# Patient Record
Sex: Female | Born: 1965 | Race: White | Hispanic: No | Marital: Married | Smoking: Former smoker
Health system: Southern US, Community
[De-identification: ages and names within clinical notes are randomized; demographics above are authoritative.]

## PROBLEM LIST (undated history)

## (undated) DIAGNOSIS — N950 Postmenopausal bleeding: Secondary | ICD-10-CM

## (undated) DIAGNOSIS — F411 Generalized anxiety disorder: Secondary | ICD-10-CM

## (undated) DIAGNOSIS — I471 Supraventricular tachycardia, unspecified: Secondary | ICD-10-CM

## (undated) DIAGNOSIS — R0609 Other forms of dyspnea: Secondary | ICD-10-CM

## (undated) DIAGNOSIS — Z9289 Personal history of other medical treatment: Secondary | ICD-10-CM

## (undated) DIAGNOSIS — Z7901 Long term (current) use of anticoagulants: Secondary | ICD-10-CM

## (undated) DIAGNOSIS — I10 Essential (primary) hypertension: Secondary | ICD-10-CM

## (undated) DIAGNOSIS — K760 Fatty (change of) liver, not elsewhere classified: Secondary | ICD-10-CM

## (undated) DIAGNOSIS — R7303 Prediabetes: Secondary | ICD-10-CM

## (undated) DIAGNOSIS — R112 Nausea with vomiting, unspecified: Secondary | ICD-10-CM

## (undated) DIAGNOSIS — M545 Low back pain, unspecified: Secondary | ICD-10-CM

## (undated) DIAGNOSIS — Z85828 Personal history of other malignant neoplasm of skin: Secondary | ICD-10-CM

## (undated) DIAGNOSIS — Z860101 Personal history of adenomatous and serrated colon polyps: Secondary | ICD-10-CM

## (undated) DIAGNOSIS — Z86010 Personal history of colonic polyps: Secondary | ICD-10-CM

## (undated) DIAGNOSIS — I480 Paroxysmal atrial fibrillation: Secondary | ICD-10-CM

## (undated) DIAGNOSIS — F329 Major depressive disorder, single episode, unspecified: Secondary | ICD-10-CM

## (undated) DIAGNOSIS — J439 Emphysema, unspecified: Secondary | ICD-10-CM

## (undated) DIAGNOSIS — R079 Chest pain, unspecified: Secondary | ICD-10-CM

## (undated) DIAGNOSIS — R600 Localized edema: Secondary | ICD-10-CM

## (undated) DIAGNOSIS — E041 Nontoxic single thyroid nodule: Secondary | ICD-10-CM

## (undated) DIAGNOSIS — K635 Polyp of colon: Secondary | ICD-10-CM

## (undated) DIAGNOSIS — F419 Anxiety disorder, unspecified: Secondary | ICD-10-CM

## (undated) DIAGNOSIS — Z9889 Other specified postprocedural states: Secondary | ICD-10-CM

## (undated) DIAGNOSIS — G8929 Other chronic pain: Secondary | ICD-10-CM

## (undated) DIAGNOSIS — R911 Solitary pulmonary nodule: Secondary | ICD-10-CM

## (undated) DIAGNOSIS — M1990 Unspecified osteoarthritis, unspecified site: Secondary | ICD-10-CM

## (undated) DIAGNOSIS — I5032 Chronic diastolic (congestive) heart failure: Secondary | ICD-10-CM

## (undated) DIAGNOSIS — F32A Depression, unspecified: Secondary | ICD-10-CM

## (undated) DIAGNOSIS — M4316 Spondylolisthesis, lumbar region: Secondary | ICD-10-CM

## (undated) DIAGNOSIS — I519 Heart disease, unspecified: Secondary | ICD-10-CM

## (undated) DIAGNOSIS — M5136 Other intervertebral disc degeneration, lumbar region: Secondary | ICD-10-CM

## (undated) DIAGNOSIS — G901 Familial dysautonomia [Riley-Day]: Secondary | ICD-10-CM

## (undated) DIAGNOSIS — M51369 Other intervertebral disc degeneration, lumbar region without mention of lumbar back pain or lower extremity pain: Secondary | ICD-10-CM

## (undated) DIAGNOSIS — C4491 Basal cell carcinoma of skin, unspecified: Secondary | ICD-10-CM

## (undated) HISTORY — PX: ENDOMETRIAL ABLATION: SHX621

## (undated) HISTORY — DX: Depression, unspecified: F32.A

## (undated) HISTORY — DX: Prediabetes: R73.03

## (undated) HISTORY — DX: Unspecified osteoarthritis, unspecified site: M19.90

## (undated) HISTORY — PX: TUBAL LIGATION: SHX77

## (undated) HISTORY — DX: Nontoxic single thyroid nodule: E04.1

## (undated) HISTORY — DX: Anxiety disorder, unspecified: F41.9

## (undated) HISTORY — DX: Chest pain, unspecified: R07.9

## (undated) HISTORY — DX: Heart disease, unspecified: I51.9

## (undated) HISTORY — DX: Personal history of other medical treatment: Z92.89

## (undated) HISTORY — DX: Polyp of colon: K63.5

## (undated) HISTORY — DX: Familial dysautonomia (riley-day): G90.1

## (undated) HISTORY — PX: OTHER SURGICAL HISTORY: SHX169

---

## 1998-08-06 HISTORY — PX: CARPAL TUNNEL RELEASE: SHX101

## 2000-12-17 ENCOUNTER — Emergency Department (HOSPITAL_COMMUNITY): Admission: EM | Admit: 2000-12-17 | Discharge: 2000-12-17 | Payer: Self-pay | Admitting: *Deleted

## 2000-12-17 ENCOUNTER — Encounter (INDEPENDENT_AMBULATORY_CARE_PROVIDER_SITE_OTHER): Payer: Self-pay | Admitting: *Deleted

## 2000-12-17 HISTORY — PX: REVISION AMPUTATION OF FINGER: SHX2346

## 2004-08-06 HISTORY — PX: ENDOMETRIAL ABLATION: SHX621

## 2005-02-23 ENCOUNTER — Ambulatory Visit (HOSPITAL_COMMUNITY): Admission: RE | Admit: 2005-02-23 | Discharge: 2005-02-23 | Payer: Self-pay | Admitting: Gynecology

## 2005-02-23 HISTORY — PX: HYSTEROSCOPY W/ ENDOMETRIAL ABLATION: SUR665

## 2005-05-24 ENCOUNTER — Ambulatory Visit: Payer: Self-pay | Admitting: Internal Medicine

## 2006-06-18 ENCOUNTER — Ambulatory Visit: Payer: Self-pay | Admitting: Internal Medicine

## 2007-06-24 ENCOUNTER — Ambulatory Visit: Payer: Self-pay | Admitting: Internal Medicine

## 2007-07-07 ENCOUNTER — Ambulatory Visit: Payer: Self-pay | Admitting: Gynecology

## 2007-07-07 ENCOUNTER — Encounter: Payer: Self-pay | Admitting: Obstetrics & Gynecology

## 2008-06-24 ENCOUNTER — Ambulatory Visit: Payer: Self-pay | Admitting: Internal Medicine

## 2008-08-04 ENCOUNTER — Encounter: Payer: Self-pay | Admitting: Family Medicine

## 2008-08-04 ENCOUNTER — Ambulatory Visit: Payer: Self-pay | Admitting: Family Medicine

## 2009-06-27 ENCOUNTER — Ambulatory Visit: Payer: Self-pay | Admitting: Internal Medicine

## 2010-05-16 ENCOUNTER — Ambulatory Visit: Payer: Self-pay | Admitting: Internal Medicine

## 2010-12-19 NOTE — Assessment & Plan Note (Signed)
NAME:  Victoria Williamson, Victoria Williamson              ACCOUNT NO.:  [id number]   MEDICAL RECORD NO.:  [id number]          PATIENT TYPE:  POB   LOCATION:  [facility] at [facility]         FACILITY:  [facility]   PHYSICIAN:  Tinnie Gens, MD        DATE OF BIRTH:  07/02/1966   DATE OF SERVICE:  08/04/2008                                  CLINIC NOTE   CHIEF COMPLAINT:  Physical exam.   HISTORY OF PRESENT ILLNESS:  The patient is a 45 year old gravida 3,  para 2-0-1-2, who comes in today for a physical exam.  Her last Pap  smear was in December 2008 and was normal.  The patient is status post  endometrial ablation 3 years ago.  She has not had a cycle since that  time.  The patient is also status post BTL.   PAST MEDICAL HISTORY:  Significant for hypertension, depression, and  questionably bipolar disease.   PAST SURGICAL HISTORY:  Endometrial ablation and BTL.   MEDICATIONS:  1. Abilify 1 p.o. daily.  2. Benicar 1 p.o. daily.  3. Mirtazapine 30 mg 1 p.o. nightly to help with sleep.   ALLERGIES:  None known.   SOCIAL HISTORY:  The patient works as Designer, industrial/product.  She smokes half a pack  per day.  Social alcohol use.   FAMILY HISTORY:  Breast cancer; paternal aunt, maternal aunt, and  maternal grandmother.  Brother has diverticulitis.  Father had coronary  artery disease and is deceased.  Mother has type 2 diabetes and  hypertension.   Preventive measures, the patient gets mammograms through her work, last  one being in November 2009 was normal.  The patient got cholesterol  checked several months ago with her primary care physician.   REVIEW OF SYSTEMS:  The patient reported dizzy spells along with crying  spells and currently being followed by her primary care Norbert Malkin.  She  denies chest pain, shortness of breath, headache, vision changes,  nausea, vomiting, diarrhea, constipation, blood in her stools, blood in  her urine, dysuria, swelling in her feet or ankles.  She does self-  breast checks and these  have been normal.   PHYSICAL EXAMINATION:  VITAL SIGNS:  Today, blood pressure is 125/82,  weight 168, and pulse 78.  GENERAL:  She is a well-developed, well-nourished female in no acute  distress.  HEENT:  Normocephalic and atraumatic.  Sclerae anicteric.  NECK:  Supple.  No lymphadenopathy.  LUNGS:  Clear bilaterally.  CVA:  Regular rate and rhythm.  No rubs, gallops, or murmurs.  ABDOMEN:  Soft, nontender, and nondistended.  EXTREMITIES:  No cyanosis, clubbing, or edema.  BREASTS:  Symmetric with everted nipples.  No masses.  No  supraclavicular or axillary adenopathy.  GU:  Normal external female genitalia.  BUS is normal.  Vagina is pink  and rugated.  Cervix is parous without lesion.  Uterus is small,  anteverted.  No adnexal mass or tenderness.   IMPRESSION:  Gynecologic exam with Pap smear.   PLAN:  1. Pap smear today.  2. Continue yearly mammograms.  3. Followup labs with PCP.  Follow up in 1 year for another Pap smear.  ______________________________  Tinnie Gens, MD     TP/MEDQ  D:  08/04/2008  T:  08/04/2008  Job:  [id number]

## 2010-12-19 NOTE — Assessment & Plan Note (Signed)
NAME:  Victoria Williamson, GRUNDEN              ACCOUNT NO.:  [id number]   MEDICAL RECORD NO.:  [id number]          PATIENT TYPE:  POB   LOCATION:  [facility]         FACILITY:  [facility]   PHYSICIAN:  Allie Bossier, MD        DATE OF BIRTH:  13-Nov-1965   DATE OF SERVICE:                                  CLINIC NOTE   [name] is a 45 year old married white gravida 3, para 2, abortus 1  with an LMP of July 2006 when she had an endometrial ablation.  She is  here for her physical exam.  She has no complaints.   PAST MEDICAL HISTORY:  Depression and she is a smoker with a pack-year  history.   PAST SURGICAL HISTORY:  Endometrial ablation July 2006 and tubal  ligation.   FAMILY HISTORY:  Positive breast cancer in paternal aunt and maternal  aunt, and maternal grandmother.   No known drug allergies.  No latex allergies.   SOCIAL HISTORY:  She smokes half pack a day for the last 24 years and  she denies drug use.  She rarely drinks alcohol.   PHYSICAL EXAM:  Weight 160 pounds, blood pressure 133/75, pulse 76.  HEENT:  Normal.  HEART:  Regular rate rhythm.  LUNGS:  Clear sensation bilaterally.  ABDOMEN:  Benign.  EXTERNAL GENITALIA:  Normal.  Cervix slightly stenotic.  Uterus is  normal size and shape, retroverted.  Minimal mobility.  Nodes nontender.  No adnexal masses.  Nontender.   ASSESSMENT/PLAN:  Annual exam.  She had a mammogram last month.  She is  getting a Pap smear today with cervical cultures.  Recommended self ball  breast exam and self vulvar exams monthly.  I have also recommended  smoking cessation.      Allie Bossier, MD     MCD/MEDQ  D:  07/07/2007  T:  07/07/2007  Job:  [id number]

## 2010-12-22 NOTE — Op Note (Signed)
[place]  Patient:    JOYCELYNN, FRITSCHE                  MRN: [id number] Proc. Date: 12/17/00 Adm. 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  OPERATION PERFORMED:  Revision amputation of left small finger through proximal interphalangeal joint with resection of traumatic neuromas and electrocautery of radial and ulnar proper digital arteries.  SURGEON:  Katy Fitch. Sypher, Montez Hageman., M.D.  ASSISTANT:  None.  ANESTHESIA:  0.5% Marcaine and 1% lidocaine metacarpal head level block supplemented by IV sedation.  SUPERVISING ANESTHESIOLOGIST:  Dr. Zoila Shutter.  INDICATIONS FOR PROCEDURE:  The patient is a 45 year old woman who earlier this evening was attempting to lead her horse onto a trailer.  The horse bolted and she had a lead rope wrapped around her small finger. This caused a traumatic twisting avulsion type injury to her left small finger disarticulating the PIP joint.  The only structure maintaining continuity with the finger was the flexor digitorum profundus tendon.  There was a skin bridge less than 2 mm in width.  The distal segment of the finger was ischemic with severe stretch injuries to the neurovascular bundles to the point of rupture of the neurovascular bundles.  A hand surgery consult was requested by Dr. Chase Picket, the attending emergency department physician.  Clinical examination revealed the untidy avulsion amputation at the level of the PIP joint.  We recommended proceeding to revision  amputation.  This injury did not meet any criteria for replantation.  Preoperatively, Ms. Eads was noted to be intolerant to hydrocodone and Demerol.  She was on no current medications and had no other serious medical problems.  DESCRIPTION OF PROCEDURE:  Lekha Kirra Verga was brought to the operating room and placed in supine position on the operating table.  Following light sedation with Versed and fentanyl, the left arm was prepped with Betadine soap and solution and sterilely draped.  A pneumatic tourniquet was applied to the proximal brachium.  0.5% Marcaine and 1% lidocaine were infiltrated without epinephrine at the base of the finger to obtain a digital block.  When anesthesia was satisfactory, the arm was prepped with Betadine soap and solution and sterilely draped.   The procedure was completed without a tourniquet due to the absence of bleeding due to the avulsion nature of the injury.  Skin margins were sharply debrided followed by orderly identification of the radial and ulnar proper digital nerves.  These were transected with a sharp 15 scalpel blade and allowed to retract within the fat.  The proper digital arteries were identified and electrocauterized.  Large caliber veins along the skin margin were likewise electrocauterized.  The head of the proximal phalanx was denuded of hyaline articular cartilage and rounded to a bullet shape with a rongeur. The remaining skin flaps which was long on the dorsal radial side of the finger were used to reconstruct the fingertip with a radial side based flap.  The MP  joint was then injured.  There were minor abrasions on the ring finger and index finger which required simple cleansing and dressing with Xeroflo.  A voluminous gauze hand dressing was applied with fluff gauze, Kerlix and Ace wrap.  A Coban overwrap was applied to maintain a stable dressing.  There were no apparent complications.  The patient tolerated the surgery  and anesthesia well and was transferred to the recovery room with stable vital signs.  She will be discharged to the care of her family with prescriptions for Dilaudid 2 mg 1 or 2 tablets p.o. q.4-6h. p.r.n. pain total of 30 tablets without refill, also Motrin 600 mg  1 p.o. q.6h. p.r.n. pain 20 tablets with one refill and Keflex 500 mg 1 p.o. q.8h.  x 4 days as a prophylactic antibiotic. DD:  12/17/00 TD:  12/18/00 Job: [id number]

## 2010-12-22 NOTE — H&P (Signed)
Victoria Williamson, Victoria Williamson              ACCOUNT NO.:  [id number]   MEDICAL RECORD NO.:  [id number]          PATIENT TYPE:  AMB   LOCATION:  [facility]                           FACILITY:  [facility]   PHYSICIAN:  Ginger Carne, MD  DATE OF BIRTH:  July 28, 1966   DATE OF ADMISSION:  DATE OF DISCHARGE:                                HISTORY & PHYSICAL   REASON FOR ADMISSION:  Menometrorrhagia.   IN-HOSPITAL PROCEDURES:  NovaSure endometrial ablation and hysteroscopy.   HISTORY OF PRESENT ILLNESS:  This is a 45 year old gravida 3, para 2-0-1-2,  Caucasian female admitted for the aforementioned procedure because of  menometrorrhagia.  The patient bleeds 7-10 days out a 30-day cycle with  bleeding that has restricted her ability to perform her job.  The patient  states that she feels dizzy at times in addition to having to leave work  early.  The patient takes no medications to enhance her bleeding propensity  and has no personal family history of bleeding diatheses.  She is a smoker  and age 46 which does not make her a candidate for management with oral  contraceptive agents.   A transvaginal ultrasound with SIS reveals no evidence of intrauterine  pathology.  The endometrial biopsy returned as a secretory endometrium  without evidence of hyperplasia or neoplasia.   OB/GYN HISTORY:  The patient has had two full-term vaginal deliveries and a  first trimester incomplete abortion.   PAST MEDICAL HISTORY:  The patient has a history of depression.   PAST SURGICAL HISTORY:  Negative.   ALLERGIES:  HYDROCODONE.   CURRENT MEDICATIONS:  Symbyax 6/25 at night.   FAMILY HISTORY:  Mother has type 2 diabetes and hypertension, father has had  a myocardial infarction and is deceased from coronary artery heart disease,  and her brother has diverticulitis.   SOCIAL HISTORY:  She smokes about half a pack of cigarettes daily.  Denies  illicit drug or alcohol abuse.   REVIEW OF SYSTEMS:  Negative.   PHYSICAL EXAMINATION:  VITAL SIGNS:  Blood pressure 110/80, height 5 feet 5  inches, weight 158 pounds.  HEENT:  Grossly normal.  BREASTS:  Without masses, discharge, thickenings or tenderness.  CHEST:  Clear to percussion and auscultation.  CARDIOVASCULAR:  Without murmurs or enlargement.  Regular rate and rhythm.  EXTREMITIES:  Within normal limits.  LYMPHATICS:  Within normal limits.  SKIN:  Within normal limits.  NEUROLOGIC:  Within normal limits.  MUSCULOSKELETAL:  Within normal limits.  ABDOMEN:  Soft, without gross hepatosplenomegaly.  PELVIC:  Normal Pap smear.  External genitalia, vulva and vagina normal.  Cervix smooth without erosions or lesions.  Both adnexa palpable and found  to be normal.  Uterus normal in size.  RECTAL:  Hemoccult negative without masses.   IMPRESSION:  Menometrorrhagia.   PLAN:  The patient declined use of an intrauterine device or the use of  Norlutate with her menses.  She is not a candidate for oral contraceptives  due to being a smoker and over the age of 71.  For this reason, the patient  opted and agreed to a  NovaSure endometrial ablation with a hysteroscopy in  addition.  She has had a tubal ligation in the past and understands that the  nature of this procedure carries a 50% amenorrhea rate.  All questions  answered to the satisfaction of said patient and all instructions  understood.  The risks and benefits discussed in detail with the patient.       SHB/MEDQ  D:  02/22/2005  T:  02/22/2005  Job:  [id number]

## 2010-12-22 NOTE — Consult Note (Signed)
Victoria Williamson. [facility]  Patient:    Victoria Williamson, Victoria Williamson                  MRN: [id number] Proc. Date: 12/17/00 Adm. 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  She was leading a horse onto a trailer earlier this evening at which time the horse bolted and the lead rope she was handling the horse with became entangled around her small finger causing avulsion injury to the small finger. She sustained an avulsion of her left small finger through the PIP joint with the only remaining structure, the flexor digitorum profundus tendon and a 2 mm skin bridge.  The neurovascular bundles were ruptured and the distal segment was ischemic.  She was brought by EMS to the emergency department where she was evaluated by Dr. Chase Picket, attending emergency room physician.  He called for hand surgery consult.  [name] past medical history is reviewed in detail.  She had intolerance to hydrocodone and Demerol.  She was on no current medications.  She had no serious illnesses such as diabetes, thyroid disease or hypertension.  There was no history of coronary artery disease.  PHYSICAL EXAMINATION:  VITAL SIGNS:  Temperature 98.7, blood pressure 119/87, pulse 78 regular, respiratory rate 12.  Her oxygen saturation was 98% on room air.  Physical examination revealed an acutely traumatized 45 year old woman.  HEENT:  Clear.  CHEST:  Clear to auscultation.  HEART:  S1, S2, no murmur, gallops appreciated.  ABDOMEN:  Grossly normal.  ORTHOPEDIC EXAM:  Gait and stance were not tested.  Her upper extremities were unremarkable except for the left hand which had avulsion amputation of the left small finger through the PIP joint.   She had an abrasion of the dorsal radialis index middle phalangeal segment.  She also had numerous abrasions to her ring finger.  Her ring finger was markedly swollen with a wide wedding band in position at the base of the proximal phalanx.  Her legs were uninjured.  The distal segment of the small finger was ischemic.  The remainder of her orthopedic exam was unremarkable.  NEUROLOGIC:  She was awake and oriented x 3.  There was no sign of head trauma.  She was PERRLA.  She had normal motor and sensory function of all four extremities except for her injured small finger.  LABORATORY DATA:  CBC, white count 9600,  hemoglobin 13.1gm%, platelet count 280,000.  Differential was unremarkable.  There were no old medical  records.  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  PLAN: Our intention was to transfer her immediately to the operating room for revision amputation of the left small finger through the PIP joint.  She did not meet replantation criteria with an avulsion type injury.  We discussed the issues of [place] which would occur with a stiff small finger.  In addition, issues such as cold intolerance were discussed.  With respect with her wedding band, we recommended a trial of a compression wrapping of the finger followed  by removal with lubricant.  If this is unsuccessful, we anticipate removal of the ring with a ring cutter.  Once we entered the operating suite and a digital block was placed, an attempted removal of her ring was unsuccessful.  I did use a ring cutter to remove it and this was placed in a plastic bag and transferred to the custody of her husband.  Questions regarding the anticipated surgery were invited and answered. DD:  12/17/00 TD:  12/18/00 Job: [id number]

## 2010-12-22 NOTE — Op Note (Signed)
NAMEEVALEEN, SANT              ACCOUNT NO.:  [id number]   MEDICAL RECORD NO.:  [id number]          PATIENT TYPE:  AMB   LOCATION:  [facility]                           FACILITY:  [facility]   PHYSICIAN:  Ginger Carne, MD  DATE OF BIRTH:  [birth year]   DATE OF PROCEDURE:  02/23/2005  DATE OF DISCHARGE:                                 OPERATIVE REPORT   PREOPERATIVE DIAGNOSIS:  Menorrhagia.   POSTOPERATIVE DIAGNOSIS:  Menorrhagia.   PROCEDURE:  Hysteroscopy with NovaSure endometrial ablation.   SURGEON:  Dr. Mia Creek.   ASSISTANT:  None.   COMPLICATIONS:  None immediate.   ESTIMATED BLOOD LOSS:  Minimal.   ANESTHESIA:  General.   SPECIMEN:  None.   OPERATIVE FINDINGS:  External genitalia, vulva, vagina normal. Cervix smooth  without erosions or lesions. Uterus sounded to 9 cm with a 3 cm endocervical  canal. Hysteroscopic evaluation revealed no evidence of intracavitary  polyps, fibroids or other abnormalities. The patient has had a previous  endometrial biopsy with an SIS excluding any neoplasia or hyperplasia.   OPERATIVE PROCEDURE:  The patient was prepped and draped in the usual  fashion and placed in the lithotomy position. Betadine solution was used for  antiseptic and the patient was catheterized prior to the procedure. After  adequate general anesthesia, the tenaculum was placed on the anterior lip of  the cervix sounding followed by dilatation to accommodate a hysteroscope was  then followed by inspection of the intrauterine cavity and endocervical  canal lactated Ringer's utilized. Afterwards, the hysteroscope was removed  and the NovaSure endometrial ablator was utilized. All measurements recorded  and in the chart. At the end of the procedure no active bleeding noted. The  patient tolerated the procedure well and went to the post anesthesia  recovery room in excellent condition.      SHB/MEDQ  D:  02/23/2005  T:  02/23/2005  Job:  [id number]

## 2011-05-17 ENCOUNTER — Ambulatory Visit: Payer: Self-pay | Admitting: Internal Medicine

## 2012-05-21 ENCOUNTER — Ambulatory Visit: Payer: Self-pay | Admitting: Internal Medicine

## 2013-06-03 ENCOUNTER — Ambulatory Visit: Payer: Self-pay | Admitting: Cardiology

## 2013-06-03 HISTORY — PX: CARDIAC CATHETERIZATION: SHX172

## 2013-12-14 ENCOUNTER — Encounter: Payer: Self-pay | Admitting: *Deleted

## 2013-12-18 ENCOUNTER — Ambulatory Visit (INDEPENDENT_AMBULATORY_CARE_PROVIDER_SITE_OTHER): Payer: BC Managed Care – PPO | Admitting: Internal Medicine

## 2013-12-18 ENCOUNTER — Encounter: Payer: Self-pay | Admitting: Internal Medicine

## 2013-12-18 VITALS — BP 158/94 | HR 76 | Ht 65.0 in | Wt 160.0 lb

## 2013-12-18 DIAGNOSIS — G909 Disorder of the autonomic nervous system, unspecified: Secondary | ICD-10-CM

## 2013-12-18 DIAGNOSIS — R42 Dizziness and giddiness: Secondary | ICD-10-CM

## 2013-12-18 DIAGNOSIS — G901 Familial dysautonomia [Riley-Day]: Secondary | ICD-10-CM | POA: Insufficient documentation

## 2013-12-18 HISTORY — DX: Familial dysautonomia (riley-day): G90.1

## 2013-12-18 NOTE — Patient Instructions (Signed)
[employer].es  Increase SALT intake  Increase FlUID intake.

## 2013-12-18 NOTE — Progress Notes (Signed)
ELECTROPHYSIOLOGY CONSULT NOTE  Patient ID: Victoria Williamson, MRN: [id number], DOB/AGE: Feb 27, 1966 48 y.o. Admit date: (Not on file) Date of Consult: 12/18/2013  Primary Physician: No primary provider on file. Primary Cardiologist: noe  Chief Complaint: syncope   HPI Victoria Williamson is a 48 y.o. female  Referred for consideration of tilt tablel testing. She is a 3-to four-year history of spells there become increasingly frequent. She has been treated for hypertension for the same period of time and after these spells began to worsen, the antihypertensives were discontinued. She had one episode of syncope but many episodes of presyncope.  There are stereotypical. There is characterized by a tinnitus followed by nausea lightheadedness and associated with recovery fatigue. She is described as pale. On one occasion, recorder her blood pressure just prior to her loss of consciousness and it was 65/35.  She is salt replete as well as fluid deplete. Her urine color is quite yellow. She denies heat intolerance or shower intolerance.  Dr. Sabra Heck has made a presumptive diagnosis of vasovagal syncope. He suggested the possible use of an SSRI.  Cardiac evaluation has included a catheterization because of atypical chest pain. This was normal. October 2014.      Past Medical History  Diagnosis Date  . Anxiety   . Chest pain     normal coronary arteries by cardiac cath oct 29,2014  . Dysautonomia 12/18/2013      Surgical History:  Past Surgical History  Procedure Laterality Date  . Right carpal tunel    . Btl    . Endometrial ablation       Home Meds: Prior to Admission medications   Medication Sig Start Date End Date Taking? Authorizing Provider  ARIPiprazole (ABILIFY) 10 MG tablet Take 5 mg by mouth daily. 1/2 tab daily   Yes Historical Provider, MD  mirtazapine (REMERON) 15 MG tablet Take 15 mg by mouth at bedtime.   Yes Historical Provider, MD     Allergies:    Allergies  Allergen Reactions  . Codeine Sulfate   . Paxil [Paroxetine Hcl]     History   Social History  . Marital Status: Married    Spouse Name: N/A    Number of Children: N/A  . Years of Education: N/A   Occupational History  . Not on file.   Social History Main Topics  . Smoking status: Current Every Day Smoker  . Smokeless tobacco: Not on file  . Alcohol Use: Not on file  . Drug Use: Not on file  . Sexual Activity: Not on file   Other Topics Concern  . Not on file   Social History Narrative  . No narrative on file     Family History  Problem Relation Age of Onset  . Heart attack Father   . Diabetes Other     family HX, unspecified  . Heart disease Mother   . Diabetes Mother   . COPD Mother   . Hypertension Mother      ROS:  Please see the history of present illness.     All other systems reviewed and negative.    Physical Exam   Blood pressure 158/94, pulse 76, height 5\' 5"  (1.651 m), weight 160 lb (72.576 kg). General: Well developed, well nourished female in no acute distress. Head: Normocephalic, atraumatic, sclera non-icteric, no xanthomas, nares are without discharge. EENT: normal Lymph Nodes:  none Back: without scoliosis/kyphosis, no CVA tendersness Neck: Negative for carotid bruits. JVD not elevated. Lungs:  Clear bilaterally to auscultation without wheezes, rales, or rhonchi. Breathing is unlabored. Heart: RRR with S1 S2. 2/6 systoliv murmur ,+S4   Abdomen: Soft, non-tender, non-distended with normoactive bowel sounds. No hepatomegaly. No rebound/guarding. No obvious abdominal masses. Msk:  Strength and tone appear normal for age. Extremities: No clubbing or cyanosis. No*  edema.  Distal pedal pulses are 2+ and equal bilaterally. Skin: Warm and Dry Neuro: Alert and oriented X 3. CN III-XII intact Grossly normal sensory and motor function . Psych:  Responds to questions appropriately with a normal affect.      Labs:  Radiology/Studies:   No results found.  EKG: *NSR 73 15/07/38   Assessment and Plan:  Neurally mediated hypotension  Hypertension  Alcohol/marijuana use  The patient has recurrent episodes of stereotypical lightheadedness accompanied by epiphenomena consistent with a neurally mediated syndrome.  That she was able to record her blood pressure during one of these episodes of 65/35 I think obviously need for tilt table testing. I don't know that would add diagnostic certainty.  Present a long time however reviewing the physiology of neurally mediated syndromes. We discussed particularly the importance of fluid repletion, the potential triggering effects of marijuana and alcohol, and given her hypertension the need for her to decrease her sodium intake.  Dr. Sabra Heck has already gone over with her extensively intervening behaviors to try to interrupt spells.  The initially in treating data with the use of SSRIs have not been confirmed with further studies. I suggested that she avoid the SSRI  I've also given her the information for the NDRF.org website    Deboraha Sprang

## 2013-12-20 DIAGNOSIS — F32A Depression, unspecified: Secondary | ICD-10-CM | POA: Insufficient documentation

## 2013-12-20 DIAGNOSIS — F419 Anxiety disorder, unspecified: Secondary | ICD-10-CM | POA: Insufficient documentation

## 2013-12-25 DIAGNOSIS — R55 Syncope and collapse: Secondary | ICD-10-CM | POA: Insufficient documentation

## 2014-05-19 DIAGNOSIS — I10 Essential (primary) hypertension: Secondary | ICD-10-CM | POA: Insufficient documentation

## 2014-05-19 DIAGNOSIS — K219 Gastro-esophageal reflux disease without esophagitis: Secondary | ICD-10-CM | POA: Insufficient documentation

## 2014-05-25 ENCOUNTER — Ambulatory Visit: Payer: Self-pay | Admitting: Internal Medicine

## 2015-06-02 ENCOUNTER — Other Ambulatory Visit: Payer: Self-pay | Admitting: Internal Medicine

## 2015-06-02 DIAGNOSIS — Z1231 Encounter for screening mammogram for malignant neoplasm of breast: Secondary | ICD-10-CM

## 2015-06-07 ENCOUNTER — Ambulatory Visit
Admission: RE | Admit: 2015-06-07 | Discharge: 2015-06-07 | Disposition: A | Discharge status: Home / Self Care | Payer: BLUE CROSS/BLUE SHIELD | Source: Ambulatory Visit | Attending: Internal Medicine | Admitting: Internal Medicine

## 2015-06-07 DIAGNOSIS — Z1231 Encounter for screening mammogram for malignant neoplasm of breast: Secondary | ICD-10-CM | POA: Diagnosis not present

## 2016-05-29 ENCOUNTER — Other Ambulatory Visit: Payer: Self-pay | Admitting: Internal Medicine

## 2016-05-29 DIAGNOSIS — Z1231 Encounter for screening mammogram for malignant neoplasm of breast: Secondary | ICD-10-CM

## 2016-06-13 ENCOUNTER — Ambulatory Visit
Admission: RE | Admit: 2016-06-13 | Discharge: 2016-06-13 | Disposition: A | Discharge status: Home / Self Care | Payer: BLUE CROSS/BLUE SHIELD | Source: Ambulatory Visit | Attending: Internal Medicine | Admitting: Internal Medicine

## 2016-06-13 DIAGNOSIS — Z1231 Encounter for screening mammogram for malignant neoplasm of breast: Secondary | ICD-10-CM | POA: Diagnosis not present

## 2018-01-14 DIAGNOSIS — E538 Deficiency of other specified B group vitamins: Secondary | ICD-10-CM | POA: Insufficient documentation

## 2018-06-17 ENCOUNTER — Other Ambulatory Visit: Payer: Self-pay | Admitting: Internal Medicine

## 2018-06-17 ENCOUNTER — Other Ambulatory Visit (HOSPITAL_COMMUNITY): Payer: Self-pay | Admitting: Internal Medicine

## 2018-06-17 DIAGNOSIS — Z1231 Encounter for screening mammogram for malignant neoplasm of breast: Secondary | ICD-10-CM

## 2018-06-25 ENCOUNTER — Ambulatory Visit
Admission: RE | Admit: 2018-06-25 | Discharge: 2018-06-25 | Disposition: A | Discharge status: Home / Self Care | Payer: BLUE CROSS/BLUE SHIELD | Source: Ambulatory Visit | Attending: Internal Medicine | Admitting: Internal Medicine

## 2018-06-25 DIAGNOSIS — Z1231 Encounter for screening mammogram for malignant neoplasm of breast: Secondary | ICD-10-CM | POA: Insufficient documentation

## 2018-07-28 ENCOUNTER — Other Ambulatory Visit
Admission: RE | Admit: 2018-07-28 | Discharge: 2018-07-28 | Disposition: A | Discharge status: Home / Self Care | Payer: BLUE CROSS/BLUE SHIELD | Source: Ambulatory Visit | Attending: Student | Admitting: Student

## 2018-07-28 DIAGNOSIS — K529 Noninfective gastroenteritis and colitis, unspecified: Secondary | ICD-10-CM | POA: Insufficient documentation

## 2018-07-28 LAB — GASTROINTESTINAL PANEL BY PCR, STOOL (REPLACES STOOL CULTURE)

## 2018-07-28 LAB — C DIFFICILE QUICK SCREEN W PCR REFLEX
C DIFFICILE (CDIFF) TOXIN: NEGATIVE
C DIFFICLE (CDIFF) ANTIGEN: NEGATIVE
C Diff interpretation: NOT DETECTED

## 2018-07-29 LAB — CALPROTECTIN, FECAL: Calprotectin, Fecal: <16 ug/g (ref 0–120)

## 2018-08-04 LAB — PANCREATIC ELASTASE, FECAL: Pancreatic Elastase-1, Stool: >500 ug Elast./g (ref >200)

## 2018-08-06 HISTORY — PX: COLONOSCOPY: SHX174

## 2018-08-22 DIAGNOSIS — D369 Benign neoplasm, unspecified site: Secondary | ICD-10-CM | POA: Insufficient documentation

## 2019-01-16 DIAGNOSIS — M5416 Radiculopathy, lumbar region: Secondary | ICD-10-CM | POA: Insufficient documentation

## 2019-09-07 ENCOUNTER — Other Ambulatory Visit: Payer: Self-pay | Admitting: Internal Medicine

## 2019-09-07 DIAGNOSIS — Z1231 Encounter for screening mammogram for malignant neoplasm of breast: Secondary | ICD-10-CM

## 2019-09-07 DIAGNOSIS — I480 Paroxysmal atrial fibrillation: Secondary | ICD-10-CM

## 2019-09-07 HISTORY — DX: Paroxysmal atrial fibrillation: I48.0

## 2019-09-24 ENCOUNTER — Other Ambulatory Visit: Payer: Self-pay

## 2019-09-24 ENCOUNTER — Emergency Department (HOSPITAL_COMMUNITY): Payer: BC Managed Care – PPO

## 2019-09-24 ENCOUNTER — Encounter (HOSPITAL_COMMUNITY): Payer: Self-pay | Admitting: *Deleted

## 2019-09-24 ENCOUNTER — Emergency Department (HOSPITAL_COMMUNITY)
Admission: EM | Admit: 2019-09-24 | Discharge: 2019-09-24 | Disposition: A | Discharge status: Home / Self Care | Payer: BC Managed Care – PPO | Attending: Emergency Medicine | Admitting: Emergency Medicine

## 2019-09-24 DIAGNOSIS — R0602 Shortness of breath: Secondary | ICD-10-CM | POA: Insufficient documentation

## 2019-09-24 DIAGNOSIS — R7989 Other specified abnormal findings of blood chemistry: Secondary | ICD-10-CM | POA: Insufficient documentation

## 2019-09-24 DIAGNOSIS — F17210 Nicotine dependence, cigarettes, uncomplicated: Secondary | ICD-10-CM | POA: Diagnosis not present

## 2019-09-24 DIAGNOSIS — R000 Tachycardia, unspecified: Secondary | ICD-10-CM | POA: Diagnosis present

## 2019-09-24 DIAGNOSIS — I10 Essential (primary) hypertension: Secondary | ICD-10-CM | POA: Diagnosis not present

## 2019-09-24 DIAGNOSIS — R0789 Other chest pain: Secondary | ICD-10-CM | POA: Diagnosis not present

## 2019-09-24 DIAGNOSIS — E876 Hypokalemia: Secondary | ICD-10-CM | POA: Diagnosis not present

## 2019-09-24 DIAGNOSIS — I4891 Unspecified atrial fibrillation: Secondary | ICD-10-CM | POA: Insufficient documentation

## 2019-09-24 HISTORY — DX: Essential (primary) hypertension: I10

## 2019-09-24 LAB — CBC
HCT: 41.1 % (ref 36.0–46.0)
Hemoglobin: 13.3 g/dL (ref 12.0–15.0)
MCH: 32.1 pg (ref 26.0–34.0)
MCHC: 32.4 g/dL (ref 30.0–36.0)
MCV: 99.3 fL (ref 80.0–100.0)
Platelets: 296 10*3/uL (ref 150–400)
RBC: 4.14 MIL/uL (ref 3.87–5.11)
RDW: 12.8 % (ref 11.5–15.5)
WBC: 16.3 10*3/uL — ABNORMAL HIGH (ref 4.0–10.5)
nRBC: 0 % (ref 0.0–0.2)

## 2019-09-24 LAB — BASIC METABOLIC PANEL
Anion gap: 11 (ref 5–15)
BUN: 10 mg/dL (ref 6–20)
CO2: 24 mmol/L (ref 22–32)
Calcium: 8.8 mg/dL — ABNORMAL LOW (ref 8.9–10.3)
Chloride: 106 mmol/L (ref 98–111)
Creatinine, Ser: 0.91 mg/dL (ref 0.44–1.00)
GFR calc Af Amer: >60 mL/min (ref >60)
GFR calc non Af Amer: >60 mL/min (ref >60)
Glucose, Bld: 114 mg/dL — ABNORMAL HIGH (ref 70–99)
Potassium: 3.2 mmol/L — ABNORMAL LOW (ref 3.5–5.1)
Sodium: 141 mmol/L (ref 135–145)

## 2019-09-24 LAB — TSH: TSH: 7.855 u[IU]/mL — ABNORMAL HIGH (ref 0.350–4.500)

## 2019-09-24 LAB — I-STAT BETA HCG BLOOD, ED (MC, WL, AP ONLY): I-stat hCG, quantitative: <5 m[IU]/mL (ref <5)

## 2019-09-24 LAB — BRAIN NATRIURETIC PEPTIDE: B Natriuretic Peptide: 81.2 pg/mL (ref 0.0–100.0)

## 2019-09-24 LAB — T4, FREE: Free T4: 0.86 ng/dL (ref 0.61–1.12)

## 2019-09-24 MED ORDER — SODIUM CHLORIDE 0.9% FLUSH
3.0000 mL | Freq: Once | INTRAVENOUS | Status: AC
  Administered 2019-09-24: 3 mL via INTRAVENOUS

## 2019-09-24 MED ORDER — POTASSIUM CHLORIDE CRYS ER 20 MEQ PO TBCR
40.0000 meq | EXTENDED_RELEASE_TABLET | Freq: Once | ORAL | Status: AC
  Administered 2019-09-24: 40 meq via ORAL
  Filled 2019-09-24: qty 2

## 2019-09-24 MED ORDER — POTASSIUM CHLORIDE 10 MEQ/100ML IV SOLN
10.0000 meq | Freq: Once | INTRAVENOUS | Status: AC
  Administered 2019-09-24: 03:00:00 10 meq via INTRAVENOUS
  Filled 2019-09-24: qty 100

## 2019-09-24 MED ORDER — SODIUM CHLORIDE 0.9 % IV BOLUS
1000.0000 mL | Freq: Once | INTRAVENOUS | Status: AC
  Administered 2019-09-24: 02:00:00 1000 mL via INTRAVENOUS

## 2019-09-24 NOTE — ED Provider Notes (Signed)
[facility] EMERGENCY DEPARTMENT Provider Note  CSN: [id number] Arrival date & time: 09/24/19 [id number]  Chief Complaint(s) Atrial Fibrillation  HPI Victoria Williamson is a 54 y.o. female with a past medical history listed below who presents to the emergency department with sudden onset tachycardia with palpitations.  Patient reports that symptoms began just after 10 PM.  She had associated chest burning mild shortness of breath.  Denies any recent fevers or infections.  No alleviating or aggravating factors.  Initially has some nausea but that has resolved.  No emesis.  No abdominal pain.  No diarrhea.  No urinary symptoms.  Denies history of atrial fibrillation or cardiac issues.  Called EMS and found to be in A. fib RVR with rates in the 180s.  Patient placed on diltiazem but taken off due to soft blood pressures.  She was given 500 cc of IV fluids.  HPI  Just after 10p Past Medical History Past Medical History:  Diagnosis Date  . Anxiety   . Chest pain    normal coronary arteries by cardiac cath oct 29,2014  . Dysautonomia ([place]) 12/18/2013  . Hypertension    Patient Active Problem List   Diagnosis Date Noted  . Dysautonomia ([place]) 12/18/2013   Home Medication(s) Prior to Admission medications   Medication Sig Start Date End Date Taking? Authorizing Provider  ARIPiprazole (ABILIFY) 10 MG tablet Take 5 mg by mouth daily.    Yes [provider]  escitalopram (LEXAPRO) 10 MG tablet Take 10 mg by mouth daily. 07/24/19  Yes [provider]  losartan (COZAAR) 100 MG tablet Take 100 mg by mouth at bedtime.  06/22/19  Yes [provider]  mirtazapine (REMERON) 15 MG tablet Take 15 mg by mouth at bedtime.   Yes [provider]                                                                                                                                    Past Surgical History Past Surgical History:  Procedure Laterality Date  . btl     . ENDOMETRIAL ABLATION    . right carpal tunel     Family History Family History  Problem Relation Age of Onset  . Heart attack Father   . Heart disease Mother   . Diabetes Mother   . COPD Mother   . Hypertension Mother   . Diabetes Other        family HX, unspecified  . Breast cancer Maternal Aunt 64  . Breast cancer Maternal Grandmother 92    Social History Social History   Tobacco Use  . Smoking status: Current Every Day Smoker  Substance Use Topics  . Alcohol use: Not on file  . Drug use: Not on file   Allergies Codeine, Codeine sulfate, and Paroxetine hcl  Review of Systems Review of Systems All other systems are reviewed and are negative for acute change except  as noted in the HPI  Physical Exam Vital Signs  I have reviewed the triage vital signs BP (!) 138/107 (BP Location: Left Arm)   Pulse (!) 177   Temp 98.8 F (37.1 C)   Resp 20   SpO2 96%   Physical Exam Vitals reviewed.  Constitutional:      General: She is not in acute distress.    Appearance: She is well-developed. She is not diaphoretic.  HENT:     Head: Normocephalic and atraumatic.     Nose: Nose normal.  Eyes:     General: No scleral icterus.       Right eye: No discharge.        Left eye: No discharge.     Conjunctiva/sclera: Conjunctivae normal.     Pupils: Pupils are equal, round, and reactive to light.  Cardiovascular:     Rate and Rhythm: Tachycardia present. Rhythm irregularly irregular.     Heart sounds: No murmur. No friction rub. No gallop.   Pulmonary:     Effort: Pulmonary effort is normal. No respiratory distress.     Breath sounds: Normal breath sounds. No stridor. No rales.  Abdominal:     General: There is no distension.     Palpations: Abdomen is soft.     Tenderness: There is no abdominal tenderness.  Musculoskeletal:        General: No tenderness.     Cervical back: Normal range of motion and neck supple.  Skin:    General: Skin is warm and dry.      Findings: No erythema or rash.  Neurological:     Mental Status: She is alert and oriented to person, place, and time.     ED Results and Treatments Labs (all labs ordered are listed, but only abnormal results are displayed) Labs Reviewed  BASIC METABOLIC PANEL - Abnormal; Notable for the following components:      Result Value   Potassium 3.2 (*)    Glucose, Bld 114 (*)    Calcium 8.8 (*)    All other components within normal limits  CBC - Abnormal; Notable for the following components:   WBC 16.3 (*)    All other components within normal limits  TSH - Abnormal; Notable for the following components:   TSH 7.855 (*)    All other components within normal limits  BRAIN NATRIURETIC PEPTIDE  T4, FREE  I-STAT BETA HCG BLOOD, ED ([facility], [facility], [facility] ONLY)                                                                                                                         EKG  EKG Interpretation  Date/Time:  Thursday September 24 2019 01:55:09 EST Ventricular Rate:  163 PR Interval:    QRS Duration: 90 QT Interval:  280 QTC Calculation: 462 R Axis:   83 Text Interpretation: Atrial fibrillation with rapid V-rate ST depression, probably rate related Confirmed by Addison Lank [phone]) on 09/24/2019 2:18:51 AM  EKG Interpretation  Date/Time:  Thursday September 24 2019 03:20:12 EST Ventricular Rate:  82 PR Interval:    QRS Duration: 85 QT Interval:  355 QTC Calculation: 415 R Axis:   77 Text Interpretation: Sinus rhythm Baseline wander in lead(s) V3 V4 Atrial fibrillation RESOLVED SINCE PREVIOUS Confirmed by Addison Lank [phone]) on 09/24/2019 4:01:29 AM       Radiology DG Chest Port 1 View  Result Date: 09/24/2019 CLINICAL DATA:  Shortness of breath. EXAM: PORTABLE CHEST 1 VIEW COMPARISON:  None. FINDINGS: There are few scattered airspace opacities at the lung bases favored to represent atelectasis. There is no pneumothorax. No large pleural effusion. Heart size is normal.  There is no acute osseous abnormality. IMPRESSION: No active disease. Electronically Signed   By: Constance Holster M.D.   On: 09/24/2019 02:17    Pertinent labs & imaging results that were available during my care of the patient were reviewed by me and considered in my medical decision making (see chart for details).  Medications Ordered in ED Medications  sodium chloride flush (NS) 0.9 % injection 3 mL (3 mLs Intravenous Given 09/24/19 0157)  sodium chloride 0.9 % bolus 1,000 mL (0 mLs Intravenous Stopped 09/24/19 0356)  potassium chloride 10 mEq in 100 mL IVPB (0 mEq Intravenous Stopped 09/24/19 0356)  potassium chloride SA (KLOR-CON) CR tablet 40 mEq (40 mEq Oral Given 09/24/19 0243)                                                                                                                                    Procedures .Critical Care Performed by: Fatima Blank, MD Authorized by: Fatima Blank, MD    CRITICAL CARE Performed by: Grayce Sessions Lenford Beddow Total critical care time: 50 minutes Critical care time was exclusive of separately billable procedures and treating other patients. Critical care was necessary to treat or prevent imminent or life-threatening deterioration. Critical care was time spent personally by me on the following activities: development of treatment plan with patient and/or surrogate as well as nursing, discussions with consultants, evaluation of patient's response to treatment, examination of patient, obtaining history from patient or surrogate, ordering and performing treatments and interventions, ordering and review of laboratory studies, ordering and review of radiographic studies, pulse oximetry and re-evaluation of patient's condition.    (including critical care time)  Medical Decision Making / ED Course I have reviewed the nursing notes for this encounter and the patient's prior records (if available in EHR or on provided  paperwork).   Keslee Yaslyn Mahrt was evaluated in Emergency Department on 09/24/2019 for the symptoms described in the history of present illness. She was evaluated in the context of the global COVID-19 pandemic, which necessitated consideration that the patient might be at risk for infection with the SARS-CoV-2 virus that causes COVID-19. Institutional protocols and algorithms that pertain to the evaluation of patients at risk for COVID-19 are in a  state of rapid change based on information released by regulatory bodies including the CDC and federal and state organizations. These policies and algorithms were followed during the patient's care in the ED.  Patient presents with new onset A. fib RVR. No recent infectious symptoms. Work-up notable for mild hypokalemia which was repleted by IV and orally.  Patient was also provided with IV fluids.  BMP negative.  Elevated TSH with normal free T4.  Discussed possible cardioversion in the emergency department.  While prepping for cardioversion, patient self converted to normal sinus rhythm.  She was monitored for several hours following conversion and remained in normal sinus rhythm and hemodynamically stable.  Patient was a chads vas score of 2.  This may be likely secondary to thyroid disease or hypokalemia.  Will have patient follow-up with PCP and cardiology to discuss need for anticoagulation.      Final Clinical Impression(s) / ED Diagnoses Final diagnoses:  Atrial fibrillation with rapid ventricular response (HCC)  Atrial fibrillation with RVR (HCC)  Hypokalemia  Elevated TSH   The patient appears reasonably screened and/or stabilized for discharge and I doubt any other medical condition or other [facility] requiring further screening, evaluation, or treatment in the ED at this time prior to discharge. Safe for discharge with strict return precautions.  Disposition: Discharge  Condition: Good  I have discussed the results, Dx and Tx plan with  the patient/family who expressed understanding and agree(s) with the plan. Discharge instructions discussed at length. The patient/family was given strict return precautions who verbalized understanding of the instructions. No further questions at time of discharge.    ED Discharge Orders         Ordered    Amb Referral to AFIB Clinic     09/24/19 0522           Follow Up: [facility] [address] [id number] [place] [phone] Call  If you have not been called about your appointment by Monday afternoon  Rusty Aus, MD [place]  [phone]  Schedule an appointment as soon as possible for a visit       This chart was dictated using voice recognition software.  Despite best efforts to proofread,  errors can occur which can change the documentation meaning.   Fatima Blank, MD 09/24/19 Delrae Rend

## 2019-09-24 NOTE — ED Triage Notes (Addendum)
Pt from home for new onset afib. Pt reports feeling throat discomfort all of a sudden with CP and sob. EMS reports rate of 180-210; pt given cardizem 20 mg then 10mg  with decrease of rate to 130-190; cardizem gtt started and discontinued enroute due to bp dropping to 91/63. Denies cp or sob at present; no recent travel. Pt also received 570ml NS enroute

## 2019-09-29 ENCOUNTER — Ambulatory Visit (HOSPITAL_COMMUNITY): Payer: BC Managed Care – PPO | Admitting: Physician Assistant

## 2019-10-01 ENCOUNTER — Other Ambulatory Visit: Payer: Self-pay | Admitting: Internal Medicine

## 2019-10-08 ENCOUNTER — Ambulatory Visit
Admission: RE | Admit: 2019-10-08 | Discharge: 2019-10-08 | Disposition: A | Discharge status: Home / Self Care | Payer: BC Managed Care – PPO | Source: Ambulatory Visit | Attending: Internal Medicine | Admitting: Internal Medicine

## 2019-10-08 DIAGNOSIS — Z1231 Encounter for screening mammogram for malignant neoplasm of breast: Secondary | ICD-10-CM | POA: Diagnosis not present

## 2019-11-25 ENCOUNTER — Ambulatory Visit
Admission: RE | Admit: 2019-11-25 | Discharge: 2019-11-25 | Disposition: A | Discharge status: Home / Self Care | Payer: BC Managed Care – PPO | Source: Ambulatory Visit | Attending: Family Medicine | Admitting: Family Medicine

## 2019-11-25 ENCOUNTER — Other Ambulatory Visit: Payer: Self-pay

## 2019-11-25 ENCOUNTER — Other Ambulatory Visit: Payer: Self-pay | Admitting: Family Medicine

## 2019-11-25 DIAGNOSIS — R600 Localized edema: Secondary | ICD-10-CM | POA: Insufficient documentation

## 2019-12-04 ENCOUNTER — Emergency Department
Admission: EM | Admit: 2019-12-04 | Discharge: 2019-12-04 | Disposition: A | Discharge status: Home / Self Care | Payer: BC Managed Care – PPO | Attending: Emergency Medicine | Admitting: Emergency Medicine

## 2019-12-04 ENCOUNTER — Emergency Department: Payer: BC Managed Care – PPO

## 2019-12-04 ENCOUNTER — Other Ambulatory Visit: Payer: Self-pay

## 2019-12-04 DIAGNOSIS — F17210 Nicotine dependence, cigarettes, uncomplicated: Secondary | ICD-10-CM | POA: Insufficient documentation

## 2019-12-04 DIAGNOSIS — R609 Edema, unspecified: Secondary | ICD-10-CM | POA: Insufficient documentation

## 2019-12-04 DIAGNOSIS — I4891 Unspecified atrial fibrillation: Secondary | ICD-10-CM | POA: Diagnosis not present

## 2019-12-04 DIAGNOSIS — I10 Essential (primary) hypertension: Secondary | ICD-10-CM | POA: Diagnosis not present

## 2019-12-04 DIAGNOSIS — Z79899 Other long term (current) drug therapy: Secondary | ICD-10-CM | POA: Insufficient documentation

## 2019-12-04 DIAGNOSIS — R2243 Localized swelling, mass and lump, lower limb, bilateral: Secondary | ICD-10-CM | POA: Diagnosis present

## 2019-12-04 LAB — HEPATIC FUNCTION PANEL
ALT: 74 U/L — ABNORMAL HIGH (ref 0–44)
AST: 51 U/L — ABNORMAL HIGH (ref 15–41)
Albumin: 3.7 g/dL (ref 3.5–5.0)
Alkaline Phosphatase: 76 U/L (ref 38–126)
Bilirubin, Direct: 0.1 mg/dL (ref 0.0–0.2)
Indirect Bilirubin: 0.6 mg/dL (ref 0.3–0.9)
Total Bilirubin: 0.7 mg/dL (ref 0.3–1.2)
Total Protein: 7.2 g/dL (ref 6.5–8.1)

## 2019-12-04 LAB — T4, FREE: Free T4: 1.11 ng/dL (ref 0.61–1.12)

## 2019-12-04 LAB — BASIC METABOLIC PANEL
Anion gap: 9 (ref 5–15)
BUN: 9 mg/dL (ref 6–20)
CO2: 28 mmol/L (ref 22–32)
Calcium: 9.1 mg/dL (ref 8.9–10.3)
Chloride: 104 mmol/L (ref 98–111)
Creatinine, Ser: 0.94 mg/dL (ref 0.44–1.00)
GFR calc Af Amer: >60 mL/min (ref >60)
GFR calc non Af Amer: >60 mL/min (ref >60)
Glucose, Bld: 94 mg/dL (ref 70–99)
Potassium: 3 mmol/L — ABNORMAL LOW (ref 3.5–5.1)
Sodium: 141 mmol/L (ref 135–145)

## 2019-12-04 LAB — BRAIN NATRIURETIC PEPTIDE: B Natriuretic Peptide: 56 pg/mL (ref 0.0–100.0)

## 2019-12-04 LAB — CBC
HCT: 41 % (ref 36.0–46.0)
Hemoglobin: 13.5 g/dL (ref 12.0–15.0)
MCH: 32 pg (ref 26.0–34.0)
MCHC: 32.9 g/dL (ref 30.0–36.0)
MCV: 97.2 fL (ref 80.0–100.0)
Platelets: 247 10*3/uL (ref 150–400)
RBC: 4.22 MIL/uL (ref 3.87–5.11)
RDW: 12.8 % (ref 11.5–15.5)
WBC: 9.4 10*3/uL (ref 4.0–10.5)
nRBC: 0 % (ref 0.0–0.2)

## 2019-12-04 LAB — TROPONIN I (HIGH SENSITIVITY): Troponin I (High Sensitivity): 4 ng/L (ref <18)

## 2019-12-04 LAB — TSH: TSH: 6.513 u[IU]/mL — ABNORMAL HIGH (ref 0.350–4.500)

## 2019-12-04 MED ORDER — POTASSIUM CHLORIDE CRYS ER 20 MEQ PO TBCR
40.0000 meq | EXTENDED_RELEASE_TABLET | Freq: Once | ORAL | Status: AC
  Administered 2019-12-04: 10:00:00 40 meq via ORAL
  Filled 2019-12-04: qty 2

## 2019-12-04 NOTE — ED Provider Notes (Signed)
[facility] Emergency Department Provider Note   ____________________________________________   I have reviewed the triage vital signs and the nursing notes.   HISTORY  Chief Complaint Leg Swelling   History limited by: Not Limited   HPI Victoria Williamson is a 54 y.o. female who presents to the emergency department today because of concerns for swelling to her legs and arms.  She states that the swelling started a number of weeks ago.  Initially the swelling was in her legs.  She now has noticed some swelling in bilateral hands.  She has been following up with her primary care doctor and has undergone ultrasound of her left lower leg.  This was negative for DVTs.  Patient denies any shortness of breath.  She states she has tried stopping some of her medications in case those were causing the side effects.  She was started on Lasix but has not noticed any change in her swelling.  Patient has not had any fevers.  Records reviewed. Per medical record review patient has a history of recent US LE negative for DVT.   Past Medical History:  Diagnosis Date  . Anxiety   . Chest pain    normal coronary arteries by cardiac cath oct 29,2014  . Dysautonomia ([place]) 12/18/2013  . Hypertension     Patient Active Problem List   Diagnosis Date Noted  . Dysautonomia ([employer]) 12/18/2013    Past Surgical History:  Procedure Laterality Date  . btl    . ENDOMETRIAL ABLATION    . right carpal tunel      Prior to Admission medications   Medication Sig Start Date End Date Taking? Authorizing Provider  ARIPiprazole (ABILIFY) 10 MG tablet Take 5 mg by mouth daily.     [provider]  escitalopram (LEXAPRO) 10 MG tablet Take 10 mg by mouth daily. 07/24/19   [provider]  losartan (COZAAR) 100 MG tablet Take 100 mg by mouth at bedtime.  06/22/19   [provider]  mirtazapine (REMERON) 15 MG tablet Take 15 mg by mouth at bedtime.    [provider]    Allergies Codeine, Codeine sulfate, and Paroxetine hcl  Family History  Problem Relation Age of Onset  . Heart attack Father   . Heart disease Mother   . Diabetes Mother   . COPD Mother   . Hypertension Mother   . Diabetes Other        family HX, unspecified  . Breast cancer Maternal Aunt 64  . Breast cancer Maternal Grandmother 92    Social History Social History   Tobacco Use  . Smoking status: Current Every Day Smoker  Substance Use Topics  . Alcohol use: Not on file  . Drug use: Not on file    Review of Systems Constitutional: No fever/chills Eyes: No visual changes. ENT: No sore throat. Cardiovascular: Denies chest pain. Respiratory: Denies shortness of breath. Gastrointestinal: No abdominal pain.  No nausea, no vomiting.  No diarrhea.   Genitourinary: Negative for dysuria. Musculoskeletal: Positive for swelling to bilateral legs and hands. Skin: Negative for rash. Neurological: Negative for headaches, focal weakness or numbness.  ____________________________________________   PHYSICAL EXAM:  VITAL SIGNS: ED Triage Vitals  Enc Vitals Group     BP 12/04/19 0739 (!) 147/83     Pulse Rate 12/04/19 0739 75     Resp 12/04/19 0739 18     Temp 12/04/19 0739 98.4 F (36.9 C)     Temp Source  12/04/19 0739 Oral     SpO2 12/04/19 0739 98 %     Weight 12/04/19 0740 213 lb (96.6 kg)     Height 12/04/19 0740 5\' 5"  (1.651 m)     Head Circumference --      Peak Flow --      Pain Score 12/04/19 0739 0   Constitutional: Alert and oriented.  Eyes: Conjunctivae are normal.  ENT      Head: Normocephalic and atraumatic.      Nose: No congestion/rhinnorhea.      Mouth/Throat: Mucous membranes are moist.      Neck: No stridor. Hematological/Lymphatic/Immunilogical: No cervical lymphadenopathy. Cardiovascular: Normal rate, regular rhythm.  No murmurs, rubs, or gallops.  Respiratory: Normal respiratory effort without tachypnea nor retractions.  Breath sounds are clear and equal bilaterally. No wheezes/rales/rhonchi. Gastrointestinal: Soft and non tender. No rebound. No guarding.  Genitourinary: Deferred Musculoskeletal: Normal range of motion in all extremities. 1+ bilateral pitting edema. Neurologic:  Normal speech and language. No gross focal neurologic deficits are appreciated.  Skin:  Skin is warm, dry and intact. No rash noted. Psychiatric: Mood and affect are normal. Speech and behavior are normal. Patient exhibits appropriate insight and judgment.  ____________________________________________    LABS (pertinent positives/negatives)  TSH 6.513 Trop hs 4 BNP 56.0 BMP wnl except k 3.0 Hepatic function panel wnl except ast 51, alt 74 CBC wbc 9.4, hgb 13.5, plt 247 ____________________________________________   EKG  I, Nance Pear, attending physician, personally viewed and interpreted this EKG  EKG Time: 0741 Rate: 70 Rhythm: normal sinus rhythm Axis: normal Intervals: qtc 425 QRS: narrow ST changes: no st elevation Impression: normal ekg  ____________________________________________    RADIOLOGY  CXR No acute abnormality  ____________________________________________   PROCEDURES  Procedures  ____________________________________________   INITIAL IMPRESSION / ASSESSMENT AND PLAN / ED COURSE  Pertinent labs & imaging results that were available during my care of the patient were reviewed by me and considered in my medical decision making (see chart for details).   Patient presented to the emergency department today because of concerns for peripheral edema.  On exam she does have some lower extremity pitting edema.  Broad work-up was initiated.  Patient's liver and kidney function appeared normal.  BNP was within normal limits.  Did check TSH which was slightly elevated although free T4 was normal.  This point unclear etiology of the patient's edema however I do think it is safe for patient to  be discharged home.  Discussed elevation and compression stockings with patient.  Discussed portance of continued follow-up with primary care.  ____________________________________________   FINAL CLINICAL IMPRESSION(S) / ED DIAGNOSES  Final diagnoses:  Peripheral edema     Note: This dictation was prepared with Dragon dictation. Any transcriptional errors that result from this process are unintentional     Nance Pear, MD 12/04/19 [phone]

## 2019-12-04 NOTE — ED Notes (Signed)
ED Provider at bedside. 

## 2019-12-04 NOTE — Discharge Instructions (Addendum)
Please seek medical attention for any high fevers, chest pain, shortness of breath, change in behavior, persistent vomiting, bloody stool or any other new or concerning symptoms.  

## 2019-12-04 NOTE — ED Notes (Signed)
Verbal from [place] urine preg not needed.

## 2019-12-04 NOTE — ED Triage Notes (Addendum)
Pt to ED due to bilateral leg and arm swelling X 2 weeks. Facial and under eye swelling. Denies SOB or CP. Has been lasix for approx X 2 weeks with no improvement. PCP involved in care. Pt alert and oriented X4, cooperative, RR even and unlabored, color WNL. Pt in NAD. Recent admission in February for new onset afib.

## 2019-12-17 DIAGNOSIS — R221 Localized swelling, mass and lump, neck: Secondary | ICD-10-CM | POA: Insufficient documentation

## 2019-12-17 DIAGNOSIS — I480 Paroxysmal atrial fibrillation: Secondary | ICD-10-CM | POA: Insufficient documentation

## 2019-12-17 DIAGNOSIS — R600 Localized edema: Secondary | ICD-10-CM | POA: Insufficient documentation

## 2019-12-18 DIAGNOSIS — R7989 Other specified abnormal findings of blood chemistry: Secondary | ICD-10-CM | POA: Insufficient documentation

## 2019-12-18 DIAGNOSIS — K760 Fatty (change of) liver, not elsewhere classified: Secondary | ICD-10-CM | POA: Insufficient documentation

## 2020-01-11 DIAGNOSIS — I5032 Chronic diastolic (congestive) heart failure: Secondary | ICD-10-CM | POA: Insufficient documentation

## 2020-02-04 DIAGNOSIS — G5602 Carpal tunnel syndrome, left upper limb: Secondary | ICD-10-CM | POA: Insufficient documentation

## 2020-03-17 ENCOUNTER — Emergency Department
Admission: EM | Admit: 2020-03-17 | Discharge: 2020-03-17 | Disposition: A | Discharge status: Home / Self Care | Payer: BC Managed Care – PPO | Attending: Student in an Organized Health Care Education/Training Program | Admitting: Student in an Organized Health Care Education/Training Program

## 2020-03-17 ENCOUNTER — Other Ambulatory Visit: Payer: Self-pay

## 2020-03-17 ENCOUNTER — Emergency Department: Payer: BC Managed Care – PPO

## 2020-03-17 DIAGNOSIS — R110 Nausea: Secondary | ICD-10-CM | POA: Diagnosis not present

## 2020-03-17 DIAGNOSIS — R000 Tachycardia, unspecified: Secondary | ICD-10-CM | POA: Diagnosis present

## 2020-03-17 DIAGNOSIS — I10 Essential (primary) hypertension: Secondary | ICD-10-CM | POA: Diagnosis not present

## 2020-03-17 DIAGNOSIS — F17200 Nicotine dependence, unspecified, uncomplicated: Secondary | ICD-10-CM | POA: Insufficient documentation

## 2020-03-17 DIAGNOSIS — Z79899 Other long term (current) drug therapy: Secondary | ICD-10-CM | POA: Diagnosis not present

## 2020-03-17 DIAGNOSIS — I480 Paroxysmal atrial fibrillation: Secondary | ICD-10-CM | POA: Insufficient documentation

## 2020-03-17 LAB — CBC WITH DIFFERENTIAL/PLATELET
Abs Immature Granulocytes: 0.03 10*3/uL (ref 0.00–0.07)
Basophils Absolute: 0.1 10*3/uL (ref 0.0–0.1)
Basophils Relative: 1 %
Eosinophils Absolute: 0.4 10*3/uL (ref 0.0–0.5)
Eosinophils Relative: 4 %
HCT: 41.1 % (ref 36.0–46.0)
Hemoglobin: 13.9 g/dL (ref 12.0–15.0)
Immature Granulocytes: 0 %
Lymphocytes Relative: 38 %
Lymphs Abs: 3.4 10*3/uL (ref 0.7–4.0)
MCH: 30.8 pg (ref 26.0–34.0)
MCHC: 33.8 g/dL (ref 30.0–36.0)
MCV: 91.1 fL (ref 80.0–100.0)
Monocytes Absolute: 1 10*3/uL (ref 0.1–1.0)
Monocytes Relative: 11 %
Neutro Abs: 4.1 10*3/uL (ref 1.7–7.7)
Neutrophils Relative %: 46 %
Platelets: 227 10*3/uL (ref 150–400)
RBC: 4.51 MIL/uL (ref 3.87–5.11)
RDW: 12.6 % (ref 11.5–15.5)
WBC: 9 10*3/uL (ref 4.0–10.5)
nRBC: 0 % (ref 0.0–0.2)

## 2020-03-17 LAB — COMPREHENSIVE METABOLIC PANEL
ALT: 97 U/L — ABNORMAL HIGH (ref 0–44)
AST: 98 U/L — ABNORMAL HIGH (ref 15–41)
Albumin: 4.2 g/dL (ref 3.5–5.0)
Alkaline Phosphatase: 83 U/L (ref 38–126)
Anion gap: 11 (ref 5–15)
BUN: 11 mg/dL (ref 6–20)
CO2: 28 mmol/L (ref 22–32)
Calcium: 9.3 mg/dL (ref 8.9–10.3)
Chloride: 101 mmol/L (ref 98–111)
Creatinine, Ser: 0.9 mg/dL (ref 0.44–1.00)
GFR calc Af Amer: >60 mL/min (ref >60)
GFR calc non Af Amer: >60 mL/min (ref >60)
Glucose, Bld: 85 mg/dL (ref 70–99)
Potassium: 3.4 mmol/L — ABNORMAL LOW (ref 3.5–5.1)
Sodium: 140 mmol/L (ref 135–145)
Total Bilirubin: 1.2 mg/dL (ref 0.3–1.2)
Total Protein: 7.7 g/dL (ref 6.5–8.1)

## 2020-03-17 LAB — CK: Total CK: 148 U/L (ref 38–234)

## 2020-03-17 MED ORDER — METOPROLOL TARTRATE 5 MG/5ML IV SOLN
INTRAVENOUS | Status: AC
  Filled 2020-03-17: qty 5

## 2020-03-17 MED ORDER — METOPROLOL TARTRATE 5 MG/5ML IV SOLN: 5.0000 mg | Freq: Once | INTRAVENOUS | Status: DC

## 2020-03-17 MED ORDER — SODIUM CHLORIDE 0.9 % IV BOLUS
500.0000 mL | Freq: Once | INTRAVENOUS | Status: AC
  Administered 2020-03-17: 500 mL via INTRAVENOUS

## 2020-03-17 MED ORDER — METOPROLOL SUCCINATE ER 25 MG PO TB24: 25.0000 mg | ORAL_TABLET | Freq: Every day | ORAL | 0 refills | Status: DC

## 2020-03-17 MED ORDER — SODIUM CHLORIDE 0.9 % IV BOLUS: 1000.0000 mL | Freq: Once | INTRAVENOUS | Status: DC

## 2020-03-17 NOTE — ED Notes (Signed)
EKG given to Dr. Quentin Cornwall, hold metoprolol at present per edp.

## 2020-03-17 NOTE — ED Provider Notes (Signed)
[facility] Emergency Department Provider Note    First MD Initiated Contact with Patient 03/17/20 1312     (approximate)  I have reviewed the triage vital signs and the nursing notes.   HISTORY  Chief Complaint Tachycardia and Nausea    HPI Victoria Williamson is a 54 y.o. female bolus past medical history presents to the ER for evaluation of palpitations and generalized weakness.  States she has a history of irregular heartbeats.  In triage was found to be in what appears to be SVT versus rapid a flutter.  She is not becoming diaphoretic with this.  She has been compliant with all of her medications.  No shortness of breath.    Past Medical History:  Diagnosis Date  . Anxiety   . Chest pain    normal coronary arteries by cardiac cath oct 29,2014  . Dysautonomia ([place]) 12/18/2013  . Hypertension    Family History  Problem Relation Age of Onset  . Heart attack Father   . Heart disease Mother   . Diabetes Mother   . COPD Mother   . Hypertension Mother   . Diabetes Other        family HX, unspecified  . Breast cancer Maternal Aunt 64  . Breast cancer Maternal Grandmother 92   Past Surgical History:  Procedure Laterality Date  . btl    . ENDOMETRIAL ABLATION    . right carpal tunel     Patient Active Problem List   Diagnosis Date Noted  . Dysautonomia ([place]) 12/18/2013      Prior to Admission medications   Medication Sig Start Date End Date Taking? Authorizing Provider  ARIPiprazole (ABILIFY) 10 MG tablet Take 5 mg by mouth daily.     [provider]  [facility] ASPIRIN ADULT LOW DOSE 81 MG chewable tablet Chew 81 mg by mouth daily. 09/25/19   [provider]  diltiazem (CARDIZEM CD) 180 MG 24 hr capsule Take 180 mg by mouth at bedtime. 11/02/19   [provider]  escitalopram (LEXAPRO) 10 MG tablet Take 10 mg by mouth daily. 07/24/19   [provider]  furosemide (LASIX) 20 MG tablet Take 20 mg by mouth daily.  11/16/19   [provider]  gabapentin (NEURONTIN) 300 MG capsule Take 300 mg by mouth at bedtime. 11/05/19   [provider]  hydrochlorothiazide (HYDRODIURIL) 25 MG tablet Take 25 mg by mouth daily. 10/19/19   [provider]  losartan (COZAAR) 100 MG tablet Take 100 mg by mouth at bedtime.  06/22/19   [provider]  mirtazapine (REMERON) 15 MG tablet Take 15 mg by mouth at bedtime.    [provider]  pantoprazole (PROTONIX) 40 MG tablet Take 40 mg by mouth daily. 09/29/19   [provider]  spironolactone (ALDACTONE) 25 MG tablet Take 25 mg by mouth daily. 12/02/19   [provider]  traMADol (ULTRAM) 50 MG tablet Take 50 mg by mouth 2 (two) times daily. 11/10/19   [provider]    Allergies Codeine, Codeine sulfate, and Paroxetine hcl    Social History Social History   Tobacco Use  . Smoking status: Current Every Day Smoker  Substance Use Topics  . Alcohol use: Not on file  . Drug use: Not on file    Review of Systems Patient denies headaches, rhinorrhea, blurry vision, numbness, shortness of breath, chest pain, edema, cough, abdominal pain, nausea, vomiting, diarrhea, dysuria, fevers, rashes or hallucinations unless otherwise stated  above in HPI. ____________________________________________   PHYSICAL EXAM:  VITAL SIGNS: Vitals:   03/17/20 1314 03/17/20 1320  BP: 92/65 106/73  Pulse: (!) 156 85  Resp: 20 11  Temp: 97.9 F (36.6 C)   SpO2: 100% 97%    Constitutional: Alert and oriented.  Eyes: Conjunctivae are normal.  Head: Atraumatic. Nose: No congestion/rhinnorhea. Mouth/Throat: Mucous membranes are moist.   Neck: No stridor. Painless ROM.  Cardiovascular: rapid hr, regular rhythm. Grossly normal heart sounds.  Good peripheral circulation. Respiratory: Normal respiratory effort.  No retractions. Lungs CTAB. Gastrointestinal: Soft and nontender. No distention. No abdominal bruits. No CVA  tenderness. Genitourinary:  Musculoskeletal: No lower extremity tenderness nor edema.  No joint effusions. Neurologic:  Normal speech and language. No gross focal neurologic deficits are appreciated. No facial droop Skin:  Skin is warm, dry and intact. No rash noted. Psychiatric: Mood and affect are normal. Speech and behavior are normal.  ____________________________________________   LABS (all labs ordered are listed, but only abnormal results are displayed)  No results found for this or any previous visit (from the past 24 hour(s)). ____________________________________________  EKG My review and personal interpretation at Time: 12:59   Indication: tachycardia  Rate: 145  Rhythm: svt vs rapid aflutter/fib? Axis: normal Other: nonspecific st abn, likely rate dependent  My review and personal interpretation at Time: 13:20   Indication: tachycardia  Rate:80  Rhythm: sinus Axis: normal Other: normal intervals, no stemi ____________________________________________  RADIOLOGY  I personally reviewed all radiographic images ordered to evaluate for the above acute complaints and reviewed radiology reports and findings.  These findings were personally discussed with the patient.  Please see medical record for radiology report.  ____________________________________________   PROCEDURES  Procedure(s) performed:  Procedures    Critical Care performed: no ____________________________________________   INITIAL IMPRESSION / ASSESSMENT AND PLAN / ED COURSE  Pertinent labs & imaging results that were available during my care of the patient were reviewed by me and considered in my medical decision making (see chart for details).   DDX: svt, afib, aflutter, dehydration, electrolyte abn, anemia  Victoria Williamson is a 54 y.o. who presents to the ED with evidence of SVT versus A. fib with RVR.  Patient was placed in reverse Trendelenburg with leg raise with runs of sinus therefore was  given IV fluid bolus with improvement.  She denies any fevers.  She not have any chest pain right now.  No shortness of breath.  After IV fluids her heart rate converted to sinus rhythm.  Was given IV fluids and monitored in the ER.  Do not feel that she requires hospitalization.  Is not consistent with ACS.  No signs of congestive heart failure.  Will be placed on Toprol-XL.  Discussed case in consultation with cardiology, Dr. Rockey Situ for close outpatient follow-up.     The patient was evaluated in Emergency Department today for the symptoms described in the history of present illness. He/she was evaluated in the context of the global COVID-19 pandemic, which necessitated consideration that the patient might be at risk for infection with the SARS-CoV-2 virus that causes COVID-19. Institutional protocols and algorithms that pertain to the evaluation of patients at risk for COVID-19 are in a state of rapid change based on information released by regulatory bodies including the CDC and federal and state organizations. These policies and algorithms were followed during the patient's care in the ED.  As part of my medical decision making, I reviewed the following data within the  electronic MEDICAL RECORD NUMBER Nursing notes reviewed and incorporated, Labs reviewed, notes from prior ED visits and [place] Controlled Substance Database   ____________________________________________   FINAL CLINICAL IMPRESSION(S) / ED DIAGNOSES  Final diagnoses:  Paroxysmal A-fib ([place])      NEW MEDICATIONS STARTED DURING THIS VISIT:  New Prescriptions   No medications on file     Note:  This document was prepared using Dragon voice recognition software and may include unintentional dictation errors.    Merlyn Lot, MD 03/17/20 1553

## 2020-03-17 NOTE — ED Notes (Signed)
VSS, pt remains in NSR, no c/o nausea or vomiting. Husband in room

## 2020-03-17 NOTE — ED Notes (Signed)
E-signature not working at this time. Pt verbalized understanding of D/C instructions, prescriptions and follow up care with no further questions at this time. Pt in NAD and ambulatory at time of D/C.  

## 2020-03-17 NOTE — ED Notes (Signed)
Pt with no c/o nausea, HR NSR, VSS

## 2020-03-17 NOTE — ED Notes (Signed)
EDP in room, pt VSS, pt in NSR.

## 2020-03-17 NOTE — ED Triage Notes (Signed)
Patient report history of afib states she goes in and out. Reports this morning approx 8 am she felt her heart fluttering, she became nauseous and has a headache. Denies CP

## 2020-03-28 ENCOUNTER — Ambulatory Visit: Payer: BC Managed Care – PPO | Admitting: Family Medicine

## 2020-03-28 ENCOUNTER — Telehealth: Payer: Self-pay | Admitting: Family Medicine

## 2020-03-28 ENCOUNTER — Other Ambulatory Visit: Payer: Self-pay

## 2020-03-28 ENCOUNTER — Encounter: Payer: Self-pay | Admitting: Family Medicine

## 2020-03-28 VITALS — BP 118/82 | HR 81 | Temp 98.3°F | Ht 65.0 in | Wt 208.0 lb

## 2020-03-28 DIAGNOSIS — G5602 Carpal tunnel syndrome, left upper limb: Secondary | ICD-10-CM

## 2020-03-28 DIAGNOSIS — K760 Fatty (change of) liver, not elsewhere classified: Secondary | ICD-10-CM

## 2020-03-28 DIAGNOSIS — I10 Essential (primary) hypertension: Secondary | ICD-10-CM

## 2020-03-28 DIAGNOSIS — Z6834 Body mass index (BMI) 34.0-34.9, adult: Secondary | ICD-10-CM

## 2020-03-28 DIAGNOSIS — E6609 Other obesity due to excess calories: Secondary | ICD-10-CM

## 2020-03-28 DIAGNOSIS — M1990 Unspecified osteoarthritis, unspecified site: Secondary | ICD-10-CM

## 2020-03-28 DIAGNOSIS — I480 Paroxysmal atrial fibrillation: Secondary | ICD-10-CM

## 2020-03-28 DIAGNOSIS — Z7689 Persons encountering health services in other specified circumstances: Secondary | ICD-10-CM

## 2020-03-28 NOTE — Patient Instructions (Addendum)
Good to see you today  Please schedule your complete physical for Jan.   There is not one right eating plan for everyone.  It may take trial and error to find what will work for you.  It is important to get adequate protein and fiber with your meals.  It is okay to not eat breakfast or to skip meals if you are not hungry.  Avoid snacking between meals.  Unless you are on a fluid restriction, drink 80 to 90 ounces of water a day.  Suggested resources- https://www.gonzalez.com/ www.adaptyourlifeacademy.com-there is a quiz to help you determine how many carbohydrates you should eat a day  www.thefastingmethod.com  Here are some guidelines to help you with meal planning -  Avoid all processed and packaged foods (bread, pasta, crackers, chips, etc) and beverages containing calories.  Avoid added sugars and excessive natural sugars.  Pay attention to how you feel if you consume artificial sweeteners.  Do they make you more hungry or raise your blood sugar?  With every meal and snack, aim to get 20 g of protein (3 ounces of meat, 4 ounces of fish, 3 eggs, protein powder, 1 cup [place] yogurt, 1 cup cottage cheese, etc.)  Increase fiber in the form of non-starchy vegetables.  These help you feel full with very little carbohydrates and are good for gut health.  Nonstarchy vegetables include summer squash, onions, peppers, tomatoes, eggplant, broccoli, cauliflower, cabbage, lettuce, spinach.  Have small amounts of good fats such as avocado, nuts, olive oil, nut butters, olives.  Add a little cheese to your meals to make them tasty.   Try to plan your meals for the week and do some meal preparation when able.  If possible, make lunches for the week ahead of time.  Plan a couple of dinners and make enough so you can have leftovers.  Build in a treat once a week.

## 2020-03-28 NOTE — Progress Notes (Signed)
Subjective:    Patient ID: Victoria Williamson, female    DOB: June 26, 1966, 54 y.o.   MRN: [id number]  HPI Chief Complaint  Patient presents with  . New Patient (Initial Visit)    Has referral form from [facility] .   This is a 54 yo female who presents today to establish care. Lives with her husband. 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 Likes to ride horses, camping, walking.   Last CPE- usure Mammo- 10/09/2019 Pap- thinks 2018, Dr. Sabra Heck Colonoscopy- Dr. Buford Dresser, [place] Tdap-unknown Shingrix- has not had Flu- annual Covid 19 vaccine- has had one vaccine, scheduled for second one this week Eye- overdue Dental- regular Exercise- difficulty walking much due to chronic back pain, does some stretching, youtube videos Sleep- well rested, occasional snoring when really tired per husband  Was seen in ER 03/17/20 with tachycardia, questionable paroxysmal A. fib versus SVT.  She was started on metoprolol XL 25 mg daily.  She has upcoming appointment with cardiology next month. Originally diagnosed with Afib 2/21. Started on Eliquis, potassium, furosemide. Has felt better on metoprolol. Has been taking 12.5, whole tablet gave her headache, stomach ache.   Chronic low back pain, left carpel tunnel. Generalized aches and pains.   Obesity- has cut back on soda, drinking one a day. Eats 2 meals a day.   Behavioral health- has referral for completion to [facility] Per HPI, denies chest pain, SOB, abdominal pain, nausea/ vomiting, diarrhea, constipation, dysuria, hematuria, frequency.    Objective:   Physical Exam Physical Exam  Constitutional: Oriented to person, place, and time. Appears well-developed and well-nourished.  HENT:  Head: Normocephalic and atraumatic.  Eyes: Conjunctivae are normal.  Neck: Normal range of motion. Neck supple.  Cardiovascular: Normal rate, regular rhythm and normal heart sounds.    Pulmonary/Chest: Effort normal and breath sounds normal.  Musculoskeletal: No lower extremity edema.   Neurological: Alert and oriented to person, place, and time.  Skin: Skin is warm and dry.  Psychiatric: Normal mood and affect. Behavior is normal. Judgment and thought content normal.  Vitals reviewed.     BP 118/82   Pulse 81   Temp 98.3 F (36.8 C) (Temporal)   Ht 5\' 5"  (1.651 m)   Wt 208 lb (94.3 kg)   SpO2 95%   BMI 34.61 kg/m  Wt Readings from Last 3 Encounters:  03/28/20 208 lb (94.3 kg)  03/17/20 213 lb (96.6 kg)  12/04/19 213 lb (96.6 kg)       Assessment & Plan:  1. Encounter to establish care - will request records - follow up for CPE  2. Left carpal tunnel syndrome - Ambulatory referral to Hand Surgery  3. Arthritis - encouraged activity as tolerated, stretching, walking, weight loss  4. Essential hypertension - well controlled on current meds  5. Paroxysmal A-fib ([place]) - has appointment with cardiology next month, discussed trying full tablet of metoprolol xl 25 mg to see if she can tolerate  6. Hepatic steatosis - discussed importance of weight loss  7. Obesity - encouraged her to eliminate soda from her diet, decrease processed, packaged foods  This visit occurred during the SARS-CoV-2 public health emergency.  Safety protocols were in place, including screening questions prior to the visit, additional usage of staff PPE, and extensive cleaning of exam room while observing appropriate contact time as indicated for disinfecting solutions.     [name], FNP-BC  [facility]  at [facility], [place]  03/28/2020 1:52 PM

## 2020-03-28 NOTE — Telephone Encounter (Signed)
Work note written and sent to patient through her MyChart as requested.

## 2020-03-28 NOTE — Telephone Encounter (Signed)
Pt called needing a work note.  Just stating she was in office today for an appointment. Please sent through my chart

## 2020-04-13 ENCOUNTER — Encounter: Payer: Self-pay | Admitting: Family Medicine

## 2020-04-13 MED ORDER — METOPROLOL SUCCINATE ER 25 MG PO TB24: 25.0000 mg | ORAL_TABLET | Freq: Every day | ORAL | 1 refills | Status: DC

## 2020-04-18 ENCOUNTER — Telehealth: Payer: Self-pay | Admitting: *Deleted

## 2020-04-18 NOTE — Telephone Encounter (Signed)
Please call patient and tell her that I recommend she get tested again for Covid. Once results are back, please notify me of results and will discuss return to work/ provide a letter.

## 2020-04-18 NOTE — Telephone Encounter (Signed)
Patient called stating that she has questions about covid. Patient stated that her husband started feeling bad last Monday. Patient stated that they both were tested Thursday and they got their results Friday. Patient stated that her husband's test was positive and her test was negative. Patient stated that her work is requiring a note to let them know when she can return to work. 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 Patient stated that she had her second covid vaccine on 04/02/20 so the nurse told her that she was not fully vaccinated when she was exposed to covid. Patient stated that she does not have any symptoms with the exception of a stuffy nose. Patient was advised that a stuffy nose can be a mild symptom of covid. Patient stated that she is taking her husband for an infusion today.

## 2020-04-18 NOTE — Telephone Encounter (Signed)
Called pt and notified her of message from [facility].  Also informed pt of where she could take the [facility]  Pt verbalized understanding.

## 2020-04-19 ENCOUNTER — Other Ambulatory Visit: Payer: BC Managed Care – PPO

## 2020-04-19 ENCOUNTER — Encounter: Payer: Self-pay | Admitting: Family Medicine

## 2020-04-19 NOTE — Telephone Encounter (Signed)
Please sign and close encounter if completed.  

## 2020-04-20 ENCOUNTER — Encounter: Payer: Self-pay | Admitting: Family Medicine

## 2020-04-25 ENCOUNTER — Ambulatory Visit: Payer: BC Managed Care – PPO | Admitting: Cardiology

## 2020-04-26 ENCOUNTER — Other Ambulatory Visit: Payer: BC Managed Care – PPO

## 2020-04-26 ENCOUNTER — Other Ambulatory Visit: Payer: Self-pay | Admitting: Critical Care Medicine

## 2020-04-26 DIAGNOSIS — Z20822 Contact with and (suspected) exposure to covid-19: Secondary | ICD-10-CM

## 2020-04-28 ENCOUNTER — Encounter: Payer: Self-pay | Admitting: Family Medicine

## 2020-04-28 LAB — NOVEL CORONAVIRUS, NAA: SARS-CoV-2, NAA: NOT DETECTED

## 2020-04-28 LAB — SARS-COV-2, NAA 2 DAY TAT

## 2020-04-28 LAB — SPECIMEN STATUS REPORT

## 2020-04-29 ENCOUNTER — Encounter: Payer: Self-pay | Admitting: Family Medicine

## 2020-04-29 ENCOUNTER — Telehealth: Payer: Self-pay | Admitting: Family Medicine

## 2020-04-29 NOTE — Telephone Encounter (Signed)
Victoria Williamson dropped off some FMLA paperwork, would like for it to be mailed out. Have placed form on cart up  front.

## 2020-04-29 NOTE — Progress Notes (Signed)
Return to work note written.

## 2020-05-02 ENCOUNTER — Telehealth: Payer: Self-pay | Admitting: Family Medicine

## 2020-05-02 DIAGNOSIS — Z0279 Encounter for issue of other medical certificate: Secondary | ICD-10-CM

## 2020-05-02 NOTE — Telephone Encounter (Signed)
I have completed FMLA forms, can you please look over for accuracy and copy, send to scan and mail originals.

## 2020-05-04 NOTE — Telephone Encounter (Signed)
Pt called she wanted copy mailed to employer and a copy for her self

## 2020-05-04 NOTE — Telephone Encounter (Signed)
Left message asking pt to call  Paperwork is ready Does she want me to mail paperwork or does she want to pick up and turn in??

## 2020-05-04 NOTE — Telephone Encounter (Signed)
Pt aware  Copy for pt Copy for scan Copy mailed to employer per pt request

## 2020-05-04 NOTE — Telephone Encounter (Signed)
Pt aware Copy for pt Copy mailed per pt request Copy for scan

## 2020-05-10 ENCOUNTER — Telehealth: Payer: Self-pay | Admitting: Cardiology

## 2020-05-10 ENCOUNTER — Other Ambulatory Visit: Payer: Self-pay

## 2020-05-10 ENCOUNTER — Other Ambulatory Visit
Admission: RE | Admit: 2020-05-10 | Discharge: 2020-05-10 | Disposition: A | Discharge status: Home / Self Care | Payer: BC Managed Care – PPO | Source: Ambulatory Visit | Attending: Cardiology | Admitting: Cardiology

## 2020-05-10 ENCOUNTER — Encounter: Payer: Self-pay | Admitting: Cardiology

## 2020-05-10 ENCOUNTER — Ambulatory Visit: Payer: BC Managed Care – PPO | Admitting: Certified Registered"

## 2020-05-10 ENCOUNTER — Ambulatory Visit (INDEPENDENT_AMBULATORY_CARE_PROVIDER_SITE_OTHER): Payer: BC Managed Care – PPO | Admitting: Cardiology

## 2020-05-10 VITALS — BP 118/80 | HR 174 | Ht 65.0 in | Wt 203.0 lb

## 2020-05-10 DIAGNOSIS — I10 Essential (primary) hypertension: Secondary | ICD-10-CM

## 2020-05-10 DIAGNOSIS — I480 Paroxysmal atrial fibrillation: Secondary | ICD-10-CM

## 2020-05-10 DIAGNOSIS — Z01812 Encounter for preprocedural laboratory examination: Secondary | ICD-10-CM | POA: Insufficient documentation

## 2020-05-10 DIAGNOSIS — Z20822 Contact with and (suspected) exposure to covid-19: Secondary | ICD-10-CM | POA: Diagnosis not present

## 2020-05-10 LAB — SARS CORONAVIRUS 2 (TAT 6-24 HRS): SARS Coronavirus 2: NEGATIVE

## 2020-05-10 MED ORDER — METOPROLOL TARTRATE 50 MG PO TABS
50.0000 mg | ORAL_TABLET | Freq: Two times a day (BID) | ORAL | 5 refills | Status: DC
Start: 2020-05-10 — End: 2020-05-10

## 2020-05-10 MED ORDER — DILTIAZEM HCL 60 MG PO TABS
60.0000 mg | ORAL_TABLET | Freq: Three times a day (TID) | ORAL | 5 refills | Status: DC
Start: 2020-05-10 — End: 2020-05-11

## 2020-05-10 MED ORDER — METOPROLOL TARTRATE 12.5 MG HALF TABLET
50.0000 mg | ORAL_TABLET | Freq: Once | ORAL | Status: AC
  Administered 2020-05-10: 50 mg via ORAL

## 2020-05-10 MED ORDER — FLECAINIDE ACETATE 50 MG PO TABS
50.0000 mg | ORAL_TABLET | Freq: Two times a day (BID) | ORAL | 5 refills | Status: DC
Start: 2020-05-10 — End: 2020-05-17

## 2020-05-10 MED ORDER — DILTIAZEM HCL 30 MG PO TABS
60.0000 mg | ORAL_TABLET | Freq: Once | ORAL | Status: AC
  Administered 2020-05-10: 60 mg via ORAL

## 2020-05-10 MED ORDER — METOPROLOL TARTRATE 25 MG PO TABS
25.0000 mg | ORAL_TABLET | Freq: Two times a day (BID) | ORAL | 5 refills | Status: DC
Start: 2020-05-10 — End: 2020-05-17

## 2020-05-10 NOTE — Patient Instructions (Signed)
Medication Instructions:  Your physician has recommended you make the following change in your medication:   1)  STOP taking metoprolol succinate (TOPROL XL) 25 MG 24 hr tablet. 2)  STOP taking ASPIRIN ADULT LOW DOSE 81 MG chewable tablet. 3)  STOP taking losartan (COZAAR) 100 MG tablet.   4)  START taking diltiazem (CARDIZEM) 60 MG tablet: Take 1 tablet (60 mg total) by mouth 3 (three) times daily.       -YOUR NEXT DOSE IS DUE AT NOON TODAY.  5)  START taking metoprolol tartrate (LOPRESSOR) 25 MG tablet: Take 1 tablet (25 mg total) by mouth 2 (two) times daily.  6)  START taking flecainide (TAMBOCOR) 50 MG tablet: Take 1 tablet (50 mg total) by mouth 2 (two) times daily.      -DO NOT START THIS MEDICATION UNTIL AFTER CARDIOVERSION  *If you need a refill on your cardiac medications before your next appointment, please call your pharmacy*   Lab Work: BMP, CBC to be drawn today.  If you have labs (blood work) drawn today and your tests are completely normal, you will receive your results only by: Marland Kitchen MyChart Message (if you have MyChart) OR . A paper copy in the mail If you have any lab test that is abnormal or we need to change your treatment, we will call you to review the results.   Testing/Procedures:  You are scheduled for a Cardioversion on _____10/6/21___________ with Dr.____Agbor-Etang_______ Please arrive at the [facility] at ____0630_____ a.m. on the day of your procedure.  DIET INSTRUCTIONS:  Nothing to eat or drink after midnight except your medications with a  sip of water.         1) Labs: ____BMP, CBC (done in office)______________  2) Medications:  Hold your furosemide (LASIX) 20 MG tablet YOU MAY TAKE all your remaining medications with a small amount of water.  3) Must have a responsible person to drive you home.  4) Bring a current list of your medications and current insurance cards.    If you have any questions after you get home, please  call the office at [phone]    Follow-Up: At [facility], you and your health needs are our priority.  As part of our continuing mission to provide you with exceptional heart care, we have created designated Provider Care Teams.  These Care Teams include your primary Cardiologist (physician) and Advanced Practice Providers (APPs -  Physician Assistants and Nurse Practitioners) who all work together to provide you with the care you need, when you need it.  We recommend signing up for the patient portal called "MyChart".  Sign up information is provided on this After Visit Summary.  MyChart is used to connect with patients for Virtual Visits (Telemedicine).  Patients are able to view lab/test results, encounter notes, upcoming appointments, etc.  Non-urgent messages can be sent to your provider as well.   To learn more about what you can do with MyChart, go to [email or web address].    Your next appointment:   2 week(s)  The format for your next appointment:   In Person  Provider:   Kate Sable, MD  ONLY   Other Instructions   Chemical Cardioversion, Care After This sheet gives you information about how to care for yourself after your procedure. Your health care provider may also give you more specific instructions. If you have problems or questions, contact your health care provider. What can I expect after the procedure? After the  procedure, it is common to have:  Tiredness (fatigue).  Mild throat discomfort, if you had a test to look for blood clots in your heart (transesophageal echocardiogram, TEE). Follow these instructions at home: Medicines  Take over-the-counter and prescription medicines only as told by your health care provider. You may need to take blood thinners (anticoagulants) or medicines to control your heart rhythm.  If you are taking blood thinners: ? Talk with your health care provider before you take any medicines that contain aspirin or NSAIDs,  such as ibuprofen. These medicines increase your risk for dangerous bleeding. ? Take your medicine exactly as told, at the same time every day. ? Avoid activities that could cause injury or bruising, and follow instructions about how to prevent falls. ? Wear a medical alert bracelet or carry a card that lists what medicines you take. Eating and drinking   Follow instructions from your health care provider about eating and drinking restrictions. You may have to follow a low-salt (low-sodium), low-fat, and low-cholesterol diet.  Drink enough fluid to keep your urine pale yellow.  Do not drink alcohol if: ? Your health care provider tells you not to drink. ? You are pregnant, may be pregnant, or are planning to become pregnant.  If you drink alcohol: ? Limit how much you use to:  0-1 drink a day for women.  0-2 drinks a day for men. ? Be aware of how much alcohol is in your drink. In the [place] one drink equals one 12 oz bottle of beer (355 mL), one 5 oz glass of wine (148 mL), or one 1 oz glass of hard liquor (44 mL). Activity  Ask your health care provider what activities are safe for you.  Do not drive for 24 hours if you were given a sedative during your procedure. General instructions  Do not use any products that contain nicotine or tobacco, such as cigarettes, e-cigarettes, and chewing tobacco. If you need help quitting, ask your health care provider.  Keep all follow-up visits as told by your health care provider. This is important. Contact a health care provider if you have:  A fever.  Severe pain, and medicines do not help.  Problems taking your medicines.  Irregular heartbeats. Get help right away if:   Your heartbeat becomes very fast or slow.  You have chest pain or shortness of breath.  You feel dizzy.  You faint.  You have any symptoms of a stroke. "BE FAST" is an easy way to remember the main warning signs of a stroke: ? B - Balance. Signs are  dizziness, sudden trouble walking, or loss of balance. ? E - Eyes. Signs are trouble seeing or a sudden change in vision. ? F - Face. Signs are sudden weakness or numbness of the face, or the face or eyelid drooping on one side. ? A - Arms. Signs are weakness or numbness in an arm. This happens suddenly and usually on one side of the body. ? S - Speech. Signs are sudden trouble speaking, slurred speech, or trouble understanding what people say. ? T - Time. Time to call emergency services. Write down what time symptoms started.  You have other signs of a stroke, such as: ? A sudden, severe headache with no known cause. ? Nausea or vomiting. ? Seizure. These symptoms may represent a serious problem that is an emergency. Do not wait to see if the symptoms will go away. Get medical help right away. Call your local emergency services (  911 in the [place] Do not drive yourself to the hospital. Summary  Some fatigue is common after this procedure.  You may have to take blood thinners (anticoagulants) or medicines to control your heart rhythm.  If you have symptoms of a stroke, get help right away. "BE FAST" is an easy way to remember the main warning signs of a stroke. This information is not intended to replace advice given to you by your health care provider. Make sure you discuss any questions you have with your health care provider. Document Revised: 02/13/2019 Document Reviewed: 02/13/2019 Elsevier Patient Education  [place].

## 2020-05-10 NOTE — Telephone Encounter (Signed)
Pt c/o medication issue:  1. Name of Medication: metoprolol   2. How are you currently taking this medication (dosage and times per day)? Please confirm dose   3. Are you having a reaction (difficulty breathing--STAT)?  No   4. What is your medication issue?  Patient is not sure what new dose is

## 2020-05-10 NOTE — Addendum Note (Signed)
Addended by: Kate Sable on: 05/10/2020 03:47 PM   Modules accepted: Orders, SmartSet

## 2020-05-10 NOTE — H&P (View-Only) (Signed)
Cardiology Office Note:    Date:  05/10/2020   ID:  Victoria Williamson, DOB June 02, 1966, MRN [id number]  PCP:  Elby Beck, [facility] Cardiologist:  Kate Sable, MD  [place] Electrophysiologist:  None   Referring MD: Merlyn Lot, MD   Chief Complaint  Patient presents with  . OTHER    F/u [facility] ED afib c/o sob and throat discomfort w/irregular heart beat. Meds reviewed verbally with pt.    Victoria Williamson is a 54 y.o. female who is being seen today for the evaluation of irregular heartbeats, at the request of Merlyn Lot, MD.   History of Present Illness:    Victoria Williamson is a 54 y.o. female with a hx of hypertension, paroxysmal atrial fibrillation, SVT who presents due to A. fib.  Patient was seen in the emergency room 2 months ago due to palpitations and weakness.  EKG on 03/17/2020 while in the ED showed SVT, heart rate 147.  Blood pressures were low with systolic in the 42A.  Patient given IV fluids with spontaneous conversion of rhythm to sinus rhythm.  She was then discharged home with recommendations for outpatient monitoring.  Review of EMR showed patient had a PET myocardial perfusion stress test 01/2020 with no evidence of ischemia.  Echocardiogram on 12/2019 showed normal systolic function, normal wall thickness, EF 76%, grade 2 diastolic dysfunction.  Had a left heart cath in 2014 and was told she had normal coronary arteries.  Patient states having symptoms of weakness/fatigue, shortness of breath whenever she goes into atrial fibrillation.  Initially diagnosed with atrial fibrillation in February 2021.  She states having the symptoms including shortness of breath and weakness, last evening.  In the office today, patient noted to be in A. fib RVR.  She was given Metroprolol tartrate 50 mg x 1 without much improvement in heart rates.  She was then given Cardizem 60 mg with improvement in heart rates to 80s.  Past Medical  History:  Diagnosis Date  . Anxiety   . Arthritis   . Chest pain    normal coronary arteries by cardiac cath oct 29,2014  . Colon polyp   . Depression   . Dysautonomia ([place]) 12/18/2013  . Heart disease   . History of blood transfusion   . Hypertension   . Thyroid nodule     Past Surgical History:  Procedure Laterality Date  . btl    . ENDOMETRIAL ABLATION    . right carpal tunel      Current Medications: Current Meds  Medication Sig  . ALPRAZolam (XANAX) 0.25 MG tablet Take 0.25 mg by mouth daily as needed.  . furosemide (LASIX) 20 MG tablet Take 20 mg by mouth daily.  . sertraline (ZOLOFT) 50 MG tablet Take 50 mg by mouth daily.  . traMADol (ULTRAM) 50 MG tablet Take 50 mg by mouth 2 (two) times daily.  . [DISCONTINUED] [employer] ASPIRIN ADULT LOW DOSE 81 MG chewable tablet Chew 81 mg by mouth daily.  . [DISCONTINUED] losartan (COZAAR) 100 MG tablet Take 100 mg by mouth at bedtime.   . [DISCONTINUED] metoprolol succinate (TOPROL XL) 25 MG 24 hr tablet Take 1 tablet (25 mg total) by mouth at bedtime.     Allergies:   Codeine, Codeine sulfate, and Paroxetine hcl   Social History   Socioeconomic History  . Marital status: Married    Spouse name: Not on file  . Number of children: Not on file  . Years  of education: Not on file  . Highest education level: Not on file  Occupational History  . Not on file  Tobacco Use  . Smoking status: Former Smoker    Types: Cigarettes    Quit date: 09/29/2019    Years since quitting: 0.6  . Smokeless tobacco: Never Used  Substance and Sexual Activity  . Alcohol use: Never  . Drug use: Never  . Sexual activity: Yes    Partners: Male  Other Topics Concern  . Not on file  Social History Narrative  . Not on file   Social Determinants of Health   Financial Resource Strain:   . Difficulty of Paying Living Expenses: Not on file  Food Insecurity:   . Worried About Charity fundraiser in the Last Year: Not on file  . Ran Out of Food in  the Last Year: Not on file  Transportation Needs:   . Lack of Transportation (Medical): Not on file  . Lack of Transportation (Non-Medical): Not on file  Physical Activity:   . Days of Exercise per Week: Not on file  . Minutes of Exercise per Session: Not on file  Stress:   . Feeling of Stress : Not on file  Social Connections:   . Frequency of Communication with Friends and Family: Not on file  . Frequency of Social Gatherings with Friends and Family: Not on file  . Attends Religious Services: Not on file  . Active Member of Clubs or Organizations: Not on file  . Attends Archivist Meetings: Not on file  . Marital Status: Not on file     Family History: The patient's family history includes Alcohol abuse in her father; Arthritis in her mother; Breast cancer (age of onset: 40) in her maternal aunt; Breast cancer (age of onset: 74) in her maternal grandmother; COPD in her mother; Depression in her paternal grandmother; Diabetes in her mother and another family member; Hearing loss in her father; Heart attack in her brother, father, and paternal grandfather; Heart disease in her father, mother, and paternal grandfather; High Cholesterol in her father and mother; Hypertension in her father and mother; Kidney disease in her mother.  ROS:   Please see the history of present illness.     All other systems reviewed and are negative.  EKGs/Labs/Other Studies Reviewed:    The following studies were reviewed today: Outside echo 12/2019 Summary   1. The left ventricle is normal in size with normal wall thickness.   2. The left ventricular systolic function is normal, LVEF is visually  estimated at > 55%.   3. There is grade II diastolic dysfunction (elevated filling pressure).   4. There is mild aortic regurgitation.   5. The right ventricle is normal in size, with normal systolic function.   6. The aorta at the ascending aorta is mildly dilated.   PET MPI  01/2020 Impressions:  - Normal myocardial perfusion study  - No evidence for significant ischemia or scar is noted.  - During stress: Global systolic function is normal. The ejection fraction was greater than 65%.  - No significant coronary calcifications were noted on the attenuation CT.  - Incidentally noted on the attenuation CT scan is a dilated ascending aorta (4.3 cm in diameter) onnoncontrasted CT.     EKG:  EKG is  ordered today.  The ekg ordered today demonstrates atrial fibrillation with rapid ventricular response  Recent Labs: 12/04/2019: B Natriuretic Peptide 56.0; TSH 6.513 03/17/2020: ALT 97; BUN 11;  Creatinine, Ser 0.90; Hemoglobin 13.9; Platelets 227; Potassium 3.4; Sodium 140  Recent Lipid Panel No results found for: CHOL, TRIG, HDL, CHOLHDL, VLDL, LDLCALC, LDLDIRECT  Physical Exam:    VS:  BP 118/80 (BP Location: Right Arm, Patient Position: Sitting, Cuff Size: Normal)   Pulse (!) 174   Ht 5\' 5"  (1.651 m)   Wt 203 lb (92.1 kg)   SpO2 99%   BMI 33.78 kg/m     Wt Readings from Last 3 Encounters:  05/10/20 203 lb (92.1 kg)  03/28/20 208 lb (94.3 kg)  03/17/20 213 lb (96.6 kg)     GEN:  Well nourished, well developed in no acute distress HEENT: Normal NECK: No JVD; No carotid bruits LYMPHATICS: No lymphadenopathy CARDIAC: Irregular irregular, tachycardic. RESPIRATORY:  Clear to auscultation without rales, wheezing or rhonchi  ABDOMEN: Soft, non-tender, non-distended MUSCULOSKELETAL:  No edema; No deformity  SKIN: Warm and dry NEUROLOGIC:  Alert and oriented x 3 PSYCHIATRIC:  Normal affect   ASSESSMENT:    1. Paroxysmal A-fib ([place])   2. Primary hypertension    PLAN:    In order of problems listed above:  1. A. fib with RVR noted on ECG today.  CHA2DS2-VASc score 2 (htn, gender).  Patient given 50 mg Metroprolol tartrate without improvement in heart rate.  Cardizem 60 mg x 1 given with heart rate improvement to 60s.  Start Cardizem 60 mg 3 times daily,  start Lopressor 25 mg twice daily.  Patient has been on Eliquis uninterrupted over the past 6 months.  We will plan for cardioversion in the hospital in the a.m.  Previous stress test and left heart cath with no evidence of CAD.  May consider flecainide after cardioversion if successful.  Will refer to electrophysiology for antiarrhythmic versus ablation consideration due to symptomatic atrial fibrillation.  Plan for repeat echocardiogram. 2. History of hypertension, stpp losartan.  Start Lopressor and Cardizem as above.  Follow-up in 2 weeks.  Total encounter time more than 80 minutes  Greater than 50% was spent in counseling and coordination of care with the patient    Medication Adjustments/Labs and Tests Ordered: Current medicines are reviewed at length with the patient today.  Concerns regarding medicines are outlined above.  Orders Placed This Encounter  Procedures  . CBC  . Basic metabolic panel  . Ambulatory referral to Cardiac Electrophysiology  . EKG 12-Lead  . ECHOCARDIOGRAM COMPLETE   Meds ordered this encounter  Medications  . metoprolol tartrate (LOPRESSOR) tablet 50 mg  . diltiazem (CARDIZEM) tablet 60 mg  . DISCONTD: metoprolol tartrate (LOPRESSOR) 50 MG tablet    Sig: Take 1 tablet (50 mg total) by mouth 2 (two) times daily.    Dispense:  60 tablet    Refill:  5  . diltiazem (CARDIZEM) 60 MG tablet    Sig: Take 1 tablet (60 mg total) by mouth 3 (three) times daily.    Dispense:  90 tablet    Refill:  5  . flecainide (TAMBOCOR) 50 MG tablet    Sig: Take 1 tablet (50 mg total) by mouth 2 (two) times daily.    Dispense:  60 tablet    Refill:  5  . metoprolol tartrate (LOPRESSOR) 25 MG tablet    Sig: Take 1 tablet (25 mg total) by mouth 2 (two) times daily.    Dispense:  60 tablet    Refill:  5    Patient Instructions  Medication Instructions:  Your physician has recommended you make the following  change in your medication:   1)  STOP taking metoprolol  succinate (TOPROL XL) 25 MG 24 hr tablet. 2)  STOP taking ASPIRIN ADULT LOW DOSE 81 MG chewable tablet. 3)  STOP taking losartan (COZAAR) 100 MG tablet.   4)  START taking diltiazem (CARDIZEM) 60 MG tablet: Take 1 tablet (60 mg total) by mouth 3 (three) times daily.       -YOUR NEXT DOSE IS DUE AT NOON TODAY.  5)  START taking metoprolol tartrate (LOPRESSOR) 25 MG tablet: Take 1 tablet (25 mg total) by mouth 2 (two) times daily.  6)  START taking flecainide (TAMBOCOR) 50 MG tablet: Take 1 tablet (50 mg total) by mouth 2 (two) times daily.      -DO NOT START THIS MEDICATION UNTIL AFTER CARDIOVERSION  *If you need a refill on your cardiac medications before your next appointment, please call your pharmacy*   Lab Work: BMP, CBC to be drawn today.  If you have labs (blood work) drawn today and your tests are completely normal, you will receive your results only by: Marland Kitchen MyChart Message (if you have MyChart) OR . A paper copy in the mail If you have any lab test that is abnormal or we need to change your treatment, we will call you to review the results.   Testing/Procedures:  You are scheduled for a Cardioversion on _____10/6/21___________ with Dr.____Agbor-Etang_______ Please arrive at the [facility] at ____0630_____ a.m. on the day of your procedure.  DIET INSTRUCTIONS:  Nothing to eat or drink after midnight except your medications with a  sip of water.         1) Labs: ____BMP, CBC (done in office)______________  2) Medications:  Hold your furosemide (LASIX) 20 MG tablet YOU MAY TAKE all your remaining medications with a small amount of water.  3) Must have a responsible person to drive you home.  4) Bring a current list of your medications and current insurance cards.    If you have any questions after you get home, please call the office at [phone]    Follow-Up: At [facility], you and your health needs are our priority.  As part of our continuing  mission to provide you with exceptional heart care, we have created designated Provider Care Teams.  These Care Teams include your primary Cardiologist (physician) and Advanced Practice Providers (APPs -  Physician Assistants and Nurse Practitioners) who all work together to provide you with the care you need, when you need it.  We recommend signing up for the patient portal called "MyChart".  Sign up information is provided on this After Visit Summary.  MyChart is used to connect with patients for Virtual Visits (Telemedicine).  Patients are able to view lab/test results, encounter notes, upcoming appointments, etc.  Non-urgent messages can be sent to your provider as well.   To learn more about what you can do with MyChart, go to [email or web address].    Your next appointment:   2 week(s)  The format for your next appointment:   In Person  Provider:   Kate Sable, MD  ONLY   Other Instructions   Chemical Cardioversion, Care After This sheet gives you information about how to care for yourself after your procedure. Your health care provider may also give you more specific instructions. If you have problems or questions, contact your health care provider. What can I expect after the procedure? After the procedure, it is common to have:  Tiredness (fatigue).  Mild throat discomfort, if you had a test to look for blood clots in your heart (transesophageal echocardiogram, TEE). Follow these instructions at home: Medicines  Take over-the-counter and prescription medicines only as told by your health care provider. You may need to take blood thinners (anticoagulants) or medicines to control your heart rhythm.  If you are taking blood thinners: ? Talk with your health care provider before you take any medicines that contain aspirin or NSAIDs, such as ibuprofen. These medicines increase your risk for dangerous bleeding. ? Take your medicine exactly as told, at the same time every  day. ? Avoid activities that could cause injury or bruising, and follow instructions about how to prevent falls. ? Wear a medical alert bracelet or carry a card that lists what medicines you take. Eating and drinking   Follow instructions from your health care provider about eating and drinking restrictions. You may have to follow a low-salt (low-sodium), low-fat, and low-cholesterol diet.  Drink enough fluid to keep your urine pale yellow.  Do not drink alcohol if: ? Your health care provider tells you not to drink. ? You are pregnant, may be pregnant, or are planning to become pregnant.  If you drink alcohol: ? Limit how much you use to:  0-1 drink a day for women.  0-2 drinks a day for men. ? Be aware of how much alcohol is in your drink. In the [place] one drink equals one 12 oz bottle of beer (355 mL), one 5 oz glass of wine (148 mL), or one 1 oz glass of hard liquor (44 mL). Activity  Ask your health care provider what activities are safe for you.  Do not drive for 24 hours if you were given a sedative during your procedure. General instructions  Do not use any products that contain nicotine or tobacco, such as cigarettes, e-cigarettes, and chewing tobacco. If you need help quitting, ask your health care provider.  Keep all follow-up visits as told by your health care provider. This is important. Contact a health care provider if you have:  A fever.  Severe pain, and medicines do not help.  Problems taking your medicines.  Irregular heartbeats. Get help right away if:   Your heartbeat becomes very fast or slow.  You have chest pain or shortness of breath.  You feel dizzy.  You faint.  You have any symptoms of a stroke. "BE FAST" is an easy way to remember the main warning signs of a stroke: ? B - Balance. Signs are dizziness, sudden trouble walking, or loss of balance. ? E - Eyes. Signs are trouble seeing or a sudden change in vision. ? F - Face. Signs are  sudden weakness or numbness of the face, or the face or eyelid drooping on one side. ? A - Arms. Signs are weakness or numbness in an arm. This happens suddenly and usually on one side of the body. ? S - Speech. Signs are sudden trouble speaking, slurred speech, or trouble understanding what people say. ? T - Time. Time to call emergency services. Write down what time symptoms started.  You have other signs of a stroke, such as: ? A sudden, severe headache with no known cause. ? Nausea or vomiting. ? Seizure. These symptoms may represent a serious problem that is an emergency. Do not wait to see if the symptoms will go away. Get medical help right away. Call your local emergency services (911 in the [place] Do not drive yourself to the  hospital. Summary  Some fatigue is common after this procedure.  You may have to take blood thinners (anticoagulants) or medicines to control your heart rhythm.  If you have symptoms of a stroke, get help right away. "BE FAST" is an easy way to remember the main warning signs of a stroke. This information is not intended to replace advice given to you by your health care provider. Make sure you discuss any questions you have with your health care provider. Document Revised: 02/13/2019 Document Reviewed: 02/13/2019 Elsevier Patient Education  2020 [employer], Kate Sable, MD  05/10/2020 10:05 AM    [place]

## 2020-05-10 NOTE — Progress Notes (Signed)
Cardiology Office Note:    Date:  05/10/2020   ID:  Victoria Williamson, DOB 12-04-1965, MRN [id number]  PCP:  Elby Beck, [facility] Cardiologist:  Kate Sable, MD  [place] Electrophysiologist:  None   Referring MD: Merlyn Lot, MD   Chief Complaint  Patient presents with  . OTHER    F/u [facility] ED afib c/o sob and throat discomfort w/irregular heart beat. Meds reviewed verbally with pt.    Victoria Williamson is a 54 y.o. female who is being seen today for the evaluation of irregular heartbeats, at the request of Merlyn Lot, MD.   History of Present Illness:    Victoria Williamson is a 54 y.o. female with a hx of hypertension, paroxysmal atrial fibrillation, SVT who presents due to A. fib.  Patient was seen in the emergency room 2 months ago due to palpitations and weakness.  EKG on 03/17/2020 while in the ED showed SVT, heart rate 147.  Blood pressures were low with systolic in the 78L.  Patient given IV fluids with spontaneous conversion of rhythm to sinus rhythm.  She was then discharged home with recommendations for outpatient monitoring.  Review of EMR showed patient had a PET myocardial perfusion stress test 01/2020 with no evidence of ischemia.  Echocardiogram on 12/2019 showed normal systolic function, normal wall thickness, EF 38%, grade 2 diastolic dysfunction.  Had a left heart cath in 2014 and was told she had normal coronary arteries.  Patient states having symptoms of weakness/fatigue, shortness of breath whenever she goes into atrial fibrillation.  Initially diagnosed with atrial fibrillation in February 2021.  She states having the symptoms including shortness of breath and weakness, last evening.  In the office today, patient noted to be in A. fib RVR.  She was given Metroprolol tartrate 50 mg x 1 without much improvement in heart rates.  She was then given Cardizem 60 mg with improvement in heart rates to 80s.  Past Medical  History:  Diagnosis Date  . Anxiety   . Arthritis   . Chest pain    normal coronary arteries by cardiac cath oct 29,2014  . Colon polyp   . Depression   . Dysautonomia ([place]) 12/18/2013  . Heart disease   . History of blood transfusion   . Hypertension   . Thyroid nodule     Past Surgical History:  Procedure Laterality Date  . btl    . ENDOMETRIAL ABLATION    . right carpal tunel      Current Medications: Current Meds  Medication Sig  . ALPRAZolam (XANAX) 0.25 MG tablet Take 0.25 mg by mouth daily as needed.  . furosemide (LASIX) 20 MG tablet Take 20 mg by mouth daily.  . sertraline (ZOLOFT) 50 MG tablet Take 50 mg by mouth daily.  . traMADol (ULTRAM) 50 MG tablet Take 50 mg by mouth 2 (two) times daily.  . [DISCONTINUED] [employer] ASPIRIN ADULT LOW DOSE 81 MG chewable tablet Chew 81 mg by mouth daily.  . [DISCONTINUED] losartan (COZAAR) 100 MG tablet Take 100 mg by mouth at bedtime.   . [DISCONTINUED] metoprolol succinate (TOPROL XL) 25 MG 24 hr tablet Take 1 tablet (25 mg total) by mouth at bedtime.     Allergies:   Codeine, Codeine sulfate, and Paroxetine hcl   Social History   Socioeconomic History  . Marital status: Married    Spouse name: Not on file  . Number of children: Not on file  . Years  of education: Not on file  . Highest education level: Not on file  Occupational History  . Not on file  Tobacco Use  . Smoking status: Former Smoker    Types: Cigarettes    Quit date: 09/29/2019    Years since quitting: 0.6  . Smokeless tobacco: Never Used  Substance and Sexual Activity  . Alcohol use: Never  . Drug use: Never  . Sexual activity: Yes    Partners: Male  Other Topics Concern  . Not on file  Social History Narrative  . Not on file   Social Determinants of Health   Financial Resource Strain:   . Difficulty of Paying Living Expenses: Not on file  Food Insecurity:   . Worried About Charity fundraiser in the Last Year: Not on file  . Ran Out of Food in  the Last Year: Not on file  Transportation Needs:   . Lack of Transportation (Medical): Not on file  . Lack of Transportation (Non-Medical): Not on file  Physical Activity:   . Days of Exercise per Week: Not on file  . Minutes of Exercise per Session: Not on file  Stress:   . Feeling of Stress : Not on file  Social Connections:   . Frequency of Communication with Friends and Family: Not on file  . Frequency of Social Gatherings with Friends and Family: Not on file  . Attends Religious Services: Not on file  . Active Member of Clubs or Organizations: Not on file  . Attends Archivist Meetings: Not on file  . Marital Status: Not on file     Family History: The patient's family history includes Alcohol abuse in her father; Arthritis in her mother; Breast cancer (age of onset: 63) in her maternal aunt; Breast cancer (age of onset: 20) in her maternal grandmother; COPD in her mother; Depression in her paternal grandmother; Diabetes in her mother and another family member; Hearing loss in her father; Heart attack in her brother, father, and paternal grandfather; Heart disease in her father, mother, and paternal grandfather; High Cholesterol in her father and mother; Hypertension in her father and mother; Kidney disease in her mother.  ROS:   Please see the history of present illness.     All other systems reviewed and are negative.  EKGs/Labs/Other Studies Reviewed:    The following studies were reviewed today: Outside echo 12/2019 Summary   1. The left ventricle is normal in size with normal wall thickness.   2. The left ventricular systolic function is normal, LVEF is visually  estimated at > 55%.   3. There is grade II diastolic dysfunction (elevated filling pressure).   4. There is mild aortic regurgitation.   5. The right ventricle is normal in size, with normal systolic function.   6. The aorta at the ascending aorta is mildly dilated.   PET MPI  01/2020 Impressions:  - Normal myocardial perfusion study  - No evidence for significant ischemia or scar is noted.  - During stress: Global systolic function is normal. The ejection fraction was greater than 65%.  - No significant coronary calcifications were noted on the attenuation CT.  - Incidentally noted on the attenuation CT scan is a dilated ascending aorta (4.3 cm in diameter) onnoncontrasted CT.     EKG:  EKG is  ordered today.  The ekg ordered today demonstrates atrial fibrillation with rapid ventricular response  Recent Labs: 12/04/2019: B Natriuretic Peptide 56.0; TSH 6.513 03/17/2020: ALT 97; BUN 11;  Creatinine, Ser 0.90; Hemoglobin 13.9; Platelets 227; Potassium 3.4; Sodium 140  Recent Lipid Panel No results found for: CHOL, TRIG, HDL, CHOLHDL, VLDL, LDLCALC, LDLDIRECT  Physical Exam:    VS:  BP 118/80 (BP Location: Right Arm, Patient Position: Sitting, Cuff Size: Normal)   Pulse (!) 174   Ht 5\' 5"  (1.651 m)   Wt 203 lb (92.1 kg)   SpO2 99%   BMI 33.78 kg/m     Wt Readings from Last 3 Encounters:  05/10/20 203 lb (92.1 kg)  03/28/20 208 lb (94.3 kg)  03/17/20 213 lb (96.6 kg)     GEN:  Well nourished, well developed in no acute distress HEENT: Normal NECK: No JVD; No carotid bruits LYMPHATICS: No lymphadenopathy CARDIAC: Irregular irregular, tachycardic. RESPIRATORY:  Clear to auscultation without rales, wheezing or rhonchi  ABDOMEN: Soft, non-tender, non-distended MUSCULOSKELETAL:  No edema; No deformity  SKIN: Warm and dry NEUROLOGIC:  Alert and oriented x 3 PSYCHIATRIC:  Normal affect   ASSESSMENT:    1. Paroxysmal A-fib ([place])   2. Primary hypertension    PLAN:    In order of problems listed above:  1. A. fib with RVR noted on ECG today.  CHA2DS2-VASc score 2 (htn, gender).  Patient given 50 mg Metroprolol tartrate without improvement in heart rate.  Cardizem 60 mg x 1 given with heart rate improvement to 60s.  Start Cardizem 60 mg 3 times daily,  start Lopressor 25 mg twice daily.  Patient has been on Eliquis uninterrupted over the past 6 months.  We will plan for cardioversion in the hospital in the a.m.  Previous stress test and left heart cath with no evidence of CAD.  May consider flecainide after cardioversion if successful.  Will refer to electrophysiology for antiarrhythmic versus ablation consideration due to symptomatic atrial fibrillation.  Plan for repeat echocardiogram. 2. History of hypertension, stpp losartan.  Start Lopressor and Cardizem as above.  Follow-up in 2 weeks.  Total encounter time more than 80 minutes  Greater than 50% was spent in counseling and coordination of care with the patient    Medication Adjustments/Labs and Tests Ordered: Current medicines are reviewed at length with the patient today.  Concerns regarding medicines are outlined above.  Orders Placed This Encounter  Procedures  . CBC  . Basic metabolic panel  . Ambulatory referral to Cardiac Electrophysiology  . EKG 12-Lead  . ECHOCARDIOGRAM COMPLETE   Meds ordered this encounter  Medications  . metoprolol tartrate (LOPRESSOR) tablet 50 mg  . diltiazem (CARDIZEM) tablet 60 mg  . DISCONTD: metoprolol tartrate (LOPRESSOR) 50 MG tablet    Sig: Take 1 tablet (50 mg total) by mouth 2 (two) times daily.    Dispense:  60 tablet    Refill:  5  . diltiazem (CARDIZEM) 60 MG tablet    Sig: Take 1 tablet (60 mg total) by mouth 3 (three) times daily.    Dispense:  90 tablet    Refill:  5  . flecainide (TAMBOCOR) 50 MG tablet    Sig: Take 1 tablet (50 mg total) by mouth 2 (two) times daily.    Dispense:  60 tablet    Refill:  5  . metoprolol tartrate (LOPRESSOR) 25 MG tablet    Sig: Take 1 tablet (25 mg total) by mouth 2 (two) times daily.    Dispense:  60 tablet    Refill:  5    Patient Instructions  Medication Instructions:  Your physician has recommended you make the following  change in your medication:   1)  STOP taking metoprolol  succinate (TOPROL XL) 25 MG 24 hr tablet. 2)  STOP taking ASPIRIN ADULT LOW DOSE 81 MG chewable tablet. 3)  STOP taking losartan (COZAAR) 100 MG tablet.   4)  START taking diltiazem (CARDIZEM) 60 MG tablet: Take 1 tablet (60 mg total) by mouth 3 (three) times daily.       -YOUR NEXT DOSE IS DUE AT NOON TODAY.  5)  START taking metoprolol tartrate (LOPRESSOR) 25 MG tablet: Take 1 tablet (25 mg total) by mouth 2 (two) times daily.  6)  START taking flecainide (TAMBOCOR) 50 MG tablet: Take 1 tablet (50 mg total) by mouth 2 (two) times daily.      -DO NOT START THIS MEDICATION UNTIL AFTER CARDIOVERSION  *If you need a refill on your cardiac medications before your next appointment, please call your pharmacy*   Lab Work: BMP, CBC to be drawn today.  If you have labs (blood work) drawn today and your tests are completely normal, you will receive your results only by: Marland Kitchen MyChart Message (if you have MyChart) OR . A paper copy in the mail If you have any lab test that is abnormal or we need to change your treatment, we will call you to review the results.   Testing/Procedures:  You are scheduled for a Cardioversion on _____10/6/21___________ with Dr.____Agbor-Etang_______ Please arrive at the [place] [facility] at ____0630_____ a.m. on the day of your procedure.  DIET INSTRUCTIONS:  Nothing to eat or drink after midnight except your medications with a  sip of water.         1) Labs: ____BMP, CBC (done in office)______________  2) Medications:  Hold your furosemide (LASIX) 20 MG tablet YOU MAY TAKE all your remaining medications with a small amount of water.  3) Must have a responsible person to drive you home.  4) Bring a current list of your medications and current insurance cards.    If you have any questions after you get home, please call the office at [phone]    Follow-Up: At [facility], you and your health needs are our priority.  As part of our continuing  mission to provide you with exceptional heart care, we have created designated Provider Care Teams.  These Care Teams include your primary Cardiologist (physician) and Advanced Practice Providers (APPs -  Physician Assistants and Nurse Practitioners) who all work together to provide you with the care you need, when you need it.  We recommend signing up for the patient portal called "MyChart".  Sign up information is provided on this After Visit Summary.  MyChart is used to connect with patients for Virtual Visits (Telemedicine).  Patients are able to view lab/test results, encounter notes, upcoming appointments, etc.  Non-urgent messages can be sent to your provider as well.   To learn more about what you can do with MyChart, go to [email or web address].    Your next appointment:   2 week(s)  The format for your next appointment:   In Person  Provider:   Kate Sable, MD  ONLY   Other Instructions   Chemical Cardioversion, Care After This sheet gives you information about how to care for yourself after your procedure. Your health care provider may also give you more specific instructions. If you have problems or questions, contact your health care provider. What can I expect after the procedure? After the procedure, it is common to have:  Tiredness (fatigue).  Mild throat discomfort, if you had a test to look for blood clots in your heart (transesophageal echocardiogram, TEE). Follow these instructions at home: Medicines  Take over-the-counter and prescription medicines only as told by your health care provider. You may need to take blood thinners (anticoagulants) or medicines to control your heart rhythm.  If you are taking blood thinners: ? Talk with your health care provider before you take any medicines that contain aspirin or NSAIDs, such as ibuprofen. These medicines increase your risk for dangerous bleeding. ? Take your medicine exactly as told, at the same time every  day. ? Avoid activities that could cause injury or bruising, and follow instructions about how to prevent falls. ? Wear a medical alert bracelet or carry a card that lists what medicines you take. Eating and drinking   Follow instructions from your health care provider about eating and drinking restrictions. You may have to follow a low-salt (low-sodium), low-fat, and low-cholesterol diet.  Drink enough fluid to keep your urine pale yellow.  Do not drink alcohol if: ? Your health care provider tells you not to drink. ? You are pregnant, may be pregnant, or are planning to become pregnant.  If you drink alcohol: ? Limit how much you use to:  0-1 drink a day for women.  0-2 drinks a day for men. ? Be aware of how much alcohol is in your drink. In the [place] one drink equals one 12 oz bottle of beer (355 mL), one 5 oz glass of wine (148 mL), or one 1 oz glass of hard liquor (44 mL). Activity  Ask your health care provider what activities are safe for you.  Do not drive for 24 hours if you were given a sedative during your procedure. General instructions  Do not use any products that contain nicotine or tobacco, such as cigarettes, e-cigarettes, and chewing tobacco. If you need help quitting, ask your health care provider.  Keep all follow-up visits as told by your health care provider. This is important. Contact a health care provider if you have:  A fever.  Severe pain, and medicines do not help.  Problems taking your medicines.  Irregular heartbeats. Get help right away if:   Your heartbeat becomes very fast or slow.  You have chest pain or shortness of breath.  You feel dizzy.  You faint.  You have any symptoms of a stroke. "BE FAST" is an easy way to remember the main warning signs of a stroke: ? B - Balance. Signs are dizziness, sudden trouble walking, or loss of balance. ? E - Eyes. Signs are trouble seeing or a sudden change in vision. ? F - Face. Signs are  sudden weakness or numbness of the face, or the face or eyelid drooping on one side. ? A - Arms. Signs are weakness or numbness in an arm. This happens suddenly and usually on one side of the body. ? S - Speech. Signs are sudden trouble speaking, slurred speech, or trouble understanding what people say. ? T - Time. Time to call emergency services. Write down what time symptoms started.  You have other signs of a stroke, such as: ? A sudden, severe headache with no known cause. ? Nausea or vomiting. ? Seizure. These symptoms may represent a serious problem that is an emergency. Do not wait to see if the symptoms will go away. Get medical help right away. Call your local emergency services (911 in the [place] Do not drive yourself to the  hospital. Summary  Some fatigue is common after this procedure.  You may have to take blood thinners (anticoagulants) or medicines to control your heart rhythm.  If you have symptoms of a stroke, get help right away. "BE FAST" is an easy way to remember the main warning signs of a stroke. This information is not intended to replace advice given to you by your health care provider. Make sure you discuss any questions you have with your health care provider. Document Revised: 02/13/2019 Document Reviewed: 02/13/2019 Elsevier Patient Education  2020 [employer], Kate Sable, MD  05/10/2020 10:05 AM    [place]

## 2020-05-10 NOTE — Telephone Encounter (Signed)
Spoke with patient and clarified that the Lopressor dose is 25mg  BID. Patient stated that the pharmacy does not have the cardizem available and they ordered it for her to pick up tomorrow. She stated that she just took her BP and it was 88/57, Pulse was 68. Spoke to [employer] PA and he recommended that the patient hold the cardizem dose, and to recheck her BP before her Lopressor dose this evening, if it is under 656 systolic, then hold the Lopressor. Patient is being cardioverted in the AM with Dr. Garen Lah.  Will forward to him for review

## 2020-05-11 ENCOUNTER — Other Ambulatory Visit: Payer: Self-pay

## 2020-05-11 ENCOUNTER — Ambulatory Visit
Admission: RE | Admit: 2020-05-11 | Discharge: 2020-05-11 | Disposition: A | Discharge status: Home / Self Care | Payer: BC Managed Care – PPO | Attending: Cardiology | Admitting: Cardiology

## 2020-05-11 ENCOUNTER — Encounter
Admission: RE | Disposition: A | Discharge status: Home / Self Care | Payer: Self-pay | Source: Home / Self Care | Attending: Cardiology

## 2020-05-11 ENCOUNTER — Encounter: Payer: Self-pay | Admitting: Cardiology

## 2020-05-11 DIAGNOSIS — F419 Anxiety disorder, unspecified: Secondary | ICD-10-CM | POA: Insufficient documentation

## 2020-05-11 DIAGNOSIS — Z87891 Personal history of nicotine dependence: Secondary | ICD-10-CM | POA: Diagnosis not present

## 2020-05-11 DIAGNOSIS — Z79899 Other long term (current) drug therapy: Secondary | ICD-10-CM | POA: Diagnosis not present

## 2020-05-11 DIAGNOSIS — Z885 Allergy status to narcotic agent status: Secondary | ICD-10-CM | POA: Diagnosis not present

## 2020-05-11 DIAGNOSIS — Z888 Allergy status to other drugs, medicaments and biological substances status: Secondary | ICD-10-CM | POA: Diagnosis not present

## 2020-05-11 DIAGNOSIS — I480 Paroxysmal atrial fibrillation: Secondary | ICD-10-CM | POA: Diagnosis present

## 2020-05-11 DIAGNOSIS — Z538 Procedure and treatment not carried out for other reasons: Secondary | ICD-10-CM | POA: Insufficient documentation

## 2020-05-11 DIAGNOSIS — M1990 Unspecified osteoarthritis, unspecified site: Secondary | ICD-10-CM | POA: Insufficient documentation

## 2020-05-11 DIAGNOSIS — Z86010 Personal history of colonic polyps: Secondary | ICD-10-CM | POA: Diagnosis not present

## 2020-05-11 DIAGNOSIS — G901 Familial dysautonomia [Riley-Day]: Secondary | ICD-10-CM | POA: Diagnosis not present

## 2020-05-11 DIAGNOSIS — I10 Essential (primary) hypertension: Secondary | ICD-10-CM | POA: Insufficient documentation

## 2020-05-11 HISTORY — PX: CARDIOVERSION: SHX1299

## 2020-05-11 LAB — CBC
Hematocrit: 41.2 % (ref 34.0–46.6)
Hemoglobin: 13.8 g/dL (ref 11.1–15.9)
MCH: 30.8 pg (ref 26.6–33.0)
MCHC: 33.5 g/dL (ref 31.5–35.7)
MCV: 92 fL (ref 79–97)
Platelets: 217 10*3/uL (ref 150–450)
RBC: 4.48 x10E6/uL (ref 3.77–5.28)
RDW: 12.7 % (ref 11.7–15.4)
WBC: 10.3 10*3/uL (ref 3.4–10.8)

## 2020-05-11 LAB — BASIC METABOLIC PANEL
BUN/Creatinine Ratio: 10 (ref 9–23)
BUN: 7 mg/dL (ref 6–24)
CO2: 22 mmol/L (ref 20–29)
Calcium: 9.6 mg/dL (ref 8.7–10.2)
Chloride: 105 mmol/L (ref 96–106)
Creatinine, Ser: 0.71 mg/dL (ref 0.57–1.00)
GFR calc Af Amer: 112 mL/min/{1.73_m2} (ref >59)
GFR calc non Af Amer: 98 mL/min/{1.73_m2} (ref >59)
Glucose: 85 mg/dL (ref 65–99)
Potassium: 4 mmol/L (ref 3.5–5.2)
Sodium: 140 mmol/L (ref 134–144)

## 2020-05-11 SURGERY — CARDIOVERSION
Anesthesia: General

## 2020-05-11 NOTE — Anesthesia Preprocedure Evaluation (Deleted)
Anesthesia Evaluation  Patient identified by MRN, date of birth, ID band Patient awake    Reviewed: Allergy & Precautions, H&P , NPO status , Patient's Chart, lab work & pertinent test results  History of Anesthesia Complications Negative for: history of anesthetic complications  Airway Mallampati: III  TM Distance: <3 FB Neck ROM: limited    Dental  (+) Chipped   Pulmonary neg pulmonary ROS, neg shortness of breath, former smoker,    Pulmonary exam normal        Cardiovascular Exercise Tolerance: Good hypertension, (-) angina(-) Past MI and (-) DOE Normal cardiovascular exam     Neuro/Psych PSYCHIATRIC DISORDERS  Neuromuscular disease negative psych ROS   GI/Hepatic Neg liver ROS, GERD  Medicated and Controlled,  Endo/Other  negative endocrine ROS  Renal/GU negative Renal ROS  negative genitourinary   Musculoskeletal  (+) Arthritis ,   Abdominal   Peds  Hematology negative hematology ROS (+)   Anesthesia Other Findings Past Medical History: No date: Anxiety No date: Arthritis No date: Chest pain     Comment:  normal coronary arteries by cardiac cath oct 29,2014 No date: Colon polyp No date: Depression 12/18/2013: Dysautonomia ([place]) No date: Heart disease No date: History of blood transfusion No date: Hypertension No date: Thyroid nodule  Past Surgical History: No date: btl No date: ENDOMETRIAL ABLATION No date: right carpal tunel  BMI    Body Mass Index: 33.78 kg/m      Reproductive/Obstetrics negative OB ROS                             Anesthesia Physical Anesthesia Plan  ASA: III  Anesthesia Plan: General   Post-op Pain Management:    Induction: Intravenous  PONV Risk Score and Plan: Propofol infusion and TIVA  Airway Management Planned: Natural Airway and Nasal Cannula  Additional Equipment:   Intra-op Plan:   Post-operative Plan:   Informed Consent: I  have reviewed the patients History and Physical, chart, labs and discussed the procedure including the risks, benefits and alternatives for the proposed anesthesia with the patient or authorized representative who has indicated his/her understanding and acceptance.     Dental Advisory Given  Plan Discussed with: Anesthesiologist, CRNA and Surgeon  Anesthesia Plan Comments: (Patient consented for risks of anesthesia including but not limited to:  - adverse reactions to medications - risk of intubation if required - damage to eyes, teeth, lips or other oral mucosa - nerve damage due to positioning  - sore throat or hoarseness - Damage to heart, brain, nerves, lungs, other parts of body or loss of life  Patient voiced understanding.)        Anesthesia Quick Evaluation

## 2020-05-11 NOTE — Progress Notes (Signed)
Patient was in normal sinus rhythm and Dr. Garen Lah came by to confirm. Procedure was cancelled and patient received new medication administration orders from Dr. Garen Lah. Patient has return visit to cardiologist scheduled. Patient stated understanding.

## 2020-05-11 NOTE — Progress Notes (Signed)
Patient presents today for DC cardioversion due to history of symptomatic paroxysmal atrial fibrillation.  EKG/telemetry reviewed by myself showing sinus rhythm, heart rate 62.  Procedure was therefore not performed.  Instructions were given for patient to start flecainide 50 mg twice daily as previously mentioned in the office.  Refer to office notes for details.  Follow-up with myself in the office in about 2 weeks.  We will schedule appointment for patient to see electrophysiology as previously mentioned for additional input and ablation consideration.  Signed, Kate Sable, M.D. 05/11/20 [place], [place]

## 2020-05-11 NOTE — Interval H&P Note (Signed)
History and Physical Interval Note:  05/11/2020 12:30 PM  Patient presents today for DC cardioversion due to history of symptomatic paroxysmal atrial fibrillation.   EKG/telemetry reviewed by myself showing sinus rhythm, heart rate 62.   Procedure was therefore not performed.  Instructions were given for patient to start flecainide 50 mg twice daily as previously mentioned in the office.  Refer to office notes for details.   Follow-up with myself in the office in about 2 weeks.   We will schedule appointment for patient to see electrophysiology as previously mentioned for additional input and ablation consideration.   Signed, Kate Sable, M.D. 05/11/20 [place], [place]

## 2020-05-12 ENCOUNTER — Encounter: Payer: Self-pay | Admitting: Cardiology

## 2020-05-13 ENCOUNTER — Telehealth: Payer: Self-pay | Admitting: Cardiology

## 2020-05-13 DIAGNOSIS — Z0279 Encounter for issue of other medical certificate: Secondary | ICD-10-CM

## 2020-05-13 NOTE — Telephone Encounter (Signed)
Forms from ___ received on 05/13/20. Completed patient authorizations attached. Placed form in nurse box for completion.  TB 05/13/2020

## 2020-05-17 ENCOUNTER — Telehealth: Payer: Self-pay | Admitting: Cardiology

## 2020-05-17 MED ORDER — METOPROLOL TARTRATE 25 MG PO TABS: 12.5000 mg | ORAL_TABLET | Freq: Two times a day (BID) | ORAL | 5 refills | Status: DC

## 2020-05-17 MED ORDER — FLECAINIDE ACETATE 50 MG PO TABS
50.0000 mg | ORAL_TABLET | Freq: Every day | ORAL | 5 refills | Status: DC
Start: 2020-05-17 — End: 2020-05-20

## 2020-05-17 NOTE — Addendum Note (Signed)
Addended by: Kavin Leech on: 05/17/2020 11:58 AM   Modules accepted: Orders

## 2020-05-17 NOTE — Telephone Encounter (Signed)
Pt c/o medication issue:  1. Name of Medication: flecainide and metoprolol   2. How are you currently taking this medication (dosage and times per day)? 20 mg po BID and 25 mg po BID  3. Are you having a reaction (difficulty breathing--STAT)? Weakness feels so exhausted spot on the back of head interim pain does not feel well  Unable to work today  4. What is your medication issue?   Patient is not sure if these new meds are causing issue  Vitals this morning   118/62  BP 66 HR

## 2020-05-17 NOTE — Telephone Encounter (Signed)
Called patient and relayed Dr. Thereasa Solo recommendation as follows: Decrease flecainide to 50 mg 1 tablet daily. Decrease Lopressor to 12.5 mg twice daily. Monitor symptoms closely. If symptoms persist, will plan to stop flecainide completely. Thank you    Patient verbalized understanding and agreed with plan. Updated medications accordingly.

## 2020-05-20 ENCOUNTER — Telehealth: Payer: Self-pay | Admitting: Cardiology

## 2020-05-20 MED ORDER — FLECAINIDE ACETATE 50 MG PO TABS
50.0000 mg | ORAL_TABLET | Freq: Two times a day (BID) | ORAL | 5 refills | Status: DC
Start: 2020-05-20 — End: 2020-08-12

## 2020-05-20 NOTE — Telephone Encounter (Signed)
Pt c/o of Chest Pain: STAT if CP now or developed within 24 hours  1. Are you having CP right now? Yes tightness   2. Are you experiencing any other symptoms (ex. SOB, nausea, vomiting, sweating)? SOB tired   3. How long have you been experiencing CP? This morning about 7   4. Is your CP continuous or coming and going? Comes and goes sob with exertion and squeezing starts   5. Have you taken Nitroglycerin? No but took morning meds 1/2 metoprolol eliquis and flecainide   BP last night 147/108 HR 135  BP lthis morning 126/81 HR 71    ?

## 2020-05-20 NOTE — Telephone Encounter (Signed)
Discussed with Dr. Garen Lah who recommends patient stop metoprolol and change flecainide to 50 mg two times a day in effort to keep her in sinus rhythm. Patient to call back if new or worsening symptoms.  Called patient she verbalized understanding of the recommendations. She has decided to leave work and go home to rest today. She will let us know if she has any new or worsening symptoms and she is aware she can call the on-call provider as well if needed. Med list updated.

## 2020-05-20 NOTE — Telephone Encounter (Signed)
Spoke to patient. Last night, she feels she went back into A.Fib for a little while.  BP was 147/108, HR 135-140 on BP Cuff. Symptoms were chest squeeze, jaw pain and throat burning (which she has had in the past with AFib.)  This morning, BP 126/81, HR 71. Symptoms are feels weak, shortness of breath with exertion, and heart squeezing discomfort when up and moving around the house. When moving around it occurs every 5 minutes for 1 minute at a time. When at rest, she has no symptoms other than tired. She does not feel she is in Afib this morning.  She is taking her medications as prescribed. No provider openings today. Dr. Garen Lah has opening on Monday at 1:20 pm. Advised patient I would discuss with Dr. Garen Lah and call her back with further recommendations.

## 2020-05-21 ENCOUNTER — Telehealth: Payer: Self-pay | Admitting: Medical

## 2020-05-21 NOTE — Telephone Encounter (Signed)
   Patient called the after hours line to report feeling poorly. She has had issues with with paroxysmal atrial fibrillation recently. Went for DCCV 05/11/20, however was in a normal heart rhythm so procedure cancelled. She called the office yesterday to report recurrent Afib with associated chest pain and DOE. She was recommended to stop her metoprolol and increase flecainide to 50mg  BID. She called again today and continues to feel poorly. Hrs have ranged from 120s-140s. SBP has maintained >110s today. Favor adding metoprolol tartrate 12.5mg  BID back to her regimen to try and improve her HR, though ultimately restoring sinus rhythm will probably improve her symptoms most of all. She is scheduled to see Dr. Garen Lah on 05/23/20 and Dr. Quentin Ore (EP) on 05/25/20. We discussed a low threshold to present to the ED should symptoms worsen or HR is sustained >140. She was in agreement with the plan and appreciative of the call.   Abigail Butts, PA-C 05/21/20; 3:54 PM

## 2020-05-23 ENCOUNTER — Encounter: Payer: Self-pay | Admitting: Cardiology

## 2020-05-23 ENCOUNTER — Other Ambulatory Visit: Payer: Self-pay

## 2020-05-23 ENCOUNTER — Ambulatory Visit (INDEPENDENT_AMBULATORY_CARE_PROVIDER_SITE_OTHER): Payer: BC Managed Care – PPO | Admitting: Cardiology

## 2020-05-23 VITALS — BP 110/76 | HR 71 | Ht 65.0 in | Wt 200.0 lb

## 2020-05-23 DIAGNOSIS — I10 Essential (primary) hypertension: Secondary | ICD-10-CM

## 2020-05-23 DIAGNOSIS — I480 Paroxysmal atrial fibrillation: Secondary | ICD-10-CM | POA: Diagnosis not present

## 2020-05-23 NOTE — Patient Instructions (Signed)
Medication Instructions:  Your physician recommends that you continue on your current medications as directed. Please refer to the Current Medication list given to you today.  *If you need a refill on your cardiac medications before your next appointment, please call your pharmacy*  Follow-Up: At [facility], you and your health needs are our priority.  As part of our continuing mission to provide you with exceptional heart care, we have created designated Provider Care Teams.  These Care Teams include your primary Cardiologist (physician) and Advanced Practice Providers (APPs -  Physician Assistants and Nurse Practitioners) who all work together to provide you with the care you need, when you need it.  We recommend signing up for the patient portal called "MyChart".  Sign up information is provided on this After Visit Summary.  MyChart is used to connect with patients for Virtual Visits (Telemedicine).  Patients are able to view lab/test results, encounter notes, upcoming appointments, etc.  Non-urgent messages can be sent to your provider as well.   To learn more about what you can do with MyChart, go to [email or web address].    Your next appointment:   1 month(s)  The format for your next appointment:   In Person  Provider:   You may see Kate Sable, MD or one of the following Advanced Practice Providers on your designated Care Team:    Murray Hodgkins, NP  [name], PA-C  Marrianne Mood, PA-C  Cadence Lilly, [place]

## 2020-05-23 NOTE — Progress Notes (Signed)
Cardiology Office Note:    Date:  05/23/2020   ID:  Victoria Williamson, DOB 07/13/1966, MRN [id number]  PCP:  Elby Beck, FNP  [facility] Cardiologist:  Kate Sable, MD  [place] Electrophysiologist:  None   Referring MD: Elby Beck, FNP   Chief Complaint  Patient presents with  . Follow-up    did not have DCCV d/t converting back to sinus  Pt states last thursday nght and saturday afternoon, pt was in AFIB.  Pt states she felt fine yesterday, feeling tired today. Pt has questions about side effects of Flecainide. Pt states she is scheduled for Nerve Conduction procedure on left hand on 12/1, wants to know if this may cause AFIB.     History of Present Illness:    Victoria Williamson is a 54 y.o. female with a hx of hypertension, paroxysmal atrial fibrillation, SVT who presents for follow-up.  Patient being seen due to paroxysmal atrial fibrillation.  She was scheduled for cardioversion on 05/11/2020 but was noted to be in sinus rhythm.  DC cardioversion was as such not performed. She was started on flecainide 50 twice daily and Lopressor.  States feeling weak, short of breath after starting medications.  Lopressor was reduced to 12.5 mg twice daily.  Flecainide initially decreased to 50 mg daily but patient had symptoms consistent with recurrence of A. fib.  Flecainide was increased to 50 mg twice daily.  She states feeling much better, currently denies palpitations.  EKG today shows sinus rhythm  Prior notes  Patient was seen in the emergency room 2 months ago due to palpitations and weakness.  EKG on 03/17/2020 while in the ED showed SVT, heart rate 147.  Blood pressures were low with systolic in the 81W.  Patient given IV fluids with spontaneous conversion of rhythm to sinus rhythm.  She was then discharged home with recommendations for outpatient monitoring.  Review of EMR showed patient had a PET myocardial perfusion stress test 01/2020 with no  evidence of ischemia.  Echocardiogram on 12/2019 showed normal systolic function, normal wall thickness, EF 29%, grade 2 diastolic dysfunction.  Had a left heart cath in 2014 and was told she had normal coronary arteries.  Patient states having symptoms of weakness/fatigue, shortness of breath whenever she goes into atrial fibrillation.  Initially diagnosed with atrial fibrillation in February 2021.  She states having the symptoms including shortness of breath and weakness, last evening.   Past Medical History:  Diagnosis Date  . Anxiety   . Arthritis   . Chest pain    normal coronary arteries by cardiac cath oct 29,2014  . Colon polyp   . Depression   . Dysautonomia ([place]) 12/18/2013  . Heart disease   . History of blood transfusion   . Hypertension   . Thyroid nodule     Past Surgical History:  Procedure Laterality Date  . btl    . CARDIOVERSION N/A 05/11/2020   Procedure: CARDIOVERSION;  Surgeon: Kate Sable, MD;  Location: [facility] ORS;  Service: Cardiovascular;  Laterality: N/A;  . ENDOMETRIAL ABLATION    . right carpal tunel      Current Medications: Current Meds  Medication Sig  . ALPRAZolam (XANAX) 0.25 MG tablet Take 0.25 mg by mouth daily as needed for anxiety or sleep.   Marland Kitchen ELIQUIS 5 MG TABS tablet Take 5 mg by mouth 2 (two) times daily.  . flecainide (TAMBOCOR) 50 MG tablet Take 1 tablet (50 mg total) by mouth 2 (two)  times daily.  . furosemide (LASIX) 20 MG tablet Take 20 mg by mouth daily.  . metoprolol tartrate (LOPRESSOR) 25 MG tablet Take 12.5 mg by mouth 2 (two) times daily.  . mirtazapine (REMERON) 7.5 MG tablet Take 7.5 mg by mouth at bedtime. Taking 1/2 tab daily  . sertraline (ZOLOFT) 50 MG tablet Take 50 mg by mouth daily.  . traMADol (ULTRAM) 50 MG tablet Take 50 mg by mouth daily as needed for moderate pain (Back pain).      Allergies:   Codeine and Paroxetine hcl   Social History   Socioeconomic History  . Marital status: Married    Spouse name:  Not on file  . Number of children: Not on file  . Years of education: Not on file  . Highest education level: Not on file  Occupational History  . Not on file  Tobacco Use  . Smoking status: Former Smoker    Types: Cigarettes    Quit date: 09/29/2019    Years since quitting: 0.6  . Smokeless tobacco: Never Used  Vaping Use  . Vaping Use: Never used  Substance and Sexual Activity  . Alcohol use: Never  . Drug use: Never  . Sexual activity: Yes    Partners: Male  Other Topics Concern  . Not on file  Social History Narrative   Lives at home with husband   Social Determinants of Health   Financial Resource Strain:   . Difficulty of Paying Living Expenses: Not on file  Food Insecurity:   . Worried About Charity fundraiser in the Last Year: Not on file  . Ran Out of Food in the Last Year: Not on file  Transportation Needs:   . Lack of Transportation (Medical): Not on file  . Lack of Transportation (Non-Medical): Not on file  Physical Activity:   . Days of Exercise per Week: Not on file  . Minutes of Exercise per Session: Not on file  Stress:   . Feeling of Stress : Not on file  Social Connections:   . Frequency of Communication with Friends and Family: Not on file  . Frequency of Social Gatherings with Friends and Family: Not on file  . Attends Religious Services: Not on file  . Active Member of Clubs or Organizations: Not on file  . Attends Archivist Meetings: Not on file  . Marital Status: Not on file     Family History: The patient's family history includes Alcohol abuse in her father; Arthritis in her mother; Breast cancer (age of onset: 72) in her maternal aunt; Breast cancer (age of onset: 33) in her maternal grandmother; COPD in her mother; Depression in her paternal grandmother; Diabetes in her mother and another family member; Hearing loss in her father; Heart attack in her brother, father, and paternal grandfather; Heart disease in her father, mother,  and paternal grandfather; High Cholesterol in her father and mother; Hypertension in her father and mother; Kidney disease in her mother.  ROS:   Please see the history of present illness.     All other systems reviewed and are negative.  EKGs/Labs/Other Studies Reviewed:    The following studies were reviewed today: Outside echo 12/2019 Summary   1. The left ventricle is normal in size with normal wall thickness.   2. The left ventricular systolic function is normal, LVEF is visually  estimated at > 55%.   3. There is grade II diastolic dysfunction (elevated filling pressure).   4. There is  mild aortic regurgitation.   5. The right ventricle is normal in size, with normal systolic function.   6. The aorta at the ascending aorta is mildly dilated.   PET MPI 01/2020 Impressions:  - Normal myocardial perfusion study  - No evidence for significant ischemia or scar is noted.  - During stress: Global systolic function is normal. The ejection fraction was greater than 65%.  - No significant coronary calcifications were noted on the attenuation CT.  - Incidentally noted on the attenuation CT scan is a dilated ascending aorta (4.3 cm in diameter) onnoncontrasted CT.     EKG:  EKG is  ordered today.  The ekg ordered today demonstrates normal sinus rhythm, normal ECG.  Recent Labs: 12/04/2019: B Natriuretic Peptide 56.0; TSH 6.513 03/17/2020: ALT 97 05/10/2020: BUN 7; Creatinine, Ser 0.71; Hemoglobin 13.8; Platelets 217; Potassium 4.0; Sodium 140  Recent Lipid Panel No results found for: CHOL, TRIG, HDL, CHOLHDL, VLDL, LDLCALC, LDLDIRECT  Physical Exam:    VS:  BP 110/76   Pulse 71   Ht 5\' 5"  (1.651 m)   Wt 200 lb (90.7 kg)   BMI 33.28 kg/m     Wt Readings from Last 3 Encounters:  05/23/20 200 lb (90.7 kg)  05/11/20 203 lb (92.1 kg)  05/10/20 203 lb (92.1 kg)     GEN:  Well nourished, well developed in no acute distress HEENT: Normal NECK: No JVD; No carotid  bruits LYMPHATICS: No lymphadenopathy CARDIAC: Regular rate and rhythm, no murmurs RESPIRATORY:  Clear to auscultation without rales, wheezing or rhonchi  ABDOMEN: Soft, non-tender, non-distended MUSCULOSKELETAL:  No edema; No deformity  SKIN: Warm and dry NEUROLOGIC:  Alert and oriented x 3 PSYCHIATRIC:  Normal affect   ASSESSMENT:    1. Paroxysmal A-fib ([place])   2. Primary hypertension    PLAN:    In order of problems listed above:  1. Patient with history of paroxysmal atrial fibrillation, currently in sinus rhythm.  CHA2DS2-VASc 02 continue Lopressor 12.5 mg twice daily, flecainide 50 mg twice daily.  Keep appointment with electrophysiology in 2 days for additional input.  Get echocardiogram. 2. History of hypertension, BP controlled.  Continue Lopressor, as above.  Follow-up in 4 weeks.  Total encounter time more than 35 minutes  Greater than 50% was spent in counseling and coordination of care with the patient    Medication Adjustments/Labs and Tests Ordered: Current medicines are reviewed at length with the patient today.  Concerns regarding medicines are outlined above.  Orders Placed This Encounter  Procedures  . EKG 12-Lead   No orders of the defined types were placed in this encounter.   Patient Instructions  Medication Instructions:  Your physician recommends that you continue on your current medications as directed. Please refer to the Current Medication list given to you today.  *If you need a refill on your cardiac medications before your next appointment, please call your pharmacy*  Follow-Up: At [facility], you and your health needs are our priority.  As part of our continuing mission to provide you with exceptional heart care, we have created designated Provider Care Teams.  These Care Teams include your primary Cardiologist (physician) and Advanced Practice Providers (APPs -  Physician Assistants and Nurse Practitioners) who all work together to  provide you with the care you need, when you need it.  We recommend signing up for the patient portal called "MyChart".  Sign up information is provided on this After Visit Summary.  MyChart is used to  connect with patients for Virtual Visits (Telemedicine).  Patients are able to view lab/test results, encounter notes, upcoming appointments, etc.  Non-urgent messages can be sent to your provider as well.   To learn more about what you can do with MyChart, go to [email or web address].    Your next appointment:   1 month(s)  The format for your next appointment:   In Person  Provider:   You may see Kate Sable, MD or one of the following Advanced Practice Providers on your designated Care Team:    Murray Hodgkins, NP  [name], PA-C  Marrianne Mood, PA-C  Cadence [place], [place]      Signed, Kate Sable, MD  05/23/2020 5:32 PM    [place]

## 2020-05-24 ENCOUNTER — Ambulatory Visit (INDEPENDENT_AMBULATORY_CARE_PROVIDER_SITE_OTHER): Payer: BC Managed Care – PPO

## 2020-05-24 ENCOUNTER — Encounter: Payer: Self-pay | Admitting: *Deleted

## 2020-05-24 DIAGNOSIS — I480 Paroxysmal atrial fibrillation: Secondary | ICD-10-CM

## 2020-05-25 ENCOUNTER — Other Ambulatory Visit: Payer: Self-pay

## 2020-05-25 ENCOUNTER — Encounter: Payer: Self-pay | Admitting: Cardiology

## 2020-05-25 ENCOUNTER — Ambulatory Visit (INDEPENDENT_AMBULATORY_CARE_PROVIDER_SITE_OTHER): Payer: BC Managed Care – PPO | Admitting: Cardiology

## 2020-05-25 VITALS — BP 100/60 | HR 62 | Ht 65.0 in | Wt 200.0 lb

## 2020-05-25 DIAGNOSIS — I480 Paroxysmal atrial fibrillation: Secondary | ICD-10-CM

## 2020-05-25 DIAGNOSIS — I10 Essential (primary) hypertension: Secondary | ICD-10-CM

## 2020-05-25 LAB — ECHOCARDIOGRAM COMPLETE
Area-P 1/2: 3.39 cm2
S' Lateral: 2.6 cm

## 2020-05-25 NOTE — Patient Instructions (Addendum)
Medication Instructions:  Your physician recommends that you continue on your current medications as directed. Please refer to the Current Medication list given to you today. *If you need a refill on your cardiac medications before your next appointment, please call your pharmacy*  Lab Work: None ordered. If you have labs (blood work) drawn today and your tests are completely normal, you will receive your results only by: Marland Kitchen MyChart Message (if you have MyChart) OR . A paper copy in the mail If you have any lab test that is abnormal or we need to change your treatment, we will call you to review the results.  Testing/Procedures: None ordered.  Follow-Up: At [facility], you and your health needs are our priority.  As part of our continuing mission to provide you with exceptional heart care, we have created designated Provider Care Teams.  These Care Teams include your primary Cardiologist (physician) and Advanced Practice Providers (APPs -  Physician Assistants and Nurse Practitioners) who all work together to provide you with the care you need, when you need it.  Your next appointment:    SEE INSTRUCTION LETTER    Cardiac Ablation Cardiac ablation is a procedure to disable (ablate) a small amount of heart tissue in very specific places. The heart has many electrical connections. Sometimes these connections are abnormal and can cause the heart to beat very fast or irregularly. Ablating some of the problem areas can improve the heart rhythm or return it to normal. Ablation may be done for people who:  Have Wolff-Parkinson-White syndrome.  Have fast heart rhythms (tachycardia).  Have taken medicines for an abnormal heart rhythm (arrhythmia) that were not effective or caused side effects.  Have a high-risk heartbeat that may be life-threatening. During the procedure, a small incision is made in the neck or the groin, and a long, thin, flexible tube (catheter) is inserted into the  incision and moved to the heart. Small devices (electrodes) on the tip of the catheter will send out electrical currents. A type of X-ray (fluoroscopy) will be used to help guide the catheter and to provide images of the heart. Tell a health care provider about:  Any allergies you have.  All medicines you are taking, including vitamins, herbs, eye drops, creams, and over-the-counter medicines.  Any problems you or family members have had with anesthetic medicines.  Any blood disorders you have.  Any surgeries you have had.  Any medical conditions you have, such as kidney failure.  Whether you are pregnant or may be pregnant. What are the risks? Generally, this is a safe procedure. However, problems may occur, including:  Infection.  Bruising and bleeding at the catheter insertion site.  Bleeding into the chest, especially into the sac that surrounds the heart. This is a serious complication.  Stroke or blood clots.  Damage to other structures or organs.  Allergic reaction to medicines or dyes.  Need for a permanent pacemaker if the normal electrical system is damaged. A pacemaker is a small computer that sends electrical signals to the heart and helps your heart beat normally.  The procedure not being fully effective. This may not be recognized until months later. Repeat ablation procedures are sometimes required. What happens before the procedure?  Follow instructions from your health care provider about eating or drinking restrictions.  Ask your health care provider about: ? Changing or stopping your regular medicines. This is especially important if you are taking diabetes medicines or blood thinners. ? Taking medicines such as aspirin  and ibuprofen. These medicines can thin your blood. Do not take these medicines before your procedure if your health care provider instructs you not to.  Plan to have someone take you home from the hospital or clinic.  If you will be  going home right after the procedure, plan to have someone with you for 24 hours. What happens during the procedure?  To lower your risk of infection: ? Your health care team will wash or sanitize their hands. ? Your skin will be washed with soap. ? Hair may be removed from the incision area.  An IV tube will be inserted into one of your veins.  You will be given a medicine to help you relax (sedative).  The skin on your neck or groin will be numbed.  An incision will be made in your neck or your groin.  A needle will be inserted through the incision and into a large vein in your neck or groin.  A catheter will be inserted into the needle and moved to your heart.  Dye may be injected through the catheter to help your surgeon see the area of the heart that needs treatment.  Electrical currents will be sent from the catheter to ablate heart tissue in desired areas. There are three types of energy that may be used to ablate heart tissue: ? Heat (radiofrequency energy). ? Laser energy. ? Extreme cold (cryoablation).  When the necessary tissue has been ablated, the catheter will be removed.  Pressure will be held on the catheter insertion area to prevent excessive bleeding.  A bandage (dressing) will be placed over the catheter insertion area. The procedure may vary among health care providers and hospitals. What happens after the procedure?  Your blood pressure, heart rate, breathing rate, and blood oxygen level will be monitored until the medicines you were given have worn off.  Your catheter insertion area will be monitored for bleeding. You will need to lie still for a few hours to ensure that you do not bleed from the catheter insertion area.  Do not drive for 24 hours or as long as directed by your health care provider. Summary  Cardiac ablation is a procedure to disable (ablate) a small amount of heart tissue in very specific places. Ablating some of the problem areas can  improve the heart rhythm or return it to normal.  During the procedure, electrical currents will be sent from the catheter to ablate heart tissue in desired areas. This information is not intended to replace advice given to you by your health care provider. Make sure you discuss any questions you have with your health care provider. Document Revised: 01/13/2018 Document Reviewed: 06/11/2016 Elsevier Patient Education  [place].

## 2020-05-25 NOTE — Progress Notes (Signed)
Electrophysiology Office Note:    Date:  05/25/2020   ID:  Victoria Williamson, DOB 02-12-1966, MRN [id number]  PCP:  Elby Beck, FNP  [facility] Cardiologist:  Kate Sable, MD  [facility] Electrophysiologist:  None   Referring MD: Kate Sable, MD   Chief Complaint: Paroxysmal atrial fibrillation  History of Present Illness:    Victoria Williamson is a 54 y.o. female who presents for an evaluation of symptomatic paroxysmal atrial fibrillation at the request of Dr. Garen Lah. Their medical history includes hypertension, depression.  Despite being on flecainide and metoprolol, she continues to have intermittent episodes of atrial fibrillation.  She is tolerating her Eliquis without any bleeding issues.  When she is in atrial fibrillation she feels extremely fatigued.  No syncope or presyncope.  She is interested in and avoiding long-term therapy for flecainide.  Past Medical History:  Diagnosis Date  . Anxiety   . Arthritis   . Chest pain    normal coronary arteries by cardiac cath oct 29,2014  . Colon polyp   . Depression   . Dysautonomia ([place]) 12/18/2013  . Heart disease   . History of blood transfusion   . Hypertension   . Thyroid nodule     Past Surgical History:  Procedure Laterality Date  . btl    . CARDIOVERSION N/A 05/11/2020   Procedure: CARDIOVERSION;  Surgeon: Kate Sable, MD;  Location: [facility] ORS;  Service: Cardiovascular;  Laterality: N/A;  . ENDOMETRIAL ABLATION    . right carpal tunel      Current Medications: Current Meds  Medication Sig  . ALPRAZolam (XANAX) 0.25 MG tablet Take 0.25 mg by mouth daily as needed for anxiety or sleep.   Marland Kitchen ELIQUIS 5 MG TABS tablet Take 5 mg by mouth 2 (two) times daily.  . flecainide (TAMBOCOR) 50 MG tablet Take 1 tablet (50 mg total) by mouth 2 (two) times daily.  . furosemide (LASIX) 20 MG tablet Take 20 mg by mouth daily.  . metoprolol tartrate (LOPRESSOR) 25 MG tablet Take 12.5 mg by  mouth 2 (two) times daily.  . mirtazapine (REMERON) 7.5 MG tablet Take 7.5 mg by mouth at bedtime. Taking 1/2 tab daily  . sertraline (ZOLOFT) 50 MG tablet Take 50 mg by mouth daily.  . traMADol (ULTRAM) 50 MG tablet Take 50 mg by mouth daily as needed for moderate pain (Back pain).      Allergies:   Codeine and Paroxetine hcl   Social History   Socioeconomic History  . Marital status: Married    Spouse name: Not on file  . Number of children: Not on file  . Years of education: Not on file  . Highest education level: Not on file  Occupational History  . Not on file  Tobacco Use  . Smoking status: Former Smoker    Types: Cigarettes    Quit date: 09/29/2019    Years since quitting: 0.6  . Smokeless tobacco: Never Used  Vaping Use  . Vaping Use: Never used  Substance and Sexual Activity  . Alcohol use: Never  . Drug use: Never  . Sexual activity: Yes    Partners: Male  Other Topics Concern  . Not on file  Social History Narrative   Lives at home with husband   Social Determinants of Health   Financial Resource Strain:   . Difficulty of Paying Living Expenses: Not on file  Food Insecurity:   . Worried About Charity fundraiser in the Last Year:  Not on file  . Ran Out of Food in the Last Year: Not on file  Transportation Needs:   . Lack of Transportation (Medical): Not on file  . Lack of Transportation (Non-Medical): Not on file  Physical Activity:   . Days of Exercise per Week: Not on file  . Minutes of Exercise per Session: Not on file  Stress:   . Feeling of Stress : Not on file  Social Connections:   . Frequency of Communication with Friends and Family: Not on file  . Frequency of Social Gatherings with Friends and Family: Not on file  . Attends Religious Services: Not on file  . Active Member of Clubs or Organizations: Not on file  . Attends Archivist Meetings: Not on file  . Marital Status: Not on file     Family History: The patient's family  history includes Alcohol abuse in her father; Arthritis in her mother; Breast cancer (age of onset: 71) in her maternal aunt; Breast cancer (age of onset: 58) in her maternal grandmother; COPD in her mother; Depression in her paternal grandmother; Diabetes in her mother and another family member; Hearing loss in her father; Heart attack in her brother, father, and paternal grandfather; Heart disease in her father, mother, and paternal grandfather; High Cholesterol in her father and mother; Hypertension in her father and mother; Kidney disease in her mother.  ROS:   Please see the history of present illness.    All other systems reviewed and are negative.  EKGs/Labs/Other Studies Reviewed:    The following studies were reviewed today: Prior records, echocardiogram  EKG:  The ekg ordered today demonstrates normal rhythm, QTc 450, QRS duration 92  March 17, 2020 EKG demonstrates typical atrial flutter with ventricular rate of 147 bpm  May 10, 2020 EKG demonstrates atrial fibrillation with rapid ventricular sponsor 174 bpm  May 24, 2020 echocardiogram personally reviewed Normal left ventricular function, 65% Right ventricular function normal No significant valvular abnormalities  Recent Labs: 12/04/2019: B Natriuretic Peptide 56.0; TSH 6.513 03/17/2020: ALT 97 05/10/2020: BUN 7; Creatinine, Ser 0.71; Hemoglobin 13.8; Platelets 217; Potassium 4.0; Sodium 140  Recent Lipid Panel No results found for: CHOL, TRIG, HDL, CHOLHDL, VLDL, LDLCALC, LDLDIRECT  Physical Exam:    VS:  BP 100/60   Pulse 62   Ht 5\' 5"  (1.651 m)   Wt 200 lb (90.7 kg)   SpO2 98%   BMI 33.28 kg/m     Wt Readings from Last 3 Encounters:  05/25/20 200 lb (90.7 kg)  05/23/20 200 lb (90.7 kg)  05/11/20 203 lb (92.1 kg)     GEN:  Well nourished, well developed in no acute distress HEENT: Normal NECK: No JVD; No carotid bruits LYMPHATICS: No lymphadenopathy CARDIAC: RRR, no murmurs, rubs,  gallops RESPIRATORY:  Clear to auscultation without rales, wheezing or rhonchi  ABDOMEN: Soft, non-tender, non-distended MUSCULOSKELETAL:  No edema; No deformity  SKIN: Warm and dry NEUROLOGIC:  Alert and oriented x 3 PSYCHIATRIC:  Normal affect   ASSESSMENT:    1. Paroxysmal A-fib (HCC)    PLAN:    In order of problems listed above:  1. Symptomatic paroxysmal atrial fibrillation and flutter Patient continues to have symptomatic paroxysms of atrial fibrillation and flutter despite treatment with flecainide and metoprolol.  She is tolerating Eliquis well.  I discussed the options including antiarrhythmic therapy versus ablation and she is interested in ablation management.  We will plan for a PVI plus typical flutter ablation.  Risk, benefits,  and alternatives to EP study and radiofrequency ablation for afib and flutter were also discussed in detail today. These risks include but are not limited to stroke, bleeding, vascular damage, tamponade, perforation, damage to the esophagus, lungs, and other structures, pulmonary vein stenosis, worsening renal function, and death. The patient understands these risk and wishes to proceed.  We will therefore proceed with catheter ablation at the next available time.  Carto, ICE, anesthesia are requested for the procedure.  Will also obtain CT PV protocol prior to the procedure to exclude LAA thrombus and further evaluate atrial anatomy.  2.  Hypertension Controlled.  Continue metoprolol     Medication Adjustments/Labs and Tests Ordered: Current medicines are reviewed at length with the patient today.  Concerns regarding medicines are outlined above.  Orders Placed This Encounter  Procedures  . EKG 12-Lead   No orders of the defined types were placed in this encounter.    Signed, Lars Mage, MD, [facility]  05/25/2020 12:06 PM    Electrophysiology [facility]   Anticoagulation instructions: Pt to hold Eliquis for 1  dose(s) prior to procedure and we will plan to resume the day of the procedure unless otherwise instructed after surgery.  Medication instructions morning of: The patient should hold ALL medications the morning of the procedure   Discharge: Our plan will be to discharge the patient same day after a period of observation

## 2020-05-26 ENCOUNTER — Other Ambulatory Visit: Payer: BC Managed Care – PPO

## 2020-05-31 NOTE — Progress Notes (Signed)
Spoke with patient and reviewed pharmacy recommendations to check with her primary care provider and also her psychiatrist. She verbalized understanding with no further questions at this time.

## 2020-06-07 ENCOUNTER — Telehealth: Payer: Self-pay | Admitting: Cardiology

## 2020-06-07 NOTE — Telephone Encounter (Signed)
Patient calling to check status of forms .  Please call.

## 2020-06-07 NOTE — Telephone Encounter (Signed)
Spoke with patient and informed her I sent her inquiry to Dr. Garen Lah and would follow up with her when I heard back from him.

## 2020-06-07 NOTE — Telephone Encounter (Signed)
Victoria Williamson calling in to ask if Dr.Agbor would think it is plausible for Victoria Williamson to have a nerve conduction in her hand (anytime in November)  before her cardiac ablation on 12/10 as well as continue the scheduled ablation  Please advise

## 2020-06-07 NOTE — Telephone Encounter (Signed)
Spoke with patient and notified her that we are just waiting on a physician signature for the completed paperwork. I am off tomorrow, [name] will try and get one from Dr. Garen Lah if he comes into the office and call the patient to pick up. Patient is aware if we cant get signature tomorrow that we will have it for her by Thursday morning when I am in clinic with Dr. Garen Lah. She understands that we will call her as soon as we have it.  Patient was grateful for the follow-up and agreed with plan.

## 2020-06-08 ENCOUNTER — Telehealth: Payer: Self-pay | Admitting: *Deleted

## 2020-06-08 NOTE — Telephone Encounter (Signed)
Left message for Pt.  Pt is wanting to have carpal tunnel surgery.  Advised per Dr. Quentin Ore- she cannot miss any doses of her Eliquis for 3 weeks prior to the ablation and at least 4 weeks post ablation.  Advised to call back if she had any further questions.

## 2020-06-08 NOTE — Telephone Encounter (Signed)
° °  [facility] Pre-operative Risk Assessment    HEARTCARE STAFF: - Please ensure there is not already an duplicate clearance open for this procedure. - Under Visit Info/Reason for Call, type in Other and utilize the format Clearance MM/DD/YY or Clearance TBD. Do not use dashes or single digits. - If request is for dental extraction, please clarify the # of teeth to be extracted.  Request for surgical clearance:  1. What type of surgery is being performed? LEFT CARPAL TUNNEL RELEASE    2. When is this surgery scheduled? 07/26/20   3. What type of clearance is required (medical clearance vs. Pharmacy clearance to hold med vs. Both)? BOTH  4. Are there any medications that need to be held prior to surgery and how long? ELIQUIS   5. Practice name and name of physician performing surgery? [facility]; DR. [place]   6. What is the office phone number? [phone]   7.   What is the office fax number? [place].   Anesthesia type (None, local, MAC, general) ? IV REGIONAL FOREARM BLOCK   Victoria Williamson 06/08/2020, 4:37 PM  _________________________________________________________________   (provider comments below)

## 2020-06-09 ENCOUNTER — Telehealth: Payer: Self-pay | Admitting: Cardiology

## 2020-06-09 ENCOUNTER — Other Ambulatory Visit: Payer: Self-pay | Admitting: Orthopedic Surgery

## 2020-06-09 NOTE — Telephone Encounter (Signed)
Called patient and informed her that the paperwork has been completed and signed and is being mailed to her employer today.  Patient was grateful for the follow up.

## 2020-06-09 NOTE — Telephone Encounter (Signed)
Patient is following up on her clearance for her hand surgery

## 2020-06-09 NOTE — Telephone Encounter (Signed)
Clinical pharmacist to review Eliquis 

## 2020-06-09 NOTE — Telephone Encounter (Signed)
Patient with diagnosis of afib on Eliquis for anticoagulation.    Procedure: left carpal tunnel release Date of procedure: initial request states 12/21, this is now scheduled for 11/18  CHA2DS2-VASc Score = 3  This indicates a 3.2% annual risk of stroke. The patient's score is based upon: CHF History: 1 HTN History: 1 Diabetes History: 0 Stroke History: 0 Vascular Disease History: 0 Age Score: 0 Gender Score: 1   Pt underwent DCCV on 10/6 and is scheduled for ablation on 12/10.   CrCl >183mL/min Platelet count 227K  Per office protocol, patient can hold Eliquis for 1 day prior to procedure. She should ideally resume Eliquis 11/18 in the PM after procedure due to ablation scheduled 3 weeks later.

## 2020-06-09 NOTE — Telephone Encounter (Signed)
Dr. Quentin Ore to review. Currently carpal tunnel surgery is scheduled for 11/18 and afib ablation is scheduled for 12/10, there is exactly 3 weeks in between the 2 procedures, therefore patient will have to resume Eliquis as late as the morning after the carpal tunnel procedure to allow 3 weeks of anticoagulation prior to ablation. I want to make sure this is ok from EP perspective. I think the carpal tunnel procedure was originally scheduled in December, but moved to 11/18th because patient will need uninterrupted anticoagulation for 4 weeks after the surgery.   Otherwise, patient is doing without without chest pain or shortness of breath. With recent reassuring myoview and echo, plan to clear the patient if EP is ok with the timing of surgery.

## 2020-06-09 NOTE — Telephone Encounter (Signed)
PA from [place] calling in to check with changing her depression medication from zoloft to trintellix OR viivryd   Please advise when able

## 2020-06-10 NOTE — Telephone Encounter (Signed)
Not sure about what is the actual question.   No significant drug interaction noted.  Less documented QTc prolongation with Trintellix or Viibryd when compared with Zoloft.  Let mw know if you need any additional information.

## 2020-06-10 NOTE — Telephone Encounter (Signed)
Spoke with Donald Siva and gave her the information provided by pharmacy.  She was grateful for the follow up.

## 2020-06-10 NOTE — Telephone Encounter (Signed)
Called and LMOM for [place], [place].

## 2020-06-10 NOTE — Telephone Encounter (Signed)
Per Dr. Quentin Ore--  Patient will require 3 weeks of anticoagulation prior to the ablation procedure. She will require uninterrupted anticoagulation for at least 6-8 weeks following the ablation.   Will need the hand surgeon to comment on ability to restart anticoagulation day of surgery or if they should reschedule the operation.      Sending to preop pool

## 2020-06-13 NOTE — Telephone Encounter (Signed)
   Primary Cardiologist: Kate Sable, MD  Chart reviewed as part of pre-operative protocol coverage. Given past medical history and time since last visit, based on ACC/AHA guidelines, Victoria Williamson would be at acceptable risk for the planned procedure without further cardiovascular testing.   Per pharmacy's recommendation, patient can hold eliquis 1 day prior to her upcoming surgery. Per Dr. Quentin Ore, patient would need to restart eliquis the evening of 06/23/20 in order to prevent delay of her upcoming ablation.   I will route this recommendation to the requesting party via Epic fax function and remove from pre-op pool.  Please call with questions.  Callback: Please confirm with surgeons office this will be possible. If not, carpal tunnel surgery may need to be delayed.  Abigail Butts, PA-C 06/13/2020, 9:19 AM

## 2020-06-13 NOTE — Telephone Encounter (Signed)
Leave message on voicemail at Dr Fredna Dow office advising that pt can hold Eliquis 1 day prior to procedure.

## 2020-06-15 NOTE — Telephone Encounter (Signed)
Patient calling to check on form and possible need for correction.  Page 2 and question # 9.  Patient believes this is supposed to state 1-2 times per week for up to 72 hrsper episode.  Please call .

## 2020-06-16 ENCOUNTER — Encounter (HOSPITAL_BASED_OUTPATIENT_CLINIC_OR_DEPARTMENT_OTHER): Payer: Self-pay | Admitting: Orthopedic Surgery

## 2020-06-16 ENCOUNTER — Other Ambulatory Visit: Payer: Self-pay

## 2020-06-16 NOTE — Telephone Encounter (Signed)
Patient returning call from nurse.    She is aware to check detailed vm frim nurse and call back with any questions or concerns .

## 2020-06-17 ENCOUNTER — Telehealth: Payer: Self-pay | Admitting: *Deleted

## 2020-06-17 ENCOUNTER — Telehealth: Payer: Self-pay | Admitting: Cardiology

## 2020-06-17 DIAGNOSIS — Z0279 Encounter for issue of other medical certificate: Secondary | ICD-10-CM

## 2020-06-17 NOTE — Telephone Encounter (Signed)
Per chart, she has pre procedure labs scheduled through cardiology and has been sent a mychart message from them.

## 2020-06-17 NOTE — Telephone Encounter (Signed)
Patient dropped off FMLA forms to be completed and faxed  Received $29 in cash payment and placed in nurse box

## 2020-06-17 NOTE — Telephone Encounter (Signed)
Patient called stating that she has a lab appointment scheduled at [facility] lab Monday 06/20/20 and wants to know if Tor Netters NP ordered the lab work? Advised patient that I do not see where our office has ordered lab work and if [name] did it would be scheduled at our office. Patient stated that she has a hand procedure coming up and is wondering if the hand surgeon order it. Advised patient that she should reach out to the hand surgeon or [facility] lab to fine out who ordered it. Patient appreciated the information.

## 2020-06-20 ENCOUNTER — Other Ambulatory Visit (HOSPITAL_COMMUNITY)
Admission: RE | Admit: 2020-06-20 | Discharge: 2020-06-20 | Disposition: A | Discharge status: Home / Self Care | Payer: BC Managed Care – PPO | Source: Ambulatory Visit | Attending: Orthopedic Surgery | Admitting: Orthopedic Surgery

## 2020-06-20 ENCOUNTER — Encounter (HOSPITAL_BASED_OUTPATIENT_CLINIC_OR_DEPARTMENT_OTHER)
Admission: RE | Admit: 2020-06-20 | Discharge: 2020-06-20 | Disposition: A | Discharge status: Home / Self Care | Payer: BC Managed Care – PPO | Source: Ambulatory Visit | Attending: Orthopedic Surgery | Admitting: Orthopedic Surgery

## 2020-06-20 ENCOUNTER — Encounter: Payer: Self-pay | Admitting: Cardiology

## 2020-06-20 ENCOUNTER — Ambulatory Visit (INDEPENDENT_AMBULATORY_CARE_PROVIDER_SITE_OTHER): Payer: BC Managed Care – PPO | Admitting: Cardiology

## 2020-06-20 ENCOUNTER — Other Ambulatory Visit: Payer: Self-pay

## 2020-06-20 VITALS — BP 134/70 | HR 65 | Ht 65.0 in | Wt 199.1 lb

## 2020-06-20 DIAGNOSIS — G5602 Carpal tunnel syndrome, left upper limb: Secondary | ICD-10-CM | POA: Diagnosis present

## 2020-06-20 DIAGNOSIS — Z888 Allergy status to other drugs, medicaments and biological substances status: Secondary | ICD-10-CM | POA: Diagnosis not present

## 2020-06-20 DIAGNOSIS — Z20822 Contact with and (suspected) exposure to covid-19: Secondary | ICD-10-CM | POA: Insufficient documentation

## 2020-06-20 DIAGNOSIS — Z885 Allergy status to narcotic agent status: Secondary | ICD-10-CM | POA: Diagnosis not present

## 2020-06-20 DIAGNOSIS — I480 Paroxysmal atrial fibrillation: Secondary | ICD-10-CM | POA: Diagnosis not present

## 2020-06-20 DIAGNOSIS — Z01812 Encounter for preprocedural laboratory examination: Secondary | ICD-10-CM | POA: Insufficient documentation

## 2020-06-20 DIAGNOSIS — I10 Essential (primary) hypertension: Secondary | ICD-10-CM

## 2020-06-20 DIAGNOSIS — Z87891 Personal history of nicotine dependence: Secondary | ICD-10-CM | POA: Diagnosis not present

## 2020-06-20 LAB — BASIC METABOLIC PANEL
Anion gap: 8 (ref 5–15)
BUN: 10 mg/dL (ref 6–20)
CO2: 26 mmol/L (ref 22–32)
Calcium: 9.1 mg/dL (ref 8.9–10.3)
Chloride: 104 mmol/L (ref 98–111)
Creatinine, Ser: 0.77 mg/dL (ref 0.44–1.00)
GFR, Estimated: >60 mL/min (ref >60)
Glucose, Bld: 82 mg/dL (ref 70–99)
Potassium: 4.4 mmol/L (ref 3.5–5.1)
Sodium: 138 mmol/L (ref 135–145)

## 2020-06-20 LAB — SARS CORONAVIRUS 2 (TAT 6-24 HRS): SARS Coronavirus 2: NEGATIVE

## 2020-06-20 MED ORDER — FUROSEMIDE 20 MG PO TABS
20.0000 mg | ORAL_TABLET | Freq: Every day | ORAL | Status: DC | PRN
Start: 2020-06-20 — End: 2021-07-18

## 2020-06-20 NOTE — Telephone Encounter (Signed)
Payment has been posted and receipt mailed to patient- TB

## 2020-06-20 NOTE — Patient Instructions (Addendum)
Medication Instructions:   Your physician has recommended you make the following change in your medication:   Change the way you take your Lasix to ONLY AS NEEDED.   *If you need a refill on your cardiac medications before your next appointment, please call your pharmacy*   Lab Work: None Ordered If you have labs (blood work) drawn today and your tests are completely normal, you will receive your results only by: Marland Kitchen MyChart Message (if you have MyChart) OR . A paper copy in the mail If you have any lab test that is abnormal or we need to change your treatment, we will call you to review the results.   Testing/Procedures: None Ordered   Follow-Up: At [facility], you and your health needs are our priority.  As part of our continuing mission to provide you with exceptional heart care, we have created designated Provider Care Teams.  These Care Teams include your primary Cardiologist (physician) and Advanced Practice Providers (APPs -  Physician Assistants and Nurse Practitioners) who all work together to provide you with the care you need, when you need it.  We recommend signing up for the patient portal called "MyChart".  Sign up information is provided on this After Visit Summary.  MyChart is used to connect with patients for Virtual Visits (Telemedicine).  Patients are able to view lab/test results, encounter notes, upcoming appointments, etc.  Non-urgent messages can be sent to your provider as well.   To learn more about what you can do with MyChart, go to [email or web address].    Your next appointment:   3 month(s)  The format for your next appointment:   In Person  Provider:   Kate Sable, MD   Other Instructions

## 2020-06-20 NOTE — Progress Notes (Signed)

## 2020-06-20 NOTE — Progress Notes (Signed)
Cardiology Office Note:    Date:  06/20/2020   ID:  Victoria Williamson, DOB 06/13/1966, MRN [id number]  PCP:  Elby Beck, FNP  [facility] Cardiologist:  Kate Sable, MD  [facility] Electrophysiologist:  None   Referring MD: Elby Beck, FNP   Chief Complaint  Patient presents with  . OTHER    Afib no complaints today. Meds reviewed verbally with pt.     History of Present Illness:    Victoria Williamson is a 54 y.o. female with a hx of hypertension, paroxysmal atrial fibrillation, SVT who presents for follow-up.  Recently seen for symptomatic paroxysmal atrial fibrillation.  She has symptoms of shortness of breath, weakness whenever she goes into atrial fibrillation.  Was referred to electrophysiology, seen by EP, PVI/ablation is being planned.  Tolerating current doses of flecainide 50 mg twice daily and Lopressor 12.5 mg twice daily.  Feels okay, has rare episodes of palpitations.   Prior notes  Patient was seen in the emergency room 2 months ago due to palpitations and weakness.  EKG on 03/17/2020 while in the ED showed SVT, heart rate 147.  Blood pressures were low with systolic in the 59R.  Patient given IV fluids with spontaneous conversion of rhythm to sinus rhythm.  She was then discharged home with recommendations for outpatient monitoring.  Review of EMR showed patient had a PET myocardial perfusion stress test 01/2020 with no evidence of ischemia.  Echocardiogram on 12/2019 showed normal systolic function, normal wall thickness, EF 41%, grade 2 diastolic dysfunction.  Had a left heart cath in 2014 and was told she had normal coronary arteries.  Patient states having symptoms of weakness/fatigue, shortness of breath whenever she goes into atrial fibrillation.  Initially diagnosed with atrial fibrillation in February 2021.  She states having the symptoms including shortness of breath and weakness, last evening.   Past Medical History:  Diagnosis  Date  . Anxiety   . Arthritis   . Chest pain    normal coronary arteries by cardiac cath oct 29,2014  . Colon polyp   . Depression   . Dysautonomia ([place]) 12/18/2013  . Fatty liver   . Heart disease   . History of blood transfusion   . Hypertension   . PAF (paroxysmal atrial fibrillation) ([place])   . Thyroid nodule     Past Surgical History:  Procedure Laterality Date  . btl    . CARDIOVERSION N/A 05/11/2020   Procedure: CARDIOVERSION;  Surgeon: Kate Sable, MD;  Location: [facility] ORS;  Service: Cardiovascular;  Laterality: N/A;  . ENDOMETRIAL ABLATION    . right carpal tunel      Current Medications: Current Meds  Medication Sig  . ALPRAZolam (XANAX) 0.25 MG tablet Take 0.25 mg by mouth daily as needed for anxiety or sleep.   Marland Kitchen ELIQUIS 5 MG TABS tablet Take 5 mg by mouth 2 (two) times daily.  . flecainide (TAMBOCOR) 50 MG tablet Take 1 tablet (50 mg total) by mouth 2 (two) times daily.  . furosemide (LASIX) 20 MG tablet Take 1 tablet (20 mg total) by mouth daily as needed.  . metoprolol tartrate (LOPRESSOR) 25 MG tablet Take 12.5 mg by mouth 2 (two) times daily.  Marland Kitchen vortioxetine HBr (TRINTELLIX) 10 MG TABS tablet Take 10 mg by mouth daily.  . [DISCONTINUED] furosemide (LASIX) 20 MG tablet Take 20 mg by mouth daily.     Allergies:   Codeine and Paroxetine hcl   Social History   Socioeconomic  History  . Marital status: Married    Spouse name: Not on file  . Number of children: Not on file  . Years of education: Not on file  . Highest education level: Not on file  Occupational History  . Not on file  Tobacco Use  . Smoking status: Former Smoker    Types: Cigarettes    Quit date: 12/29/2019    Years since quitting: 0.4  . Smokeless tobacco: Never Used  Vaping Use  . Vaping Use: Every day  Substance and Sexual Activity  . Alcohol use: Not Currently  . Drug use: Never  . Sexual activity: Yes    Partners: Male  Other Topics Concern  . Not on file  Social History  Narrative   Lives at home with husband   Social Determinants of Health   Financial Resource Strain:   . Difficulty of Paying Living Expenses: Not on file  Food Insecurity:   . Worried About Charity fundraiser in the Last Year: Not on file  . Ran Out of Food in the Last Year: Not on file  Transportation Needs:   . Lack of Transportation (Medical): Not on file  . Lack of Transportation (Non-Medical): Not on file  Physical Activity:   . Days of Exercise per Week: Not on file  . Minutes of Exercise per Session: Not on file  Stress:   . Feeling of Stress : Not on file  Social Connections:   . Frequency of Communication with Friends and Family: Not on file  . Frequency of Social Gatherings with Friends and Family: Not on file  . Attends Religious Services: Not on file  . Active Member of Clubs or Organizations: Not on file  . Attends Archivist Meetings: Not on file  . Marital Status: Not on file     Family History: The patient's family history includes Alcohol abuse in her father; Arthritis in her mother; Breast cancer (age of onset: 37) in her maternal aunt; Breast cancer (age of onset: 15) in her maternal grandmother; COPD in her mother; Depression in her paternal grandmother; Diabetes in her mother and another family member; Hearing loss in her father; Heart attack in her brother, father, and paternal grandfather; Heart disease in her father, mother, and paternal grandfather; High Cholesterol in her father and mother; Hypertension in her father and mother; Kidney disease in her mother.  ROS:   Please see the history of present illness.     All other systems reviewed and are negative.  EKGs/Labs/Other Studies Reviewed:    The following studies were reviewed today: Outside echo 12/2019 Summary   1. The left ventricle is normal in size with normal wall thickness.   2. The left ventricular systolic function is normal, LVEF is visually  estimated at > 55%.   3. There  is grade II diastolic dysfunction (elevated filling pressure).   4. There is mild aortic regurgitation.   5. The right ventricle is normal in size, with normal systolic function.   6. The aorta at the ascending aorta is mildly dilated.   PET MPI 01/2020 Impressions:  - Normal myocardial perfusion study  - No evidence for significant ischemia or scar is noted.  - During stress: Global systolic function is normal. The ejection fraction was greater than 65%.  - No significant coronary calcifications were noted on the attenuation CT.  - Incidentally noted on the attenuation CT scan is a dilated ascending aorta (4.3 cm in diameter) onnoncontrasted CT.  EKG:  EKG is  ordered today.  The ekg ordered today demonstrates normal sinus rhythm, normal ECG.  Recent Labs: 12/04/2019: B Natriuretic Peptide 56.0; TSH 6.513 03/17/2020: ALT 97 05/10/2020: Hemoglobin 13.8; Platelets 217 06/20/2020: BUN 10; Creatinine, Ser 0.77; Potassium 4.4; Sodium 138  Recent Lipid Panel No results found for: CHOL, TRIG, HDL, CHOLHDL, VLDL, LDLCALC, LDLDIRECT  Physical Exam:    VS:  BP 134/70 (BP Location: Left Arm, Patient Position: Sitting, Cuff Size: Normal)   Pulse 65   Ht 5\' 5"  (1.651 m)   Wt 199 lb 2 oz (90.3 kg)   SpO2 98%   BMI 33.14 kg/m     Wt Readings from Last 3 Encounters:  06/20/20 199 lb 2 oz (90.3 kg)  05/25/20 200 lb (90.7 kg)  05/23/20 200 lb (90.7 kg)     GEN:  Well nourished, well developed in no acute distress HEENT: Normal NECK: No JVD; No carotid bruits LYMPHATICS: No lymphadenopathy CARDIAC: Regular rate and rhythm, no murmurs RESPIRATORY:  Clear to auscultation without rales, wheezing or rhonchi  ABDOMEN: Soft, non-tender, non-distended MUSCULOSKELETAL:  No edema; No deformity  SKIN: Warm and dry NEUROLOGIC:  Alert and oriented x 3 PSYCHIATRIC:  Normal affect   ASSESSMENT:    1. Paroxysmal A-fib ([place])   2. Essential hypertension    PLAN:    In order of problems  listed above:  1. Patient with history of paroxysmal atrial fibrillation, currently in sinus rhythm.  CHA2DS2-VASc of 2. continue Lopressor 12.5 mg twice daily, flecainide 50 mg twice daily.  Appreciate input from EP.  Ablation as per electrophysiology. 2. History of hypertension, BP controlled.  Continue Lopressor, as above.  Follow-up in 3 months.  Total encounter time more than 35 minutes  Greater than 50% was spent in counseling and coordination of care with the patient    Medication Adjustments/Labs and Tests Ordered: Current medicines are reviewed at length with the patient today.  Concerns regarding medicines are outlined above.  Orders Placed This Encounter  Procedures  . EKG 12-Lead   Meds ordered this encounter  Medications  . furosemide (LASIX) 20 MG tablet    Sig: Take 1 tablet (20 mg total) by mouth daily as needed.    Dispense:  30 tablet    Patient Instructions  Medication Instructions:   Your physician has recommended you make the following change in your medication:   Change the way you take your Lasix to ONLY AS NEEDED.   *If you need a refill on your cardiac medications before your next appointment, please call your pharmacy*   Lab Work: None Ordered If you have labs (blood work) drawn today and your tests are completely normal, you will receive your results only by: Marland Kitchen MyChart Message (if you have MyChart) OR . A paper copy in the mail If you have any lab test that is abnormal or we need to change your treatment, we will call you to review the results.   Testing/Procedures: None Ordered   Follow-Up: At [facility], you and your health needs are our priority.  As part of our continuing mission to provide you with exceptional heart care, we have created designated Provider Care Teams.  These Care Teams include your primary Cardiologist (physician) and Advanced Practice Providers (APPs -  Physician Assistants and Nurse Practitioners) who all work  together to provide you with the care you need, when you need it.  We recommend signing up for the patient portal called "MyChart".  Sign up  information is provided on this After Visit Summary.  MyChart is used to connect with patients for Virtual Visits (Telemedicine).  Patients are able to view lab/test results, encounter notes, upcoming appointments, etc.  Non-urgent messages can be sent to your provider as well.   To learn more about what you can do with MyChart, go to [email or web address].    Your next appointment:   3 month(s)  The format for your next appointment:   In Person  Provider:   Kate Sable, MD   Other Instructions      Signed, Kate Sable, MD  06/20/2020 5:13 PM    [place]

## 2020-06-22 NOTE — Telephone Encounter (Signed)
Form was completed and patient picked up a 2 weeks ago.

## 2020-06-22 NOTE — Telephone Encounter (Signed)
FMLA paperwork completed.  Copy made and given to MR to scan in.  Mailed FMLA as requested to [employer] employer.

## 2020-06-22 NOTE — Telephone Encounter (Signed)
Dr. Quentin Ore completed Victoria Williamson paperwork was handed to The patient.

## 2020-06-23 ENCOUNTER — Encounter (HOSPITAL_BASED_OUTPATIENT_CLINIC_OR_DEPARTMENT_OTHER)
Admission: RE | Disposition: A | Discharge status: Home / Self Care | Payer: Self-pay | Source: Home / Self Care | Attending: Orthopedic Surgery

## 2020-06-23 ENCOUNTER — Encounter (HOSPITAL_BASED_OUTPATIENT_CLINIC_OR_DEPARTMENT_OTHER): Payer: Self-pay | Admitting: Orthopedic Surgery

## 2020-06-23 ENCOUNTER — Other Ambulatory Visit: Payer: Self-pay

## 2020-06-23 ENCOUNTER — Ambulatory Visit (HOSPITAL_BASED_OUTPATIENT_CLINIC_OR_DEPARTMENT_OTHER): Payer: BC Managed Care – PPO | Admitting: Anesthesiology

## 2020-06-23 ENCOUNTER — Ambulatory Visit (HOSPITAL_BASED_OUTPATIENT_CLINIC_OR_DEPARTMENT_OTHER)
Admission: RE | Admit: 2020-06-23 | Discharge: 2020-06-23 | Disposition: A | Discharge status: Home / Self Care | Payer: BC Managed Care – PPO | Attending: Orthopedic Surgery | Admitting: Orthopedic Surgery

## 2020-06-23 DIAGNOSIS — Z888 Allergy status to other drugs, medicaments and biological substances status: Secondary | ICD-10-CM | POA: Insufficient documentation

## 2020-06-23 DIAGNOSIS — G5602 Carpal tunnel syndrome, left upper limb: Secondary | ICD-10-CM | POA: Diagnosis not present

## 2020-06-23 DIAGNOSIS — Z885 Allergy status to narcotic agent status: Secondary | ICD-10-CM | POA: Insufficient documentation

## 2020-06-23 DIAGNOSIS — Z87891 Personal history of nicotine dependence: Secondary | ICD-10-CM | POA: Insufficient documentation

## 2020-06-23 DIAGNOSIS — Z20822 Contact with and (suspected) exposure to covid-19: Secondary | ICD-10-CM | POA: Insufficient documentation

## 2020-06-23 HISTORY — DX: Fatty (change of) liver, not elsewhere classified: K76.0

## 2020-06-23 HISTORY — DX: Paroxysmal atrial fibrillation: I48.0

## 2020-06-23 HISTORY — PX: CARPAL TUNNEL RELEASE: SHX101

## 2020-06-23 SURGERY — CARPAL TUNNEL RELEASE
Anesthesia: Monitor Anesthesia Care | Site: Hand | Laterality: Left

## 2020-06-23 MED ORDER — ONDANSETRON HCL 4 MG/2ML IJ SOLN: 4.0000 mg | Freq: Four times a day (QID) | INTRAMUSCULAR | Status: DC | PRN

## 2020-06-23 MED ORDER — CEFAZOLIN SODIUM-DEXTROSE 2-4 GM/100ML-% IV SOLN: 2.0000 g | INTRAVENOUS | Status: DC

## 2020-06-23 MED ORDER — MIDAZOLAM HCL 2 MG/2ML IJ SOLN
INTRAMUSCULAR | Status: AC
  Filled 2020-06-23: qty 2

## 2020-06-23 MED ORDER — LACTATED RINGERS IV SOLN: INTRAVENOUS | Status: DC

## 2020-06-23 MED ORDER — FENTANYL CITRATE (PF) 100 MCG/2ML IJ SOLN: 25.0000 ug | INTRAMUSCULAR | Status: DC | PRN

## 2020-06-23 MED ORDER — FENTANYL CITRATE (PF) 100 MCG/2ML IJ SOLN
INTRAMUSCULAR | Status: DC | PRN
  Administered 2020-06-23: 50 ug via INTRAVENOUS

## 2020-06-23 MED ORDER — OXYCODONE HCL 5 MG/5ML PO SOLN: 5.0000 mg | Freq: Once | ORAL | Status: DC | PRN

## 2020-06-23 MED ORDER — FENTANYL CITRATE (PF) 100 MCG/2ML IJ SOLN
INTRAMUSCULAR | Status: AC
  Filled 2020-06-23: qty 2

## 2020-06-23 MED ORDER — ONDANSETRON HCL 4 MG/2ML IJ SOLN
INTRAMUSCULAR | Status: AC
  Filled 2020-06-23: qty 2

## 2020-06-23 MED ORDER — ONDANSETRON HCL 4 MG/2ML IJ SOLN
INTRAMUSCULAR | Status: DC | PRN
  Administered 2020-06-23: 4 mg via INTRAVENOUS

## 2020-06-23 MED ORDER — PROPOFOL 500 MG/50ML IV EMUL
INTRAVENOUS | Status: DC | PRN
  Administered 2020-06-23: 75 ug/kg/min via INTRAVENOUS

## 2020-06-23 MED ORDER — TRAMADOL HCL 50 MG PO TABS
50.0000 mg | ORAL_TABLET | Freq: Four times a day (QID) | ORAL | 0 refills | Status: DC | PRN
Start: 2020-06-23 — End: 2020-08-12

## 2020-06-23 MED ORDER — CEFAZOLIN SODIUM-DEXTROSE 2-4 GM/100ML-% IV SOLN
INTRAVENOUS | Status: AC
  Filled 2020-06-23: qty 100

## 2020-06-23 MED ORDER — LIDOCAINE HCL (PF) 0.5 % IJ SOLN
INTRAMUSCULAR | Status: DC | PRN
  Administered 2020-06-23: 35 mL via INTRAVENOUS

## 2020-06-23 MED ORDER — BUPIVACAINE HCL (PF) 0.25 % IJ SOLN
INTRAMUSCULAR | Status: DC | PRN
  Administered 2020-06-23: 10 mL

## 2020-06-23 MED ORDER — 0.9 % SODIUM CHLORIDE (POUR BTL) OPTIME
TOPICAL | Status: DC | PRN
  Administered 2020-06-23: 1000 mL

## 2020-06-23 MED ORDER — OXYCODONE HCL 5 MG PO TABS: 5.0000 mg | ORAL_TABLET | Freq: Once | ORAL | Status: DC | PRN

## 2020-06-23 MED ORDER — MIDAZOLAM HCL 5 MG/5ML IJ SOLN
INTRAMUSCULAR | Status: DC | PRN
  Administered 2020-06-23: 2 mg via INTRAVENOUS

## 2020-06-23 SURGICAL SUPPLY — 35 items
APL PRP STRL LF DISP 70% ISPRP (MISCELLANEOUS) ×1
BLADE SURG 15 STRL LF DISP TIS (BLADE) ×1 IMPLANT
BLADE SURG 15 STRL SS (BLADE) ×2
BNDG CMPR 9X4 STRL LF SNTH (GAUZE/BANDAGES/DRESSINGS)
BNDG COHESIVE 3X5 TAN STRL LF (GAUZE/BANDAGES/DRESSINGS) ×2 IMPLANT
BNDG ESMARK 4X9 LF (GAUZE/BANDAGES/DRESSINGS) IMPLANT
BNDG GAUZE ELAST 4 BULKY (GAUZE/BANDAGES/DRESSINGS) ×2 IMPLANT
CHLORAPREP W/TINT 26 (MISCELLANEOUS) ×2 IMPLANT
CORD BIPOLAR FORCEPS 12FT (ELECTRODE) ×2 IMPLANT
COVER BACK TABLE 60X90IN (DRAPES) ×2 IMPLANT
COVER MAYO STAND STRL (DRAPES) ×2 IMPLANT
COVER WAND RF STERILE (DRAPES) IMPLANT
CUFF TOURN SGL QUICK 18X4 (TOURNIQUET CUFF) ×2 IMPLANT
DRAPE EXTREMITY T 121X128X90 (DISPOSABLE) ×2 IMPLANT
DRAPE SURG 17X23 STRL (DRAPES) ×2 IMPLANT
DRSG PAD ABDOMINAL 8X10 ST (GAUZE/BANDAGES/DRESSINGS) ×2 IMPLANT
GAUZE SPONGE 4X4 12PLY STRL (GAUZE/BANDAGES/DRESSINGS) ×2 IMPLANT
GAUZE XEROFORM 1X8 LF (GAUZE/BANDAGES/DRESSINGS) ×2 IMPLANT
GLOVE BIOGEL PI IND STRL 8.5 (GLOVE) ×1 IMPLANT
GLOVE BIOGEL PI INDICATOR 8.5 (GLOVE) ×1
GLOVE SURG ORTHO 8.0 STRL STRW (GLOVE) ×2 IMPLANT
GOWN STRL REUS W/ TWL LRG LVL3 (GOWN DISPOSABLE) ×1 IMPLANT
GOWN STRL REUS W/TWL LRG LVL3 (GOWN DISPOSABLE) ×2
GOWN STRL REUS W/TWL XL LVL3 (GOWN DISPOSABLE) ×2 IMPLANT
NDL PRECISIONGLIDE 27X1.5 (NEEDLE) IMPLANT
NEEDLE PRECISIONGLIDE 27X1.5 (NEEDLE) IMPLANT
NS IRRIG 1000ML POUR BTL (IV SOLUTION) ×2 IMPLANT
PACK BASIN DAY SURGERY FS (CUSTOM PROCEDURE TRAY) ×2 IMPLANT
STOCKINETTE 4X48 STRL (DRAPES) ×2 IMPLANT
SUT ETHILON 4 0 PS 2 18 (SUTURE) ×2 IMPLANT
SUT VICRYL 4-0 PS2 18IN ABS (SUTURE) IMPLANT
SYR BULB EAR ULCER 3OZ GRN STR (SYRINGE) ×2 IMPLANT
SYR CONTROL 10ML LL (SYRINGE) IMPLANT
TOWEL GREEN STERILE FF (TOWEL DISPOSABLE) ×2 IMPLANT
UNDERPAD 30X36 HEAVY ABSORB (UNDERPADS AND DIAPERS) ×2 IMPLANT

## 2020-06-23 NOTE — Transfer of Care (Signed)
Immediate Anesthesia Transfer of Care Note  Patient: Victoria Williamson  Procedure(s) Performed: CARPAL TUNNEL RELEASE (Left Hand)  Patient Location: PACU  Anesthesia Type:MAC and Bier block  Level of Consciousness: awake, alert  and oriented  Airway & Oxygen Therapy: Patient Spontanous Breathing and Patient connected to face mask oxygen  Post-op Assessment: Report given to RN and Post -op Vital signs reviewed and stable  Post vital signs: Reviewed and stable  Last Vitals:  Vitals Value Taken Time  BP    Temp    Pulse 57 06/23/20 1011  Resp 12 06/23/20 1011  SpO2 100 % 06/23/20 1011  Vitals shown include unvalidated device data.  Last Pain:  Vitals:   06/23/20 0802  TempSrc: Oral  PainSc: 0-No pain         Complications: No complications documented.

## 2020-06-23 NOTE — Anesthesia Preprocedure Evaluation (Signed)
Anesthesia Evaluation  Patient identified by MRN, date of birth, ID band Patient awake    Reviewed: Allergy & Precautions, H&P , NPO status , Patient's Chart, lab work & pertinent test results  Airway Mallampati: II   Neck ROM: full    Dental   Pulmonary former smoker,    breath sounds clear to auscultation       Cardiovascular hypertension,  Rhythm:regular Rate:Normal     Neuro/Psych PSYCHIATRIC DISORDERS Anxiety Depression  Neuromuscular disease    GI/Hepatic GERD  ,  Endo/Other    Renal/GU      Musculoskeletal  (+) Arthritis ,   Abdominal   Peds  Hematology   Anesthesia Other Findings   Reproductive/Obstetrics                             Anesthesia Physical Anesthesia Plan  ASA: II  Anesthesia Plan: Bier Block and MAC and Bier Block-LIDOCAINE ONLY   Post-op Pain Management:    Induction: Intravenous  PONV Risk Score and Plan: 2 and Propofol infusion, Ondansetron, Midazolam and Treatment may vary due to age or medical condition  Airway Management Planned: Simple Face Mask  Additional Equipment:   Intra-op Plan:   Post-operative Plan:   Informed Consent: I have reviewed the patients History and Physical, chart, labs and discussed the procedure including the risks, benefits and alternatives for the proposed anesthesia with the patient or authorized representative who has indicated his/her understanding and acceptance.       Plan Discussed with: CRNA, Anesthesiologist and Surgeon  Anesthesia Plan Comments:         Anesthesia Quick Evaluation

## 2020-06-23 NOTE — Anesthesia Postprocedure Evaluation (Signed)
Anesthesia Post Note  Patient: Victoria Williamson  Procedure(s) Performed: CARPAL TUNNEL RELEASE (Left Hand)     Patient location during evaluation: PACU Anesthesia Type: MAC and Bier Block Level of consciousness: awake and alert Pain management: pain level controlled Vital Signs Assessment: post-procedure vital signs reviewed and stable Respiratory status: spontaneous breathing, nonlabored ventilation, respiratory function stable and patient connected to nasal cannula oxygen Cardiovascular status: stable and blood pressure returned to baseline Postop Assessment: no apparent nausea or vomiting Anesthetic complications: no   No complications documented.  Last Vitals:  Vitals:   06/23/20 1030 06/23/20 1041  BP: 116/74 135/86  Pulse: (!) 51 64  Resp: (!) 9 12  Temp:  36.4 C  SpO2: 100% 99%    Last Pain:  Vitals:   06/23/20 1041  TempSrc:   PainSc: 0-No pain                 Alyla Pietila S

## 2020-06-23 NOTE — H&P (Signed)
Victoria Williamson is an 54 y.o. female.   Chief Complaint: numbness eft handHPI: Victoria Williamson is a 54 year old right-hand-dominant female referred by Dr. Arelia Longest for consultation regarding numbness and tingling in her left hand. She is not been seen in years she has undergone a carpal tunnel release in her right in the past. She did not get the left side done at that time. She states is been going on since 1994. Her carpal tunnel release on the right side was approximately 2000. 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 She is awake and 7 out of 7 nights. She complains of relatively constant numbness and tingling thumb through middle finger. She states reading and driving seem to increase this for her. She is not able to take nonsteroidal anti-inflammatories has used occasional tramadol. She has no history of diabetes questionable history of thyroid problems that she has a history of arthritis no history of gout. Family history is positive for diabetes and arthritis negative for the remainder. She was  referred for nerve conductions with Dr. Tamsen Roers. These have been done revealing carpal tunnel syndrome on her left side with a motor delay of 6.9 sensory delay of 6.1.    Past Medical History:  Diagnosis Date  . Anxiety   . Arthritis   . Chest pain    normal coronary arteries by cardiac cath oct 29,2014  . Colon polyp   . Depression   . Dysautonomia ([place]) 12/18/2013  . Fatty liver   . Heart disease   . History of blood transfusion   . Hypertension   . PAF (paroxysmal atrial fibrillation) ([place])   . Thyroid nodule     Past Surgical History:  Procedure Laterality Date  . btl    . CARDIOVERSION N/A 05/11/2020   Procedure: CARDIOVERSION;  Surgeon: Kate Sable, MD;  Location: [facility] ORS;  Service: Cardiovascular;  Laterality: N/A;  . ENDOMETRIAL ABLATION    . right carpal tunel      Family History  Problem Relation Age of  Onset  . Heart attack Father   . Alcohol abuse Father   . Hearing loss Father   . Heart disease Father   . High Cholesterol Father   . Hypertension Father   . Heart disease Mother   . Diabetes Mother   . COPD Mother   . Hypertension Mother   . Arthritis Mother   . High Cholesterol Mother   . Kidney disease Mother   . Diabetes Other        family HX, unspecified  . Breast cancer Maternal Aunt 64  . Breast cancer Maternal Grandmother 76  . Depression Paternal Grandmother   . Heart attack Paternal Grandfather   . Heart disease Paternal Grandfather   . Heart attack Brother    Social History:  reports that she quit smoking about 5 months ago. Her smoking use included cigarettes. She has never used smokeless tobacco. She reports previous alcohol use. She reports that she does not use drugs.  Allergies:  Allergies  Allergen Reactions  . Codeine Nausea And Vomiting    Confusion and could not stay awake  . Paroxetine Hcl Other (See Comments)    Sleep all the time    No medications prior to admission.    No results found for this or any previous visit (from the past 48 hour(s)).  No results found.   Pertinent items are noted in HPI.  Height 5\' 5"  (4.039 m), weight 90.7 kg.  General appearance: alert, cooperative and appears stated age Head: Normocephalic, without obvious abnormality Neck: no JVD Resp: clear to auscultation bilaterally Cardio: regular rate and rhythm, S1, S2 normal, no murmur, click, rub or gallop GI: soft, non-tender; bowel sounds normal; no masses,  no organomegaly Extremities: numbness left hand Pulses: 2+ and symmetric Skin: Skin color, texture, turgor normal. No rashes or lesions Neurologic: Grossly normal Incision/Wound: na  Assessment/Plan Diagnosis carpal tunnel syndrome left hand  Plan: Have discussed her nerve conductions with her. She would like to proceed. She is aware that there is no guarantee to the surgery possibility of infection  recurrence injury to arteries nerves tendons complete relief symptoms dystrophy. She is scheduled for left carpal tunnel release in outpatient under regional anesthesia.   [name] 06/23/2020, 5:58 AM

## 2020-06-23 NOTE — Op Note (Signed)
NAME: Victoria Williamson RECORD NO: [id number] DATE OF BIRTH: May 27, 1966 FACILITY: [facility] LOCATION: [facility] PHYSICIAN: Wynonia Sours, MD   OPERATIVE REPORT   DATE OF PROCEDURE: 06/23/20    PREOPERATIVE DIAGNOSIS:   Carpal tunnel syndrome left hand   POSTOPERATIVE DIAGNOSIS:   Same   PROCEDURE:   Decompression median nerve left hand   SURGEON: [name], M.D.   ASSISTANT: none   ANESTHESIA:  Bier block with sedation and Local   INTRAVENOUS FLUIDS:  Per anesthesia flow sheet.   ESTIMATED BLOOD LOSS:  Minimal.   COMPLICATIONS:  None.   SPECIMENS:  none   TOURNIQUET TIME:    Total Tourniquet Time Documented: Upper Arm (Left) - 29 minutes Total: Upper Arm (Left) - 29 minutes    DISPOSITION:  Stable to PACU.   INDICATIONS: Patient is a 54 year old female with a history of numbness and tingling left hand.  Nerve conductions are positive.  She has elected undergo surgical decompression to the median nerve left side.  Preperi-and postoperative course been discussed along with risks and complications.  She is aware that there is no guarantee to the surgery the possibility of infection recurrence injury to arteries nerves tendons incomplete relief of symptoms and dystrophy.  In the preoperative area the patient is seen the extremity marked by both patient and surgeon antibiotic given  OPERATIVE COURSE: Patient is brought to the operating room where she was placed in a supine position with the left arm free.  A forearm IV regional anesthetic was carried out without difficulty under the direction of the anesthesia department.  She was prepped using ChloraPrep.  3-minute dry time was allowed timeout taken to confirm patient procedure.  A longitudinal incision was made left palm carried down through subcutaneous tissue.  Bleeders were electrocauterized with bipolar.  The palmar fascia was split.  The superficial palmar arch was identified along with flexor to  the ring little finger.  Right angle suitable retractors were placed retracting the median nerve flexor tendons radially and the ulnar nerve ulnarly.  The flexor retinaculum was then released on its ulnar border.  The subcutaneous tissue and skin was then dissected free from the distal forearm fascia proximal aspect of the flexor retinaculum a sewell and sectseeretractors were then placed allowing visualization of the proximal aspect of the flexor retinaculum distal forearm fascia was then released after dissecting the deep structures free with blunt dissection.  The release was done with blunt scissors.  The canal was explored no further lesions were identified or compression of the nerve was identified.  The motor branch entered in the muscle distally.  The wound was copious irrigated with saline.  Skin was closed erupted 4-0 nylon sutures.  Local infiltration quarter percent bupivacaine without epinephrine was given 10 cc was used.  Sterile compressive dressing with the fingers free was applied.  Deflation of the tourniquet all fingers immediately pink.  She was taken to the recovery room for observation in satisfactory condition.  She will be discharged home to return to the hand center [facility] in 1 week on Tylenol for pain with Ultram for breakthrough.  [name], MD Electronically signed, 06/23/20

## 2020-06-23 NOTE — Discharge Instructions (Addendum)

## 2020-06-23 NOTE — Anesthesia Procedure Notes (Signed)
Anesthesia Regional Block: Bier block (IV Regional)   Pre-Anesthetic Checklist: ,, timeout performed, Correct Patient, Correct Site, Correct Laterality, Correct Procedure, Correct Position, site marked, Risks and benefits discussed,  Surgical consent,  Pre-op evaluation,  At surgeon's request and post-op pain management  Laterality: Left  Prep: alcohol swabs        Procedures:,,,,,,,, #20gu IV placed  Narrative:  CRNA: Verita Lamb, CRNA

## 2020-06-23 NOTE — Brief Op Note (Signed)
06/23/2020  10:08 AM  PATIENT:  Victoria Williamson  54 y.o. female  PRE-OPERATIVE DIAGNOSIS:  LEFT CARPAL TUNNEL SYNDROME  POST-OPERATIVE DIAGNOSIS:  LEFT CARPAL TUNNEL SYNDROME  PROCEDURE:  Procedure(s) with comments: CARPAL TUNNEL RELEASE (Left) - IV REGIONAL FOREARM BLOCK  SURGEON:  Surgeon(s) and Role:    * [name], MD - Primary  PHYSICIAN ASSISTANT:   ASSISTANTS: none   ANESTHESIA:   local, regional and IV sedation  EBL: 26ml  BLOOD ADMINISTERED:none  DRAINS: none   LOCAL MEDICATIONS USED:  BUPIVICAINE   SPECIMEN:  No Specimen  DISPOSITION OF SPECIMEN:  N/A  COUNTS:  YES  TOURNIQUET:   Total Tourniquet Time Documented: Upper Arm (Left) - 29 minutes Total: Upper Arm (Left) - 29 minutes   DICTATION: .Viviann Spare Dictation  PLAN OF CARE: Discharge to home after PACU  PATIENT DISPOSITION:  PACU - hemodynamically stable.

## 2020-06-24 ENCOUNTER — Encounter (HOSPITAL_BASED_OUTPATIENT_CLINIC_OR_DEPARTMENT_OTHER): Payer: Self-pay | Admitting: Orthopedic Surgery

## 2020-06-26 ENCOUNTER — Encounter: Payer: Self-pay | Admitting: Family Medicine

## 2020-06-27 ENCOUNTER — Other Ambulatory Visit
Admission: RE | Admit: 2020-06-27 | Discharge: 2020-06-27 | Disposition: A | Discharge status: Home / Self Care | Payer: BC Managed Care – PPO | Attending: Cardiology | Admitting: Cardiology

## 2020-06-27 ENCOUNTER — Other Ambulatory Visit: Payer: Self-pay

## 2020-06-27 DIAGNOSIS — I480 Paroxysmal atrial fibrillation: Secondary | ICD-10-CM | POA: Diagnosis not present

## 2020-06-27 LAB — CBC WITH DIFFERENTIAL/PLATELET
Abs Immature Granulocytes: 0.02 10*3/uL (ref 0.00–0.07)
Basophils Absolute: 0.1 10*3/uL (ref 0.0–0.1)
Basophils Relative: 1 %
Eosinophils Absolute: 0.2 10*3/uL (ref 0.0–0.5)
Eosinophils Relative: 3 %
HCT: 40.6 % (ref 36.0–46.0)
Hemoglobin: 13.3 g/dL (ref 12.0–15.0)
Immature Granulocytes: 0 %
Lymphocytes Relative: 35 %
Lymphs Abs: 2.6 10*3/uL (ref 0.7–4.0)
MCH: 30.5 pg (ref 26.0–34.0)
MCHC: 32.8 g/dL (ref 30.0–36.0)
MCV: 93.1 fL (ref 80.0–100.0)
Monocytes Absolute: 0.7 10*3/uL (ref 0.1–1.0)
Monocytes Relative: 9 %
Neutro Abs: 3.9 10*3/uL (ref 1.7–7.7)
Neutrophils Relative %: 52 %
Platelets: 232 10*3/uL (ref 150–400)
RBC: 4.36 MIL/uL (ref 3.87–5.11)
RDW: 12.7 % (ref 11.5–15.5)
WBC: 7.5 10*3/uL (ref 4.0–10.5)
nRBC: 0 % (ref 0.0–0.2)

## 2020-06-27 LAB — BASIC METABOLIC PANEL
Anion gap: 9 (ref 5–15)
BUN: 8 mg/dL (ref 6–20)
CO2: 26 mmol/L (ref 22–32)
Calcium: 9.2 mg/dL (ref 8.9–10.3)
Chloride: 103 mmol/L (ref 98–111)
Creatinine, Ser: 0.73 mg/dL (ref 0.44–1.00)
GFR, Estimated: >60 mL/min (ref >60)
Glucose, Bld: 112 mg/dL — ABNORMAL HIGH (ref 70–99)
Potassium: 3.5 mmol/L (ref 3.5–5.1)
Sodium: 138 mmol/L (ref 135–145)

## 2020-06-29 NOTE — Telephone Encounter (Signed)
Received email from billing  Deposited $29 cash payment - mailed receipt to patient

## 2020-07-05 ENCOUNTER — Telehealth (HOSPITAL_COMMUNITY): Payer: Self-pay | Admitting: *Deleted

## 2020-07-05 NOTE — Telephone Encounter (Signed)
Reaching out to patient to offer assistance regarding upcoming cardiac imaging study; pt verbalizes understanding of appt date/time, parking situation and where to check in, pre-test NPO status and medications ordered, and verified current allergies; name and call back number provided for further questions should they arise ° °Makynli Stills Tai RN Navigator Cardiac Imaging °[facility] °[phone] office °[phone] cell ° °

## 2020-07-07 ENCOUNTER — Other Ambulatory Visit: Payer: Self-pay

## 2020-07-07 ENCOUNTER — Ambulatory Visit
Admission: RE | Admit: 2020-07-07 | Discharge: 2020-07-07 | Disposition: A | Discharge status: Home / Self Care | Payer: BC Managed Care – PPO | Source: Ambulatory Visit | Attending: Cardiology | Admitting: Cardiology

## 2020-07-07 DIAGNOSIS — I480 Paroxysmal atrial fibrillation: Secondary | ICD-10-CM

## 2020-07-07 HISTORY — DX: Basal cell carcinoma of skin, unspecified: C44.91

## 2020-07-07 MED ORDER — IOHEXOL 350 MG/ML SOLN
85.0000 mL | Freq: Once | INTRAVENOUS | Status: AC | PRN
  Administered 2020-07-07: 85 mL via INTRAVENOUS

## 2020-07-08 ENCOUNTER — Ambulatory Visit (HOSPITAL_COMMUNITY): Payer: BC Managed Care – PPO

## 2020-07-13 ENCOUNTER — Other Ambulatory Visit
Admission: RE | Admit: 2020-07-13 | Discharge: 2020-07-13 | Disposition: A | Discharge status: Home / Self Care | Payer: BC Managed Care – PPO | Source: Ambulatory Visit | Attending: Cardiology | Admitting: Cardiology

## 2020-07-13 ENCOUNTER — Other Ambulatory Visit: Payer: Self-pay

## 2020-07-13 DIAGNOSIS — Z20822 Contact with and (suspected) exposure to covid-19: Secondary | ICD-10-CM | POA: Insufficient documentation

## 2020-07-13 DIAGNOSIS — Z01812 Encounter for preprocedural laboratory examination: Secondary | ICD-10-CM | POA: Insufficient documentation

## 2020-07-14 LAB — SARS CORONAVIRUS 2 (TAT 6-24 HRS): SARS Coronavirus 2: NEGATIVE

## 2020-07-14 NOTE — Progress Notes (Signed)
Instructed patient on the following items: Arrival time 1030 Nothing to eat or drink after midnight No meds AM of procedure Responsible person to drive you home and stay with you for 24 hrs  Have you missed any doses of anti-coagulant Eliquis- hasn't missed any doses    

## 2020-07-15 ENCOUNTER — Other Ambulatory Visit: Payer: Self-pay

## 2020-07-15 ENCOUNTER — Ambulatory Visit (HOSPITAL_COMMUNITY)
Admission: RE | Admit: 2020-07-15 | Discharge: 2020-07-15 | Disposition: A | Discharge status: Home / Self Care | Payer: BC Managed Care – PPO | Attending: Cardiology | Admitting: Cardiology

## 2020-07-15 ENCOUNTER — Ambulatory Visit (HOSPITAL_COMMUNITY): Payer: BC Managed Care – PPO | Admitting: Anesthesiology

## 2020-07-15 ENCOUNTER — Encounter (HOSPITAL_COMMUNITY)
Admission: RE | Disposition: A | Discharge status: Home / Self Care | Payer: BC Managed Care – PPO | Source: Home / Self Care | Attending: Cardiology

## 2020-07-15 DIAGNOSIS — Z885 Allergy status to narcotic agent status: Secondary | ICD-10-CM | POA: Diagnosis not present

## 2020-07-15 DIAGNOSIS — Z7901 Long term (current) use of anticoagulants: Secondary | ICD-10-CM | POA: Insufficient documentation

## 2020-07-15 DIAGNOSIS — I480 Paroxysmal atrial fibrillation: Secondary | ICD-10-CM | POA: Insufficient documentation

## 2020-07-15 DIAGNOSIS — I4892 Unspecified atrial flutter: Secondary | ICD-10-CM | POA: Insufficient documentation

## 2020-07-15 DIAGNOSIS — I483 Typical atrial flutter: Secondary | ICD-10-CM

## 2020-07-15 DIAGNOSIS — Z8249 Family history of ischemic heart disease and other diseases of the circulatory system: Secondary | ICD-10-CM | POA: Diagnosis not present

## 2020-07-15 DIAGNOSIS — Z79899 Other long term (current) drug therapy: Secondary | ICD-10-CM | POA: Diagnosis not present

## 2020-07-15 DIAGNOSIS — Z87891 Personal history of nicotine dependence: Secondary | ICD-10-CM | POA: Insufficient documentation

## 2020-07-15 HISTORY — PX: ATRIAL FIBRILLATION ABLATION: EP1191

## 2020-07-15 SURGERY — ATRIAL FIBRILLATION ABLATION
Anesthesia: General

## 2020-07-15 MED ORDER — APIXABAN 5 MG PO TABS
5.0000 mg | ORAL_TABLET | Freq: Two times a day (BID) | ORAL | Status: DC
  Administered 2020-07-15: 5 mg via ORAL
  Filled 2020-07-15: qty 1

## 2020-07-15 MED ORDER — ROCURONIUM BROMIDE 10 MG/ML (PF) SYRINGE
PREFILLED_SYRINGE | INTRAVENOUS | Status: DC | PRN
  Administered 2020-07-15: 60 mg via INTRAVENOUS
  Administered 2020-07-15: 30 mg via INTRAVENOUS

## 2020-07-15 MED ORDER — SODIUM CHLORIDE 0.9 % IV SOLN: 250.0000 mL | INTRAVENOUS | Status: DC | PRN

## 2020-07-15 MED ORDER — SUGAMMADEX SODIUM 200 MG/2ML IV SOLN
INTRAVENOUS | Status: DC | PRN
  Administered 2020-07-15: 200 mg via INTRAVENOUS

## 2020-07-15 MED ORDER — HEPARIN SODIUM (PORCINE) 1000 UNIT/ML IJ SOLN
INTRAMUSCULAR | Status: AC
  Filled 2020-07-15: qty 1

## 2020-07-15 MED ORDER — HEPARIN SODIUM (PORCINE) 1000 UNIT/ML IJ SOLN
INTRAMUSCULAR | Status: DC | PRN
  Administered 2020-07-15: 3000 [IU] via INTRAVENOUS
  Administered 2020-07-15: 14000 [IU] via INTRAVENOUS
  Administered 2020-07-15: 4000 [IU] via INTRAVENOUS

## 2020-07-15 MED ORDER — MIDAZOLAM HCL 2 MG/2ML IJ SOLN
INTRAMUSCULAR | Status: DC | PRN
  Administered 2020-07-15: 2 mg via INTRAVENOUS

## 2020-07-15 MED ORDER — PANTOPRAZOLE SODIUM 40 MG PO TBEC: 40.0000 mg | DELAYED_RELEASE_TABLET | Freq: Every day | ORAL | 0 refills | Status: DC

## 2020-07-15 MED ORDER — ONDANSETRON HCL 4 MG/2ML IJ SOLN
4.0000 mg | Freq: Once | INTRAMUSCULAR | Status: AC
  Administered 2020-07-15: 4 mg via INTRAVENOUS

## 2020-07-15 MED ORDER — HEPARIN (PORCINE) IN NACL 1000-0.9 UT/500ML-% IV SOLN
INTRAVENOUS | Status: AC
  Filled 2020-07-15: qty 2000

## 2020-07-15 MED ORDER — LIDOCAINE 2% (20 MG/ML) 5 ML SYRINGE
INTRAMUSCULAR | Status: DC | PRN
  Administered 2020-07-15: 60 mg via INTRAVENOUS

## 2020-07-15 MED ORDER — SODIUM CHLORIDE 0.9 % IV SOLN: INTRAVENOUS | Status: DC

## 2020-07-15 MED ORDER — PROPOFOL 10 MG/ML IV BOLUS
INTRAVENOUS | Status: DC | PRN
  Administered 2020-07-15: 200 mg via INTRAVENOUS

## 2020-07-15 MED ORDER — FLECAINIDE ACETATE 50 MG PO TABS
50.0000 mg | ORAL_TABLET | Freq: Two times a day (BID) | ORAL | Status: DC
  Filled 2020-07-15: qty 1

## 2020-07-15 MED ORDER — PROTAMINE SULFATE 10 MG/ML IV SOLN
INTRAVENOUS | Status: DC | PRN
  Administered 2020-07-15: 35 mg via INTRAVENOUS

## 2020-07-15 MED ORDER — PANTOPRAZOLE SODIUM 40 MG PO TBEC
40.0000 mg | DELAYED_RELEASE_TABLET | Freq: Every day | ORAL | Status: DC
  Administered 2020-07-15: 40 mg via ORAL
  Filled 2020-07-15: qty 1

## 2020-07-15 MED ORDER — DEXAMETHASONE SODIUM PHOSPHATE 10 MG/ML IJ SOLN
INTRAMUSCULAR | Status: DC | PRN
  Administered 2020-07-15: 5 mg via INTRAVENOUS

## 2020-07-15 MED ORDER — ACETAMINOPHEN 325 MG PO TABS
650.0000 mg | ORAL_TABLET | ORAL | Status: DC | PRN
  Filled 2020-07-15: qty 2

## 2020-07-15 MED ORDER — ELIQUIS 5 MG PO TABS: 5.0000 mg | ORAL_TABLET | Freq: Two times a day (BID) | ORAL | Status: DC

## 2020-07-15 MED ORDER — SODIUM CHLORIDE 0.9% FLUSH: 3.0000 mL | INTRAVENOUS | Status: DC | PRN

## 2020-07-15 MED ORDER — PHENYLEPHRINE 40 MCG/ML (10ML) SYRINGE FOR IV PUSH (FOR BLOOD PRESSURE SUPPORT)
PREFILLED_SYRINGE | INTRAVENOUS | Status: DC | PRN
  Administered 2020-07-15: 120 ug via INTRAVENOUS

## 2020-07-15 MED ORDER — PHENYLEPHRINE HCL-NACL 10-0.9 MG/250ML-% IV SOLN
INTRAVENOUS | Status: DC | PRN
  Administered 2020-07-15: 35 ug/min via INTRAVENOUS

## 2020-07-15 MED ORDER — ONDANSETRON HCL 4 MG/2ML IJ SOLN
INTRAMUSCULAR | Status: DC | PRN
  Administered 2020-07-15: 4 mg via INTRAVENOUS

## 2020-07-15 MED ORDER — HEPARIN (PORCINE) IN NACL 2-0.9 UNITS/ML
INTRAMUSCULAR | Status: AC | PRN
  Administered 2020-07-15 (×4): 500 mL

## 2020-07-15 MED ORDER — METOPROLOL TARTRATE 12.5 MG HALF TABLET
12.5000 mg | ORAL_TABLET | Freq: Two times a day (BID) | ORAL | Status: DC
  Filled 2020-07-15: qty 1

## 2020-07-15 MED ORDER — ONDANSETRON HCL 4 MG/2ML IJ SOLN
4.0000 mg | Freq: Four times a day (QID) | INTRAMUSCULAR | Status: DC | PRN
  Filled 2020-07-15: qty 2

## 2020-07-15 MED ORDER — HEPARIN SODIUM (PORCINE) 1000 UNIT/ML IJ SOLN
INTRAMUSCULAR | Status: DC | PRN
  Administered 2020-07-15: 1000 [IU] via INTRAVENOUS

## 2020-07-15 MED ORDER — FENTANYL CITRATE (PF) 100 MCG/2ML IJ SOLN
INTRAMUSCULAR | Status: DC | PRN
  Administered 2020-07-15: 100 ug via INTRAVENOUS

## 2020-07-15 MED ORDER — SODIUM CHLORIDE 0.9% FLUSH: 3.0000 mL | Freq: Two times a day (BID) | INTRAVENOUS | Status: DC

## 2020-07-15 SURGICAL SUPPLY — 21 items
BLANKET WARM UNDERBOD FULL ACC (MISCELLANEOUS) ×2 IMPLANT
CATH 8FR REPROCESSED SOUNDSTAR (CATHETERS) ×2 IMPLANT
CATH 8FR SOUNDSTAR REPROCESSED (CATHETERS) IMPLANT
CATH MAPPNG PENTARAY F 2-6-2MM (CATHETERS) IMPLANT
CATH S CIRCA THERM PROBE 10F (CATHETERS) ×1 IMPLANT
CATH SMTCH THERMOCOOL SF DF (CATHETERS) ×1 IMPLANT
CATH WEB BI DIR CSDF CRV REPRO (CATHETERS) ×1 IMPLANT
CLOSURE PERCLOSE PROSTYLE (VASCULAR PRODUCTS) ×3 IMPLANT
COVER SWIFTLINK CONNECTOR (BAG) ×2 IMPLANT
MAT PREVALON FULL STRYKER (MISCELLANEOUS) ×1 IMPLANT
PACK EP LATEX FREE (CUSTOM PROCEDURE TRAY) ×2
PACK EP LF (CUSTOM PROCEDURE TRAY) ×1 IMPLANT
PAD PRO RADIOLUCENT 2001M-C (PAD) ×2 IMPLANT
PATCH CARTO3 (PAD) ×1 IMPLANT
PENTARAY F 2-6-2MM (CATHETERS) ×2
SHEATH BAYLIS TRANSSEPTAL 98CM (NEEDLE) ×1 IMPLANT
SHEATH CARTO VIZIGO SM CVD (SHEATH) ×1 IMPLANT
SHEATH PINNACLE 8F 10CM (SHEATH) ×2 IMPLANT
SHEATH PINNACLE 9F 10CM (SHEATH) ×1 IMPLANT
SHEATH PROBE COVER 6X72 (BAG) ×1 IMPLANT
TUBING SMART ABLATE COOLFLOW (TUBING) ×1 IMPLANT

## 2020-07-15 NOTE — Progress Notes (Signed)
Up and walked and tolerated well; bilat groins stable, no bleeding or hematoma 

## 2020-07-15 NOTE — Anesthesia Procedure Notes (Signed)
Procedure Name: Intubation Date/Time: 07/15/2020 12:40 PM Performed by: Rande Brunt, CRNA Pre-anesthesia Checklist: Patient identified, Emergency Drugs available, Suction available and Patient being monitored Patient Re-evaluated:Patient Re-evaluated prior to induction Oxygen Delivery Method: Circle System Utilized Preoxygenation: Pre-oxygenation with 100% oxygen Induction Type: IV induction Ventilation: Mask ventilation without difficulty Laryngoscope Size: Mac and 3 Grade View: Grade II Tube type: Oral Tube size: 7.0 mm Number of attempts: 1 Airway Equipment and Method: Stylet Placement Confirmation: ETT inserted through vocal cords under direct vision,  positive ETCO2 and breath sounds checked- equal and bilateral Secured at: 22 cm Tube secured with: Tape Dental Injury: Teeth and Oropharynx as per pre-operative assessment

## 2020-07-15 NOTE — Anesthesia Postprocedure Evaluation (Signed)
Anesthesia Post Note  Patient: Victoria Williamson  Procedure(s) Performed: ATRIAL FIBRILLATION ABLATION (N/A )     Patient location during evaluation: PACU Anesthesia Type: General Level of consciousness: awake and alert Pain management: pain level controlled Vital Signs Assessment: post-procedure vital signs reviewed and stable Respiratory status: spontaneous breathing, nonlabored ventilation, respiratory function stable and patient connected to nasal cannula oxygen Cardiovascular status: blood pressure returned to baseline and stable Postop Assessment: no apparent nausea or vomiting Anesthetic complications: no   No complications documented.  Last Vitals:  Vitals:   07/15/20 1757 07/15/20 1827  BP: (!) 148/85 (!) 154/82  Pulse: 90 88  Resp: 13 14  Temp:    SpO2: 99% 100%    Last Pain:  Vitals:   07/15/20 1510  TempSrc:   PainSc: 0-No pain                 Effie Berkshire

## 2020-07-15 NOTE — Progress Notes (Signed)
Dr Quentin Ore in and notified of client with cont nausea and order noted

## 2020-07-15 NOTE — Transfer of Care (Signed)
Immediate Anesthesia Transfer of Care Note  Patient: Victoria Williamson  Procedure(s) Performed: ATRIAL FIBRILLATION ABLATION (N/A )  Patient Location: Cath Lab PACU  Anesthesia Type:General  Level of Consciousness: awake and alert   Airway & Oxygen Therapy: Patient Spontanous Breathing  Post-op Assessment: Report given to RN and Post -op Vital signs reviewed and stable  Post vital signs: Reviewed and stable  Last Vitals:  Vitals Value Taken Time  BP 122/73 07/15/20 1509  Temp    Pulse 86 07/15/20 1510  Resp 13 07/15/20 1510  SpO2 94 % 07/15/20 1510  Vitals shown include unvalidated device data.  Last Pain:  Vitals:   07/15/20 1510  TempSrc:   PainSc: 0-No pain         Complications: No complications documented.

## 2020-07-15 NOTE — H&P (Signed)
Electrophysiology Office Note:    Date:  05/25/2020   ID:  Victoria Williamson, DOB 13-Nov-1965, MRN [id number]  PCP:  Elby Beck, FNP   [facility] Cardiologist:  Kate Sable, MD  [facility] Electrophysiologist:  None   Referring MD: Kate Sable, MD   Chief Complaint: Paroxysmal atrial fibrillation  History of Present Illness:    Victoria Williamson is a 54 y.o. female who presents for an evaluation of symptomatic paroxysmal atrial fibrillation at the request of Dr. Garen Lah. Their medical history includes hypertension, depression.  Despite being on flecainide and metoprolol, she continues to have intermittent episodes of atrial fibrillation.  She is tolerating her Eliquis without any bleeding issues.  When she is in atrial fibrillation she feels extremely fatigued.  No syncope or presyncope.  She is interested in and avoiding long-term therapy for flecainide.      Past Medical History:  Diagnosis Date  . Anxiety   . Arthritis   . Chest pain    normal coronary arteries by cardiac cath oct 29,2014  . Colon polyp   . Depression   . Dysautonomia ([place]) 12/18/2013  . Heart disease   . History of blood transfusion   . Hypertension   . Thyroid nodule          Past Surgical History:  Procedure Laterality Date  . btl    . CARDIOVERSION N/A 05/11/2020   Procedure: CARDIOVERSION;  Surgeon: Kate Sable, MD;  Location: [facility] ORS;  Service: Cardiovascular;  Laterality: N/A;  . ENDOMETRIAL ABLATION    . right carpal tunel      Current Medications: Active Medications      Current Meds  Medication Sig  . ALPRAZolam (XANAX) 0.25 MG tablet Take 0.25 mg by mouth daily as needed for anxiety or sleep.   Marland Kitchen ELIQUIS 5 MG TABS tablet Take 5 mg by mouth 2 (two) times daily.  . flecainide (TAMBOCOR) 50 MG tablet Take 1 tablet (50 mg total) by mouth 2 (two) times daily.  . furosemide (LASIX) 20 MG tablet Take 20 mg by mouth daily.   . metoprolol tartrate (LOPRESSOR) 25 MG tablet Take 12.5 mg by mouth 2 (two) times daily.  . mirtazapine (REMERON) 7.5 MG tablet Take 7.5 mg by mouth at bedtime. Taking 1/2 tab daily  . sertraline (ZOLOFT) 50 MG tablet Take 50 mg by mouth daily.  . traMADol (ULTRAM) 50 MG tablet Take 50 mg by mouth daily as needed for moderate pain (Back pain).        Allergies:   Codeine and Paroxetine hcl   Social History        Socioeconomic History  . Marital status: Married    Spouse name: Not on file  . Number of children: Not on file  . Years of education: Not on file  . Highest education level: Not on file  Occupational History  . Not on file  Tobacco Use  . Smoking status: Former Smoker    Types: Cigarettes    Quit date: 09/29/2019    Years since quitting: 0.6  . Smokeless tobacco: Never Used  Vaping Use  . Vaping Use: Never used  Substance and Sexual Activity  . Alcohol use: Never  . Drug use: Never  . Sexual activity: Yes    Partners: Male  Other Topics Concern  . Not on file  Social History Narrative   Lives at home with husband   Social Determinants of Health      Financial Resource Strain:   .  Difficulty of Paying Living Expenses: Not on file  Food Insecurity:   . Worried About Charity fundraiser in the Last Year: Not on file  . Ran Out of Food in the Last Year: Not on file  Transportation Needs:   . Lack of Transportation (Medical): Not on file  . Lack of Transportation (Non-Medical): Not on file  Physical Activity:   . Days of Exercise per Week: Not on file  . Minutes of Exercise per Session: Not on file  Stress:   . Feeling of Stress : Not on file  Social Connections:   . Frequency of Communication with Friends and Family: Not on file  . Frequency of Social Gatherings with Friends and Family: Not on file  . Attends Religious Services: Not on file  . Active Member of Clubs or Organizations: Not on file  . Attends Archivist  Meetings: Not on file  . Marital Status: Not on file     Family History: The patient's family history includes Alcohol abuse in her father; Arthritis in her mother; Breast cancer (age of onset: 70) in her maternal aunt; Breast cancer (age of onset: 10) in her maternal grandmother; COPD in her mother; Depression in her paternal grandmother; Diabetes in her mother and another family member; Hearing loss in her father; Heart attack in her brother, father, and paternal grandfather; Heart disease in her father, mother, and paternal grandfather; High Cholesterol in her father and mother; Hypertension in her father and mother; Kidney disease in her mother.  ROS:   Please see the history of present illness.    All other systems reviewed and are negative.  EKGs/Labs/Other Studies Reviewed:    The following studies were reviewed today: Prior records, echocardiogram  EKG:  The ekg ordered today demonstrates normal rhythm, QTc 450, QRS duration 92  March 17, 2020 EKG demonstrates typical atrial flutter with ventricular rate of 147 bpm  May 10, 2020 EKG demonstrates atrial fibrillation with rapid ventricular sponsor 174 bpm  May 24, 2020 echocardiogram personally reviewed Normal left ventricular function, 65% Right ventricular function normal No significant valvular abnormalities  Recent Labs: 12/04/2019: B Natriuretic Peptide 56.0; TSH 6.513 03/17/2020: ALT 97 05/10/2020: BUN 7; Creatinine, Ser 0.71; Hemoglobin 13.8; Platelets 217; Potassium 4.0; Sodium 140  Recent Lipid Panel Labs (Brief)  No results found for: CHOL, TRIG, HDL, CHOLHDL, VLDL, LDLCALC, LDLDIRECT    Physical Exam:    VS:  BP 100/60   Pulse 62   Ht 5\' 5"  (1.651 m)   Wt 200 lb (90.7 kg)   SpO2 98%   BMI 33.28 kg/m        Wt Readings from Last 3 Encounters:  05/25/20 200 lb (90.7 kg)  05/23/20 200 lb (90.7 kg)  05/11/20 203 lb (92.1 kg)     GEN:  Well nourished, well developed in no acute  distress HEENT: Normal NECK: No JVD; No carotid bruits LYMPHATICS: No lymphadenopathy CARDIAC: RRR, no murmurs, rubs, gallops RESPIRATORY:  Clear to auscultation without rales, wheezing or rhonchi  ABDOMEN: Soft, non-tender, non-distended MUSCULOSKELETAL:  No edema; No deformity  SKIN: Warm and dry NEUROLOGIC:  Alert and oriented x 3 PSYCHIATRIC:  Normal affect   ASSESSMENT:    1. Paroxysmal A-fib (HCC)    PLAN:    In order of problems listed above:  1. Symptomatic paroxysmal atrial fibrillation and flutter Patient continues to have symptomatic paroxysms of atrial fibrillation and flutter despite treatment with flecainide and metoprolol.  She is tolerating Eliquis  well.  I discussed the options including antiarrhythmic therapy versus ablation and she is interested in ablation management.  We will plan for a PVI plus typical flutter ablation.  Risk, benefits, and alternatives to EP study and radiofrequency ablation for afib and flutter were also discussed in detail today. These risks include but are not limited to stroke, bleeding, vascular damage, tamponade, perforation, damage to the esophagus, lungs, and other structures, pulmonary vein stenosis, worsening renal function, and death. The patient understands these risk and wishes to proceed.  We will therefore proceed with catheter ablation at the next available time.     -------------------------------------------------------------------------------- I have seen, examined the patient, and reviewed the above assessment and plan.    Plan for PVI and CTI.   Vickie Epley, MD 07/15/2020 12:10 PM

## 2020-07-15 NOTE — Discharge Instructions (Signed)
Cardiac Ablation, Care After This sheet gives you information about how to care for yourself after your procedure. Your health care provider may also give you more specific instructions. If you have problems or questions, contact your health care provider. What can I expect after the procedure? After the procedure, it is common to have:  Bruising around your puncture site.  Tenderness around your puncture site.  Skipped heartbeats.  Tiredness (fatigue). Follow these instructions at home: Puncture site care   Follow instructions from your health care provider about how to take care of your puncture site. Make sure you: ? Wash your hands with soap and water before you change your bandage (dressing). If soap and water are not available, use hand sanitizer. ? Change your dressing as told by your health care provider. ? Leave stitches (sutures), skin glue, or adhesive strips in place. These skin closures may need to stay in place for up to 2 weeks. If adhesive strip edges start to loosen and curl up, you may trim the loose edges. Do not remove adhesive strips completely unless your health care provider tells you to do that.  Check your puncture site every day for signs of infection. Check for: ? Redness, swelling, or pain. ? Fluid or blood. If your puncture site starts to bleed, lie down on your back, apply firm pressure to the area, and contact your health care provider. ? Warmth. ? Pus or a bad smell. Driving  Ask your health care provider when it is safe for you to drive again after the procedure.  Do not drive or use heavy machinery while taking prescription pain medicine.  Do not drive for 24 hours if you were given a medicine to help you relax (sedative) during your procedure. Activity  Avoid activities that take a lot of effort for at least 3 days after your procedure.  Do not lift anything that is heavier than 10 lb (4.5 kg), or the limit that you are told, until your health  care provider says that it is safe.  Return to your normal activities as told by your health care provider. Ask your health care provider what activities are safe for you. General instructions  Take over-the-counter and prescription medicines only as told by your health care provider.  Do not use any products that contain nicotine or tobacco, such as cigarettes and e-cigarettes. If you need help quitting, ask your health care provider.  Do not take baths, swim, or use a hot tub until your health care provider approves.  Do not drink alcohol for 24 hours after your procedure.  Keep all follow-up visits as told by your health care provider. This is important. Contact a health care provider if:  You have redness, mild swelling, or pain around your puncture site.  You have fluid or blood coming from your puncture site that stops after applying firm pressure to the area.  Your puncture site feels warm to the touch.  You have pus or a bad smell coming from your puncture site.  You have a fever.  You have chest pain or discomfort that spreads to your neck, jaw, or arm.  You are sweating a lot.  You feel nauseous.  You have a fast or irregular heartbeat.  You have shortness of breath.  You are dizzy or light-headed and feel the need to lie down.  You have pain or numbness in the arm or leg closest to your puncture site. Get help right away if:  Your puncture   site suddenly swells.  Your puncture site is bleeding and the bleeding does not stop after applying firm pressure to the area. These symptoms may represent a serious problem that is an emergency. Do not wait to see if the symptoms will go away. Get medical help right away. Call your local emergency services (911 in the [place] Do not drive yourself to the hospital. Summary  After the procedure, it is normal to have bruising and tenderness at the puncture site in your groin, neck, or forearm.  Check your puncture site every  day for signs of infection.  Get help right away if your puncture site is bleeding and the bleeding does not stop after applying firm pressure to the area. This is a medical emergency. This information is not intended to replace advice given to you by your health care provider. Make sure you discuss any questions you have with your health care provider. Document Revised: 07/05/2017 Document Reviewed: 11/01/2016 Elsevier Patient Education  2020 [employer]  

## 2020-07-15 NOTE — Progress Notes (Signed)
No further nausea; up to bedside commode and tolerated well; bilat groins stable, no bleeding or hematoma

## 2020-07-15 NOTE — Anesthesia Preprocedure Evaluation (Addendum)
Anesthesia Evaluation  Patient identified by MRN, date of birth, ID band Patient awake    Reviewed: Allergy & Precautions, NPO status , Patient's Chart, lab work & pertinent test results  Airway Mallampati: II  TM Distance: >3 FB Neck ROM: Full    Dental no notable dental hx.    Pulmonary former smoker,    Pulmonary exam normal breath sounds clear to auscultation       Cardiovascular hypertension, Pt. on home beta blockers + dysrhythmias Atrial Fibrillation  Rhythm:Irregular Rate:Normal     Neuro/Psych PSYCHIATRIC DISORDERS Anxiety Depression negative neurological ROS     GI/Hepatic negative GI ROS, Neg liver ROS,   Endo/Other  negative endocrine ROS  Renal/GU negative Renal ROS     Musculoskeletal negative musculoskeletal ROS (+)   Abdominal (+) + obese,   Peds  Hematology negative hematology ROS (+)   Anesthesia Other Findings atrial fibrillation  Reproductive/Obstetrics                            Anesthesia Physical Anesthesia Plan  ASA: III  Anesthesia Plan: General   Post-op Pain Management:    Induction: Intravenous  PONV Risk Score and Plan: 3 and Ondansetron, Dexamethasone, Midazolam and Treatment may vary due to age or medical condition  Airway Management Planned: Oral ETT  Additional Equipment:   Intra-op Plan:   Post-operative Plan: Extubation in OR  Informed Consent: I have reviewed the patients History and Physical, chart, labs and discussed the procedure including the risks, benefits and alternatives for the proposed anesthesia with the patient or authorized representative who has indicated his/her understanding and acceptance.     Dental advisory given  Plan Discussed with: CRNA  Anesthesia Plan Comments:        Anesthesia Quick Evaluation

## 2020-07-15 NOTE — Progress Notes (Signed)
Cardiac monitor shows sinus rhythm without ectopy

## 2020-07-18 ENCOUNTER — Telehealth: Payer: Self-pay | Admitting: Cardiology

## 2020-07-18 ENCOUNTER — Encounter (HOSPITAL_COMMUNITY): Payer: Self-pay | Admitting: Cardiology

## 2020-07-18 LAB — POCT ACTIVATED CLOTTING TIME
Activated Clotting Time: 315 seconds
Activated Clotting Time: 327 seconds

## 2020-07-18 NOTE — Telephone Encounter (Signed)
Returned call to Pt.  Advised she can remove her bandage.  Advised if she had a stitch it was removed before she was discharged home.  Next appt in January with afib clinic.  Pt indicates understanding.

## 2020-07-18 NOTE — Telephone Encounter (Signed)
Patient calling in post cath from 12/10 with a few questions. -When can patient take bandages off groin area? -is she supposed to be following up with someone on Wednesday, her husband stated something -Does patient need to have stitches removed in office or will they dissolve

## 2020-08-12 ENCOUNTER — Encounter (HOSPITAL_COMMUNITY): Payer: Self-pay | Admitting: Physician Assistant

## 2020-08-12 ENCOUNTER — Other Ambulatory Visit: Payer: Self-pay

## 2020-08-12 ENCOUNTER — Ambulatory Visit (HOSPITAL_COMMUNITY)
Admission: RE | Admit: 2020-08-12 | Discharge: 2020-08-12 | Disposition: A | Discharge status: Home / Self Care | Payer: BC Managed Care – PPO | Source: Ambulatory Visit | Attending: Physician Assistant | Admitting: Physician Assistant

## 2020-08-12 VITALS — BP 112/76 | HR 77 | Ht 65.0 in | Wt 205.6 lb

## 2020-08-12 DIAGNOSIS — Z6834 Body mass index (BMI) 34.0-34.9, adult: Secondary | ICD-10-CM | POA: Insufficient documentation

## 2020-08-12 DIAGNOSIS — Z7901 Long term (current) use of anticoagulants: Secondary | ICD-10-CM | POA: Diagnosis not present

## 2020-08-12 DIAGNOSIS — I480 Paroxysmal atrial fibrillation: Secondary | ICD-10-CM | POA: Diagnosis present

## 2020-08-12 DIAGNOSIS — E669 Obesity, unspecified: Secondary | ICD-10-CM | POA: Diagnosis not present

## 2020-08-12 DIAGNOSIS — Z79899 Other long term (current) drug therapy: Secondary | ICD-10-CM | POA: Insufficient documentation

## 2020-08-12 DIAGNOSIS — I10 Essential (primary) hypertension: Secondary | ICD-10-CM | POA: Insufficient documentation

## 2020-08-12 DIAGNOSIS — Z87891 Personal history of nicotine dependence: Secondary | ICD-10-CM | POA: Insufficient documentation

## 2020-08-12 DIAGNOSIS — I471 Supraventricular tachycardia: Secondary | ICD-10-CM | POA: Diagnosis not present

## 2020-08-12 MED ORDER — FLECAINIDE ACETATE 50 MG PO TABS: 50.0000 mg | ORAL_TABLET | Freq: Two times a day (BID) | ORAL | 3 refills | Status: DC

## 2020-08-12 NOTE — Progress Notes (Signed)
Primary Care Physician: Elby Beck, FNP (Inactive) Primary Cardiologist: Dr Garen Lah Primary Electrophysiologist: Dr Quentin Ore Referring Physician: Dr Quentin Ore   Victoria Williamson is a 55 y.o. female with a history of HTN, SVT, typical atrial flutter, and atrial fibrillation who presents for follow up in the [facility]. The patient was initially diagnosed with atrial fibrillation 09/2019 after presenting with symptoms of SOB and weakness. Patient is on Eliquis for a CHADS2VASC score of 2. She was maintained on flecainide but this failed to control her afib symptoms. She underwent afib and flutter ablation with Dr Quentin Ore on 07/15/20. She reports that she did have two brief episodes of heart racing soon after the ablation but has had none since. She denies CP, swallowing pain, or groin issues.   Today, she denies symptoms of palpitations, chest pain, shortness of breath, orthopnea, PND, lower extremity edema, dizziness, presyncope, syncope, snoring, daytime somnolence, bleeding, or neurologic sequela. The patient is tolerating medications without difficulties and is otherwise without complaint today.    Atrial Fibrillation Risk Factors:  she does not have symptoms or diagnosis of sleep apnea. she does not have a history of rheumatic fever.   she has a BMI of Body mass index is 34.21 kg/m.Marland Kitchen Filed Weights   08/12/20 1126  Weight: 93.3 kg    Family History  Problem Relation Age of Onset  . Heart attack Father   . Alcohol abuse Father   . Hearing loss Father   . Heart disease Father   . High Cholesterol Father   . Hypertension Father   . Heart disease Mother   . Diabetes Mother   . COPD Mother   . Hypertension Mother   . Arthritis Mother   . High Cholesterol Mother   . Kidney disease Mother   . Diabetes Other        family HX, unspecified  . Breast cancer Maternal Aunt 64  . Breast cancer Maternal Grandmother 54  . Depression Paternal  Grandmother   . Heart attack Paternal Grandfather   . Heart disease Paternal Grandfather   . Heart attack Brother      Atrial Fibrillation Management history:  Previous antiarrhythmic drugs: flecainide Previous cardioversions: none Previous ablations: 07/15/20 CHADS2VASC score: 2 Anticoagulation history: Eliquis   Past Medical History:  Diagnosis Date  . Anxiety   . Arthritis   . Chest pain    normal coronary arteries by cardiac cath oct 29,2014  . Colon polyp   . Depression   . Dysautonomia ([place]) 12/18/2013  . Fatty liver   . Heart disease   . History of blood transfusion   . Hypertension   . PAF (paroxysmal atrial fibrillation) ([place])   . Skin cancer, basal cell   . Thyroid nodule    Past Surgical History:  Procedure Laterality Date  . ATRIAL FIBRILLATION ABLATION N/A 07/15/2020   Procedure: ATRIAL FIBRILLATION ABLATION;  Surgeon: Vickie Epley, MD;  Location: [facility];  Service: Cardiovascular;  Laterality: N/A;  . btl    . CARDIOVERSION N/A 05/11/2020   Procedure: CARDIOVERSION;  Surgeon: Kate Sable, MD;  Location: [facility];  Service: Cardiovascular;  Laterality: N/A;  . CARPAL TUNNEL RELEASE Left 06/23/2020   Procedure: CARPAL TUNNEL RELEASE;  Surgeon: [name], MD;  Location: [facility];  Service: Orthopedics;  Laterality: Left;  IV REGIONAL FOREARM BLOCK  . ENDOMETRIAL ABLATION    . right carpal tunel      Current Outpatient Medications  Medication Sig Dispense Refill  . ELIQUIS 5 MG TABS tablet Take 1 tablet (5 mg total) by mouth 2 (two) times daily. 60 tablet   . flecainide (TAMBOCOR) 50 MG tablet Take 1 tablet (50 mg total) by mouth 2 (two) times daily. 30 tablet 5  . furosemide (LASIX) 20 MG tablet Take 1 tablet (20 mg total) by mouth daily as needed. 30 tablet   . metoprolol tartrate (LOPRESSOR) 25 MG tablet Take 12.5 mg by mouth 2 (two) times daily.    . mirtazapine (REMERON) 15 MG tablet Take 15 mg by mouth at  bedtime.    . pantoprazole (PROTONIX) 40 MG tablet Take 1 tablet (40 mg total) by mouth daily. 45 tablet 0  . sertraline (ZOLOFT) 100 MG tablet Take 50 mg by mouth daily.     No current facility-administered medications for this encounter.    Allergies  Allergen Reactions  . Codeine Nausea And Vomiting    Confusion and could not stay awake  . Paroxetine Hcl Other (See Comments)    Sleep all the time    Social History   Socioeconomic History  . Marital status: Married    Spouse name: Not on file  . Number of children: Not on file  . Years of education: Not on file  . Highest education level: Not on file  Occupational History  . Not on file  Tobacco Use  . Smoking status: Former Smoker    Types: Cigarettes    Quit date: 12/29/2019    Years since quitting: 0.6  . Smokeless tobacco: Never Used  Vaping Use  . Vaping Use: Every day  Substance and Sexual Activity  . Alcohol use: Not Currently  . Drug use: Never  . Sexual activity: Yes    Partners: Male  Other Topics Concern  . Not on file  Social History Narrative   Lives at home with husband   Social Determinants of Health   Financial Resource Strain: Not on file  Food Insecurity: Not on file  Transportation Needs: Not on file  Physical Activity: Not on file  Stress: Not on file  Social Connections: Not on file  Intimate Partner Violence: Not on file     ROS- All systems are reviewed and negative except as per the HPI above.  Physical Exam: Vitals:   08/12/20 1126  BP: 112/76  Pulse: 77  Weight: 93.3 kg  Height: 5\' 5"  (1.651 m)    GEN- The patient is well appearing obese female, alert and oriented x 3 today.   Head- normocephalic, atraumatic Eyes-  Sclera clear, conjunctiva pink Ears- hearing intact Oropharynx- clear Neck- supple  Lungs- Clear to ausculation bilaterally, normal work of breathing Heart- Regular rate and rhythm, no murmurs, rubs or gallops  GI- soft, NT, ND, + BS Extremities- no  clubbing, cyanosis, or edema MS- no significant deformity or atrophy Skin- no rash or lesion Psych- euthymic mood, full affect Neuro- strength and sensation are intact  Wt Readings from Last 3 Encounters:  08/12/20 93.3 kg  07/15/20 90.3 kg  06/23/20 90.2 kg    EKG today demonstrates  SR  Vent. rate 77 BPM PR interval 146 ms QRS duration 94 ms QT/QTc 414/468 ms  Echo 05/24/20 demonstrated  1. Left ventricular ejection fraction, by estimation, is 60 to 65%. The  left ventricle has normal function. The left ventricle has no regional  wall motion abnormalities. Left ventricular diastolic parameters are  indeterminate.  2. Right ventricular systolic function is normal. The  right ventricular  size is normal. There is normal pulmonary artery systolic pressure.  3. The mitral valve is normal in structure. Trivial mitral valve  regurgitation. No evidence of mitral stenosis.  4. The aortic valve has an indeterminant number of cusps. Aortic valve  regurgitation is not visualized. No aortic stenosis is present.  5. The inferior vena cava is normal in size with greater than 50%  respiratory variability, suggesting right atrial pressure of 3 mmHg.   Epic records are reviewed at length today  CHA2DS2-VASc Score = 2  The patient's score is based upon: CHF History: No HTN History: Yes Diabetes History: No Stroke History: No Vascular Disease History: No Age Score: 0 Gender Score: 1      ASSESSMENT AND PLAN: 1. Paroxysmal Atrial Fibrillation/typical atrial flutter The patient's CHA2DS2-VASc score is 2, indicating a 2.2% annual risk of stroke.   S/p afib and flutter ablation 07/15/20 Reassured patient that some afib is not uncommon for the first 3 months post ablation.  Continue flecainide 50 mg BID Continue Lopressor 25 mg BID Continue Eliquis 5 mg BID with no missed doses for at least 3 months post ablation.   2. Obesity Body mass index is 34.21 kg/m. Lifestyle  modification was discussed at length including regular exercise and weight reduction.  3. HTN Stable, no changes today.   Follow up with Dr Quentin Ore per recall and Dr Garen Lah as scheduled.   [facility] [address] [place], [place] [address] [phone] 08/12/2020 11:37 AM

## 2020-08-26 ENCOUNTER — Other Ambulatory Visit: Payer: BC Managed Care – PPO

## 2020-09-02 ENCOUNTER — Encounter: Payer: BC Managed Care – PPO | Admitting: Family Medicine

## 2020-09-05 ENCOUNTER — Other Ambulatory Visit: Payer: Self-pay

## 2020-09-05 ENCOUNTER — Encounter: Payer: Self-pay | Admitting: Internal Medicine

## 2020-09-05 ENCOUNTER — Telehealth (INDEPENDENT_AMBULATORY_CARE_PROVIDER_SITE_OTHER): Payer: BC Managed Care – PPO | Admitting: Internal Medicine

## 2020-09-05 VITALS — BP 140/88 | HR 130 | Temp 97.5°F | Wt 208.0 lb

## 2020-09-05 DIAGNOSIS — U071 COVID-19: Secondary | ICD-10-CM | POA: Diagnosis not present

## 2020-09-05 DIAGNOSIS — F419 Anxiety disorder, unspecified: Secondary | ICD-10-CM

## 2020-09-05 DIAGNOSIS — F319 Bipolar disorder, unspecified: Secondary | ICD-10-CM

## 2020-09-05 MED ORDER — ARIPIPRAZOLE 10 MG PO TABS: 5.0000 mg | ORAL_TABLET | Freq: Every day | ORAL | 0 refills | Status: DC

## 2020-09-05 MED ORDER — SERTRALINE HCL 100 MG PO TABS: 50.0000 mg | ORAL_TABLET | Freq: Every day | ORAL | 0 refills | Status: DC

## 2020-09-05 NOTE — Progress Notes (Signed)
History of Present Illness:  Pt presents to the clinic today requesting a return to work note. Victoria Williamson reports Victoria Williamson tested positive for Covid 10 days ago. Victoria Williamson currently has no symptoms. Victoria Williamson has had 2 Pfizer vaccines but not her booster.   Victoria Williamson would also like a refill of her Sertraline and Abilify. Victoria Williamson has been on these meds for years and reports they are working well for her. Victoria Williamson has an appt to establish care 11/03/20.   Past Medical History:  Diagnosis Date  . Anxiety   . Arthritis   . Chest pain    normal coronary arteries by cardiac cath oct 29,2014  . Colon polyp   . Depression   . Dysautonomia ([place]) 12/18/2013  . Fatty liver   . Heart disease   . History of blood transfusion   . Hypertension   . PAF (paroxysmal atrial fibrillation) ([place])   . Skin cancer, basal cell   . Thyroid nodule     Current Outpatient Medications  Medication Sig Dispense Refill  . ARIPiprazole (ABILIFY) 10 MG tablet Take 5 mg by mouth daily.    Marland Kitchen ELIQUIS 5 MG TABS tablet Take 1 tablet (5 mg total) by mouth 2 (two) times daily. 60 tablet   . flecainide (TAMBOCOR) 50 MG tablet Take 1 tablet (50 mg total) by mouth 2 (two) times daily. 60 tablet 3  . furosemide (LASIX) 20 MG tablet Take 1 tablet (20 mg total) by mouth daily as needed. 30 tablet   . metoprolol tartrate (LOPRESSOR) 25 MG tablet Take 12.5 mg by mouth 2 (two) times daily.    . mirtazapine (REMERON) 15 MG tablet Take 15 mg by mouth at bedtime.    . sertraline (ZOLOFT) 100 MG tablet Take 50 mg by mouth daily.     No current facility-administered medications for this visit.    Allergies  Allergen Reactions  . Codeine Nausea And Vomiting    Confusion and could not stay awake  . Paroxetine Hcl Other (See Comments)    Sleep all the time    Family History  Problem Relation Age of Onset  . Heart attack Father   . Alcohol abuse Father   . Hearing loss Father   . Heart disease Father   . High Cholesterol Father   . Hypertension Father   .  Heart disease Mother   . Diabetes Mother   . COPD Mother   . Hypertension Mother   . Arthritis Mother   . High Cholesterol Mother   . Kidney disease Mother   . Diabetes Other        family HX, unspecified  . Breast cancer Maternal Aunt 64  . Breast cancer Maternal Grandmother 52  . Depression Paternal Grandmother   . Heart attack Paternal Grandfather   . Heart disease Paternal Grandfather   . Heart attack Brother     Social History   Socioeconomic History  . Marital status: Married    Spouse name: Not on file  . Number of children: Not on file  . Years of education: Not on file  . Highest education level: Not on file  Occupational History  . Not on file  Tobacco Use  . Smoking status: Former Smoker    Types: Cigarettes    Quit date: 12/29/2019    Years since quitting: 0.6  . Smokeless tobacco: Never Used  Vaping Use  . Vaping Use: Every day  Substance and Sexual Activity  . Alcohol use: Not Currently  . Drug  use: Never  . Sexual activity: Yes    Partners: Male  Other Topics Concern  . Not on file  Social History Narrative   Lives at home with husband   Social Determinants of Health   Financial Resource Strain: Not on file  Food Insecurity: Not on file  Transportation Needs: Not on file  Physical Activity: Not on file  Stress: Not on file  Social Connections: Not on file  Intimate Partner Violence: Not on file     Constitutional: Denies fever, malaise, fatigue, headache or abrupt weight changes.  HEENT: Denies eye pain, eye redness, ear pain, ringing in the ears, wax buildup, runny nose, nasal congestion, bloody nose, or sore throat. Respiratory: Denies difficulty breathing, shortness of breath, cough or sputum production.   Cardiovascular: Denies chest pain, chest tightness, palpitations or swelling in the hands or feet.  Gastrointestinal: Denies abdominal pain, bloating, constipation, diarrhea or blood in the stool.  Neurological: Denies dizziness,  difficulty with memory, difficulty with speech or problems with balance and coordination.  Psych: Pt has a history of anxiety and bipolar depression. Denies SI/HI.  No other specific complaints in a complete review of systems (except as listed in HPI above).  Observations/Objective: BP 140/88   Pulse (!) 130   Temp (!) 97.5 F (36.4 C) (Temporal)   Wt 208 lb (94.3 kg)   SpO2 97%   BMI 34.61 kg/m   Wt Readings from Last 3 Encounters:  08/12/20 205 lb 9.6 oz (93.3 kg)  07/15/20 199 lb (90.3 kg)  06/23/20 198 lb 13.7 oz (90.2 kg)    General: Appears her stated age, obese in NAD.Marland Kitchen HEENT: Head: normal shape and size;  Nose: no congestion noted; Throat/Mouth: no hoarseness noted. Pulmonary/Chest: Normal effort. No respiratory distress. Heart: Tachycardic with normal rhythm. Neurological: Alert and oriented.   BMET    Component Value Date/Time   NA 138 06/27/2020 1056   NA 140 05/10/2020 0945   K 3.5 06/27/2020 1056   CL 103 06/27/2020 1056   CO2 26 06/27/2020 1056   GLUCOSE 112 (H) 06/27/2020 1056   BUN 8 06/27/2020 1056   BUN 7 05/10/2020 0945   CREATININE 0.73 06/27/2020 1056   CALCIUM 9.2 06/27/2020 1056   GFRNONAA >60 06/27/2020 1056   GFRAA 112 05/10/2020 0945    Lipid Panel  No results found for: CHOL, TRIG, HDL, CHOLHDL, VLDL, LDLCALC  CBC    Component Value Date/Time   WBC 7.5 06/27/2020 1056   RBC 4.36 06/27/2020 1056   HGB 13.3 06/27/2020 1056   HGB 13.8 05/10/2020 0945   HCT 40.6 06/27/2020 1056   HCT 41.2 05/10/2020 0945   PLT 232 06/27/2020 1056   PLT 217 05/10/2020 0945   MCV 93.1 06/27/2020 1056   MCV 92 05/10/2020 0945   MCH 30.5 06/27/2020 1056   MCHC 32.8 06/27/2020 1056   RDW 12.7 06/27/2020 1056   RDW 12.7 05/10/2020 0945   LYMPHSABS 2.6 06/27/2020 1056   MONOABS 0.7 06/27/2020 1056   EOSABS 0.2 06/27/2020 1056   BASOSABS 0.1 06/27/2020 1056    Hgb A1C No results found for: HGBA1C      Assessment and Plan:  Covid 19:  10  days, now without symptoms Return to work note provided  Anxiety and Bipolar Depression:  Sertraline and Abilify refilled Will monitor  Will see her in March for her new pt appt.  Follow Up Instructions:    I discussed the assessment and treatment plan with the patient. The  patient was provided an opportunity to ask questions and all were answered. The patient agreed with the plan and demonstrated an understanding of the instructions.   The patient was advised to call back or seek an in-person evaluation if the symptoms worsen or if the condition fails to improve as anticipated.    [name], NP

## 2020-09-05 NOTE — Patient Instructions (Signed)
COVID-19 Quarantine vs. Isolation QUARANTINE keeps someone who was in close contact with someone who has COVID-19 away from others. Quarantine if you have been in close contact with someone who has COVID-19, unless you have been fully vaccinated. If you are fully vaccinated  You do NOT need to quarantine unless they have symptoms  Get tested 3-5 days after your exposure, even if you don't have symptoms  Wear a mask indoors in public for 14 days following exposure or until your test result is negative If you are not fully vaccinated  Stay home for 14 days after your last contact with a person who has COVID-19  Watch for fever (100.4F), cough, shortness of breath, or other symptoms of COVID-19  If possible, stay away from people you live with, especially people who are at higher risk for getting very sick from COVID-19  Contact your local public health department for options in your area to possibly shorten your quarantine ISOLATION keeps someone who is sick or tested positive for COVID-19 without symptoms away from others, even in their own home. People who are in isolation should stay home and stay in a specific "sick room" or area and use a separate bathroom (if available). If you are sick and think or know you have COVID-19 Stay home until after  At least 10 days since symptoms first appeared and  At least 24 hours with no fever without the use of fever-reducing medications and  Symptoms have improved If you tested positive for COVID-19 but do not have symptoms  Stay home until after 10 days have passed since your positive viral test  If you develop symptoms after testing positive, follow the steps above for those who are sick cdc.gov/coronavirus 05/02/2020 This information is not intended to replace advice given to you by your health care provider. Make sure you discuss any questions you have with your health care provider. Document Revised: 06/06/2020 Document Reviewed:  06/06/2020 Elsevier Patient Education  2021 [employer].  

## 2020-09-06 ENCOUNTER — Telehealth: Payer: Self-pay | Admitting: Family Medicine

## 2020-09-06 NOTE — Telephone Encounter (Signed)
I called and discussed with patient. Per Danton Clap, it was processed incorrectly, so they refunded the payment. I advised patient that we will submit to insurance and send her the bill for what is owed for her visit yesterday. She verbalized understanding.

## 2020-09-06 NOTE — Telephone Encounter (Signed)
Pt called in she was seen yesterday and she paid the co-pay and she saw that it was taken from her account and then it was put back in her account.Marland Kitchen

## 2020-09-09 ENCOUNTER — Other Ambulatory Visit: Payer: Self-pay | Admitting: Internal Medicine

## 2020-09-09 DIAGNOSIS — Z1231 Encounter for screening mammogram for malignant neoplasm of breast: Secondary | ICD-10-CM

## 2020-09-13 ENCOUNTER — Encounter: Payer: Self-pay | Admitting: Internal Medicine

## 2020-09-23 ENCOUNTER — Ambulatory Visit: Payer: BC Managed Care – PPO | Admitting: Cardiology

## 2020-09-29 ENCOUNTER — Encounter: Payer: BC Managed Care – PPO | Admitting: Internal Medicine

## 2020-09-30 ENCOUNTER — Ambulatory Visit (INDEPENDENT_AMBULATORY_CARE_PROVIDER_SITE_OTHER): Payer: BC Managed Care – PPO | Admitting: Cardiology

## 2020-09-30 ENCOUNTER — Encounter: Payer: Self-pay | Admitting: Cardiology

## 2020-09-30 ENCOUNTER — Other Ambulatory Visit: Payer: Self-pay

## 2020-09-30 VITALS — BP 110/78 | HR 77 | Ht 65.0 in | Wt 208.4 lb

## 2020-09-30 DIAGNOSIS — I10 Essential (primary) hypertension: Secondary | ICD-10-CM

## 2020-09-30 DIAGNOSIS — I480 Paroxysmal atrial fibrillation: Secondary | ICD-10-CM | POA: Diagnosis not present

## 2020-09-30 NOTE — Progress Notes (Signed)
Cardiology Office Note:    Date:  09/30/2020   ID:  Victoria Williamson, DOB February 21, 1966, MRN [id number]  PCP:  Elby Beck, FNP (Inactive)  [facility] Cardiologist:  Kate Sable, MD  [place] Electrophysiologist:  None   Referring MD: Elby Beck, FNP   Chief Complaint  Patient presents with  . Other    3 month f/u c/o dizziness. Meds reviewed verbally with pt.     History of Present Illness:    Victoria Williamson is a 55 y.o. female with a hx of hypertension, paroxysmal atrial fibrillation s/p PVI 07/2020, SVT who presents for follow-up.    Patient being seen due to atrial fibrillation.  Underwent pulmonary vein isolation/ablation 07/2020.  She states feeling much better since her ablation.  Denies palpitations, states having much more energy.  She is happy with the care she obtained from our EP team and everyone involved.  Has occasional dizziness once, but otherwise doing well.   Prior notes  Patient was seen in the emergency room 2 months ago due to palpitations and weakness.  EKG on 03/17/2020 while in the ED showed SVT, heart rate 147.  Blood pressures were low with systolic in the 38G.  Patient given IV fluids with spontaneous conversion of rhythm to sinus rhythm.  She was then discharged home with recommendations for outpatient monitoring.  Review of EMR showed patient had a PET myocardial perfusion stress test 01/2020 with no evidence of ischemia.  Echocardiogram on 12/2019 showed normal systolic function, normal wall thickness, EF 66%, grade 2 diastolic dysfunction.  Had a left heart cath in 2014 and was told she had normal coronary arteries.  Patient states having symptoms of weakness/fatigue, shortness of breath whenever she goes into atrial fibrillation.  Initially diagnosed with atrial fibrillation in February 2021.  She states having the symptoms including shortness of breath and weakness, last evening.   Past Medical History:  Diagnosis  Date  . Anxiety   . Arthritis   . Chest pain    normal coronary arteries by cardiac cath oct 29,2014  . Colon polyp   . Depression   . Dysautonomia ([place]) 12/18/2013  . Fatty liver   . Heart disease   . History of blood transfusion   . Hypertension   . PAF (paroxysmal atrial fibrillation) ([place])   . Skin cancer, basal cell   . Thyroid nodule     Past Surgical History:  Procedure Laterality Date  . ATRIAL FIBRILLATION ABLATION N/A 07/15/2020   Procedure: ATRIAL FIBRILLATION ABLATION;  Surgeon: Vickie Epley, MD;  Location: [place];  Service: Cardiovascular;  Laterality: N/A;  . btl    . CARDIOVERSION N/A 05/11/2020   Procedure: CARDIOVERSION;  Surgeon: Kate Sable, MD;  Location: [facility];  Service: Cardiovascular;  Laterality: N/A;  . CARPAL TUNNEL RELEASE Left 06/23/2020   Procedure: CARPAL TUNNEL RELEASE;  Surgeon: [name], MD;  Location: [place];  Service: Orthopedics;  Laterality: Left;  IV REGIONAL FOREARM BLOCK  . ENDOMETRIAL ABLATION    . right carpal tunel      Current Medications: Current Meds  Medication Sig  . ARIPiprazole (ABILIFY) 10 MG tablet Take 0.5 tablets (5 mg total) by mouth daily.  Marland Kitchen ELIQUIS 5 MG TABS tablet Take 1 tablet (5 mg total) by mouth 2 (two) times daily.  . flecainide (TAMBOCOR) 50 MG tablet Take 1 tablet (50 mg total) by mouth 2 (two) times daily.  . furosemide (LASIX) 20 MG  tablet Take 1 tablet (20 mg total) by mouth daily as needed.  . metoprolol tartrate (LOPRESSOR) 25 MG tablet Take 12.5 mg by mouth 2 (two) times daily.  . mirtazapine (REMERON) 15 MG tablet Take 15 mg by mouth at bedtime.  . sertraline (ZOLOFT) 100 MG tablet Take 0.5 tablets (50 mg total) by mouth daily.     Allergies:   Codeine and Paroxetine hcl   Social History   Socioeconomic History  . Marital status: Married    Spouse name: Not on file  . Number of children: Not on file  . Years of education: Not on file  . Highest  education level: Not on file  Occupational History  . Not on file  Tobacco Use  . Smoking status: Former Smoker    Types: Cigarettes    Quit date: 12/29/2019    Years since quitting: 0.7  . Smokeless tobacco: Never Used  Vaping Use  . Vaping Use: Every day  Substance and Sexual Activity  . Alcohol use: Not Currently  . Drug use: Never  . Sexual activity: Yes    Partners: Male  Other Topics Concern  . Not on file  Social History Narrative   Lives at home with husband   Social Determinants of Health   Financial Resource Strain: Not on file  Food Insecurity: Not on file  Transportation Needs: Not on file  Physical Activity: Not on file  Stress: Not on file  Social Connections: Not on file     Family History: The patient's family history includes Alcohol abuse in her father; Arthritis in her mother; Breast cancer (age of onset: 26) in her maternal aunt; Breast cancer (age of onset: 43) in her maternal grandmother; COPD in her mother; Depression in her paternal grandmother; Diabetes in her mother and another family member; Hearing loss in her father; Heart attack in her brother, father, and paternal grandfather; Heart disease in her father, mother, and paternal grandfather; High Cholesterol in her father and mother; Hypertension in her father and mother; Kidney disease in her mother.  ROS:   Please see the history of present illness.     All other systems reviewed and are negative.  EKGs/Labs/Other Studies Reviewed:    The following studies were reviewed today: Outside echo 12/2019 Summary   1. The left ventricle is normal in size with normal wall thickness.   2. The left ventricular systolic function is normal, LVEF is visually  estimated at > 55%.   3. There is grade II diastolic dysfunction (elevated filling pressure).   4. There is mild aortic regurgitation.   5. The right ventricle is normal in size, with normal systolic function.   6. The aorta at the ascending  aorta is mildly dilated.   PET MPI 01/2020 Impressions:  - Normal myocardial perfusion study  - No evidence for significant ischemia or scar is noted.  - During stress: Global systolic function is normal. The ejection fraction was greater than 65%.  - No significant coronary calcifications were noted on the attenuation CT.  - Incidentally noted on the attenuation CT scan is a dilated ascending aorta (4.3 cm in diameter) onnoncontrasted CT.     EKG:  EKG is  ordered today.  The ekg ordered today demonstrates normal sinus rhythm, normal ECG.  Recent Labs: 12/04/2019: B Natriuretic Peptide 56.0; TSH 6.513 03/17/2020: ALT 97 06/27/2020: BUN 8; Creatinine, Ser 0.73; Hemoglobin 13.3; Platelets 232; Potassium 3.5; Sodium 138  Recent Lipid Panel No results found for: CHOL,  TRIG, HDL, CHOLHDL, VLDL, LDLCALC, LDLDIRECT  Physical Exam:    VS:  BP 110/78 (BP Location: Left Arm, Patient Position: Sitting, Cuff Size: Normal)   Pulse 77   Ht 5\' 5"  (1.651 m)   Wt 208 lb 6 oz (94.5 kg)   SpO2 99%   BMI 34.68 kg/m     Wt Readings from Last 3 Encounters:  09/30/20 208 lb 6 oz (94.5 kg)  09/05/20 208 lb (94.3 kg)  08/12/20 205 lb 9.6 oz (93.3 kg)     GEN:  Well nourished, well developed in no acute distress HEENT: Normal NECK: No JVD; No carotid bruits LYMPHATICS: No lymphadenopathy CARDIAC: Regular rate and rhythm, no murmurs RESPIRATORY:  Clear to auscultation without rales, wheezing or rhonchi  ABDOMEN: Soft, non-tender, non-distended MUSCULOSKELETAL:  No edema; No deformity  SKIN: Warm and dry NEUROLOGIC:  Alert and oriented x 3 PSYCHIATRIC:  Normal affect   ASSESSMENT:    1. Paroxysmal atrial fibrillation (HCC)   2. Essential hypertension    PLAN:    In order of problems listed above:  1. paroxysmal atrial fibrillation, status post PVI/lesion 07/2020 currently in sinus rhythm.  CHA2DS2-VASc of 2. continue Lopressor 12.5 mg twice daily, flecainide 50 mg twice daily.  Eliquis.   If dizziness continues, may consider holding Lopressor. 2. History of hypertension, BP controlled.  Continue Lopressor, as above.  Follow-up in 6 months.  Total encounter time more than 35 minutes  Greater than 50% was spent in counseling and coordination of care with the patient    Medication Adjustments/Labs and Tests Ordered: Current medicines are reviewed at length with the patient today.  Concerns regarding medicines are outlined above.  Orders Placed This Encounter  Procedures  . EKG 12-Lead   No orders of the defined types were placed in this encounter.   Patient Instructions  Medication Instructions:  Your physician recommends that you continue on your current medications as directed. Please refer to the Current Medication list given to you today.  *If you need a refill on your cardiac medications before your next appointment, please call your pharmacy*   Lab Work: None ordered If you have labs (blood work) drawn today and your tests are completely normal, you will receive your results only by: Marland Kitchen MyChart Message (if you have MyChart) OR . A paper copy in the mail If you have any lab test that is abnormal or we need to change your treatment, we will call you to review the results.   Testing/Procedures: None ordered   Follow-Up: At [facility], you and your health needs are our priority.  As part of our continuing mission to provide you with exceptional heart care, we have created designated Provider Care Teams.  These Care Teams include your primary Cardiologist (physician) and Advanced Practice Providers (APPs -  Physician Assistants and Nurse Practitioners) who all work together to provide you with the care you need, when you need it.  We recommend signing up for the patient portal called "MyChart".  Sign up information is provided on this After Visit Summary.  MyChart is used to connect with patients for Virtual Visits (Telemedicine).  Patients are able to view  lab/test results, encounter notes, upcoming appointments, etc.  Non-urgent messages can be sent to your provider as well.   To learn more about what you can do with MyChart, go to [email or web address].    Your next appointment:   6 month(s)  The format for your next appointment:   In Person  Provider:   Kate Sable, MD   Other Instructions      Signed, Kate Sable, MD  09/30/2020 4:22 PM    [place]

## 2020-09-30 NOTE — Patient Instructions (Signed)

## 2020-10-04 ENCOUNTER — Encounter: Payer: Self-pay | Admitting: Internal Medicine

## 2020-10-10 ENCOUNTER — Telehealth: Payer: Self-pay

## 2020-10-10 MED ORDER — METOPROLOL TARTRATE 25 MG PO TABS: 12.5000 mg | ORAL_TABLET | Freq: Two times a day (BID) | ORAL | 0 refills | Status: DC

## 2020-10-10 NOTE — Telephone Encounter (Signed)
Pharmacy requests refill on: Metoprolol Succinate ER 25 mg   LAST REFILL: Unknown  LAST OV: 09/05/2020 NEXT OV: 11/03/2020 PHARMACY: [id number] [place], [place]

## 2020-10-14 ENCOUNTER — Other Ambulatory Visit: Payer: Self-pay

## 2020-10-14 ENCOUNTER — Ambulatory Visit
Admission: RE | Admit: 2020-10-14 | Discharge: 2020-10-14 | Disposition: A | Discharge status: Home / Self Care | Payer: BC Managed Care – PPO | Source: Ambulatory Visit | Attending: Internal Medicine | Admitting: Internal Medicine

## 2020-10-14 DIAGNOSIS — Z1231 Encounter for screening mammogram for malignant neoplasm of breast: Secondary | ICD-10-CM | POA: Diagnosis present

## 2020-10-26 ENCOUNTER — Other Ambulatory Visit: Payer: Self-pay

## 2020-10-26 ENCOUNTER — Ambulatory Visit (INDEPENDENT_AMBULATORY_CARE_PROVIDER_SITE_OTHER): Payer: BC Managed Care – PPO | Admitting: Cardiology

## 2020-10-26 ENCOUNTER — Encounter: Payer: Self-pay | Admitting: Cardiology

## 2020-10-26 VITALS — BP 130/80 | HR 78 | Ht 65.0 in | Wt 206.6 lb

## 2020-10-26 DIAGNOSIS — I10 Essential (primary) hypertension: Secondary | ICD-10-CM | POA: Diagnosis not present

## 2020-10-26 DIAGNOSIS — I480 Paroxysmal atrial fibrillation: Secondary | ICD-10-CM

## 2020-10-26 MED ORDER — METOPROLOL SUCCINATE ER 25 MG PO TB24: 25.0000 mg | ORAL_TABLET | Freq: Every day | ORAL | 3 refills | Status: DC

## 2020-10-26 NOTE — Progress Notes (Signed)
Electrophysiology Office Follow up Visit Note:    Date:  10/26/2020   ID:  [place] Vonita Moss, DOB 02-27-1966, MRN [id number]  PCP:  Elby Beck, FNP (Inactive)  [facility] Cardiologist:  Kate Sable, MD  [facility] Electrophysiologist:  Vickie Epley, MD    Interval History:    Victoria Williamson is a 55 y.o. female who presents for a follow up visit after an atrial fibrillation ablation on July 15, 2020.  Since that time she has been doing very well without recurrence of her arrhythmia.  She has been taking her Eliquis uninterrupted since the ablation without bleeding issues.  She also continues to take metoprolol and flecainide.  She is very interested in stopping her anticoagulant.     Past Medical History:  Diagnosis Date  . Anxiety   . Arthritis   . Chest pain    normal coronary arteries by cardiac cath oct 29,2014  . Colon polyp   . Depression   . Dysautonomia ([place]) 12/18/2013  . Fatty liver   . Heart disease   . History of blood transfusion   . Hypertension   . PAF (paroxysmal atrial fibrillation) ([place])   . Skin cancer, basal cell   . Thyroid nodule     Past Surgical History:  Procedure Laterality Date  . ATRIAL FIBRILLATION ABLATION N/A 07/15/2020   Procedure: ATRIAL FIBRILLATION ABLATION;  Surgeon: Vickie Epley, MD;  Location: [place] [facility];  Service: Cardiovascular;  Laterality: N/A;  . btl    . CARDIOVERSION N/A 05/11/2020   Procedure: CARDIOVERSION;  Surgeon: Kate Sable, MD;  Location: [facility];  Service: Cardiovascular;  Laterality: N/A;  . CARPAL TUNNEL RELEASE Left 06/23/2020   Procedure: CARPAL TUNNEL RELEASE;  Surgeon: [name], MD;  Location: [place];  Service: Orthopedics;  Laterality: Left;  IV REGIONAL FOREARM BLOCK  . ENDOMETRIAL ABLATION    . right carpal tunel      Current Medications: Current Meds  Medication Sig  . ARIPiprazole (ABILIFY) 10 MG tablet Take 0.5 tablets (5  mg total) by mouth daily.  . furosemide (LASIX) 20 MG tablet Take 1 tablet (20 mg total) by mouth daily as needed.  . metoprolol succinate (TOPROL XL) 25 MG 24 hr tablet Take 1 tablet (25 mg total) by mouth daily.  . mirtazapine (REMERON) 15 MG tablet Take 15 mg by mouth at bedtime.  . sertraline (ZOLOFT) 100 MG tablet Take 0.5 tablets (50 mg total) by mouth daily.  . [DISCONTINUED] ELIQUIS 5 MG TABS tablet Take 1 tablet (5 mg total) by mouth 2 (two) times daily.  . [DISCONTINUED] flecainide (TAMBOCOR) 50 MG tablet Take 1 tablet (50 mg total) by mouth 2 (two) times daily.  . [DISCONTINUED] metoprolol tartrate (LOPRESSOR) 25 MG tablet Take 0.5 tablets (12.5 mg total) by mouth 2 (two) times daily.     Allergies:   Codeine and Paroxetine hcl   Social History   Socioeconomic History  . Marital status: Married    Spouse name: Not on file  . Number of children: Not on file  . Years of education: Not on file  . Highest education level: Not on file  Occupational History  . Not on file  Tobacco Use  . Smoking status: Former Smoker    Types: Cigarettes    Quit date: 12/29/2019    Years since quitting: 0.8  . Smokeless tobacco: Never Used  Vaping Use  . Vaping Use: Every day  Substance and Sexual Activity  .  Alcohol use: Not Currently  . Drug use: Never  . Sexual activity: Yes    Partners: Male  Other Topics Concern  . Not on file  Social History Narrative   Lives at home with husband   Social Determinants of Health   Financial Resource Strain: Not on file  Food Insecurity: Not on file  Transportation Needs: Not on file  Physical Activity: Not on file  Stress: Not on file  Social Connections: Not on file     Family History: The patient's family history includes Alcohol abuse in her father; Arthritis in her mother; Breast cancer (age of onset: 62) in her maternal aunt; Breast cancer (age of onset: 27) in her maternal grandmother; COPD in her mother; Depression in her paternal  grandmother; Diabetes in her mother and another family member; Hearing loss in her father; Heart attack in her brother, father, and paternal grandfather; Heart disease in her father, mother, and paternal grandfather; High Cholesterol in her father and mother; Hypertension in her father and mother; Kidney disease in her mother.  ROS:   Please see the history of present illness.    All other systems reviewed and are negative.  EKGs/Labs/Other Studies Reviewed:    The following studies were reviewed today:   EKG:  The ekg ordered today demonstrates sinus rhythm.  Recent Labs: 12/04/2019: B Natriuretic Peptide 56.0; TSH 6.513 03/17/2020: ALT 97 06/27/2020: BUN 8; Creatinine, Ser 0.73; Hemoglobin 13.3; Platelets 232; Potassium 3.5; Sodium 138  Recent Lipid Panel No results found for: CHOL, TRIG, HDL, CHOLHDL, VLDL, LDLCALC, LDLDIRECT  Physical Exam:    VS:  BP 130/80   Pulse 78   Ht 5\' 5"  (1.651 m)   Wt 206 lb 9.6 oz (93.7 kg)   SpO2 98%   BMI 34.38 kg/m     Wt Readings from Last 3 Encounters:  10/26/20 206 lb 9.6 oz (93.7 kg)  09/30/20 208 lb 6 oz (94.5 kg)  09/05/20 208 lb (94.3 kg)     GEN:  Well nourished, well developed in no acute distress HEENT: Normal NECK: No JVD; No carotid bruits LYMPHATICS: No lymphadenopathy CARDIAC: RRR, no murmurs, rubs, gallops RESPIRATORY:  Clear to auscultation without rales, wheezing or rhonchi  ABDOMEN: Soft, non-tender, non-distended MUSCULOSKELETAL:  No edema; No deformity  SKIN: Warm and dry NEUROLOGIC:  Alert and oriented x 3 PSYCHIATRIC:  Normal affect   ASSESSMENT:    1. Paroxysmal atrial fibrillation (HCC)   2. Essential hypertension    PLAN:    In order of problems listed above:  1. Paroxysmal atrial fibrillation Maintaining sinus rhythm after an ablation in December 2021.  She has been taking Eliquis for stroke prophylaxis but is interested in stopping it if possible.  We discussed the risk of stroke and after a  successful atrial fibrillation ablation.  She has a CHA2DS2-VASc of 1 for gender.  She has a diagnosis of hypertension but she is not on any antihypertensive medication and is normotensive on exam today.  I think it is reasonable to discontinue her anticoagulation at this time.  We discussed the risk of stroke associated with her diagnosis of atrial fibrillation.  I have recommended that she use some form of ongoing rhythm monitoring with the device so she was alivecor cardia mobile.  She will plan to use this type of technology to take periodic heart rhythm recordings.  She will let us know if she develops atrial fibrillation symptoms.  I will plan on seeing her back in 1 year.  In the meantime, she can stop taking her flecainide.  I will also transition her metoprolol to the long-acting formulation.  2.  Hypertension Reported diagnosis but is normotensive on exam today without an antihypertensive on board.  Follow-up 1 year.    Medication Adjustments/Labs and Tests Ordered: Current medicines are reviewed at length with the patient today.  Concerns regarding medicines are outlined above.  Orders Placed This Encounter  Procedures  . EKG 12-Lead   Meds ordered this encounter  Medications  . metoprolol succinate (TOPROL XL) 25 MG 24 hr tablet    Sig: Take 1 tablet (25 mg total) by mouth daily.    Dispense:  90 tablet    Refill:  3     Signed, Lars Mage, MD, [facility]  10/26/2020 7:57 PM    Electrophysiology [place]

## 2020-10-26 NOTE — Patient Instructions (Addendum)
Medication Instructions:  Your physician has recommended you make the following change in your medication:   1.  STOP flecainide  2.  STOP Eliquis  3.  STOP metoprolol tartrate  4.  START taking metoprolol succinate 25 mg-  Take one tablet by mouth daily  Check your heart rhythm with your home monitor 3-4 times a week  *If you need a refill on your cardiac medications before your next appointment, please call your pharmacy*  Lab Work: None ordered. If you have labs (blood work) drawn today and your tests are completely normal, you will receive your results only by: Marland Kitchen MyChart Message (if you have MyChart) OR . A paper copy in the mail If you have any lab test that is abnormal or we need to change your treatment, we will call you to review the results.  Testing/Procedures: None ordered.  Follow-Up: At [facility], you and your health needs are our priority.  As part of our continuing mission to provide you with exceptional heart care, we have created designated Provider Care Teams.  These Care Teams include your primary Cardiologist (physician) and Advanced Practice Providers (APPs -  Physician Assistants and Nurse Practitioners) who all work together to provide you with the care you need, when you need it.  Your next appointment:   Your physician wants you to follow-up in: 9 months with Dr. Quentin Ore.   You will receive a reminder letter in the mail two months in advance. If you don't receive a letter, please call our office to schedule the follow-up appointment.

## 2020-11-02 ENCOUNTER — Ambulatory Visit (INDEPENDENT_AMBULATORY_CARE_PROVIDER_SITE_OTHER): Payer: BC Managed Care – PPO

## 2020-11-02 ENCOUNTER — Telehealth: Payer: Self-pay

## 2020-11-02 DIAGNOSIS — I480 Paroxysmal atrial fibrillation: Secondary | ICD-10-CM

## 2020-11-02 MED ORDER — METOPROLOL SUCCINATE ER 50 MG PO TB24: 50.0000 mg | ORAL_TABLET | Freq: Every day | ORAL | 3 refills | Status: DC

## 2020-11-02 MED ORDER — APIXABAN 5 MG PO TABS: 5.0000 mg | ORAL_TABLET | Freq: Two times a day (BID) | ORAL | 11 refills | Status: DC

## 2020-11-02 NOTE — Telephone Encounter (Signed)
14 day monitor ordered to ship to Pt.  Order entered for Eliquis (resume) and increase Toprol XL to 50 mg daily.  Advised do NOT restart flecainide.  Will have scheduling set up 3 month f/u.  See mychart message.

## 2020-11-03 ENCOUNTER — Encounter: Payer: BC Managed Care – PPO | Admitting: Internal Medicine

## 2020-11-06 DIAGNOSIS — I480 Paroxysmal atrial fibrillation: Secondary | ICD-10-CM | POA: Diagnosis not present

## 2020-11-10 LAB — RESULTS CONSOLE HPV: CHL HPV: NEGATIVE

## 2020-11-10 LAB — BASIC METABOLIC PANEL
BUN: 11 (ref 4–21)
CO2: 25 — AB (ref 13–22)
Chloride: 105 (ref 99–108)
Creatinine: 0.9 (ref 0.5–1.1)
Glucose: 80
Potassium: 3.8 (ref 3.4–5.3)
Sodium: 143 (ref 137–147)

## 2020-11-10 LAB — LIPID PANEL
Cholesterol: 161 (ref 0–200)
HDL: 51 (ref 35–70)
LDL Cholesterol: 93
LDl/HDL Ratio: 1.8
Triglycerides: 83 (ref 40–160)

## 2020-11-10 LAB — COMPREHENSIVE METABOLIC PANEL
Albumin: 4.2 (ref 3.5–5.0)
Calcium: 9.5 (ref 8.7–10.7)
Globulin: 3.1

## 2020-11-10 LAB — CBC AND DIFFERENTIAL
HCT: 41 (ref 36–46)
Hemoglobin: 12.8 (ref 12.0–16.0)
Platelets: 200 (ref 150–399)
WBC: 6.6

## 2020-11-10 LAB — HM PAP SMEAR: HM Pap smear: NEGATIVE

## 2020-11-10 LAB — VITAMIN D 25 HYDROXY (VIT D DEFICIENCY, FRACTURES): Vit D, 25-Hydroxy: 12.8

## 2020-11-10 LAB — T3
T3 Uptake: 44.2
T3, Total: 159

## 2020-11-10 LAB — T4,TOTAL(THYROXINE)IMMUNOASSAY: T4, Total: 12.32

## 2020-11-10 LAB — TSH: TSH: 4.13 (ref 0.41–5.90)

## 2020-11-10 LAB — HEMOGLOBIN A1C: Hemoglobin A1C: 5.3

## 2020-11-10 LAB — CBC: RBC: 4.3 (ref 3.87–5.11)

## 2020-11-10 LAB — T4, FREE: Free T4: 0.71

## 2020-11-15 ENCOUNTER — Telehealth: Payer: Self-pay | Admitting: Cardiology

## 2020-11-15 NOTE — Telephone Encounter (Signed)
Mychart message sent.  Pt scheduled for loop implant on 11/23/20 with Dr. Quentin Ore.  Pt is mailing back her zio monitor tomorrow.  Sent to precert

## 2020-11-15 NOTE — Telephone Encounter (Signed)
  Patient c/o Palpitations:  High priority if patient c/o lightheadedness, shortness of breath, or chest pain  1) How long have you had palpitations/irregular HR/ Afib? Are you having the symptoms now? Not now, last night about 11p and lasted about 45 minutes   2) Are you currently experiencing lightheadedness, SOB or CP? no  3) Do you have a history of afib (atrial fibrillation) or irregular heart rhythm? yes  4) Have you checked your BP or HR? (document readings if available): 147/99 HR 137  5) Are you experiencing any other symptoms? No  Patient calling - states that ZIO heart monitor fell off, wore it for about a week.  Would like to discuss getting a monitor placed under skin that can monitor for afib.  Please call to discuss.

## 2020-11-23 ENCOUNTER — Ambulatory Visit (INDEPENDENT_AMBULATORY_CARE_PROVIDER_SITE_OTHER): Payer: BC Managed Care – PPO | Admitting: Cardiology

## 2020-11-23 ENCOUNTER — Ambulatory Visit: Payer: BC Managed Care – PPO | Admitting: Cardiology

## 2020-11-23 ENCOUNTER — Other Ambulatory Visit: Payer: Self-pay

## 2020-11-23 ENCOUNTER — Encounter: Payer: Self-pay | Admitting: Cardiology

## 2020-11-23 VITALS — BP 126/82 | HR 75 | Ht 65.0 in | Wt 206.0 lb

## 2020-11-23 DIAGNOSIS — I10 Essential (primary) hypertension: Secondary | ICD-10-CM

## 2020-11-23 DIAGNOSIS — I480 Paroxysmal atrial fibrillation: Secondary | ICD-10-CM | POA: Diagnosis not present

## 2020-11-23 DIAGNOSIS — I471 Supraventricular tachycardia: Secondary | ICD-10-CM

## 2020-11-23 MED ORDER — METOPROLOL SUCCINATE ER 100 MG PO TB24: 100.0000 mg | ORAL_TABLET | Freq: Every day | ORAL | 3 refills | Status: DC

## 2020-11-23 NOTE — Progress Notes (Signed)
Electrophysiology Office Follow up Visit Note:    Date:  11/23/2020   ID:  Victoria Williamson, DOB 12-04-1965, MRN [id number]  PCP:  Elby Beck, FNP (Inactive)  [facility] Cardiologist:  Kate Sable, MD  [facility] Electrophysiologist:  Vickie Epley, MD    Interval History:    Victoria Williamson is a 55 y.o. female who presents for a follow up visit.  She has a history of paroxysmal atrial fibrillation and flutter and underwent a successful A. fib ablation in December 2021.  She was previously on flecainide but this caused very dry mouth and trouble sleeping so was discontinued.  She continues to take Eliquis for stroke prophylaxis.  She recently developed palpitations that last for minutes to a little over an hour at a time.  There were rare and without clear triggers.  She describes them as "almost trying to be in "atrial fibrillation but never "turns into atrial fibrillation".  She is checked her heart rate during these episodes using her blood pressure cuff and it is in the 120-130 range.  Her previous episodes of atrial fibrillation had much faster heart rates.      Past Medical History:  Diagnosis Date  . Anxiety   . Arthritis   . Chest pain    normal coronary arteries by cardiac cath oct 29,2014  . Colon polyp   . Depression   . Dysautonomia ([place]) 12/18/2013  . Fatty liver   . Heart disease   . History of blood transfusion   . Hypertension   . PAF (paroxysmal atrial fibrillation) ([place])   . Skin cancer, basal cell   . Thyroid nodule     Past Surgical History:  Procedure Laterality Date  . ATRIAL FIBRILLATION ABLATION N/A 07/15/2020   Procedure: ATRIAL FIBRILLATION ABLATION;  Surgeon: Vickie Epley, MD;  Location: [place] CV [facility];  Service: Cardiovascular;  Laterality: N/A;  . btl    . CARDIOVERSION N/A 05/11/2020   Procedure: CARDIOVERSION;  Surgeon: Kate Sable, MD;  Location: [facility] ORS;  Service: Cardiovascular;  Laterality:  N/A;  . CARPAL TUNNEL RELEASE Left 06/23/2020   Procedure: CARPAL TUNNEL RELEASE;  Surgeon: [name], MD;  Location: [place];  Service: Orthopedics;  Laterality: Left;  IV REGIONAL FOREARM BLOCK  . ENDOMETRIAL ABLATION    . right carpal tunel      Current Medications: Current Meds  Medication Sig  . apixaban (ELIQUIS) 5 MG TABS tablet Take 1 tablet (5 mg total) by mouth 2 (two) times daily.  . ARIPiprazole (ABILIFY) 10 MG tablet Take 0.5 tablets (5 mg total) by mouth daily.  . Cholecalciferol (CVS VIT D 5000 HIGH-POTENCY PO) Take by mouth daily in the afternoon.  . furosemide (LASIX) 20 MG tablet Take 1 tablet (20 mg total) by mouth daily as needed.  . metoprolol succinate (TOPROL-XL) 100 MG 24 hr tablet Take 1 tablet (100 mg total) by mouth daily. Take with or immediately following a meal.  . mirtazapine (REMERON) 15 MG tablet Take 15 mg by mouth at bedtime.  . pantoprazole sodium (PROTONIX) 40 mg/20 mL PACK daily.  . [DISCONTINUED] metoprolol succinate (TOPROL-XL) 50 MG 24 hr tablet Take 1 tablet (50 mg total) by mouth daily. Take with or immediately following a meal.     Allergies:   Codeine and Paroxetine hcl   Social History   Socioeconomic History  . Marital status: Married    Spouse name: Not on file  . Number of children: Not  on file  . Years of education: Not on file  . Highest education level: Not on file  Occupational History  . Not on file  Tobacco Use  . Smoking status: Former Smoker    Types: Cigarettes    Quit date: 12/29/2019    Years since quitting: 0.9  . Smokeless tobacco: Never Used  Vaping Use  . Vaping Use: Every day  Substance and Sexual Activity  . Alcohol use: Not Currently  . Drug use: Never  . Sexual activity: Yes    Partners: Male  Other Topics Concern  . Not on file  Social History Narrative   Lives at home with husband   Social Determinants of Health   Financial Resource Strain: Not on file  Food Insecurity: Not on  file  Transportation Needs: Not on file  Physical Activity: Not on file  Stress: Not on file  Social Connections: Not on file     Family History: The patient's family history includes Alcohol abuse in her father; Arthritis in her mother; Breast cancer (age of onset: 58) in her maternal aunt; Breast cancer (age of onset: 20) in her maternal grandmother; COPD in her mother; Depression in her paternal grandmother; Diabetes in her mother and another family member; Hearing loss in her father; Heart attack in her brother, father, and paternal grandfather; Heart disease in her father, mother, and paternal grandfather; High Cholesterol in her father and mother; Hypertension in her father and mother; Kidney disease in her mother.  ROS:   Please see the history of present illness.    All other systems reviewed and are negative.  EKGs/Labs/Other Studies Reviewed:    The following studies were reviewed today:  November 21, 2020 ZIO personally reviewed HR 54-190bpm, average 79bpm. 7 episodes of SVT, longest lasting 1h3min with an average rate of 128bpm. Review of rhythm strip shows P wave preceding each QRS suggestive of AT. No evidence of atrial fibrillation.   EKG:  The ekg ordered today demonstrates sinus rhythm.    Recent Labs: 12/04/2019: B Natriuretic Peptide 56.0; TSH 6.513 03/17/2020: ALT 97 06/27/2020: BUN 8; Creatinine, Ser 0.73; Hemoglobin 13.3; Platelets 232; Potassium 3.5; Sodium 138  Recent Lipid Panel No results found for: CHOL, TRIG, HDL, CHOLHDL, VLDL, LDLCALC, LDLDIRECT  Physical Exam:    VS:  BP 126/82   Pulse 75   Ht 5\' 5"  (1.651 m)   Wt 206 lb (93.4 kg)   BMI 34.28 kg/m     Wt Readings from Last 3 Encounters:  11/23/20 206 lb (93.4 kg)  10/26/20 206 lb 9.6 oz (93.7 kg)  09/30/20 208 lb 6 oz (94.5 kg)     GEN:  Well nourished, well developed in no acute distress HEENT: Normal NECK: No JVD; No carotid bruits LYMPHATICS: No lymphadenopathy CARDIAC: RRR, no  murmurs, rubs, gallops RESPIRATORY:  Clear to auscultation without rales, wheezing or rhonchi  ABDOMEN: Soft, non-tender, non-distended MUSCULOSKELETAL:  No edema; No deformity  SKIN: Warm and dry NEUROLOGIC:  Alert and oriented x 3 PSYCHIATRIC:  Normal affect   ASSESSMENT:    1. Paroxysmal atrial fibrillation (HCC)   2. SVT (supraventricular tachycardia) ([place])   3. Essential hypertension    PLAN:    In order of problems listed above:  1. Palpitations Relatively recent complaint of palpitations with heart rates in the 120s to 130s are consistent with a diagnosis of SVT.  She is worn a heart monitor which did capture these episodes.  There is a clear P wave preceding  each QRS which likely represents an atrial tachycardia.  There is no evidence of atrial fibrillation or flutter.  We discussed management strategies for this atrial tachycardia including continuing to use her metoprolol versus initiating an antiarrhythmic drug such as propafenone (prior intolerance to flecainide) versus EP study with possible ablation of atrial tachycardia versus other SVT.  We discussed the pros and cons of each option.  For now she would like to continue with her metoprolol to see if she can get it under control with an increased dose.  We will start with metoprolol succinate 100 mg by mouth once daily for the next week.  If she continues to have breakthrough episodes she will increase it to 150 mg by mouth once daily.  We will plan to touch base in about 4 to 6 weeks to see how she is doing.  If she continues to have breakthrough episodes on the increased dose of Toprol-XL, we will plan to either initiate propafenone or take back to the lab for repeat EP study.  We discussed the possibility that if we ultimately did take back to the EP lab that we would not be able to induce the tachycardia.  2.  Paroxysmal atrial fibrillation Post ablation on July 15, 2020.  Has not had a recurrence of atrial  fibrillation. She continues to take her Eliquis with which I think is reasonable.  If we can get her SVT under control, can likely discontinue the Eliquis.  We discussed this during today's visit.  We will revisit this topic at her next appointment.  3.  Hypertension Controlled.  Medication Adjustments/Labs and Tests Ordered: Current medicines are reviewed at length with the patient today.  Concerns regarding medicines are outlined above.  Orders Placed This Encounter  Procedures  . EKG 12-Lead   Meds ordered this encounter  Medications  . metoprolol succinate (TOPROL-XL) 100 MG 24 hr tablet    Sig: Take 1 tablet (100 mg total) by mouth daily. Take with or immediately following a meal.    Dispense:  90 tablet    Refill:  3     Signed, Lars Mage, MD, [facility], [facility] 11/23/2020 11:18 AM    Electrophysiology [facility]

## 2020-11-23 NOTE — Patient Instructions (Addendum)
Medication Instructions:  Your physician has recommended you make the following change in your medication:   1.  INCREASE your metoprolol succinate-  Take 100 mg by mouth daily (you may take 2 tablets of your 50 mg if your would like)  If you are still having palpitations after 7 days we will increase your metoprolol to 150 mg by mouth daily-please shoot me a MyChart message to let me know how you are doing after 7 days on the metoprolol 100 mg.   *If you need a refill on your cardiac medications before your next appointment, please call your pharmacy*  Lab Work: None ordered. If you have labs (blood work) drawn today and your tests are completely normal, you will receive your results only by: Marland Kitchen MyChart Message (if you have MyChart) OR . A paper copy in the mail If you have any lab test that is abnormal or we need to change your treatment, we will call you to review the results.  Testing/Procedures: None ordered.  Follow-Up: At [facility], you and your health needs are our priority.  As part of our continuing mission to provide you with exceptional heart care, we have created designated Provider Care Teams.  These Care Teams include your primary Cardiologist (physician) and Advanced Practice Providers (APPs -  Physician Assistants and Nurse Practitioners) who all work together to provide you with the care you need, when you need it.  Your next appointment:   Your physician wants you to follow-up in: 4-6 weeks with Dr. Quentin Ore.

## 2020-11-29 ENCOUNTER — Ambulatory Visit: Payer: BC Managed Care – PPO | Admitting: Adult Health

## 2020-12-14 ENCOUNTER — Other Ambulatory Visit: Payer: Self-pay | Admitting: Unknown Physician Specialty

## 2020-12-14 DIAGNOSIS — E041 Nontoxic single thyroid nodule: Secondary | ICD-10-CM

## 2020-12-28 ENCOUNTER — Ambulatory Visit: Payer: BC Managed Care – PPO | Admitting: Cardiology

## 2021-01-06 ENCOUNTER — Ambulatory Visit
Admission: RE | Admit: 2021-01-06 | Discharge: 2021-01-06 | Disposition: A | Discharge status: Home / Self Care | Payer: BC Managed Care – PPO | Source: Ambulatory Visit | Attending: Unknown Physician Specialty | Admitting: Unknown Physician Specialty

## 2021-01-06 ENCOUNTER — Other Ambulatory Visit: Payer: Self-pay

## 2021-01-06 DIAGNOSIS — E041 Nontoxic single thyroid nodule: Secondary | ICD-10-CM | POA: Insufficient documentation

## 2021-01-11 ENCOUNTER — Other Ambulatory Visit: Payer: Self-pay | Admitting: Internal Medicine

## 2021-02-15 ENCOUNTER — Ambulatory Visit: Payer: BC Managed Care – PPO | Admitting: Cardiology

## 2021-03-17 ENCOUNTER — Other Ambulatory Visit: Payer: Self-pay

## 2021-03-17 ENCOUNTER — Encounter: Payer: Self-pay | Admitting: Nurse Practitioner

## 2021-03-17 ENCOUNTER — Ambulatory Visit: Payer: BC Managed Care – PPO | Admitting: Nurse Practitioner

## 2021-03-17 VITALS — BP 134/72 | HR 64 | Temp 97.8°F | Resp 18 | Ht 65.0 in | Wt 218.2 lb

## 2021-03-17 DIAGNOSIS — F319 Bipolar disorder, unspecified: Secondary | ICD-10-CM | POA: Insufficient documentation

## 2021-03-17 DIAGNOSIS — Z0000 Encounter for general adult medical examination without abnormal findings: Secondary | ICD-10-CM | POA: Insufficient documentation

## 2021-03-17 DIAGNOSIS — I10 Essential (primary) hypertension: Secondary | ICD-10-CM

## 2021-03-17 DIAGNOSIS — K219 Gastro-esophageal reflux disease without esophagitis: Secondary | ICD-10-CM

## 2021-03-17 DIAGNOSIS — I480 Paroxysmal atrial fibrillation: Secondary | ICD-10-CM

## 2021-03-17 DIAGNOSIS — Z7689 Persons encountering health services in other specified circumstances: Secondary | ICD-10-CM

## 2021-03-17 DIAGNOSIS — E559 Vitamin D deficiency, unspecified: Secondary | ICD-10-CM

## 2021-03-17 DIAGNOSIS — E669 Obesity, unspecified: Secondary | ICD-10-CM | POA: Insufficient documentation

## 2021-03-17 DIAGNOSIS — Z6835 Body mass index (BMI) 35.0-35.9, adult: Secondary | ICD-10-CM

## 2021-03-17 DIAGNOSIS — K760 Fatty (change of) liver, not elsewhere classified: Secondary | ICD-10-CM

## 2021-03-17 DIAGNOSIS — L989 Disorder of the skin and subcutaneous tissue, unspecified: Secondary | ICD-10-CM

## 2021-03-17 DIAGNOSIS — Z122 Encounter for screening for malignant neoplasm of respiratory organs: Secondary | ICD-10-CM

## 2021-03-17 DIAGNOSIS — E66812 Obesity, class 2: Secondary | ICD-10-CM

## 2021-03-17 NOTE — Assessment & Plan Note (Signed)
Reviewed available electronic medical records.  Did have release signed in office to get previous PCPs information.  Pending records

## 2021-03-17 NOTE — Assessment & Plan Note (Signed)
Did discuss diet modifications with patient including trying to eliminate excess sugar such as cakes and sodas.  Patient does have a treadmill at home states approximately 2 times a week she will walk on it for approximately 20 to 30 minutes at a time.  Encouraged gentle ramp-up target goal of 5 times a week 30 minutes each.  Check lipids and A1c in office today.  Pending lab results.

## 2021-03-17 NOTE — Patient Instructions (Signed)
Pleasure meeting you today Will be in touch with the lab results Follow up in approx 6 months, sooner If needed

## 2021-03-17 NOTE — Progress Notes (Signed)
Established Patient Office Visit  Subjective:  Patient ID: Victoria Williamson, female    DOB: [birth year]  Age: 55 y.o. MRN: [id number]  CC:  Chief Complaint  Patient presents with   Transfer of Care    Use to see Victoria Williamson and Victoria Williamson. Then went to [facility] but did not like the care she received and wanted to come back to our office.   Skin Tag    Vaginal area. X 1 year. Previous provider prescribed topical creams but they were not covered by insurance.    HPI Victoria Williamson presents for [facility]  A-Fib: On anticoagulation and underwent ablation in 07/2020 that has corrected the atrial fib. Patient states since then she has experienced SVT. She is followed by Cardiology and interventional Cardiology.  GERD: maintained on Protonix for approx 1 year. Does noticed that if she eats late at night she can experience. Tries to avoid eating at bedtime  Bipolar Depression: currently maintained on Abilify and Remeron. Was on Zoloft at one point but took herself off and feels fine per her report. Administered PHQ-9 in office.  Skin Tag: has been there for approx 1 year. States previous PCP prescribed cream that was too expensive. She states that it has grown and now bothersome. Would like referral to have it managed.   Past Medical History:  Diagnosis Date   Anxiety    Arthritis    Chest pain    normal coronary arteries by cardiac cath oct 29,2014   Colon polyp    Depression    Dysautonomia ([place]) 12/18/2013   Fatty liver    Heart disease    History of blood transfusion    Hypertension    PAF (paroxysmal atrial fibrillation) ([place])    Skin cancer, basal cell    Thyroid nodule     Past Surgical History:  Procedure Laterality Date   ATRIAL FIBRILLATION ABLATION N/A 07/15/2020   Procedure: ATRIAL FIBRILLATION ABLATION;  Surgeon: Vickie Epley, MD;  Location: [place] CV LAB;  Service: Cardiovascular;  Laterality: N/A;   btl     CARDIOVERSION N/A 05/11/2020    Procedure: CARDIOVERSION;  Surgeon: Kate Sable, MD;  Location: [place] OR[facility];  Service: Cardiovascular;  Laterality: N/A;   CARPAL TUNNEL RELEASE Left 06/23/2020   Procedure: CARPAL TUNNEL RELEASE;  Surgeon: [name], MD;  Location: [place];  Service: Orthopedics;  Laterality: Left;  IV REGIONAL FOREARM BLOCK   ENDOMETRIAL ABLATION     right carpal tunel      Family History  Problem Relation Age of Onset   Heart attack Father    Alcohol abuse Father    Hearing loss Father    Heart disease Father    High Cholesterol Father    Hypertension Father    Heart disease Mother    Diabetes Mother    COPD Mother    Hypertension Mother    Arthritis Mother    High Cholesterol Mother    Kidney disease Mother    Diabetes Other        family HX, unspecified   Breast cancer Maternal Aunt 45   Breast cancer Maternal Grandmother 92   Depression Paternal Grandmother    Heart attack Paternal Grandfather    Heart disease Paternal Grandfather    Heart attack Brother     Social History   Socioeconomic History   Marital status: Married    Spouse name: Not on file   Number of children: Not on file  Years of education: Not on file   Highest education level: Not on file  Occupational History   Not on file  Tobacco Use   Smoking status: Former    Types: Cigarettes    Quit date: 12/29/2019    Years since quitting: 1.2   Smokeless tobacco: Never  Vaping Use   Vaping Use: Every day  Substance and Sexual Activity   Alcohol use: Not Currently   Drug use: Never   Sexual activity: Yes    Partners: Male  Other Topics Concern   Not on file  Social History Narrative   Lives at home with husband   Social Determinants of Health   Financial Resource Strain: Not on file  Food Insecurity: Not on file  Transportation Needs: Not on file  Physical Activity: Not on file  Stress: Not on file  Social Connections: Not on file  Intimate Partner Violence: Not on file     Outpatient Medications Prior to Visit  Medication Sig Dispense Refill   apixaban (ELIQUIS) 5 MG TABS tablet Take 1 tablet (5 mg total) by mouth 2 (two) times daily. 60 tablet 11   ARIPiprazole (ABILIFY) 10 MG tablet Take 0.5 tablets (5 mg total) by mouth daily. 90 tablet 0   Cholecalciferol (CVS VIT D 5000 HIGH-POTENCY PO) Take by mouth daily in the afternoon.     furosemide (LASIX) 20 MG tablet Take 1 tablet (20 mg total) by mouth daily as needed. 30 tablet    metoprolol succinate (TOPROL-XL) 100 MG 24 hr tablet Take 1 tablet (100 mg total) by mouth daily. Take with or immediately following a meal. 90 tablet 3   mirtazapine (REMERON) 15 MG tablet Take 15 mg by mouth at bedtime.     pantoprazole (PROTONIX) 40 MG tablet Take 40 mg by mouth daily.     pantoprazole sodium (PROTONIX) 40 mg/20 mL PACK daily.     No facility-administered medications prior to visit.    Allergies  Allergen Reactions   Codeine Nausea And Vomiting    Confusion and could not stay awake   Meloxicam Nausea Only    And stomach cramping   Paroxetine Hcl Other (See Comments)    Sleep all the time    ROS Review of Systems  Constitutional:  Negative for chills and fever.  Respiratory:  Positive for shortness of breath. Negative for wheezing.   Cardiovascular:  Positive for leg swelling. Negative for chest pain and palpitations.  Gastrointestinal:  Negative for abdominal pain, blood in stool, constipation, diarrhea, nausea and vomiting.  Skin:  Negative for color change and pallor.  Psychiatric/Behavioral:  Negative for suicidal ideas.      Objective:    Physical Exam Vitals and nursing note reviewed.  Constitutional:      Appearance: She is obese.  Neck:     Vascular: No carotid bruit.  Cardiovascular:     Rate and Rhythm: Normal rate and regular rhythm.     Heart sounds: No murmur heard. Pulmonary:     Effort: Pulmonary effort is normal.     Breath sounds: Normal breath sounds.  Abdominal:      General: Bowel sounds are normal.  Musculoskeletal:     Right lower leg: 1+ Pitting Edema present.     Left lower leg: 1+ Pitting Edema present.  Lymphadenopathy:     Cervical: No cervical adenopathy.  Skin:    General: Skin is warm.  Neurological:     Mental Status: She is alert.  Motor: No weakness.     Gait: Gait normal.  Psychiatric:        Mood and Affect: Mood normal.        Behavior: Behavior normal.        Thought Content: Thought content normal.        Judgment: Judgment normal.    BP 134/72   Pulse 64   Temp 97.8 F (36.6 C)   Resp 18   Ht $R'5\' 5"'sA$  (1.651 m)   Wt 218 lb 4 oz (99 kg)   SpO2 94%   BMI 36.32 kg/m  Wt Readings from Last 3 Encounters:  03/17/21 218 lb 4 oz (99 kg)  11/23/20 206 lb (93.4 kg)  10/26/20 206 lb 9.6 oz (93.7 kg)     Health Maintenance Due  Topic Date Due   HIV Screening  Never done   Hepatitis C Screening  Never done   TETANUS/TDAP  Never done   PAP SMEAR-Modifier  Never done   Zoster Vaccines- Shingrix (1 of 2) Never done   COVID-19 Vaccine (3 - Booster for Pfizer series) 09/02/2020   INFLUENZA VACCINE  03/06/2021    There are no preventive care reminders to display for this patient.  Lab Results  Component Value Date   TSH 6.513 (H) 12/04/2019   Lab Results  Component Value Date   WBC 7.5 06/27/2020   HGB 13.3 06/27/2020   HCT 40.6 06/27/2020   MCV 93.1 06/27/2020   PLT 232 06/27/2020   Lab Results  Component Value Date   NA 138 06/27/2020   K 3.5 06/27/2020   CO2 26 06/27/2020   GLUCOSE 112 (H) 06/27/2020   BUN 8 06/27/2020   CREATININE 0.73 06/27/2020   BILITOT 1.2 03/17/2020   ALKPHOS 83 03/17/2020   AST 98 (H) 03/17/2020   ALT 97 (H) 03/17/2020   PROT 7.7 03/17/2020   ALBUMIN 4.2 03/17/2020   CALCIUM 9.2 06/27/2020   ANIONGAP 9 06/27/2020   No results found for: CHOL No results found for: HDL No results found for: LDLCALC No results found for: TRIG No results found for: CHOLHDL No results found  for: HGBA1C    Assessment & Plan:   Problem List Items Addressed This Visit       Cardiovascular and Mediastinum   Essential hypertension   Relevant Orders   CBC   Comprehensive metabolic panel   Paroxysmal atrial fibrillation ([place])    Managed by cardiology and interventional cardiology.  Patient anticoagulated currently.  She underwent an ablation December 2021.  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  But she did experience some SVT.  Does have a follow-up appointment within the next month with cardiology. Continue apixaban 5 mg twice daily.        Digestive   Gastroesophageal reflux disease without esophagitis    Did discuss Protonix use with patient.  States has been on it approximately 1 year.  Symptoms well controlled has had to use Tums 1 time since being on Protonix.  Did educate patient about food choices.  She does avoid eating right before bed as that exacerbates her symptoms.  Encouraged healthy lifestyle and exercise.      Relevant Medications   pantoprazole (PROTONIX) 40 MG tablet   Hepatic steatosis    History of elevated and LFTs.  With diagnostic ultrasound showing hepatic steatosis.  Will order c-Met in office.  Pending lab results. Encouraged lifestyle modifications and exercise      Relevant Orders   Comprehensive metabolic  panel     Musculoskeletal and Integument   Skin lesion    States had skin tag on labia for approximately 1 year.  Last PCP prescribed creams that were not financially viable.  Patient states he has grown over the past year and would like it evaluated will refer to GYN.      Relevant Orders   Ambulatory referral to Gynecology     Other   Class 2 severe obesity with serious comorbidity and body mass index (BMI) of 35.0 to 35.9 in adult [facility]) - Primary    Did discuss diet modifications with patient including trying to eliminate excess sugar such as cakes and sodas.  Patient does have a treadmill at home states approximately 2  times a week she will walk on it for approximately 20 to 30 minutes at a time.  Encouraged gentle ramp-up target goal of 5 times a week 30 minutes each.  Check lipids and A1c in office today.  Pending lab results.      Relevant Orders   Hemoglobin A1c   Lipid panel   Bipolar depression ([place])    Historical diagnosis.  Patient maintained on Abilify and mirtazapine. Continue Abilify and mirtazapine as prescribed.      Relevant Orders   Hemoglobin A1c   Encounter to establish care    Reviewed available electronic medical records.  Did have release signed in office to get previous PCPs information.  Pending records      Vitamin D deficiency    History of the same.  Patient states she is on vitamin D 5000 IUs daily.  We will check vitamin D level in office today.  Pending lab results.      Relevant Orders   VITAMIN D 25 Hydroxy (Vit-D Deficiency, Fractures)    No orders of the defined types were placed in this encounter.   Follow-up: Return in about 6 months (around 09/17/2021) for CPE and labs.   This visit occurred during the SARS-CoV-2 public health emergency.  Safety protocols were in place, including screening questions prior to the visit, additional usage of staff PPE, and extensive cleaning of exam room while observing appropriate contact time as indicated for disinfecting solutions.   Romilda Garret, NP

## 2021-03-17 NOTE — Assessment & Plan Note (Signed)
History of elevated and LFTs.  With diagnostic ultrasound showing hepatic steatosis.  Will order c-Met in office.  Pending lab results. Encouraged lifestyle modifications and exercise

## 2021-03-17 NOTE — Assessment & Plan Note (Signed)
Historical diagnosis.  Patient maintained on Abilify and mirtazapine. Continue Abilify and mirtazapine as prescribed.

## 2021-03-17 NOTE — Assessment & Plan Note (Signed)
History of the same.  Patient states she is on vitamin D 5000 IUs daily.  We will check vitamin D level in office today.  Pending lab results.

## 2021-03-17 NOTE — Assessment & Plan Note (Signed)
Did discuss Protonix use with patient.  States has been on it approximately 1 year.  Symptoms well controlled has had to use Tums 1 time since being on Protonix.  Did educate patient about food choices.  She does avoid eating right before bed as that exacerbates her symptoms.  Encouraged healthy lifestyle and exercise.

## 2021-03-17 NOTE — Assessment & Plan Note (Signed)
States had skin tag on labia for approximately 1 year.  Last PCP prescribed creams that were not financially viable.  Patient states he has grown over the past year and would like it evaluated will refer to GYN.

## 2021-03-17 NOTE — Assessment & Plan Note (Signed)
Managed by cardiology and interventional cardiology.  Patient anticoagulated currently.  She underwent an ablation December 2021.  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  But she did experience some SVT.  Does have a follow-up appointment within the next month with cardiology. Continue apixaban 5 mg twice daily.

## 2021-03-22 LAB — LIPID PANEL
Cholesterol: 164 mg/dL (ref <200)
HDL: 49 mg/dL — ABNORMAL LOW (ref >=50)
LDL Cholesterol (Calc): 96 mg/dL (calc)
Non-HDL Cholesterol (Calc): 115 mg/dL (calc) (ref <130)
Total CHOL/HDL Ratio: 3.3 (calc) (ref <5.0)
Triglycerides: 92 mg/dL (ref <150)

## 2021-03-22 LAB — HEMOGLOBIN A1C
Hgb A1c MFr Bld: 5.6 % of total Hgb (ref <5.7)
Mean Plasma Glucose: 114 mg/dL
eAG (mmol/L): 6.3 mmol/L

## 2021-03-22 LAB — COMPREHENSIVE METABOLIC PANEL
AG Ratio: 1.4 (calc) (ref 1.0–2.5)
ALT: 31 U/L — ABNORMAL HIGH (ref 6–29)
AST: 25 U/L (ref 10–35)
Albumin: 4.2 g/dL (ref 3.6–5.1)
Alkaline phosphatase (APISO): 107 U/L (ref 37–153)
BUN: 9 mg/dL (ref 7–25)
CO2: 29 mmol/L (ref 20–32)
Calcium: 9.3 mg/dL (ref 8.6–10.4)
Chloride: 105 mmol/L (ref 98–110)
Creat: 0.88 mg/dL (ref 0.50–1.03)
Globulin: 3 g/dL (calc) (ref 1.9–3.7)
Glucose, Bld: 76 mg/dL (ref 65–99)
Potassium: 3.9 mmol/L (ref 3.5–5.3)
Sodium: 141 mmol/L (ref 135–146)
Total Bilirubin: 0.6 mg/dL (ref 0.2–1.2)
Total Protein: 7.2 g/dL (ref 6.1–8.1)

## 2021-03-22 LAB — VITAMIN D 25 HYDROXY (VIT D DEFICIENCY, FRACTURES): Vit D, 25-Hydroxy: 24 ng/mL — ABNORMAL LOW (ref 30–100)

## 2021-03-22 LAB — CBC
HCT: 39 % (ref 35.0–45.0)
Hemoglobin: 13.3 g/dL (ref 11.7–15.5)
MCH: 30.5 pg (ref 27.0–33.0)
MCHC: 34.1 g/dL (ref 32.0–36.0)
MCV: 89.4 fL (ref 80.0–100.0)
MPV: 11.9 fL (ref 7.5–12.5)
Platelets: 246 10*3/uL (ref 140–400)
RBC: 4.36 10*6/uL (ref 3.80–5.10)
RDW: 12.4 % (ref 11.0–15.0)
WBC: 10.3 10*3/uL (ref 3.8–10.8)

## 2021-03-31 ENCOUNTER — Encounter: Payer: Self-pay | Admitting: Cardiology

## 2021-03-31 ENCOUNTER — Ambulatory Visit (INDEPENDENT_AMBULATORY_CARE_PROVIDER_SITE_OTHER): Payer: BC Managed Care – PPO | Admitting: Cardiology

## 2021-03-31 ENCOUNTER — Other Ambulatory Visit: Payer: Self-pay

## 2021-03-31 VITALS — BP 104/60 | HR 63 | Ht 65.0 in | Wt 220.0 lb

## 2021-03-31 DIAGNOSIS — I10 Essential (primary) hypertension: Secondary | ICD-10-CM

## 2021-03-31 DIAGNOSIS — I480 Paroxysmal atrial fibrillation: Secondary | ICD-10-CM

## 2021-03-31 NOTE — Patient Instructions (Signed)
Medication Instructions:  Your physician recommends that you continue on your current medications as directed. Please refer to the Current Medication list given to you today.  *If you need a refill on your cardiac medications before your next appointment, please call your pharmacy*   Lab Work: None ordered If you have labs (blood work) drawn today and your tests are completely normal, you will receive your results only by: [place] (if you have MyChart) OR A paper copy in the mail If you have any lab test that is abnormal or we need to change your treatment, we will call you to review the results.   Testing/Procedures: None ordered   Follow-Up: At [facility], you and your health needs are our priority.  As part of our continuing mission to provide you with exceptional heart care, we have created designated Provider Care Teams.  These Care Teams include your primary Cardiologist (physician) and Advanced Practice Providers (APPs -  Physician Assistants and Nurse Practitioners) who all work together to provide you with the care you need, when you need it.  We recommend signing up for the patient portal called "MyChart".  Sign up information is provided on this After Visit Summary.  MyChart is used to connect with patients for Virtual Visits (Telemedicine).  Patients are able to view lab/test results, encounter notes, upcoming appointments, etc.  Non-urgent messages can be sent to your provider as well.   To learn more about what you can do with MyChart, go to [email or web address].    Your next appointment:   1 year(s)  The format for your next appointment:   In Person  Provider:   You may see Kate Sable, MD or one of the following Advanced Practice Providers on your designated Care Team:   Murray Hodgkins, NP [name], PA-C Marrianne Mood, PA-C Cadence Kathlen Mody, [place]   Other Instructions

## 2021-03-31 NOTE — Progress Notes (Signed)
Cardiology Office Note:    Date:  03/31/2021   ID:  Victoria Williamson, DOB Dec 06, 1965, MRN [id number]  PCP:  Michela Pitcher, NP  [facility] Cardiologist:  Kate Sable, MD  [facility] Electrophysiologist:  Vickie Epley, MD   Referring MD: No ref. provider found   Chief Complaint  Patient presents with   Other    6 month follow up -- Patient c.o swelling in feet/ankles. Meds reviewed verbally with patient.      History of Present Illness:    Victoria Williamson is a 55 y.o. female with a hx of hypertension, paroxysmal atrial fibrillation s/p PVI 07/2020, SVT who presents for follow-up.   She previously had  palpitations which were fairly frequent after her ablation.  Medications were suggested by EP, started on Toprol-XL with good effect.  Flecainide caused her to have dry mouth but this was stopped.  She rarely has palpitations maybe once every 2 to 3 months, which are not lasting.  Tolerating Toprol-XL 100 mg daily, Eliquis 5 mg twice daily.  Blood pressure also controlled.  Has occasional swelling in her legs, has as needed Lasix ordered.   Prior notes  Patient was seen in the emergency room 2 months ago due to palpitations and weakness.  EKG on 03/17/2020 while in the ED showed SVT, heart rate 147.    Review of EMR showed patient had a PET myocardial perfusion stress test 01/2020 with no evidence of ischemia.   Echocardiogram on 12/2019 showed normal systolic function, normal wall thickness, EF [id number], grade 2 diastolic dysfunction.   Had a left heart cath in 2014 and was told she had normal coronary arteries.  Patient states having symptoms of weakness/fatigue, shortness of breath whenever she goes into atrial fibrillation.  Initially diagnosed with atrial fibrillation in February 2021.    Past Medical History:  Diagnosis Date   Anxiety    Arthritis    Chest pain    normal coronary arteries by cardiac cath oct 29,2014   Colon polyp    Depression     Dysautonomia ([place]) 12/18/2013   Fatty liver    Heart disease    History of blood transfusion    Hypertension    PAF (paroxysmal atrial fibrillation) ([place])    Skin cancer, basal cell    Thyroid nodule     Past Surgical History:  Procedure Laterality Date   ATRIAL FIBRILLATION ABLATION N/A 07/15/2020   Procedure: ATRIAL FIBRILLATION ABLATION;  Surgeon: Vickie Epley, MD;  Location: [place] [facility];  Service: Cardiovascular;  Laterality: N/A;   btl     CARDIOVERSION N/A 05/11/2020   Procedure: CARDIOVERSION;  Surgeon: Kate Sable, MD;  Location: [place] [facility];  Service: Cardiovascular;  Laterality: N/A;   CARPAL TUNNEL RELEASE Left 06/23/2020   Procedure: CARPAL TUNNEL RELEASE;  Surgeon: [name], MD;  Location: [place];  Service: Orthopedics;  Laterality: Left;  IV REGIONAL FOREARM BLOCK   ENDOMETRIAL ABLATION     right carpal tunel      Current Medications: Current Meds  Medication Sig   apixaban (ELIQUIS) 5 MG TABS tablet Take 1 tablet (5 mg total) by mouth 2 (two) times daily.   ARIPiprazole (ABILIFY) 10 MG tablet Take 0.5 tablets (5 mg total) by mouth daily.   Cholecalciferol ([employer] VIT D 5000 HIGH-POTENCY PO) Take by mouth daily in the afternoon.   furosemide (LASIX) 20 MG tablet Take 1 tablet (20 mg total) by mouth daily as needed.  metoprolol succinate (TOPROL-XL) 100 MG 24 hr tablet Take 1 tablet (100 mg total) by mouth daily. Take with or immediately following a meal.   mirtazapine (REMERON) 15 MG tablet Take 15 mg by mouth at bedtime.   pantoprazole (PROTONIX) 40 MG tablet Take 40 mg by mouth daily.     Allergies:   Codeine, Meloxicam, and Paroxetine hcl   Social History   Socioeconomic History   Marital status: Married    Spouse name: Not on file   Number of children: Not on file   Years of education: Not on file   Highest education level: Not on file  Occupational History   Not on file  Tobacco Use   Smoking status: Former     Packs/day: 1.00    Years: 36.00    Pack years: 36.00    Types: Cigarettes    Quit date: 12/29/2019    Years since quitting: 1.2   Smokeless tobacco: Never  Vaping Use   Vaping Use: Every day  Substance and Sexual Activity   Alcohol use: Not Currently   Drug use: Never   Sexual activity: Yes    Partners: Male  Other Topics Concern   Not on file  Social History Narrative   Lives at home with husband   Social Determinants of Health   Financial Resource Strain: Not on file  Food Insecurity: Not on file  Transportation Needs: Not on file  Physical Activity: Not on file  Stress: Not on file  Social Connections: Not on file     Family History: The patient's family history includes Alcohol abuse in her father; Arthritis in her mother; Breast cancer (age of onset: 54) in her maternal aunt; Breast cancer (age of onset: 52) in her maternal grandmother; COPD in her mother; Depression in her paternal grandmother; Diabetes in her mother and another family member; Hearing loss in her father; Heart attack in her brother, father, and paternal grandfather; Heart disease in her father, mother, and paternal grandfather; High Cholesterol in her father and mother; Hypertension in her father and mother; Kidney disease in her mother.  ROS:   Please see the history of present illness.     All other systems reviewed and are negative.  EKGs/Labs/Other Studies Reviewed:    The following studies were reviewed today:     EKG:  EKG is  ordered today.  The ekg ordered today demonstrates normal sinus rhythm, heart rate 63  Recent Labs: 03/17/2021: ALT 31; BUN 9; Creat 0.88; Hemoglobin 13.3; Platelets 246; Potassium 3.9; Sodium 141  Recent Lipid Panel    Component Value Date/Time   CHOL 164 03/17/2021 1543   TRIG 92 03/17/2021 1543   HDL 49 (L) 03/17/2021 1543   CHOLHDL 3.3 03/17/2021 1543   LDLCALC 96 03/17/2021 1543    Physical Exam:    VS:  BP 104/60 (BP Location: Left Arm, Patient  Position: Sitting, Cuff Size: Large)   Pulse 63   Ht '5\' 5"'$  (1.651 m)   Wt 220 lb (99.8 kg)   SpO2 98%   BMI 36.61 kg/m     Wt Readings from Last 3 Encounters:  03/31/21 220 lb (99.8 kg)  03/17/21 218 lb 4 oz (99 kg)  11/23/20 206 lb (93.4 kg)     GEN:  Well nourished, well developed in no acute distress HEENT: Normal NECK: No JVD; No carotid bruits LYMPHATICS: No lymphadenopathy CARDIAC: Regular rate and rhythm, no murmurs RESPIRATORY:  Clear to auscultation without rales, wheezing or rhonchi  ABDOMEN: Soft, non-tender, non-distended MUSCULOSKELETAL:  No edema; No deformity  SKIN: Warm and dry NEUROLOGIC:  Alert and oriented x 3 PSYCHIATRIC:  Normal affect   ASSESSMENT:    1. Paroxysmal atrial fibrillation (HCC)   2. Essential hypertension     PLAN:    In order of problems listed above:  paroxysmal atrial fibrillation, status post PVI 07/2020 currently in sinus rhythm.  CHA2DS2-VASc of 2. continue Toprol-XL 100 mg daily, Eliquis 5 mg twice daily.  Take Lasix 20 mg daily as needed for edema. History of hypertension, BP controlled.  Continue Toprol-XL.  Follow-up yearly  Total encounter time more than 35 minutes  Greater than 50% was spent in counseling and coordination of care with the patient    Medication Adjustments/Labs and Tests Ordered: Current medicines are reviewed at length with the patient today.  Concerns regarding medicines are outlined above.  Orders Placed This Encounter  Procedures   EKG 12-Lead    No orders of the defined types were placed in this encounter.   Patient Instructions  Medication Instructions:  Your physician recommends that you continue on your current medications as directed. Please refer to the Current Medication list given to you today.  *If you need a refill on your cardiac medications before your next appointment, please call your pharmacy*   Lab Work: None ordered If you have labs (blood work) drawn today and your  tests are completely normal, you will receive your results only by: [place] (if you have MyChart) OR A paper copy in the mail If you have any lab test that is abnormal or we need to change your treatment, we will call you to review the results.   Testing/Procedures: None ordered   Follow-Up: At [facility], you and your health needs are our priority.  As part of our continuing mission to provide you with exceptional heart care, we have created designated Provider Care Teams.  These Care Teams include your primary Cardiologist (physician) and Advanced Practice Providers (APPs -  Physician Assistants and Nurse Practitioners) who all work together to provide you with the care you need, when you need it.  We recommend signing up for the patient portal called "MyChart".  Sign up information is provided on this After Visit Summary.  MyChart is used to connect with patients for Virtual Visits (Telemedicine).  Patients are able to view lab/test results, encounter notes, upcoming appointments, etc.  Non-urgent messages can be sent to your provider as well.   To learn more about what you can do with MyChart, go to [email or web address].    Your next appointment:   1 year(s)  The format for your next appointment:   In Person  Provider:   You may see Kate Sable, MD or one of the following Advanced Practice Providers on your designated Care Team:   Murray Hodgkins, NP [name], PA-C Marrianne Mood, PA-C Cadence [place], [place]   Other Instructions    Signed, Kate Sable, MD  03/31/2021 4:59 PM    [place]

## 2021-04-11 ENCOUNTER — Encounter: Payer: Self-pay | Admitting: Nurse Practitioner

## 2021-04-12 ENCOUNTER — Other Ambulatory Visit: Payer: Self-pay | Admitting: Nurse Practitioner

## 2021-04-12 DIAGNOSIS — G8929 Other chronic pain: Secondary | ICD-10-CM

## 2021-04-12 DIAGNOSIS — M549 Dorsalgia, unspecified: Secondary | ICD-10-CM

## 2021-04-12 MED ORDER — PREDNISONE 20 MG PO TABS: 20.0000 mg | ORAL_TABLET | Freq: Every day | ORAL | 0 refills | Status: DC

## 2021-04-21 ENCOUNTER — Telehealth: Payer: BC Managed Care – PPO | Admitting: Family Medicine

## 2021-04-24 ENCOUNTER — Other Ambulatory Visit: Payer: Self-pay | Admitting: *Deleted

## 2021-04-24 DIAGNOSIS — Z87891 Personal history of nicotine dependence: Secondary | ICD-10-CM

## 2021-04-25 ENCOUNTER — Telehealth (INDEPENDENT_AMBULATORY_CARE_PROVIDER_SITE_OTHER): Payer: BC Managed Care – PPO | Admitting: Family Medicine

## 2021-04-25 ENCOUNTER — Encounter: Payer: Self-pay | Admitting: Family Medicine

## 2021-04-25 ENCOUNTER — Other Ambulatory Visit: Payer: Self-pay

## 2021-04-25 VITALS — BP 132/73 | HR 73 | Temp 99.5°F | Ht 65.0 in | Wt 218.0 lb

## 2021-04-25 DIAGNOSIS — U071 COVID-19: Secondary | ICD-10-CM

## 2021-04-25 MED ORDER — MOLNUPIRAVIR EUA 200MG CAPSULE: 4.0000 | ORAL_CAPSULE | Freq: Two times a day (BID) | ORAL | 0 refills | Status: AC

## 2021-04-25 NOTE — Patient Instructions (Signed)
COVID19  Infection < 5 days from onset of symptoms in double vaccinated obese individual with history of atrial fibrillation, HFpEF  No clear sign of bacterial infection at this time.   No SOB.  No red flags/need for ER visit or in-person exam at respiratory clinic at this time..    Pt higher risk for COVID complications given  obesity, atrial fibrillation, HFpEF . GFR  60but she canot use Paxlovid due to Eliquis interaction.  Start Molnupiravir 5 day course. Reviewed course of medication and side effect profile with patient in detail.   Symptomatic care with mucinex and cough suppressant at night. If SOB begins symptoms worsening.. have low threshold for in-person exam, if severe shortness of breath ER visit recommended.  Can monitor Oxygen saturation at home with home monitor if able to obtain.  Go to ER if O2 sat < 90% on room air.   Reviewed home care and provided information through [place].  Recommended quarantine 5 days isolation recommended. Return to work day 6 and wear mask for 4 more days to complete 10 days. Provided info about prevention of spread of COVID 19.      Person Under Monitoring Name: Victoria Williamson  Location: [place] [address]   Infection Prevention Recommendations for Individuals Confirmed to have, or Being Evaluated for, 2019 Novel Coronavirus (COVID-19) Infection Who Receive Care at Home  Individuals who are confirmed to have, or are being evaluated for, COVID-19 should follow the prevention steps below until a healthcare provider or local or state health department says they can return to normal activities.  Stay home except to get medical care You should restrict activities outside your home, except for getting medical care. Do not go to work, school, or public areas, and do not use public transportation or taxis.  Call ahead before visiting your doctor Before your medical appointment, call the healthcare  provider and tell them that you have, or are being evaluated for, COVID-19 infection. This will help the healthcare provider's office take steps to keep other people from getting infected. Ask your healthcare provider to call the local or state health department.  Monitor your symptoms Seek prompt medical attention if your illness is worsening (e.g., difficulty breathing). Before going to your medical appointment, call the healthcare provider and tell them that you have, or are being evaluated for, COVID-19 infection. Ask your healthcare provider to call the local or state health department.  Wear a facemask You should wear a facemask that covers your nose and mouth when you are in the same room with other people and when you visit a healthcare provider. People who live with or visit you should also wear a facemask while they are in the same room with you.  Separate yourself from other people in your home As much as possible, you should stay in a different room from other people in your home. Also, you should use a separate bathroom, if available.  Avoid sharing household items You should not share dishes, drinking glasses, cups, eating utensils, towels, bedding, or other items with other people in your home. After using these items, you should wash them thoroughly with soap and water.  Cover your coughs and sneezes Cover your mouth and nose with a tissue when you cough or sneeze, or you can cough or sneeze into your sleeve. Throw used tissues in a lined trash can, and immediately wash your hands with soap and water for at least 20 seconds or use an  alcohol-based hand rub.  Wash your [facility] your hands often and thoroughly with soap and water for at least 20 seconds. You can use an alcohol-based hand sanitizer if soap and water are not available and if your hands are not visibly dirty. Avoid touching your eyes, nose, and mouth with unwashed hands.   Prevention Steps for Caregivers  and Household Members of Individuals Confirmed to have, or Being Evaluated for, COVID-19 Infection Being Cared for in the Home  If you live with, or provide care at home for, a person confirmed to have, or being evaluated for, COVID-19 infection please follow these guidelines to prevent infection:  Follow healthcare provider's instructions Make sure that you understand and can help the patient follow any healthcare provider instructions for all care.  Provide for the patient's basic needs You should help the patient with basic needs in the home and provide support for getting groceries, prescriptions, and other personal needs.  Monitor the patient's symptoms If they are getting sicker, call his or her medical provider and tell them that the patient has, or is being evaluated for, COVID-19 infection. This will help the healthcare provider's office take steps to keep other people from getting infected. Ask the healthcare provider to call the local or state health department.  Limit the number of people who have contact with the patient If possible, have only one caregiver for the patient. Other household members should stay in another home or place of residence. If this is not possible, they should stay in another room, or be separated from the patient as much as possible. Use a separate bathroom, if available. Restrict visitors who do not have an essential need to be in the home.  Keep older adults, very young children, and other sick people away from the patient Keep older adults, very young children, and those who have compromised immune systems or chronic health conditions away from the patient. This includes people with chronic heart, lung, or kidney conditions, diabetes, and cancer.  Ensure good ventilation Make sure that shared spaces in the home have good air flow, such as from an air conditioner or an opened window, weather permitting.  Wash your hands often Wash your hands  often and thoroughly with soap and water for at least 20 seconds. You can use an alcohol based hand sanitizer if soap and water are not available and if your hands are not visibly dirty. Avoid touching your eyes, nose, and mouth with unwashed hands. Use disposable paper towels to dry your hands. If not available, use dedicated cloth towels and replace them when they become wet.  Wear a facemask and gloves Wear a disposable facemask at all times in the room and gloves when you touch or have contact with the patient's blood, body fluids, and/or secretions or excretions, such as sweat, saliva, sputum, nasal mucus, vomit, urine, or feces.  Ensure the mask fits over your nose and mouth tightly, and do not touch it during use. Throw out disposable facemasks and gloves after using them. Do not reuse. Wash your hands immediately after removing your facemask and gloves. If your personal clothing becomes contaminated, carefully remove clothing and launder. Wash your hands after handling contaminated clothing. Place all used disposable facemasks, gloves, and other waste in a lined container before disposing them with other household waste. Remove gloves and wash your hands immediately after handling these items.  Do not share dishes, glasses, or other household items with the patient Avoid sharing household items. You should  not share dishes, drinking glasses, cups, eating utensils, towels, bedding, or other items with a patient who is confirmed to have, or being evaluated for, COVID-19 infection. After the person uses these items, you should wash them thoroughly with soap and water.  Wash laundry thoroughly Immediately remove and wash clothes or bedding that have blood, body fluids, and/or secretions or excretions, such as sweat, saliva, sputum, nasal mucus, vomit, urine, or feces, on them. Wear gloves when handling laundry from the patient. Read and follow directions on labels of laundry or clothing items  and detergent. In general, wash and dry with the warmest temperatures recommended on the label.  Clean all areas the individual has used often Clean all touchable surfaces, such as counters, tabletops, doorknobs, bathroom fixtures, toilets, phones, keyboards, tablets, and bedside tables, every day. Also, clean any surfaces that may have blood, body fluids, and/or secretions or excretions on them. Wear gloves when cleaning surfaces the patient has come in contact with. Use a diluted bleach solution (e.g., dilute bleach with 1 part bleach and 10 parts water) or a household disinfectant with a label that says EPA-registered for coronaviruses. To make a bleach solution at home, add 1 tablespoon of bleach to 1 quart (4 cups) of water. For a larger supply, add  cup of bleach to 1 gallon (16 cups) of water. Read labels of cleaning products and follow recommendations provided on product labels. Labels contain instructions for safe and effective use of the cleaning product including precautions you should take when applying the product, such as wearing gloves or eye protection and making sure you have good ventilation during use of the product. Remove gloves and wash hands immediately after cleaning.  Monitor yourself for signs and symptoms of illness Caregivers and household members are considered close contacts, should monitor their health, and will be asked to limit movement outside of the home to the extent possible. Follow the monitoring steps for close contacts listed on the symptom monitoring form.   ? If you have additional questions, contact your local health department or call the epidemiologist on call at [phone] (available 24/7). ? This guidance is subject to change. For the most up-to-date guidance from [facility], please refer to their website: [email or web address]

## 2021-04-25 NOTE — Progress Notes (Signed)
VIRTUAL VISIT Due to national recommendations of social distancing due to [place] 19, a virtual visit is felt to be most appropriate for this patient at this time.   I connected with the patient on 04/25/21 at 11:40 AM EDT by virtual telehealth platform and verified that I am speaking with the correct person using two identifiers.   I discussed the limitations, risks, security and privacy concerns of performing an evaluation and management service by  virtual telehealth platform and the availability of in person appointments. I also discussed with the patient that there may be a patient responsible charge related to this service. The patient expressed understanding and agreed to proceed.  Patient location: Home Provider Location: [place] Participants: Eliezer Lofts and [place]   Chief Complaint  Patient presents with   Covid Positive   Cough   Nasal Congestion   Fever    History of Present Illness:  55 year old female patient of Karl Ito NP with history of obesity ( BMI 36), HFpEF, SVT, afib ( ELIQUIS) presents with day 5 of illness.  She reports new onset nasal congestion, chest congestion, sneezing, productive cough.  Fever low grade 99.5 F  No SOB, no wheeze. No ear pain,. Resolved ST, no face pain.  She is starting to feel better.  She is using  Claritin and Coricidin.  COVID 19 screen COVID testing: 9/16 PCR and home test negative, positive home test on 04/24/2021 COVID vaccine: 04/02/2020 , 02/26/2020 COVID exposure: No recent travel or known exposure to [place]  The importance of social distancing was discussed today.    Review of Systems  Constitutional:  Positive for fever and malaise/fatigue.  HENT:  Positive for congestion.   Respiratory:  Positive for cough and sputum production. Negative for shortness of breath.   Cardiovascular:  Negative for chest pain.  Skin:  Negative for rash.     Past Medical History:  Diagnosis Date   Anxiety     Arthritis    Chest pain    normal coronary arteries by cardiac cath oct 29,2014   Colon polyp    Depression    Dysautonomia ([place]) 12/18/2013   Fatty liver    Heart disease    History of blood transfusion    Hypertension    PAF (paroxysmal atrial fibrillation) (HCC)    Skin cancer, basal cell    Thyroid nodule     reports that she quit smoking about 15 months ago. Her smoking use included cigarettes. She has a 36.00 pack-year smoking history. She has never used smokeless tobacco. She reports that she does not currently use alcohol. She reports that she does not use drugs.   Current Outpatient Medications:    apixaban (ELIQUIS) 5 MG TABS tablet, Take 1 tablet (5 mg total) by mouth 2 (two) times daily., Disp: 60 tablet, Rfl: 11   ARIPiprazole (ABILIFY) 10 MG tablet, Take 0.5 tablets (5 mg total) by mouth daily., Disp: 90 tablet, Rfl: 0   Cholecalciferol ([employer] VIT D 5000 HIGH-POTENCY PO), Take by mouth daily in the afternoon., Disp: , Rfl:    furosemide (LASIX) 20 MG tablet, Take 1 tablet (20 mg total) by mouth daily as needed., Disp: 30 tablet, Rfl:    mirtazapine (REMERON) 15 MG tablet, Take 15 mg by mouth at bedtime., Disp: , Rfl:    pantoprazole (PROTONIX) 40 MG tablet, Take 40 mg by mouth daily., Disp: , Rfl:    predniSONE (DELTASONE) 20 MG tablet, Take 1 tablet (20  mg total) by mouth daily with breakfast., Disp: 5 tablet, Rfl: 0   metoprolol succinate (TOPROL-XL) 100 MG 24 hr tablet, Take 1 tablet (100 mg total) by mouth daily. Take with or immediately following a meal., Disp: 90 tablet, Rfl: 3   Observations/Objective: Blood pressure 132/73, pulse 73, temperature 99.5 F (37.5 C), temperature source Oral, height 5\' 5"  (1.651 m), weight 218 lb (98.9 kg).  Physical Exam  Physical Exam Constitutional:      General: The patient is not in acute distress. Pulmonary:     Effort: Pulmonary effort is normal. No respiratory distress.  Neurological:     Mental Status: The patient is alert  and oriented to person, place, and time.  Psychiatric:        Mood and Affect: Mood normal.        Behavior: Behavior normal.   Assessment and Plan Problem List Items Addressed This Visit     COVID-19 - Primary    COVID19  Infection < 5 days from onset of symptoms in double vaccinated obese individual with history of atrial fibrillation, HFpEF  No clear sign of bacterial infection at this time.   No SOB.  No red flags/need for ER visit or in-person exam at respiratory clinic at this time..    Pt higher risk for COVID complications given  obesity, atrial fibrillation, HFpEF . GFR  60but she canot use Paxlovid due to Eliquis interaction.  Start Molnupiravir 5 day course. Reviewed course of medication and side effect profile with patient in detail.   Symptomatic care with mucinex and cough suppressant at night. If SOB begins symptoms worsening.. have low threshold for in-person exam, if severe shortness of breath ER visit recommended.  Can monitor Oxygen saturation at home with home monitor if able to obtain.  Go to ER if O2 sat < 90% on room air.   Reviewed home care and provided information through [place].  Recommended quarantine 5 days isolation recommended. Return to work day 6 and wear mask for 4 more days to complete 10 days. Provided info about prevention of spread of COVID 19.       Relevant Medications   molnupiravir EUA (LAGEVRIO) 200 mg CAPS capsule      I discussed the assessment and treatment plan with the patient. The patient was provided an opportunity to ask questions and all were answered. The patient agreed with the plan and demonstrated an understanding of the instructions.   The patient was advised to call back or seek an in-person evaluation if the symptoms worsen or if the condition fails to improve as anticipated.     Eliezer Lofts, MD

## 2021-04-25 NOTE — Assessment & Plan Note (Signed)
COVID19  Infection < 5 days from onset of symptoms in double vaccinated obese individual with history of atrial fibrillation, HFpEF  No clear sign of bacterial infection at this time.   No SOB.  No red flags/need for ER visit or in-person exam at respiratory clinic at this time..    Pt higher risk for COVID complications given  obesity, atrial fibrillation, HFpEF . GFR  60but she canot use Paxlovid due to Eliquis interaction.  Start Molnupiravir 5 day course. Reviewed course of medication and side effect profile with patient in detail.   Symptomatic care with mucinex and cough suppressant at night. If SOB begins symptoms worsening.. have low threshold for in-person exam, if severe shortness of breath ER visit recommended.  Can monitor Oxygen saturation at home with home monitor if able to obtain.  Go to ER if O2 sat < 90% on room air.   Reviewed home care and provided information through [place].  Recommended quarantine 5 days isolation recommended. Return to work day 6 and wear mask for 4 more days to complete 10 days. Provided info about prevention of spread of COVID 19.

## 2021-04-26 ENCOUNTER — Encounter: Payer: Self-pay | Admitting: Family Medicine

## 2021-04-27 ENCOUNTER — Other Ambulatory Visit: Payer: Self-pay | Admitting: Internal Medicine

## 2021-05-04 ENCOUNTER — Other Ambulatory Visit: Payer: Self-pay

## 2021-05-04 ENCOUNTER — Ambulatory Visit (INDEPENDENT_AMBULATORY_CARE_PROVIDER_SITE_OTHER): Payer: BC Managed Care – PPO | Admitting: Obstetrics & Gynecology

## 2021-05-04 ENCOUNTER — Encounter: Payer: Self-pay | Admitting: Obstetrics & Gynecology

## 2021-05-04 ENCOUNTER — Encounter: Payer: Self-pay | Admitting: Radiology

## 2021-05-04 ENCOUNTER — Other Ambulatory Visit: Payer: Self-pay | Admitting: Obstetrics & Gynecology

## 2021-05-04 VITALS — BP 152/87 | HR 67 | Wt 218.0 lb

## 2021-05-04 DIAGNOSIS — N9089 Other specified noninflammatory disorders of vulva and perineum: Secondary | ICD-10-CM

## 2021-05-04 NOTE — Progress Notes (Signed)
GYNECOLOGY OFFICE VISIT NOTE  History:   Victoria Williamson is a 55 y.o. [id number] here today for management of left labial lesion.  Has noticed this for one year, has gotten large and get uncomfortable with intercourse.  She denies any abnormal vaginal discharge, bleeding, pelvic pain or other concerns.    Past Medical History:  Diagnosis Date   Anxiety    Arthritis    Chest pain    normal coronary arteries by cardiac cath oct 29,2014   Colon polyp    Depression    Dysautonomia ([place]) 12/18/2013   Fatty liver    Heart disease    History of blood transfusion    Hypertension    PAF (paroxysmal atrial fibrillation) ([facility])    Skin cancer, basal cell    Thyroid nodule     Past Surgical History:  Procedure Laterality Date   ATRIAL FIBRILLATION ABLATION N/A 07/15/2020   Procedure: ATRIAL FIBRILLATION ABLATION;  Surgeon: Vickie Epley, MD;  Location: [facility];  Service: Cardiovascular;  Laterality: N/A;   btl     CARDIOVERSION N/A 05/11/2020   Procedure: CARDIOVERSION;  Surgeon: Kate Sable, MD;  Location: [facility];  Service: Cardiovascular;  Laterality: N/A;   CARPAL TUNNEL RELEASE Left 06/23/2020   Procedure: CARPAL TUNNEL RELEASE;  Surgeon: [name], MD;  Location: [facility];  Service: Orthopedics;  Laterality: Left;  IV REGIONAL FOREARM BLOCK   ENDOMETRIAL ABLATION     right carpal tunel      The following portions of the patient's history were reviewed and updated as appropriate: allergies, current medications, past family history, past medical history, past social history, past surgical history and problem list.   Health Maintenance:  Normal pap and negative HRHPV on 11/10/2020.  Normal mammogram on 10/14/2020.   Review of Systems:  Pertinent items noted in HPI and remainder of comprehensive ROS otherwise negative.  Physical Exam:  BP (!) 152/87   Pulse 67   Wt 218 lb (98.9 kg)   BMI 36.28 kg/m  CONSTITUTIONAL: Well-developed,  well-nourished female in no acute distress.  HEENT:  Normocephalic, atraumatic. External right and left ear normal. No scleral icterus.  NECK: Normal range of motion, supple, no masses noted on observation SKIN: No rash noted. Not diaphoretic. No erythema. No pallor. MUSCULOSKELETAL: Normal range of motion. No edema noted. NEUROLOGIC: Alert and oriented to person, place, and time. Normal muscle tone coordination. No cranial nerve deficit noted. PSYCHIATRIC: Normal mood and affect. Normal behavior. Normal judgment and thought content. CARDIOVASCULAR: Normal heart rate noted RESPIRATORY: Effort and breath sounds normal, no problems with respiration noted ABDOMEN: No masses noted. No other overt distention noted.   PELVIC: 2 cm x 5 mm lesion with thin stalk emanating from middle part of left labium majus, similarly colored to surrounding labia, resembles skin tag.  Otherwise normal appearing external genitalia with mild atrophy; normal urethral meatus; normal appearing vaginal distal mucosa.  No abnormal discharge noted.   Performed in the presence of a chaperone  Patient desires removal of this lesion.  VULVA LESION REMOVAL NOTE The indications for procedure were reviewed.   Risks of the biopsy including pain, bleeding, infection, inadequate specimen, scarring and need for additional procedures  were discussed. The patient stated understanding and agreed to undergo procedure today. Consent was signed,  time out performed.  The patient's vulva was prepped with Betadine. 1 ml of 2% lidocaine was injected into the base of the lesion. Scissors were used to excise  the lesion; sent to pathology.  Small bleeding was noted and hemostasis was achieved using two interrupted stitches of 4-0 Vicryl.  The patient tolerated the procedure well.      Assessment and Plan:     1. Labial lesion Labial lesion removed, sent to pathology, will follow up results.  Post-procedure instructions  (pelvic rest for one week)  were given to the patient. The patient is to call with heavy bleeding, fever greater than 100.4, foul smelling vaginal discharge or other concerns.  - Surgical pathology Routine preventative health maintenance measures emphasized. Please refer to After Visit Summary for other counseling recommendations.   Return for any gynecologic concerns.    I spent 20 minutes dedicated to the care of this patient including pre-visit review of records, face to face time with the patient discussing her conditions and treatments and post visit orders.    Verita Schneiders, MD, [facility] [employer], [place]

## 2021-05-15 ENCOUNTER — Other Ambulatory Visit: Payer: Self-pay

## 2021-05-15 ENCOUNTER — Encounter: Payer: Self-pay | Admitting: Family Medicine

## 2021-05-15 ENCOUNTER — Ambulatory Visit (INDEPENDENT_AMBULATORY_CARE_PROVIDER_SITE_OTHER): Payer: BC Managed Care – PPO | Admitting: Family Medicine

## 2021-05-15 ENCOUNTER — Encounter: Payer: Self-pay | Admitting: Nurse Practitioner

## 2021-05-15 ENCOUNTER — Encounter: Payer: Self-pay | Admitting: Acute Care

## 2021-05-15 ENCOUNTER — Telehealth (INDEPENDENT_AMBULATORY_CARE_PROVIDER_SITE_OTHER): Payer: BC Managed Care – PPO | Admitting: Acute Care

## 2021-05-15 VITALS — BP 114/74 | HR 86 | Temp 97.3°F | Ht 65.0 in | Wt 219.0 lb

## 2021-05-15 DIAGNOSIS — M545 Low back pain, unspecified: Secondary | ICD-10-CM

## 2021-05-15 DIAGNOSIS — Z87891 Personal history of nicotine dependence: Secondary | ICD-10-CM | POA: Diagnosis not present

## 2021-05-15 DIAGNOSIS — G8929 Other chronic pain: Secondary | ICD-10-CM | POA: Insufficient documentation

## 2021-05-15 MED ORDER — PREDNISONE 20 MG PO TABS: 20.0000 mg | ORAL_TABLET | Freq: Every day | ORAL | 0 refills | Status: DC

## 2021-05-15 NOTE — Assessment & Plan Note (Signed)
No signs of nerve compression today and MRI 2021 with DDD but no compression. Discussed that with recurrent symptoms PT and weight loss will probably be most effective at reducing symptoms. She is not sure she can afford PT. Will also do 5 days of prednisone as this was effective at the beginning of month to reduce symptoms. Discussed risks with apixaban.

## 2021-05-15 NOTE — Patient Instructions (Signed)
Take prednisone for 5 days Work on weight loss  Core strengthening programs - exercises for this    #Referral I have placed a referral to a specialist for you. You should receive a phone call from the specialty office. Make sure your voicemail is not full and that if you are able to answer your phone to unknown or new numbers.   It may take up to 2 weeks to hear about the referral. If you do not hear anything in 2 weeks, please call our office and ask to speak with the referral coordinator.

## 2021-05-15 NOTE — Progress Notes (Signed)
Virtual Visit via Video Note  I connected with Victoria Williamson on 05/15/21 at  3:00 PM EDT by a video enabled telemedicine application and verified that I am speaking with the correct person using two identifiers.  Location: Patient: At home Provider: [place], [place], [place], [address]    I discussed the limitations of evaluation and management by telemedicine and the availability of in person appointments. The patient expressed understanding and agreed to proceed.   Shared Decision Making Visit Lung Cancer Screening Program [phone])   Eligibility: Age 55 y.o. Pack Years Smoking History Calculation 37 pack year smoking history (# packs/per year x # years smoked) Recent History of coughing up blood  no Unexplained weight loss? no ( >Than 15 pounds within the last 6 months ) Prior History Lung / other cancer no (Diagnosis within the last 5 years already requiring surveillance chest CT Scans). Smoking Status Former Smoker Former Smokers: Years since quit: 1 year  Quit Date: 12/2019  Visit Components: Discussion included one or more decision making aids. yes Discussion included risk/benefits of screening. yes Discussion included potential follow up diagnostic testing for abnormal scans. yes Discussion included meaning and risk of over diagnosis. yes Discussion included meaning and risk of False Positives. yes Discussion included meaning of total radiation exposure. yes  Counseling Included: Importance of adherence to annual lung cancer LDCT screening. yes Impact of comorbidities on ability to participate in the program. yes Ability and willingness to under diagnostic treatment. yes  Smoking Cessation Counseling: Current Smokers:  Discussed importance of smoking cessation. yes Information about tobacco cessation classes and interventions provided to patient. yes Patient provided with "ticket" for LDCT Scan. yes Symptomatic Patient. no  Counseling Diagnosis  Code: Tobacco Use Z72.0 Asymptomatic Patient yes  Counseling (Intermediate counseling: > three minutes counseling) P3790 Former Smokers:  Discussed the importance of maintaining cigarette abstinence. yes Diagnosis Code: Personal History of Nicotine Dependence. W40.973 Information about tobacco cessation classes and interventions provided to patient. Yes Patient provided with "ticket" for LDCT Scan. yes Written Order for Lung Cancer Screening with LDCT placed in Epic. Yes (CT Chest Lung Cancer Screening Low Dose W/O CM) [id number] Z12.2-Screening of respiratory organs Z87.891-Personal history of nicotine dependence  I spent 25 minutes of face to face time with Victoria Williamson discussing the risks and benefits of lung cancer screening. We viewed a power point together that explained in detail the above noted topics. We took the time to pause the power point at intervals to allow for questions to be asked and answered to ensure understanding. We discussed that she had taken the single most powerful action possible to decrease her risk of developing lung cancer when she quit smoking. I counseled her to remain smoke free, and to contact me if she ever had the desire to smoke again so that I can provide resources and tools to help support the effort to remain smoke free. We discussed the time and location of the scan, and that either  Doroteo Glassman RN or I will call with the results within  24-48 hours of receiving them. She has my card and contact information in the event she needs to speak with me, in addition to a copy of the power point we reviewed as a resource. She verbalized understanding of all of the above and had no further questions upon leaving the office.     I explained to the patient that there has been a high incidence of coronary artery disease noted  on these exams. I explained that this is a non-gated exam therefore degree or severity cannot be determined. This patient is not on statin  therapy. I have asked the patient to follow-up with their PCP regarding any incidental finding of coronary artery disease and management with diet or medication as they feel is clinically indicated. The patient verbalized understanding of the above and had no further questions.     Magdalen Spatz, NP 05/15/2021

## 2021-05-15 NOTE — Patient Instructions (Signed)
Thank you for participating in the [facility] Scre[facility]. It was our pleasure to meet you today. We will call you with the results of your scan within the next few days. Your scan will be assigned a Lung RADS category score by the physicians reading the scans.  This Lung RADS score determines follow up scanning.  See below for description of categories, and follow up screening recommendations. We will be in touch to schedule your follow up screening annually or based on recommendations of our providers. We will fax a copy of your scan results to your Primary Care Physician, or the physician who referred you to the program, to ensure they have the results. Please call the office if you have any questions or concerns regarding your scanning experience or results.  Our office number is [phone]. Please speak with Denise Phelps, RN. She is our Lung Cancer Screening RN. If she is unavailable when you call, please have the office staff send her a message. She will return your call at her earliest convenience. Remember, if your scan is normal, we will scan you annually as long as you continue to meet the criteria for the program. (Age 54-77, Current smoker or smoker who has quit within the last 15 years). If you are a smoker, remember, quitting is the single most powerful action that you can take to decrease your risk of lung cancer and other pulmonary, breathing related problems. We know quitting is hard, and we are here to help.  Please let us know if there is anything we can do to help you meet your goal of quitting. If you are a former smoker, congratulations. We are proud of you! Remain smoke free! Remember you can refer friends or family members through the number above.  We will screen them to make sure they meet criteria for the program. Thank you for helping us take better care of you by participating in Lung Screening.  Lung RADS Categories:  Lung RADS 1: no nodules  or definitely non-concerning nodules.  Recommendation is for a repeat annual scan in 12 months.  Lung RADS 2:  nodules that are non-concerning in appearance and behavior with a very low likelihood of becoming an active cancer. Recommendation is for a repeat annual scan in 12 months.  Lung RADS 3: nodules that are probably non-concerning , includes nodules with a low likelihood of becoming an active cancer.  Recommendation is for a 6-month repeat screening scan. Often noted after an upper respiratory illness. We will be in touch to make sure you have no questions, and to schedule your 6-month scan.  Lung RADS 4 A: nodules with concerning findings, recommendation is most often for a follow up scan in 3 months or additional testing based on our provider's assessment of the scan. We will be in touch to make sure you have no questions and to schedule the recommended 3 month follow up scan.  Lung RADS 4 B:  indicates findings that are concerning. We will be in touch with you to schedule additional diagnostic testing based on our provider's  assessment of the scan.   

## 2021-05-15 NOTE — Telephone Encounter (Signed)
Spoke with patient. Advised for better assessment suggest in person visit. Dr Einar Pheasant had an appointment at 4 pm today and patient is scheduled to be seen at that time.

## 2021-05-15 NOTE — Progress Notes (Signed)
Subjective:     Victoria Williamson is a 55 y.o. female presenting for Back Pain (Started 2017. Gradually getting worse. Had an MRI in March 2021. Has not seen a specialist.)     Back Pain   #Back pain - had a guided steroid injection hte past - was getting cortisone shots but skin thinned - initially started in 2017 - flare up started on 05/13/2021 after cleaning the house - will also get symptoms with picking up a box - limiting her ability to work - has to sit down, maintain butterflies  - mychart message in 04/2021 -  5 day course of prednisone - no improvement with tylenol - no issues with bleeding - on elliquis - pain in the lower back near top of waist/buttock and travels up the back - does not get any radiating symptoms down the legs - has never done physical therapy - tries to do home stretches - worse with walking on the treadmill   No loss of control of bowel or bladder  MRI 10/08/2019 - no herniated disc and degenerative changes  Review of Systems  Musculoskeletal:  Positive for back pain.    Social History   Tobacco Use  Smoking Status Former   Packs/day: 1.00   Years: 36.00   Pack years: 36.00   Types: Cigarettes   Quit date: 12/29/2019   Years since quitting: 1.3  Smokeless Tobacco Never        Objective:    BP Readings from Last 3 Encounters:  05/15/21 114/74  05/04/21 (!) 152/87  04/25/21 132/73   Wt Readings from Last 3 Encounters:  05/15/21 219 lb (99.3 kg)  05/04/21 218 lb (98.9 kg)  04/25/21 218 lb (98.9 kg)    BP 114/74 (BP Location: Left Arm, Patient Position: Sitting, Cuff Size: Large)   Pulse 86   Temp (!) 97.3 F (36.3 C)   Ht 5\' 5"  (1.651 m)   Wt 219 lb (99.3 kg)   SpO2 98%   BMI 36.44 kg/m    Physical Exam Constitutional:      General: She is not in acute distress.    Appearance: She is well-developed. She is not diaphoretic.  HENT:     Right Ear: External ear normal.     Left Ear: External ear normal.  Eyes:      Conjunctiva/sclera: Conjunctivae normal.  Cardiovascular:     Rate and Rhythm: Normal rate.  Pulmonary:     Effort: Pulmonary effort is normal.  Musculoskeletal:     Cervical back: Neck supple.     Comments: Back:  Inspection: no step offs Palpation: TTP along the lumber spine and left Paraspinous muscles ROM: flexion/extension limited 2/2 to pain, also pain with right rotation and flexion Strength: normal LE strength Straight leg raise: negative b/l  Skin:    General: Skin is warm and dry.     Capillary Refill: Capillary refill takes less than 2 seconds.  Neurological:     Mental Status: She is alert. Mental status is at baseline.     Deep Tendon Reflexes: Reflexes normal.  Psychiatric:        Mood and Affect: Mood normal.        Behavior: Behavior normal.          Assessment & Plan:   Problem List Items Addressed This Visit       Other   Chronic bilateral low back pain without sciatica - Primary    No signs of nerve compression  today and MRI 2021 with DDD but no compression. Discussed that with recurrent symptoms PT and weight loss will probably be most effective at reducing symptoms. She is not sure she can afford PT. Will also do 5 days of prednisone as this was effective at the beginning of month to reduce symptoms. Discussed risks with apixaban.       Relevant Medications   predniSONE (DELTASONE) 20 MG tablet   Other Relevant Orders   Ambulatory referral to Physical Therapy     Return if symptoms worsen or fail to improve.  Lesleigh Noe, MD  This visit occurred during the SARS-CoV-2 public health emergency.  Safety protocols were in place, including screening questions prior to the visit, additional usage of staff PPE, and extensive cleaning of exam room while observing appropriate contact time as indicated for disinfecting solutions.

## 2021-05-17 ENCOUNTER — Other Ambulatory Visit (HOSPITAL_COMMUNITY): Payer: Self-pay

## 2021-05-19 ENCOUNTER — Other Ambulatory Visit: Payer: Self-pay

## 2021-05-19 ENCOUNTER — Ambulatory Visit
Admission: RE | Admit: 2021-05-19 | Discharge: 2021-05-19 | Disposition: A | Discharge status: Home / Self Care | Payer: BC Managed Care – PPO | Source: Ambulatory Visit | Attending: Acute Care | Admitting: Acute Care

## 2021-05-19 DIAGNOSIS — Z87891 Personal history of nicotine dependence: Secondary | ICD-10-CM

## 2021-05-25 ENCOUNTER — Encounter: Payer: Self-pay | Admitting: *Deleted

## 2021-05-25 ENCOUNTER — Other Ambulatory Visit: Payer: Self-pay | Admitting: Acute Care

## 2021-05-25 DIAGNOSIS — F17210 Nicotine dependence, cigarettes, uncomplicated: Secondary | ICD-10-CM

## 2021-05-29 ENCOUNTER — Other Ambulatory Visit: Payer: Self-pay | Admitting: Nurse Practitioner

## 2021-05-29 ENCOUNTER — Encounter: Payer: Self-pay | Admitting: Nurse Practitioner

## 2021-05-30 ENCOUNTER — Encounter: Payer: Self-pay | Admitting: Nurse Practitioner

## 2021-05-30 ENCOUNTER — Other Ambulatory Visit: Payer: Self-pay | Admitting: Nurse Practitioner

## 2021-05-31 ENCOUNTER — Other Ambulatory Visit: Payer: Self-pay | Admitting: Nurse Practitioner

## 2021-05-31 DIAGNOSIS — G8929 Other chronic pain: Secondary | ICD-10-CM

## 2021-05-31 MED ORDER — ARIPIPRAZOLE 10 MG PO TABS: 5.0000 mg | ORAL_TABLET | Freq: Every day | ORAL | 1 refills | Status: DC

## 2021-05-31 MED ORDER — MIRTAZAPINE 15 MG PO TABS: 15.0000 mg | ORAL_TABLET | Freq: Every day | ORAL | 1 refills | Status: DC

## 2021-05-31 NOTE — Telephone Encounter (Signed)
Patient states that she got an appointment with [facility] But I think that is for PT. I am not sure if one of the providers is going to see her or not. I do not think it would be a bad idea to be evaluated by an ortho provider since she is having these issues

## 2021-07-12 ENCOUNTER — Telehealth: Payer: Self-pay | Admitting: Cardiology

## 2021-07-12 NOTE — Telephone Encounter (Signed)
   [facility] Pre-operative Risk Assessment    Patient Name: Victoria Williamson  DOB: [birth year] MRN: [id number]  HEARTCARE STAFF:  - IMPORTANT!!!!!! Under Visit Info/Reason for Call, type in Other and utilize the format Clearance MM/DD/YY or Clearance TBD. Do not use dashes or single digits. - Please review there is not already an duplicate clearance open for this procedure. - If request is for dental extraction, please clarify the # of teeth to be extracted. - If the patient is currently at the dentist's office, call Pre-Op Callback Staff (MA/nurse) to input urgent request.  - If the patient is not currently in the dentist office, please route to the Pre-Op pool.  Request for surgical clearance:  What type of surgery is being performed? Lumbar epidural steroid injection   When is this surgery scheduled? TBD  What type of clearance is required (medical clearance vs. Pharmacy clearance to hold med vs. Both)? both  Are there any medications that need to be held prior to surgery and how long? Eliquis discontinue 3 days prior and resume 24 hours after   Practice name and name of physician performing surgery? [facility]   What is the office phone number? [phone]   7.   What is the office fax number? [phone] or [phone]  8.   Anesthesia type (None, local, MAC, general) ? Not listed    Ace Gins 07/12/2021, 2:41 PM  _________________________________________________________________   (provider comments below)

## 2021-07-13 ENCOUNTER — Encounter: Payer: Self-pay | Admitting: Cardiology

## 2021-07-13 NOTE — Telephone Encounter (Signed)
   Patient Name: Victoria Williamson  DOB: [birth year] MRN: [id number]  Primary Cardiologist: Kate Sable, MD  Chart reviewed as part of pre-operative protocol coverage. Last OV 03/2021. Will route to pharm for input on anticoag then patient will need call.   Charlie Pitter, PA-C 07/13/2021, 12:59 PM

## 2021-07-13 NOTE — Telephone Encounter (Signed)
Patient with diagnosis of afib on Eliquis for anticoagulation.    Procedure: lumbar ESI Date of procedure: TBD  CHA2DS2-VASc Score = 2  This indicates a 2.2% annual risk of stroke. The patient's score is based upon: CHF History: 0 HTN History: 1 Diabetes History: 0 Stroke History: 0 Vascular Disease History: 0 Age Score: 0 Gender Score: 1   CrCl 60mL/min using adjusted body weight Platelet count 246K  Per office protocol, patient can hold Eliquis for 3 days prior to procedure and resume 24 hours after as requested.

## 2021-07-14 NOTE — Telephone Encounter (Signed)
   Name: Victoria Williamson  DOB: 1966/02/06  MRN: [id number]   Primary Cardiologist: Kate Sable, MD  Chart reviewed as part of pre-operative protocol coverage. Patient was contacted 07/14/2021 in reference to pre-operative risk assessment for pending surgery as outlined below.  Mao Bobbijo Holst was last seen on 03/2021 by Dr. Garen Lah, also follows with Dr. Quentin Ore. I reached out to patient for update on how she is doing. The patient affirms she has been doing well without any new cardiac symptoms. Therefore, based on ACC/AHA guidelines, the patient would be at acceptable risk for the planned procedure without further cardiovascular testing. The patient was advised that if she develops new symptoms prior to surgery to contact our office to arrange for a follow-up visit, and she verbalized understanding.  Regarding anticoagulation, Per pharmD/office protocol, patient can hold Eliquis for 3 days prior to procedure and resume 24 hours after as requested.  I will route this recommendation to the requesting party via Epic fax function and remove from pre-op pool. Please call with questions.  Charlie Pitter, PA-C 07/14/2021, 10:09 AM

## 2021-07-17 ENCOUNTER — Encounter: Payer: Self-pay | Admitting: Cardiology

## 2021-07-17 ENCOUNTER — Telehealth: Payer: Self-pay

## 2021-07-17 NOTE — Progress Notes (Signed)
Cardiology Office Note    Date:  07/18/2021   ID:  Victoria Williamson, DOB [birth year], MRN [id number]  PCP:  Michela Pitcher, NP  Cardiologist:  Kate Sable, MD  Electrophysiologist:  Vickie Epley, MD   Chief Complaint: Lower extremity swelling  History of Present Illness:   Victoria Williamson is a 55 y.o. female with history of persistent A. fib status post PVI in 07/2020, SVT, HTN, ectatic ascending aorta, COVID in 08/2020, and obesity who presents for evaluation of swelling.  Prior documentation indicates she underwent LHC in 2014 and was told she had normal coronary arteries.  She was initially diagnosed with A. fib in 09/2019 and had been maintained on Eliquis and flecainide, though the latter led to xerostomia and difficulty sleeping leading to its discontinuation.  Echo in 12/2019 demonstrated an EF of 55% with grade 2 diastolic dysfunction.  PET MPI in 01/2020 showed no evidence of ischemia.  She was subsequently evaluated by EP and underwent A. fib PVI on 07/15/2020.  With palpitations, she underwent outpatient cardiac monitoring which showed an average heart rate of 79 bpm with a range of 54 to 190 bpm, 7 episodes of SVT with the longest episode lasting 1 hour and 33 minutes with an average rate of 128 bpm and no evidence of A. fib.  Given this, she was initiated on Toprol-XL.  She was last seen in the office in 03/2021 with her weight up 14 pounds when compared to her visit in our office in 11/2020.  She reported lower extremity swelling.  She was maintaining sinus rhythm.  She was advised to take Lasix 20 mg daily as needed for edema.  She contacted our office on 07/17/2021 noting a 2-week history of increasing shortness of breath and swelling.  She had been taking Lasix 10 mg daily.  Given symptoms, appointment was scheduled for today.  She comes in today noting increased lower extremity swelling that has been largely present since September, though appears to be getting  worse over the past couple of weeks.  Historically, she does have intermittent lower extremity swelling that has typically resolved with as needed furosemide and improves after laying supine overnight.  However, dating back to September her swelling has been persistent, despite taking furosemide 20 mg daily.  She denies any chest pain, dyspnea, palpitations, dizziness, presyncope, or syncope.  No orthopnea.  She does report being on several courses of prednisone for back pain in this time span, most recently last month.  She also reports having been prescribed tizanidine and with this had an episode of hypotension/bradycardia following her first dose.  With this, there was some chest discomfort.  She has not taken any of this muscle relaxer since and has not had any further episodes of hypotension, bradycardia, or chest discomfort.  Her weight today is up 4 pounds when compared to her visit in 03/2021.  She does not add salt to food and watches her salt intake.  She drinks approximately 2 L of fluid daily.   Labs independently reviewed: 03/2021 - TC 164, TG 92, HDL 49, LDL 96, A1c 5.6, BUN 9, serum creatinine 0.88, potassium 3.9, albumin 4.2, AST normal, ALT 31, Hgb 13.3, PLT 246 11/2020 - TSH normal  Past Medical History:  Diagnosis Date   Anxiety    Arthritis    Chest pain    normal coronary arteries by cardiac cath oct 29,2014   Colon polyp    Depression    Dysautonomia ([place]) 12/18/2013  Fatty liver    Heart disease    History of blood transfusion    Hypertension    PAF (paroxysmal atrial fibrillation) ([place])    Skin cancer, basal cell    Thyroid nodule     Past Surgical History:  Procedure Laterality Date   ATRIAL FIBRILLATION ABLATION N/A 07/15/2020   Procedure: ATRIAL FIBRILLATION ABLATION;  Surgeon: Vickie Epley, MD;  Location: [facility];  Service: Cardiovascular;  Laterality: N/A;   btl     CARDIOVERSION N/A 05/11/2020   Procedure: CARDIOVERSION;  Surgeon:  Kate Sable, MD;  Location: [facility];  Service: Cardiovascular;  Laterality: N/A;   CARPAL TUNNEL RELEASE Left 06/23/2020   Procedure: CARPAL TUNNEL RELEASE;  Surgeon: [name], MD;  Location: [facility];  Service: Orthopedics;  Laterality: Left;  IV REGIONAL FOREARM BLOCK   ENDOMETRIAL ABLATION     right carpal tunel      Current Medications: Current Meds  Medication Sig   apixaban (ELIQUIS) 5 MG TABS tablet Take 1 tablet (5 mg total) by mouth 2 (two) times daily.   ARIPiprazole (ABILIFY) 10 MG tablet Take 0.5 tablets (5 mg total) by mouth daily.   Cholecalciferol ([employer] VIT D 5000 HIGH-POTENCY PO) Take by mouth daily in the afternoon.   CHOLECALCIFEROL PO Take by mouth. Takes on occassion   metoprolol succinate (TOPROL-XL) 100 MG 24 hr tablet Take 1 tablet (100 mg total) by mouth daily. Take with or immediately following a meal.   mirtazapine (REMERON) 15 MG tablet Take 1 tablet (15 mg total) by mouth at bedtime.   torsemide (DEMADEX) 20 MG tablet Take 1 tablet (20 mg total) by mouth daily.   [DISCONTINUED] furosemide (LASIX) 20 MG tablet Take 1 tablet (20 mg total) by mouth daily as needed.    Allergies:   Codeine, Meloxicam, and Paroxetine hcl   Social History   Socioeconomic History   Marital status: Married    Spouse name: Not on file   Number of children: Not on file   Years of education: Not on file   Highest education level: Not on file  Occupational History   Not on file  Tobacco Use   Smoking status: Former    Packs/day: 1.00    Years: 36.00    Pack years: 36.00    Types: Cigarettes    Quit date: 12/29/2019    Years since quitting: 1.5   Smokeless tobacco: Never  Vaping Use   Vaping Use: Every day  Substance and Sexual Activity   Alcohol use: Not Currently   Drug use: Never   Sexual activity: Yes    Partners: Male  Other Topics Concern   Not on file  Social History Narrative   Lives at home with husband   Social Determinants of  Health   Financial Resource Strain: Not on file  Food Insecurity: Not on file  Transportation Needs: Not on file  Physical Activity: Not on file  Stress: Not on file  Social Connections: Not on file     Family History:  The patient's family history includes Alcohol abuse in her father; Arthritis in her mother; Breast cancer (age of onset: 30) in her maternal aunt; Breast cancer (age of onset: 49) in her maternal grandmother; COPD in her mother; Depression in her paternal grandmother; Diabetes in her mother and another family member; Hearing loss in her father; Heart attack in her brother, father, and paternal grandfather; Heart disease in her father, mother, and paternal grandfather; High  Cholesterol in her father and mother; Hypertension in her father and mother; Kidney disease in her mother.  ROS:   Review of Systems  Constitutional:  Positive for malaise/fatigue. Negative for chills, diaphoresis, fever and weight loss.  HENT:  Negative for congestion.   Eyes:  Negative for discharge and redness.  Respiratory:  Negative for cough, sputum production, shortness of breath and wheezing.   Cardiovascular:  Positive for chest pain and leg swelling. Negative for palpitations, orthopnea, claudication and PND.       Isolated episode of chest discomfort following initial dose of tizanidine with noted hypotension at that time  Gastrointestinal:  Negative for abdominal pain, blood in stool, heartburn and melena.       Some abdominal bloating  Musculoskeletal:  Negative for falls and myalgias.  Skin:  Negative for rash.  Neurological:  Negative for dizziness, tingling, tremors, sensory change, speech change, focal weakness, loss of consciousness and weakness.  Endo/Heme/Allergies:  Does not bruise/bleed easily.  Psychiatric/Behavioral:  Negative for substance abuse. The patient is not nervous/anxious.   All other systems reviewed and are negative.   EKGs/Labs/Other Studies Reviewed:    Studies  reviewed were summarized above. The additional studies were reviewed today:  Zio patch 10/2020: HR 54-190bpm, average 79bpm. 7 episodes of SVT, longest lasting 1h76min with an average rate of 128bpm. Review of rhythm strip shows P wave preceding each QRS suggestive of AT. No evidence of atrial fibrillation. __________  2D echo 05/2020: 1. Left ventricular ejection fraction, by estimation, is 60 to 65%. The  left ventricle has normal function. The left ventricle has no regional  wall motion abnormalities. Left ventricular diastolic parameters are  indeterminate.   2. Right ventricular systolic function is normal. The right ventricular  size is normal. There is normal pulmonary artery systolic pressure.   3. The mitral valve is normal in structure. Trivial mitral valve  regurgitation. No evidence of mitral stenosis.   4. The aortic valve has an indeterminant number of cusps. Aortic valve  regurgitation is not visualized. No aortic stenosis is present.   5. The inferior vena cava is normal in size with greater than 50%  respiratory variability, suggesting right atrial pressure of 3 mmHg.    EKG:  EKG is ordered today.  The EKG ordered today demonstrates NSR, 66 bpm, no acute ST-T changes  Recent Labs: 11/10/2020: TSH 4.13 03/17/2021: ALT 31; BUN 9; Creat 0.88; Hemoglobin 13.3; Platelets 246; Potassium 3.9; Sodium 141  Recent Lipid Panel    Component Value Date/Time   CHOL 164 03/17/2021 1543   TRIG 92 03/17/2021 1543   HDL 49 (L) 03/17/2021 1543   CHOLHDL 3.3 03/17/2021 1543   LDLCALC 96 03/17/2021 1543    PHYSICAL EXAM:    VS:  BP 130/84 (BP Location: Left Arm, Patient Position: Sitting, Cuff Size: Large)   Pulse 66   Ht 5\' 5"  (1.651 m)   Wt 224 lb (101.6 kg)   SpO2 97%   BMI 37.28 kg/m   BMI: Body mass index is 37.28 kg/m.  Physical Exam Vitals reviewed.  Constitutional:      Appearance: She is well-developed.  HENT:     Head: Normocephalic and atraumatic.  Eyes:      General:        Right eye: No discharge.        Left eye: No discharge.  Neck:     Vascular: No JVD.  Cardiovascular:     Rate and Rhythm: Normal rate and regular  rhythm.     Pulses:          Posterior tibial pulses are 2+ on the right side and 2+ on the left side.     Heart sounds: Normal heart sounds, S1 normal and S2 normal. Heart sounds not distant. No midsystolic click and no opening snap. No murmur heard.   No friction rub.  Pulmonary:     Effort: Pulmonary effort is normal. No respiratory distress.     Breath sounds: Normal breath sounds. No decreased breath sounds, wheezing or rales.  Chest:     Chest wall: No tenderness.  Abdominal:     General: There is no distension.     Palpations: Abdomen is soft.     Tenderness: There is no abdominal tenderness.  Musculoskeletal:     Cervical back: Normal range of motion.     Right lower leg: Edema present.     Left lower leg: Edema present.     Comments: 1+ bilateral nonpitting edema to the midshin  Skin:    General: Skin is warm and dry.     Nails: There is no clubbing.  Neurological:     Mental Status: She is alert and oriented to person, place, and time.  Psychiatric:        Speech: Speech normal.        Behavior: Behavior normal.        Thought Content: Thought content normal.        Judgment: Judgment normal.    Wt Readings from Last 3 Encounters:  07/18/21 224 lb (101.6 kg)  05/19/21 219 lb (99.3 kg)  05/15/21 219 lb (99.3 kg)     ASSESSMENT & PLAN:   Acute on chronic HFpEF: Her weight is up 4 pounds today when compared to her visit in 03/2021 which was up 14 pounds at that time when compared to her visit in 11/2020.  Some of her lower extremity swelling may be in the context of dependent edema, though I suspect some of this fluid retention is related to multiple rounds of prednisone.  Discontinue furosemide, start torsemide 20 mg daily.  She has KCl at home and will call us to tell us what the mEq is.  Check BMP in 1  week.  Schedule echo.  Recommend leg elevation and compression stockings.  Recommend trying to minimize systemic steroid use for back pain as much as possible in an effort to minimize fluid retention.  CHF education.  PAF: Status post PVI in 07/2020.  Maintaining sinus rhythm.  She remains on Toprol-XL.  CHA2DS2-VASc at least 2 (HTN, sex category).  She remains on apixaban without symptoms concerning for bleeding.  Follow-up with EP.  Paroxysmal SVT: Quiescent.  Maintaining sinus rhythm with Toprol-XL.  Follow-up with EP.  Ectatic ascending aorta: CT imaging in 07/2020 demonstrated an ectatic aorta measuring 4 cm in diameter with recommendation for annual imaging via CTA or MRA.  Plan to schedule follow-up CTA aorta at next visit.  Optimal blood pressure control is recommended.  Escalate diuresis as outlined above.  Otherwise, she remains on Toprol-XL.  HTN: Blood pressure is mildly elevated in the office today.  Escalate diuresis as above with continuation of Toprol-XL.    Disposition: F/u with Dr. Garen Lah or an APP in 4 weeks, and EP as directed.   Medication Adjustments/Labs and Tests Ordered: Current medicines are reviewed at length with the patient today.  Concerns regarding medicines are outlined above. Medication changes, Labs and Tests ordered today are summarized  above and listed in the Patient Instructions accessible in Encounters.   Signed, [name], PA-C 07/18/2021 10:56 AM     [facility] [address] [place] [address] [place], [place] [address] [phone]

## 2021-07-18 ENCOUNTER — Other Ambulatory Visit: Payer: Self-pay

## 2021-07-18 ENCOUNTER — Encounter: Payer: Self-pay | Admitting: Physician Assistant

## 2021-07-18 ENCOUNTER — Encounter: Payer: Self-pay | Admitting: *Deleted

## 2021-07-18 ENCOUNTER — Ambulatory Visit (INDEPENDENT_AMBULATORY_CARE_PROVIDER_SITE_OTHER): Payer: BC Managed Care – PPO | Admitting: Physician Assistant

## 2021-07-18 VITALS — BP 130/84 | HR 66 | Ht 65.0 in | Wt 224.0 lb

## 2021-07-18 DIAGNOSIS — I5033 Acute on chronic diastolic (congestive) heart failure: Secondary | ICD-10-CM | POA: Diagnosis not present

## 2021-07-18 DIAGNOSIS — I10 Essential (primary) hypertension: Secondary | ICD-10-CM | POA: Diagnosis not present

## 2021-07-18 DIAGNOSIS — I77810 Thoracic aortic ectasia: Secondary | ICD-10-CM | POA: Diagnosis not present

## 2021-07-18 DIAGNOSIS — I471 Supraventricular tachycardia: Secondary | ICD-10-CM | POA: Diagnosis not present

## 2021-07-18 DIAGNOSIS — I480 Paroxysmal atrial fibrillation: Secondary | ICD-10-CM | POA: Diagnosis not present

## 2021-07-18 MED ORDER — TORSEMIDE 20 MG PO TABS: 20.0000 mg | ORAL_TABLET | Freq: Every day | ORAL | 3 refills | Status: DC

## 2021-07-18 NOTE — Patient Instructions (Addendum)
Medication Instructions:  Your physician has recommended you make the following change in your medication:   STOP Furosemide START Torsemide 20 mg once a day Call us with potassium information.  *If you need a refill on your cardiac medications before your next appointment, please call your pharmacy*   Lab Work: BMET in one week here in our office.   If you have labs (blood work) drawn today and your tests are completely normal, you will receive your results only by: [place] (if you have MyChart) OR A paper copy in the mail If you have any lab test that is abnormal or we need to change your treatment, we will call you to review the results.   Testing/Procedures: Your physician has requested that you have an echocardiogram. Patient should still follow up even if echocardiogram has not been done at that time.   Echocardiography is a painless test that uses sound waves to create images of your heart. It provides your doctor with information about the size and shape of your heart and how well your heart's chambers and valves are working. This procedure takes approximately one hour. There are no restrictions for this procedure.    Follow-Up: At [facility], you and your health needs are our priority.  As part of our continuing mission to provide you with exceptional heart care, we have created designated Provider Care Teams.  These Care Teams include your primary Cardiologist (physician) and Advanced Practice Providers (APPs -  Physician Assistants and Nurse Practitioners) who all work together to provide you with the care you need, when you need it.   Your next appointment:   1 month(s)  The format for your next appointment:   In Person  Provider:   Kate Sable, MD or [name], PA-C

## 2021-07-19 NOTE — Telephone Encounter (Signed)
Returned the call back to [facility], they have been made aware that the pt can pursue with her procedure.  Re-faxed clearance over.

## 2021-07-19 NOTE — Telephone Encounter (Signed)
[facility] requesting clearance update as patient mentioned having cardiac issues.   See 12/13 in [place] office

## 2021-07-19 NOTE — Telephone Encounter (Signed)
I spoke with [name] PA-C this AM who saw patient yesterday in clinic. He affirms that the prior clearance still stands, that she may proceed with her steroid injection as previously recommended and may hold Eliquis for 3 days prior to procedure and resume 24 hours after as requested. He is assisting in her care of heart failure but does not feel this impacts her clearance for the injection. We appreciate them checking though. Will route to callback team to call [facility] to let them know.

## 2021-07-20 ENCOUNTER — Other Ambulatory Visit: Payer: Self-pay | Admitting: *Deleted

## 2021-07-20 NOTE — Telephone Encounter (Signed)
Late Entry: 07/17/21 4:43  Spoke with patient regarding Victoria Williamson MyChart message as copied and pasted below. Scheduled patient with [name] for 07/18/21. Patient was grateful for the follow up.   Hi Dr. Garen Lah, I have been swollen for 2 weeks.  I'm taking the lasix every day. It's 10mg . I get winded easily. What should I do? I'm supposed to get a spinal injection soon but don't know if this will be a problem. If you could, please let me know what I should do.  Thank You  Victoria Williamson

## 2021-07-25 ENCOUNTER — Telehealth: Payer: Self-pay

## 2021-07-25 ENCOUNTER — Emergency Department: Payer: BC Managed Care – PPO

## 2021-07-25 ENCOUNTER — Other Ambulatory Visit: Payer: Self-pay

## 2021-07-25 ENCOUNTER — Emergency Department
Admission: EM | Admit: 2021-07-25 | Discharge: 2021-07-25 | Disposition: A | Discharge status: Home / Self Care | Payer: BC Managed Care – PPO | Attending: Emergency Medicine | Admitting: Emergency Medicine

## 2021-07-25 ENCOUNTER — Encounter: Payer: Self-pay | Admitting: Emergency Medicine

## 2021-07-25 ENCOUNTER — Other Ambulatory Visit (INDEPENDENT_AMBULATORY_CARE_PROVIDER_SITE_OTHER): Payer: BC Managed Care – PPO

## 2021-07-25 DIAGNOSIS — I110 Hypertensive heart disease with heart failure: Secondary | ICD-10-CM | POA: Insufficient documentation

## 2021-07-25 DIAGNOSIS — R0789 Other chest pain: Secondary | ICD-10-CM | POA: Insufficient documentation

## 2021-07-25 DIAGNOSIS — I10 Essential (primary) hypertension: Secondary | ICD-10-CM

## 2021-07-25 DIAGNOSIS — R079 Chest pain, unspecified: Secondary | ICD-10-CM

## 2021-07-25 DIAGNOSIS — R002 Palpitations: Secondary | ICD-10-CM | POA: Diagnosis not present

## 2021-07-25 DIAGNOSIS — E876 Hypokalemia: Secondary | ICD-10-CM | POA: Insufficient documentation

## 2021-07-25 DIAGNOSIS — I509 Heart failure, unspecified: Secondary | ICD-10-CM | POA: Insufficient documentation

## 2021-07-25 DIAGNOSIS — Z85828 Personal history of other malignant neoplasm of skin: Secondary | ICD-10-CM | POA: Diagnosis not present

## 2021-07-25 DIAGNOSIS — Z79899 Other long term (current) drug therapy: Secondary | ICD-10-CM | POA: Insufficient documentation

## 2021-07-25 DIAGNOSIS — Z7901 Long term (current) use of anticoagulants: Secondary | ICD-10-CM | POA: Diagnosis not present

## 2021-07-25 DIAGNOSIS — Z20822 Contact with and (suspected) exposure to covid-19: Secondary | ICD-10-CM | POA: Diagnosis not present

## 2021-07-25 DIAGNOSIS — Z86010 Personal history of colonic polyps: Secondary | ICD-10-CM | POA: Diagnosis not present

## 2021-07-25 DIAGNOSIS — Z87891 Personal history of nicotine dependence: Secondary | ICD-10-CM | POA: Diagnosis not present

## 2021-07-25 DIAGNOSIS — I480 Paroxysmal atrial fibrillation: Secondary | ICD-10-CM | POA: Diagnosis not present

## 2021-07-25 DIAGNOSIS — Z8616 Personal history of COVID-19: Secondary | ICD-10-CM | POA: Diagnosis not present

## 2021-07-25 LAB — CBC WITH DIFFERENTIAL/PLATELET
Abs Immature Granulocytes: 0.02 10*3/uL (ref 0.00–0.07)
Basophils Absolute: 0.1 10*3/uL (ref 0.0–0.1)
Basophils Relative: 1 %
Eosinophils Absolute: 0.3 10*3/uL (ref 0.0–0.5)
Eosinophils Relative: 3 %
HCT: 42.4 % (ref 36.0–46.0)
Hemoglobin: 14.7 g/dL (ref 12.0–15.0)
Immature Granulocytes: 0 %
Lymphocytes Relative: 39 %
Lymphs Abs: 3.7 10*3/uL (ref 0.7–4.0)
MCH: 31.1 pg (ref 26.0–34.0)
MCHC: 34.7 g/dL (ref 30.0–36.0)
MCV: 89.8 fL (ref 80.0–100.0)
Monocytes Absolute: 1 10*3/uL (ref 0.1–1.0)
Monocytes Relative: 10 %
Neutro Abs: 4.5 10*3/uL (ref 1.7–7.7)
Neutrophils Relative %: 47 %
Platelets: 263 10*3/uL (ref 150–400)
RBC: 4.72 MIL/uL (ref 3.87–5.11)
RDW: 11.9 % (ref 11.5–15.5)
WBC: 9.6 10*3/uL (ref 4.0–10.5)
nRBC: 0 % (ref 0.0–0.2)

## 2021-07-25 LAB — BASIC METABOLIC PANEL
Anion gap: 9 (ref 5–15)
BUN: 10 mg/dL (ref 6–20)
CO2: 30 mmol/L (ref 22–32)
Calcium: 8.8 mg/dL — ABNORMAL LOW (ref 8.9–10.3)
Chloride: 98 mmol/L (ref 98–111)
Creatinine, Ser: 1.03 mg/dL — ABNORMAL HIGH (ref 0.44–1.00)
GFR, Estimated: >60 mL/min (ref >60)
Glucose, Bld: 96 mg/dL (ref 70–99)
Potassium: 2.9 mmol/L — ABNORMAL LOW (ref 3.5–5.1)
Sodium: 137 mmol/L (ref 135–145)

## 2021-07-25 LAB — MAGNESIUM: Magnesium: 1.9 mg/dL (ref 1.7–2.4)

## 2021-07-25 LAB — TROPONIN I (HIGH SENSITIVITY)
Troponin I (High Sensitivity): 6 ng/L (ref <18)
Troponin I (High Sensitivity): 6 ng/L (ref <18)

## 2021-07-25 LAB — BRAIN NATRIURETIC PEPTIDE: B Natriuretic Peptide: 25.8 pg/mL (ref 0.0–100.0)

## 2021-07-25 LAB — TSH: TSH: 3.762 u[IU]/mL (ref 0.350–4.500)

## 2021-07-25 LAB — RESP PANEL BY RT-PCR (FLU A&B, COVID) ARPGX2
Influenza A by PCR: NEGATIVE
Influenza B by PCR: NEGATIVE
SARS Coronavirus 2 by RT PCR: NEGATIVE

## 2021-07-25 MED ORDER — POTASSIUM CHLORIDE CRYS ER 20 MEQ PO TBCR
40.0000 meq | EXTENDED_RELEASE_TABLET | Freq: Once | ORAL | Status: AC
  Administered 2021-07-25: 18:00:00 40 meq via ORAL
  Filled 2021-07-25: qty 2

## 2021-07-25 MED ORDER — METOPROLOL SUCCINATE ER 50 MG PO TB24
100.0000 mg | ORAL_TABLET | Freq: Every day | ORAL | Status: DC
  Administered 2021-07-25: 18:00:00 100 mg via ORAL
  Filled 2021-07-25: qty 2

## 2021-07-25 NOTE — ED Triage Notes (Signed)
Pt to ED via POV with c/o feeling tired for the last few days, she was at work and felt fatigued, she came to get some blood work, she ask them to take her VS and they found that her HR was 146. She was sent over here to be evaluated

## 2021-07-25 NOTE — ED Notes (Signed)
Just had blood drawn at [facility].

## 2021-07-25 NOTE — ED Provider Notes (Signed)
[facility] Emergency Department Provider Note  ____________________________________________   Event Date/Time   First MD Initiated Contact with Patient 07/25/21 1626     (approximate)  I have reviewed the triage vital signs and the nursing notes.   HISTORY  Chief Complaint Palpitations   HPI Victoria Williamson is a 54 y.o. female with a past medical history of A. fib on Eliquis and metoprolol s/p PVI in 2021, ectatic ascending aorta, , arthritis, anxiety, depression, dysautonomia, HTN and some diastolic dysfunction on echocardiogram performed in 2021 having recently had her Lasix switched to torsemide about a week and a half ago who presents for assessment of constellation of symptoms including exertional chest pressure, exertional shortness of breath, some nausea and palpitations.  Patient states he normally has some shortness of breath this is not any different than usual but she does not normally have chest pressure feeling her heart is racing when she is walking or doing chores around her house.  She states that the swelling in her legs not needed with unusual while she sleeps in recliner she is not able to lie flat because of low pain in her back and not specifically to the shortness of breath.  She states she is currently chest pain-free resting in the bed.  She denies any headache, earache, sore throat, fevers, lightheadedness or dizziness, abdominal pain, vomiting or diarrhea but does endorse some nausea.  Denies any urinary symptoms.  She states that she takes her metoprolol in the evening and does not take it during the day.  No other acute concerns at this time.  She is compliant with all her medications including her Eliquis.         Past Medical History:  Diagnosis Date   Anxiety    Arthritis    Chest pain    normal coronary arteries by cardiac cath oct 29,2014   Colon polyp    Depression    Dysautonomia ([place]) 12/18/2013   Fatty liver    Heart  disease    History of blood transfusion    Hypertension    PAF (paroxysmal atrial fibrillation) ([place])    Skin cancer, basal cell    Thyroid nodule     Patient Active Problem List   Diagnosis Date Noted   Chronic bilateral low back pain without sciatica 05/15/2021   COVID-19 04/25/2021   Class 2 severe obesity with serious comorbidity and body mass index (BMI) of 35.0 to 35.9 in adult [facility]) 03/17/2021   Bipolar depression ([place]) 03/17/2021   Encounter to establish care 03/17/2021   Vitamin D deficiency 03/17/2021   Skin lesion 03/17/2021   SVT (supraventricular tachycardia) ([place]) 11/23/2020   Arthritis    Carpal tunnel syndrome of left wrist 02/04/2020   Chronic heart failure with preserved ejection fraction ([place]) 01/11/2020   Hepatic steatosis 12/18/2019   High serum thyroid stimulating hormone (TSH) 12/18/2019   Lower extremity edema 12/17/2019   Neck swelling 12/17/2019   Paroxysmal atrial fibrillation ([place]) 12/17/2019   Lumbar radiculitis 01/16/2019   Tubular adenoma 08/22/2018   B12 deficiency 01/14/2018   Essential hypertension 05/19/2014   Gastroesophageal reflux disease without esophagitis 05/19/2014   Vasovagal syndrome 12/25/2013   Anxiety 12/20/2013   Dysautonomia ([place]) 12/18/2013    Past Surgical History:  Procedure Laterality Date   ATRIAL FIBRILLATION ABLATION N/A 07/15/2020   Procedure: ATRIAL FIBRILLATION ABLATION;  Surgeon: Vickie Epley, MD;  Location: [facility] [place] [facility];  Service: Cardiovascular;  Laterality: N/A;   btl  CARDIOVERSION N/A 05/11/2020   Procedure: CARDIOVERSION;  Surgeon: Kate Sable, MD;  Location: [facility] ORS;  Service: Cardiovascular;  Laterality: N/A;   CARPAL TUNNEL RELEASE Left 06/23/2020   Procedure: CARPAL TUNNEL RELEASE;  Surgeon: [name], MD;  Location: [facility];  Service: Orthopedics;  Laterality: Left;  IV REGIONAL FOREARM BLOCK   ENDOMETRIAL ABLATION     right carpal tunel      Prior to  Admission medications   Medication Sig Start Date End Date Taking? Authorizing Provider  apixaban (ELIQUIS) 5 MG TABS tablet Take 1 tablet (5 mg total) by mouth 2 (two) times daily. 11/02/20   Vickie Epley, MD  ARIPiprazole (ABILIFY) 10 MG tablet Take 0.5 tablets (5 mg total) by mouth daily. 05/31/21   Michela Pitcher, NP  Cholecalciferol ([employer] VIT D 5000 HIGH-POTENCY PO) Take by mouth daily in the afternoon.    [provider]  CHOLECALCIFEROL PO Take by mouth. Takes on [employer], Historical, MD  metoprolol succinate (TOPROL-XL) 100 MG 24 hr tablet Take 1 tablet (100 mg total) by mouth daily. Take with or immediately following a meal. 11/23/20   Vickie Epley, MD  mirtazapine (REMERON) 15 MG tablet Take 1 tablet (15 mg total) by mouth at bedtime. 05/31/21   Michela Pitcher, NP  pantoprazole (PROTONIX) 40 MG tablet Take 40 mg by mouth daily. Patient not taking: Reported on 07/18/2021 01/12/21   [provider]  potassium chloride (KLOR-CON) 10 MEQ tablet Take 1 tablet (10 mEq total) by mouth daily. 07/20/21   Dunn, Areta Haber, PA-C  torsemide (DEMADEX) 20 MG tablet Take 1 tablet (20 mg total) by mouth daily. 07/18/21 10/16/21  Rise Mu, PA-C    Allergies Codeine, Meloxicam, and Paroxetine hcl  Family History  Problem Relation Age of Onset   Heart disease Mother    Diabetes Mother    COPD Mother    Hypertension Mother    Arthritis Mother    High Cholesterol Mother    Kidney disease Mother    Heart attack Father    Alcohol abuse Father    Hearing loss Father    Heart disease Father    High Cholesterol Father    Hypertension Father    Heart attack Brother    Breast cancer Maternal Aunt 16   Breast cancer Maternal Grandmother 92   Depression Paternal Grandmother    Heart attack Paternal Grandfather    Heart disease Paternal Grandfather    Diabetes Other        family HX, unspecified    Social History Social History   Tobacco Use   Smoking  status: Former    Packs/day: 1.00    Years: 36.00    Pack years: 36.00    Types: Cigarettes    Quit date: 12/29/2019    Years since quitting: 1.5   Smokeless tobacco: Never  Vaping Use   Vaping Use: Every day  Substance Use Topics   Alcohol use: Not Currently   Drug use: Never    Review of Systems  Review of Systems  Constitutional:  Positive for malaise/fatigue. Negative for chills and fever.  HENT:  Negative for sore throat.   Eyes:  Negative for pain.  Respiratory:  Positive for shortness of breath. Negative for cough and stridor.   Cardiovascular:  Positive for chest pain and palpitations.  Gastrointestinal:  Positive for nausea. Negative for vomiting.  Genitourinary:  Negative for dysuria.  Musculoskeletal:  Negative for  myalgias.  Skin:  Negative for rash.  Neurological:  Negative for seizures, loss of consciousness and headaches.  Psychiatric/Behavioral:  Negative for suicidal ideas.   All other systems reviewed and are negative.    ____________________________________________   PHYSICAL EXAM:  VITAL SIGNS: ED Triage Vitals  Enc Vitals Group     BP 07/25/21 1609 (!) 120/92     Pulse Rate 07/25/21 1609 (!) 140     Resp 07/25/21 1609 20     Temp 07/25/21 1609 98 F (36.7 C)     Temp Source 07/25/21 1609 Oral     SpO2 07/25/21 1609 100 %     Weight 07/25/21 1610 224 lb (101.6 kg)     Height 07/25/21 1610 5\' 5"  (1.651 m)     Head Circumference --      Peak Flow --      Pain Score 07/25/21 1609 5     Pain Loc --      Pain Edu? --      Excl. in [place]? --    Vitals:   07/25/21 1816 07/25/21 1830  BP: 106/81 110/81  Pulse: (!) 137 73  Resp:  20  Temp:    SpO2:  100%   Physical Exam Vitals and nursing note reviewed.  Constitutional:      General: She is not in acute distress.    Appearance: She is well-developed.  HENT:     Head: Normocephalic and atraumatic.     Right Ear: External ear normal.     Left Ear: External ear normal.     Nose: Nose  normal.  Eyes:     Conjunctiva/sclera: Conjunctivae normal.  Cardiovascular:     Rate and Rhythm: Regular rhythm. Tachycardia present.     Heart sounds: No murmur heard. Pulmonary:     Effort: Pulmonary effort is normal. No respiratory distress.     Breath sounds: Normal breath sounds.  Abdominal:     Palpations: Abdomen is soft.     Tenderness: There is no abdominal tenderness.  Musculoskeletal:        General: No swelling.     Cervical back: Neck supple.     Right lower leg: Edema present.     Left lower leg: Edema present.  Skin:    General: Skin is warm and dry.     Capillary Refill: Capillary refill takes less than 2 seconds.  Neurological:     Mental Status: She is alert and oriented to person, place, and time.  Psychiatric:        Mood and Affect: Mood normal.     ____________________________________________   LABS (all labs ordered are listed, but only abnormal results are displayed)  Labs Reviewed  BASIC METABOLIC PANEL - Abnormal; Notable for the following components:      Result Value   Potassium 2.9 (*)    Creatinine, Ser 1.03 (*)    Calcium 8.8 (*)    All other components within normal limits  RESP PANEL BY RT-PCR (FLU A&B, COVID) ARPGX2  CBC WITH DIFFERENTIAL/PLATELET  MAGNESIUM  TSH  BRAIN NATRIURETIC PEPTIDE  TROPONIN I (HIGH SENSITIVITY)  TROPONIN I (HIGH SENSITIVITY)   ____________________________________________  EKG  ECG shows what appears to be relatively slow SVT with a ventricular rate of 141 with a narrow QRS at 92 and a QTc interval of 533.  There is some nonspecific ST changes in inferior and lateral leads without other clearance of acute ischemia or significant arrhythmia. ____________________________________________  RADIOLOGY  ED  MD interpretation: Chest X-ray has evidence of edema without any focal consolidation, effusion, edema, pneumothorax or any other clear acute thoracic process.  Official radiology report(s): DG Chest Port  1 View  Result Date: 07/25/2021 CLINICAL DATA:  Palpitations EXAM: PORTABLE CHEST 1 VIEW COMPARISON:  Chest x-ray 03/17/2020, CT chest 05/19/2021 FINDINGS: The heart and mediastinal contours are unchanged. No focal consolidation. Chronic coarsened interstitial markings with no overt pulmonary edema. No pleural effusion. No pneumothorax. No acute osseous abnormality. IMPRESSION: 1. No active disease. 2.  Emphysema (ICD10-J43.9). Electronically Signed   By: Iven Finn M.D.   On: 07/25/2021 17:11    ____________________________________________   PROCEDURES  Procedure(s) performed (including Critical Care):  .1-3 Lead EKG Interpretation Performed by: Lucrezia Starch, MD Authorized by: Lucrezia Starch, MD     Interpretation: non-specific     ECG rate assessment: tachycardic     Rhythm: SVT     Ectopy: none     Conduction: normal     ____________________________________________   INITIAL IMPRESSION / ASSESSMENT AND PLAN / ED COURSE      Patient presents with above-stated history exam for assessment of above constellation of symptoms.  On arrival patient is tachycardic and borderline tachypneic with otherwise stable vital signs on room air.  Her lungs are clear bilaterally and she has some mild edema in the lower extremities.  Primary differential considerations include ACS, arrhythmia, metabolic derangements, anemia, CHF possibly an acute viral syndrome such as COVID or influenza.  ECG shows what appears to be relatively slow SVT with prolonged QTC.  Given troponin nonelevated x2 I have a low suspicion for ACS at this time.  Chest X-ray has evidence of edema without any focal consolidation, effusion, edema, pneumothorax or any other clear acute thoracic process.  CBC shows no leukocytosis or acute anemia.  Magnesium is within normal limits.  BMP remarkable for potassium of 2.9 without any other significant electrolyte or metabolic derangements.  TSH is within normal limits.   COVID influenza PCR is negative.  BNP is double digits and overall presentation is not consistent with acute CHF exacerbation.  Patient given some potassium and her home dose of metoprolol.  While the emergency room she seemed to spontaneously convert to more normal sinus rhythm with heart rate in the 70s.  Repeat EKG remarkable for sinus rhythm with a ventricular rate of 76 and some nonspecific change in V2 without other evidence of acute ischemia or significant arrhythmia.  QTc interval is 443.Marland Kitchen  Patient states he is feeling much better.  Unclear if the low potassium was contributing to tachycardia and symptoms earlier although given otherwise reassuring exam work-up patient stating she is feeling better I think she is stable for discharge with close outpatient PCP and cardiology follow-up.  Advised her to take her potassium which is already been prescribed as directed.  Also advised to have her potassium rechecked in 2 or 3 days.  Discharged in stable condition.  Strict return precautions advised and discussed.     ____________________________________________   FINAL CLINICAL IMPRESSION(S) / ED DIAGNOSES  Final diagnoses:  Palpitations  Chest pain, unspecified type  Hypokalemia    Medications  metoprolol succinate (TOPROL-XL) 24 hr tablet 100 mg (100 mg Oral Given 07/25/21 1816)  potassium chloride SA (KLOR-CON M) CR tablet 40 mEq (40 mEq Oral Given 07/25/21 1816)     ED Discharge Orders     None        Note:  This document was prepared using Dragon voice  recognition software and may include unintentional dictation errors.    Lucrezia Starch, MD 07/25/21 [phone]

## 2021-07-25 NOTE — Telephone Encounter (Signed)
While patient was here for a lab draw she informed the [facility] that she was not feeling well, and felt a tightness in her chest when she started walking into the building today. Patients BP was 121/95, and HR was 144-147. Patient has a history of Afib but had a successful ablation a year ago. We had no OV available and I recommended that the patient go to the ED as she was having active chest pain and stated that, "She did not feel well".  Patient was agreeable and [place] escorted her by wheelchair to the ED.

## 2021-07-26 ENCOUNTER — Telehealth: Payer: Self-pay

## 2021-07-26 ENCOUNTER — Other Ambulatory Visit: Payer: Self-pay | Admitting: *Deleted

## 2021-07-26 LAB — BASIC METABOLIC PANEL
BUN/Creatinine Ratio: 8 — ABNORMAL LOW (ref 9–23)
BUN: 9 mg/dL (ref 6–24)
CO2: 27 mmol/L (ref 20–29)
Calcium: 8.5 mg/dL — ABNORMAL LOW (ref 8.7–10.2)
Chloride: 97 mmol/L (ref 96–106)
Creatinine, Ser: 1.08 mg/dL — ABNORMAL HIGH (ref 0.57–1.00)
Glucose: 97 mg/dL (ref 70–99)
Potassium: 2.9 mmol/L — ABNORMAL LOW (ref 3.5–5.2)
Sodium: 140 mmol/L (ref 134–144)
eGFR: 61 mL/min/{1.73_m2} (ref >59)

## 2021-07-26 MED ORDER — POTASSIUM CHLORIDE ER 10 MEQ PO TBCR: 10.0000 meq | EXTENDED_RELEASE_TABLET | Freq: Every day | ORAL | 3 refills | Status: DC

## 2021-07-26 NOTE — Telephone Encounter (Signed)
Patient called in requesting letter for work. She wants the letter to start from yesterday due to coming in for labs and to be out Thursday & Friday. Advised that provider is out of the office but I would send this to him for review and it may be next week before we can get that letter completed. She verbalized understanding with no further questions at this time.

## 2021-07-26 NOTE — Telephone Encounter (Signed)
Patient calling to make [place] aware of what the hospital said yesterday -- we sent her there for her HR being 146-147.   Patient states she is still not feeling well and would like to know if she can get a note for being out tomorrow and for the rest of the week.   She stated that she thinks her BP medication needs to be a little stronger.   She does have an appt tomorrow for an ECHO so she stated she can pick the letter up at that time or it can be placed on mychart.

## 2021-07-27 ENCOUNTER — Telehealth: Payer: Self-pay | Admitting: Cardiology

## 2021-07-27 ENCOUNTER — Telehealth: Payer: Self-pay | Admitting: *Deleted

## 2021-07-27 ENCOUNTER — Ambulatory Visit (INDEPENDENT_AMBULATORY_CARE_PROVIDER_SITE_OTHER): Payer: BC Managed Care – PPO

## 2021-07-27 ENCOUNTER — Encounter: Payer: Self-pay | Admitting: *Deleted

## 2021-07-27 ENCOUNTER — Other Ambulatory Visit: Payer: Self-pay

## 2021-07-27 DIAGNOSIS — I480 Paroxysmal atrial fibrillation: Secondary | ICD-10-CM

## 2021-07-27 DIAGNOSIS — I5033 Acute on chronic diastolic (congestive) heart failure: Secondary | ICD-10-CM

## 2021-07-27 DIAGNOSIS — Z0279 Encounter for issue of other medical certificate: Secondary | ICD-10-CM

## 2021-07-27 LAB — ECHOCARDIOGRAM COMPLETE
AR max vel: 2.99 cm2
AV Area VTI: 2.96 cm2
AV Area mean vel: 3.17 cm2
AV Mean grad: 3 mmHg
AV Peak grad: 6.9 mmHg
Ao pk vel: 1.31 m/s
Area-P 1/2: 3.12 cm2
Calc EF: 60 %
S' Lateral: 2.9 cm
Single Plane A2C EF: 59.7 %
Single Plane A4C EF: 59.3 %

## 2021-07-27 MED ORDER — POTASSIUM CHLORIDE ER 10 MEQ PO TBCR: 40.0000 meq | EXTENDED_RELEASE_TABLET | Freq: Every day | ORAL | 1 refills | Status: DC

## 2021-07-27 NOTE — Addendum Note (Signed)
Addended by: Valora Corporal on: 07/27/2021 08:46 AM   Modules accepted: Orders

## 2021-07-27 NOTE — Telephone Encounter (Signed)
Patient came by office  Dropped off FMLA forms to be completed  Filled out our forms and paid $29 fee cash Placed in nurse box

## 2021-07-27 NOTE — Telephone Encounter (Signed)
-----   Message from Theora Gianotti, NP sent at 07/26/2021  5:08 PM EST ----- Potassium is very low at 2.9.  Kidney function is relatively stable.  It appears that she has been taking potassium chloride 10 mEq daily.  She should take 40 mEq now and 40 mEq again in 2 hours.  I recommend increasing daily dose to 40 mEq once a day with plan for follow-up basic metabolic panel [facility].

## 2021-07-27 NOTE — Telephone Encounter (Signed)
Returned the call to the pt.  She was inquiring about her surgical clearance for an injection with [facility].  Pt was advised that we sent the clearance back to [facility] 07/19/21.  Pt was thankful for the call back.

## 2021-07-27 NOTE — Telephone Encounter (Signed)
Patient calling states still waiting for reply on clearance, please assist

## 2021-07-27 NOTE — Telephone Encounter (Signed)
Spoke with patient and reviewed results and recommendations. Instructed her to take 3 more tablets of potassium when we get off the phone and then in 2 hours to take 4 more tablets. After today advised she should take 4 tablets daily. Inquired if she would like me to send in the 20 meq pill but she declined, requesting to continue with the lower dose pill due to size. Reviewed that we need her to go over to the [facility] on Friday and check in at registration to have repeat labs done. Will send in updated prescription and advised we would call her with results once available. She verbalized understanding of our conversation, repeated instructions back, and acknowledged need for those repeat labs with no further questions at this time.

## 2021-07-27 NOTE — Telephone Encounter (Signed)
Forms received and placed on provider desk for review and completion.

## 2021-07-28 ENCOUNTER — Other Ambulatory Visit
Admission: RE | Admit: 2021-07-28 | Discharge: 2021-07-28 | Disposition: A | Discharge status: Home / Self Care | Payer: BC Managed Care – PPO | Attending: Nurse Practitioner | Admitting: Nurse Practitioner

## 2021-07-28 ENCOUNTER — Telehealth: Payer: Self-pay | Admitting: Nurse Practitioner

## 2021-07-28 DIAGNOSIS — I480 Paroxysmal atrial fibrillation: Secondary | ICD-10-CM | POA: Diagnosis present

## 2021-07-28 DIAGNOSIS — I5033 Acute on chronic diastolic (congestive) heart failure: Secondary | ICD-10-CM | POA: Diagnosis present

## 2021-07-28 LAB — BASIC METABOLIC PANEL
Anion gap: 5 (ref 5–15)
BUN: 9 mg/dL (ref 6–20)
CO2: 27 mmol/L (ref 22–32)
Calcium: 8.7 mg/dL — ABNORMAL LOW (ref 8.9–10.3)
Chloride: 102 mmol/L (ref 98–111)
Creatinine, Ser: 1.04 mg/dL — ABNORMAL HIGH (ref 0.44–1.00)
GFR, Estimated: >60 mL/min (ref >60)
Glucose, Bld: 92 mg/dL (ref 70–99)
Potassium: 3.5 mmol/L (ref 3.5–5.1)
Sodium: 134 mmol/L — ABNORMAL LOW (ref 135–145)

## 2021-07-28 NOTE — Telephone Encounter (Signed)
The patient called back to the office. I have reviewed her echo results with her. She voices understanding of results and recommendations and is agreeable.

## 2021-07-28 NOTE — Telephone Encounter (Signed)
Victoria Gianotti, NP  07/28/2021  3:48 PM EST     Normal heart squeezing function.  Pressures on the right side of the heart are moderately elevated.  The aorta is mildly dilated.  No significant valvular disease.  Cont torsemide and f/u as planned.

## 2021-07-28 NOTE — Telephone Encounter (Signed)
Attempted to call the patient. No answer- I left a message to please call back.  

## 2021-08-01 ENCOUNTER — Other Ambulatory Visit: Payer: Self-pay

## 2021-08-01 DIAGNOSIS — Z79899 Other long term (current) drug therapy: Secondary | ICD-10-CM

## 2021-08-01 DIAGNOSIS — E875 Hyperkalemia: Secondary | ICD-10-CM

## 2021-08-01 NOTE — Telephone Encounter (Signed)
Note for work has been dictated, printed, signed, and given to clinical staff.

## 2021-08-01 NOTE — Telephone Encounter (Signed)
Forms have been completed and given to clinical staff.

## 2021-08-02 NOTE — Telephone Encounter (Signed)
Called patient and informed her that her FMLA paperwork is ready for her to pick up. Place a copy at the front desk and a copy in the scan box.

## 2021-08-04 ENCOUNTER — Other Ambulatory Visit: Payer: Self-pay | Admitting: *Deleted

## 2021-08-04 ENCOUNTER — Other Ambulatory Visit
Admission: RE | Admit: 2021-08-04 | Discharge: 2021-08-04 | Disposition: A | Discharge status: Home / Self Care | Payer: BC Managed Care – PPO | Attending: Nurse Practitioner | Admitting: Nurse Practitioner

## 2021-08-04 DIAGNOSIS — E875 Hyperkalemia: Secondary | ICD-10-CM | POA: Diagnosis not present

## 2021-08-04 DIAGNOSIS — Z79899 Other long term (current) drug therapy: Secondary | ICD-10-CM | POA: Diagnosis present

## 2021-08-04 LAB — BASIC METABOLIC PANEL
Anion gap: 6 (ref 5–15)
BUN: 13 mg/dL (ref 6–20)
CO2: 26 mmol/L (ref 22–32)
Calcium: 9.1 mg/dL (ref 8.9–10.3)
Chloride: 103 mmol/L (ref 98–111)
Creatinine, Ser: 0.83 mg/dL (ref 0.44–1.00)
GFR, Estimated: >60 mL/min (ref >60)
Glucose, Bld: 101 mg/dL — ABNORMAL HIGH (ref 70–99)
Potassium: 3.9 mmol/L (ref 3.5–5.1)
Sodium: 135 mmol/L (ref 135–145)

## 2021-08-04 MED ORDER — POTASSIUM CHLORIDE ER 10 MEQ PO TBCR: 20.0000 meq | EXTENDED_RELEASE_TABLET | Freq: Every day | ORAL | 1 refills | Status: DC

## 2021-08-21 NOTE — Progress Notes (Signed)
Cardiology Office Note    Date:  08/22/2021   ID:  Shama Vonita Moss, DOB August 29, 1965, MRN [id number]  PCP:  Michela Pitcher, NP  Cardiologist:  Kate Sable, MD  Electrophysiologist:  Vickie Epley, MD   Chief Complaint: Follow-up  History of Present Illness:   Victoria Williamson is a 56 y.o. female with history of persistent A. fib status post PVI in 07/2020, SVT, HFpEF, HTN, ectatic ascending aorta, COVID in 08/2020, and obesity who presents for follow-up of HFpEF, SVT, and A. fib.   Prior documentation indicates she underwent LHC in 2014 and was told she had normal coronary arteries.  She was initially diagnosed with A. fib in 09/2019 and had been maintained on Eliquis and flecainide, though the latter led to xerostomia and difficulty sleeping leading to its discontinuation.  Echo in 12/2019 demonstrated an EF of 55% with grade 2 diastolic dysfunction.  PET MPI in 01/2020 showed no evidence of ischemia.  She was subsequently evaluated by EP and underwent A. fib PVI on 07/15/2020.  With palpitations, she underwent outpatient cardiac monitoring which showed an average heart rate of 79 bpm with a range of 54 to 190 bpm, 7 episodes of SVT with the longest episode lasting 1 hour and 33 minutes with an average rate of 128 bpm and no evidence of A. fib.  Given this, she was initiated on Toprol-XL.   She was seen in the office in 03/2021 with her weight up 14 pounds when compared to her visit in our office in 11/2020.  She reported lower extremity swelling.  She was maintaining sinus rhythm.  She was advised to take Lasix 20 mg daily as needed for edema.   She contacted our office on 07/17/2021 noting a 2-week history of increasing shortness of breath and swelling.  She had been taking Lasix 10 mg daily.  She was last seen in the office on 07/18/2021 noting increased lower extremity swelling that had been present since 04/2021, and appeared to be getting worse.  She did note having been on  several courses of prednisone for back pain during the above time span.  She also noted some bradycardia/hypotension associated with muscle relaxer.  She was started on torsemide 20 mg daily in place of furosemide.  Echo was recommended, though she presented to the ED on 12/20, prior to completion of echo, with chest pressure, shortness of breath, nausea, and palpitations.  She was noted to be in SVT and hypokalemic with a potassium of 2.9.  Magnesium 1.9.  High-sensitivity troponin normal x2, BNP 25, COVID and influenza negative, TSH normal, chest x-ray without active disease with noted emphysema potassium was repleted and she was given metoprolol with noted conversion to sinus rhythm and improvement in symptoms.  Echo was subsequently completed on 07/27/2021 and demonstrated an EF of 60 to 65%, no regional wall motion abnormalities, mild LVH, normal LV diastolic function parameters, normal RV systolic function and ventricular cavity size, mildly elevated PASP with an estimated pressure of 36.8 mmHg, no significant valvular abnormalities, and borderline dilatation of the ascending aorta measuring 38 mm.  She comes in doing reasonably well from a cardiac perspective.  She did recently undergo epidural steroid injection on 08/18/2021.  With this, she does continue to note back discomfort.  She did hold Eliquis prior to this procedure without issues.  She is now back on [place].  No bleeding concerns with her injection.  No falls, hematochezia, or melena.  She has not had any  further chest discomfort, dyspnea, or palpitations.  She remains on torsemide 20 mg daily along with KCl 20 mill equivalent daily.  She notes good urine output with torsemide.   Labs independently reviewed: 07/2021 - potassium 3.9, BUN 13, serum creatinine 0.83, TSH normal, magnesium 1.9, Hgb 14.7, PLT 263 03/2021 - TC 164, TG 92, HDL 49, LDL 96, A1c 5.6, albumin 4.2, AST normal, ALT 31 11/2020 - TSH normal  Past Medical History:  Diagnosis  Date   Anxiety    Arthritis    Chest pain    normal coronary arteries by cardiac cath oct 29,2014   Colon polyp    Depression    Dysautonomia ([place]) 12/18/2013   Fatty liver    Heart disease    History of blood transfusion    Hypertension    PAF (paroxysmal atrial fibrillation) ([place])    Skin cancer, basal cell    Thyroid nodule     Past Surgical History:  Procedure Laterality Date   ATRIAL FIBRILLATION ABLATION N/A 07/15/2020   Procedure: ATRIAL FIBRILLATION ABLATION;  Surgeon: Vickie Epley, MD;  Location: [facility];  Service: Cardiovascular;  Laterality: N/A;   btl     CARDIOVERSION N/A 05/11/2020   Procedure: CARDIOVERSION;  Surgeon: Kate Sable, MD;  Location: [facility];  Service: Cardiovascular;  Laterality: N/A;   CARPAL TUNNEL RELEASE Left 06/23/2020   Procedure: CARPAL TUNNEL RELEASE;  Surgeon: [name], MD;  Location: [facility];  Service: Orthopedics;  Laterality: Left;  IV REGIONAL FOREARM BLOCK   ENDOMETRIAL ABLATION     right carpal tunel      Current Medications: Current Meds  Medication Sig   apixaban (ELIQUIS) 5 MG TABS tablet Take 1 tablet (5 mg total) by mouth 2 (two) times daily.   ARIPiprazole (ABILIFY) 10 MG tablet Take 0.5 tablets (5 mg total) by mouth daily.   Cholecalciferol (CVS VIT D 5000 HIGH-POTENCY PO) Take by mouth daily in the afternoon.   metoprolol succinate (TOPROL-XL) 100 MG 24 hr tablet Take 1 tablet (100 mg total) by mouth daily. Take with or immediately following a meal.   metoprolol tartrate (LOPRESSOR) 25 MG tablet Take 4 tablets (100 mg total) by mouth 2 (two) times daily as needed. Take as needed for palpitations.   mirtazapine (REMERON) 15 MG tablet Take 1 tablet (15 mg total) by mouth at bedtime.   pantoprazole (PROTONIX) 40 MG tablet Take 40 mg by mouth daily.   potassium chloride (KLOR-CON) 10 MEQ tablet Take 2 tablets (20 mEq total) by mouth daily.   tiZANidine (ZANAFLEX) 4 MG tablet Take 4 mg  by mouth every 6 (six) hours.   torsemide (DEMADEX) 20 MG tablet Take 1 tablet (20 mg total) by mouth daily.    Allergies:   Codeine, Meloxicam, and Paroxetine hcl   Social History   Socioeconomic History   Marital status: Married    Spouse name: Not on file   Number of children: Not on file   Years of education: Not on file   Highest education level: Not on file  Occupational History   Not on file  Tobacco Use   Smoking status: Former    Packs/day: 1.00    Years: 36.00    Pack years: 36.00    Types: Cigarettes    Quit date: 12/29/2019    Years since quitting: 1.6   Smokeless tobacco: Never  Vaping Use   Vaping Use: Every day  Substance and Sexual Activity   Alcohol  use: Not Currently   Drug use: Never   Sexual activity: Yes    Partners: Male  Other Topics Concern   Not on file  Social History Narrative   Lives at home with husband   Social Determinants of Health   Financial Resource Strain: Not on file  Food Insecurity: Not on file  Transportation Needs: Not on file  Physical Activity: Not on file  Stress: Not on file  Social Connections: Not on file     Family History:  The patient's family history includes Alcohol abuse in her father; Arthritis in her mother; Breast cancer (age of onset: 54) in her maternal aunt; Breast cancer (age of onset: 94) in her maternal grandmother; COPD in her mother; Depression in her paternal grandmother; Diabetes in her mother and another family member; Hearing loss in her father; Heart attack in her brother, father, and paternal grandfather; Heart disease in her father, mother, and paternal grandfather; High Cholesterol in her father and mother; Hypertension in her father and mother; Kidney disease in her mother.  ROS:   Review of Systems  Constitutional:  Negative for chills, diaphoresis, fever, malaise/fatigue and weight loss.  HENT:  Negative for congestion.   Eyes:  Negative for discharge and redness.  Respiratory:  Negative  for cough, sputum production, shortness of breath and wheezing.   Cardiovascular:  Positive for leg swelling. Negative for chest pain, palpitations, orthopnea, claudication and PND.  Gastrointestinal:  Negative for abdominal pain, blood in stool, heartburn, melena, nausea and vomiting.  Musculoskeletal:  Positive for back pain. Negative for falls and myalgias.  Skin:  Negative for rash.  Neurological:  Negative for dizziness, tingling, tremors, sensory change, speech change, focal weakness, loss of consciousness and weakness.  Endo/Heme/Allergies:  Does not bruise/bleed easily.  Psychiatric/Behavioral:  Negative for substance abuse. The patient is not nervous/anxious.   All other systems reviewed and are negative.   EKGs/Labs/Other Studies Reviewed:    Studies reviewed were summarized above. The additional studies were reviewed today:  2D echo 07/27/2021: 1. Left ventricular ejection fraction, by estimation, is 60 to 65%. The  left ventricle has normal function. The left ventricle has no regional  wall motion abnormalities. There is mild left ventricular hypertrophy.  Left ventricular diastolic parameters  were normal.   2. Right ventricular systolic function is normal. The right ventricular  size is normal. There is mildly elevated pulmonary artery systolic  pressure. The estimated right ventricular systolic pressure is 93.8 mmHg.   3. The mitral valve is normal in structure. No evidence of mitral valve  regurgitation. No evidence of mitral stenosis.   4. The aortic valve was not well visualized. Aortic valve regurgitation  is not visualized. No aortic stenosis is present.   5. There is borderline dilatation of the ascending aorta, measuring 38  mm.   6. The inferior vena cava is normal in size with greater than 50%  respiratory variability, suggesting right atrial pressure of 3 mmHg. __________  Elwyn Reach patch 10/2020: HR 54-190bpm, average 79bpm. 7 episodes of SVT, longest lasting  1h31min with an average rate of 128bpm. Review of rhythm strip shows P wave preceding each QRS suggestive of AT. No evidence of atrial fibrillation. __________   2D echo 05/2020: 1. Left ventricular ejection fraction, by estimation, is 60 to 65%. The  left ventricle has normal function. The left ventricle has no regional  wall motion abnormalities. Left ventricular diastolic parameters are  indeterminate.   2. Right ventricular systolic function is normal. The  right ventricular  size is normal. There is normal pulmonary artery systolic pressure.   3. The mitral valve is normal in structure. Trivial mitral valve  regurgitation. No evidence of mitral stenosis.   4. The aortic valve has an indeterminant number of cusps. Aortic valve  regurgitation is not visualized. No aortic stenosis is present.   5. The inferior vena cava is normal in size with greater than 50%  respiratory variability, suggesting right atrial pressure of 3 mmHg.   EKG:  EKG is not ordered today.    Recent Labs: 03/17/2021: ALT 31 07/25/2021: B Natriuretic Peptide 25.8; Hemoglobin 14.7; Magnesium 1.9; Platelets 263; TSH 3.762 08/04/2021: BUN 13; Creatinine, Ser 0.83; Potassium 3.9; Sodium 135  Recent Lipid Panel    Component Value Date/Time   CHOL 164 03/17/2021 1543   TRIG 92 03/17/2021 1543   HDL 49 (L) 03/17/2021 1543   CHOLHDL 3.3 03/17/2021 1543   LDLCALC 96 03/17/2021 1543    PHYSICAL EXAM:    VS:  BP 126/80 (BP Location: Left Arm, Patient Position: Sitting, Cuff Size: Large)    Pulse 86    Ht 5\' 5"  (1.651 m)    Wt 230 lb (104.3 kg)    SpO2 97%    BMI 38.27 kg/m   BMI: Body mass index is 38.27 kg/m.  Physical Exam Vitals reviewed.  Constitutional:      Appearance: She is well-developed.  HENT:     Head: Normocephalic and atraumatic.  Eyes:     General:        Right eye: No discharge.        Left eye: No discharge.  Neck:     Vascular: No JVD.  Cardiovascular:     Rate and Rhythm: Normal rate  and regular rhythm.     Pulses:          Posterior tibial pulses are 2+ on the right side and 2+ on the left side.     Heart sounds: Normal heart sounds, S1 normal and S2 normal. Heart sounds not distant. No midsystolic click and no opening snap. No murmur heard.   No friction rub.  Pulmonary:     Effort: Pulmonary effort is normal. No respiratory distress.     Breath sounds: Normal breath sounds. No decreased breath sounds, wheezing or rales.  Chest:     Chest wall: No tenderness.  Abdominal:     General: There is no distension.     Palpations: Abdomen is soft.     Tenderness: There is no abdominal tenderness.  Musculoskeletal:     Cervical back: Normal range of motion.     Right lower leg: Edema present.     Left lower leg: Edema present.     Comments: Trace bilateral pretibial edema  Skin:    General: Skin is warm and dry.     Nails: There is no clubbing.  Neurological:     Mental Status: She is alert and oriented to person, place, and time.  Psychiatric:        Speech: Speech normal.        Behavior: Behavior normal.        Thought Content: Thought content normal.        Judgment: Judgment normal.    Wt Readings from Last 3 Encounters:  08/22/21 230 lb (104.3 kg)  07/25/21 224 lb (101.6 kg)  07/18/21 224 lb (101.6 kg)     ASSESSMENT & PLAN:   HFpEF: She appears largely euvolemic and well  compensated.  Recent echo demonstrated preserved LV systolic function and normal diastolic function.  She remains on torsemide 20 mg daily along with KCl 20 mill equivalent daily.  Continue to recommend leg elevation and compression stockings.  Minimize systemic steroid use for back pain as much as possible in an effort to minimize fluid retention.  Check BMP.  PAF: Status post PVI in 07/2020.  Maintaining sinus rhythm.  She remains on Toprol-XL.  Given a CHA2DS2-VASc of at least 2 she remains on apixaban without symptoms concerning for bleeding.  Follow-up with EP.  Paroxysmal SVT: No  further episodes of SVT since her ED visit.  This episode was likely precipitated by significant hypokalemia, which has subsequently been repleted.  Continue Toprol-XL 100 mg.  Add as needed Lopressor 25 mg twice daily for sustained tachypalpitations.  Vagal maneuvers discussed.  Given infrequent symptoms at this time, we will defer EP evaluation for ablation.  This can always be revisited down the road if indicated.  Ectatic ascending aorta: CT imaging in 07/2020 demonstrated an ectatic aorta measuring 4 cm in diameter.  Recent echo demonstrated stable and borderline dilatation of the aorta measuring 38 mm.  Continue optimal blood pressure control and periodic imaging.  HTN: Blood pressure is well controlled in the office today.  She remains on Toprol-XL 100 mg daily along with torsemide 20 mg daily.  Hypokalemia: Repleted on last check.  Check BMP.    Disposition: F/u with Dr. Garen Lah or an APP in 3 months, and EP as directed.   Medication Adjustments/Labs and Tests Ordered: Current medicines are reviewed at length with the patient today.  Concerns regarding medicines are outlined above. Medication changes, Labs and Tests ordered today are summarized above and listed in the Patient Instructions accessible in Encounters.   Signed, [name], PA-C 08/22/2021 12:41 PM     [facility] [address] [place], [place] [address] [phone]

## 2021-08-22 ENCOUNTER — Other Ambulatory Visit: Payer: Self-pay

## 2021-08-22 ENCOUNTER — Encounter: Payer: Self-pay | Admitting: Physician Assistant

## 2021-08-22 ENCOUNTER — Ambulatory Visit (INDEPENDENT_AMBULATORY_CARE_PROVIDER_SITE_OTHER): Payer: BC Managed Care – PPO | Admitting: Physician Assistant

## 2021-08-22 VITALS — BP 126/80 | HR 86 | Ht 65.0 in | Wt 230.0 lb

## 2021-08-22 DIAGNOSIS — E876 Hypokalemia: Secondary | ICD-10-CM

## 2021-08-22 DIAGNOSIS — I471 Supraventricular tachycardia, unspecified: Secondary | ICD-10-CM

## 2021-08-22 DIAGNOSIS — I480 Paroxysmal atrial fibrillation: Secondary | ICD-10-CM | POA: Diagnosis not present

## 2021-08-22 DIAGNOSIS — I5032 Chronic diastolic (congestive) heart failure: Secondary | ICD-10-CM | POA: Diagnosis not present

## 2021-08-22 DIAGNOSIS — I77810 Thoracic aortic ectasia: Secondary | ICD-10-CM

## 2021-08-22 DIAGNOSIS — I10 Essential (primary) hypertension: Secondary | ICD-10-CM

## 2021-08-22 MED ORDER — METOPROLOL TARTRATE 25 MG PO TABS: 100.0000 mg | ORAL_TABLET | Freq: Two times a day (BID) | ORAL | 3 refills | Status: DC | PRN

## 2021-08-22 NOTE — Patient Instructions (Signed)
Medication Instructions:  Your physician has recommended you make the following change in your medication:   START taking metoprolol tartrate (Lopressor) 25 mg twice daily as needed for palpitations   *If you need a refill on your cardiac medications before your next appointment, please call your pharmacy*   Lab Work:  Your provider has ordered lab work (BMET) to be drawn today prior to leaving the office.   If you have labs (blood work) drawn today and your tests are completely normal, you will receive your results only by: [place] (if you have MyChart) OR A paper copy in the mail If you have any lab test that is abnormal or we need to change your treatment, we will call you to review the results.   Testing/Procedures: None ordered   Follow-Up: At [facility], you and your health needs are our priority.  As part of our continuing mission to provide you with exceptional heart care, we have created designated Provider Care Teams.  These Care Teams include your primary Cardiologist (physician) and Advanced Practice Providers (APPs -  Physician Assistants and Nurse Practitioners) who all work together to provide you with the care you need, when you need it.  We recommend signing up for the patient portal called "MyChart".  Sign up information is provided on this After Visit Summary.  MyChart is used to connect with patients for Virtual Visits (Telemedicine).  Patients are able to view lab/test results, encounter notes, upcoming appointments, etc.  Non-urgent messages can be sent to your provider as well.   To learn more about what you can do with MyChart, go to [email or web address].    Your next appointment:   3 month(s)  The format for your next appointment:   In Person  Provider:   You may see Kate Sable, MD or one of the following Advanced Practice Providers on your designated Care Team:   Murray Hodgkins, NP [name], PA-C Cadence Kathlen Mody, PA-C1}     Other Instructions N/A

## 2021-08-23 LAB — BASIC METABOLIC PANEL
BUN/Creatinine Ratio: 14 (ref 9–23)
BUN: 12 mg/dL (ref 6–24)
CO2: 25 mmol/L (ref 20–29)
Calcium: 9 mg/dL (ref 8.7–10.2)
Chloride: 101 mmol/L (ref 96–106)
Creatinine, Ser: 0.85 mg/dL (ref 0.57–1.00)
Glucose: 84 mg/dL (ref 70–99)
Potassium: 3.5 mmol/L (ref 3.5–5.2)
Sodium: 143 mmol/L (ref 134–144)
eGFR: 81 mL/min/{1.73_m2} (ref >59)

## 2021-08-24 ENCOUNTER — Telehealth: Payer: Self-pay | Admitting: *Deleted

## 2021-08-24 DIAGNOSIS — I5032 Chronic diastolic (congestive) heart failure: Secondary | ICD-10-CM

## 2021-08-24 DIAGNOSIS — E876 Hypokalemia: Secondary | ICD-10-CM

## 2021-08-24 MED ORDER — POTASSIUM CHLORIDE ER 10 MEQ PO TBCR: 40.0000 meq | EXTENDED_RELEASE_TABLET | Freq: Every day | ORAL | 3 refills | Status: DC

## 2021-08-24 NOTE — Telephone Encounter (Signed)
Spoke with patient and reviewed results and recommendations. She verbalized understanding and was agreeable with plan. Scheduled her to come in next Friday 09/01/21 at 3:30 pm to have those labs done. No further questions at this time.

## 2021-08-24 NOTE — Telephone Encounter (Signed)
-----   Message from Rise Mu, PA-C sent at 08/24/2021  7:33 AM EST ----- Random glucose normal Renal function normal Potassium low normal, though below goal  Recommendations: Please have her increase KCl to 40 mEq daily Recheck BMET in 1 week When she is seen in follow-up, consider addition of spironolactone

## 2021-08-29 ENCOUNTER — Telehealth: Payer: Self-pay

## 2021-08-29 ENCOUNTER — Other Ambulatory Visit: Payer: Self-pay | Admitting: Physician Assistant

## 2021-08-29 DIAGNOSIS — I471 Supraventricular tachycardia: Secondary | ICD-10-CM

## 2021-08-29 NOTE — Telephone Encounter (Signed)
Pt called stating she received the message. Pt states that she didn't take any B/P medication yesterday.

## 2021-08-29 NOTE — Telephone Encounter (Signed)
[facility] Night - Client TELEPHONE ADVICE RECORD [facility] Patient Name: Victoria Williamson Gender: Female DOB: 08/29/1965 Age: 56 Y 78 M 13 D Return Phone Number: [phone] (Primary) Address: City/ State/ Zip: [place]  [address] Client  [facility] - Client Client Site [place] Provider Romilda Garret- NP Contact Type Call Who Is Calling Patient / Member / Family / Caregiver Call Type Triage / Clinical Relationship To Patient Self Return Phone Number [phone] (Primary) Chief Complaint Blood Pressure Low Reason for Call Symptomatic / Request for [place] states called BC/BS line about a muscle relaxant prescribed. On two medications, one being Tremadol/. BP was low and was told to call the clinic. BP 94/57 now coming up with HR 73 Translation No Nurse Assessment Nurse: Romona Curls, RN, Denyse Amass Date/Time (Eastern Time): 08/28/2021 9:32:25 PM Confirm and document reason for call. If symptomatic, describe symptoms. ---Caller states called BC/BS line about a muscle relaxant prescribed. On two medications, one being Tramadol. BP was low and was told to call the clinic. BP 94/57 now coming up with HR 73. Caller states 96/60, HR 73, just now. Caller states had back issues, took 2 Tramadol. Denies dizziness. Does the patient have any new or worsening symptoms? ---Yes Will a triage be completed? ---Yes Related visit to physician within the last 2 weeks? ---Yes Does the PT have any chronic conditions? (i.e. diabetes, asthma, this includes High risk factors for pregnancy, etc.) ---Yes List chronic conditions. ---A fib, cardiac ablation Is the patient pregnant or possibly pregnant? (Ask all females between the ages of 36-55) ---No Is this a behavioral health or substance abuse call? ---No Guidelines Guideline Title Affirmed Question Affirmed Notes Nurse  Date/Time (Eastern Time) Blood Pressure - Low [6] Systolic BP 57-846 AND [9] taking blood pressure medications AND Pollyann Kennedy 08/28/2021 9:35:05 PM PLEASE NOTE: All timestamps contained within this report are represented as [place] Standard Time. CONFIDENTIALTY NOTICE: This fax transmission is intended only for the addressee. It contains information that is legally privileged, confidential or otherwise protected from use or disclosure. If you are not the intended recipient, you are strictly prohibited from reviewing, disclosing, copying using or disseminating any of this information or taking any action in reliance on or regarding this information. If you have received this fax in error, please notify us immediately by telephone so that we can arrange for its return to [place]. Phone: [phone], Toll-Free: [phone], Fax: [phone] Page: 2 of 2 Call Id: [id number] Guidelines Guideline Title Affirmed Question Affirmed Notes Nurse Date/Time Eilene Ghazi Time) [3] NOT dizzy, lightheaded or weak Disp. Time Eilene Ghazi Time) Disposition Final User 08/28/2021 9:41:14 PM See PCP within 24 Hours Yes Romona Curls, RN, Denyse Amass Caller Disagree/Comply Comply Caller Understands Yes PreDisposition [place] Advice Given Per Guideline SEE PCP WITHIN 24 HOURS: * IF OFFICE WILL BE OPEN: You need to be examined within the next 24 hours. Call your doctor (or NP/PA) when the office opens and make an appointment. CALL BACK IF: * Lightheadedness, weakness, or dizziness occurs * Systolic BP under 90 * You feel sick * You become worse CARE ADVICE given per Low Blood Pressure (Adult) guideline. Comments User: Everardo All, RN Date/Time Eilene Ghazi Time): 08/28/2021 9:34:47 PM Caller states on Lasix User: Everardo All, RN Date/Time Eilene Ghazi Time): 08/28/2021 9:35:45 PM Caller states feeling better now. User: Everardo All, RN Date/Time Eilene Ghazi Time): 08/28/2021 9:37:35 PM Caller states usually takes  115/75 User: Denyse Amass,  Romona Curls, RN Date/Time Eilene Ghazi Time): 08/28/2021 9:39:29 PM Caller states 80/52 about 30-22mins ago. Referrals REFERRED TO PCP OFFIC

## 2021-08-29 NOTE — Telephone Encounter (Signed)
Left message for patient to call back and sent mychart message with the information

## 2021-08-29 NOTE — Telephone Encounter (Signed)
I spoke with pt; pt said she did not go to UC or ED due to BP kept getting better and going up. This morning BP was 119/75 P 75. Pt said no CP,SOB,H/A or dizziness. I offered pt an appt for today and pt said no she did not need appt.and was not going to take anymore of the tizanidine; pt said it is too strong for pt even taking 1/2 tab. UC & ED precautions given and pt voiced understanding. Sending note to Romilda Garret NP and Anastasiya CMA.

## 2021-08-29 NOTE — Telephone Encounter (Signed)
Can we reach out to the patient and make sure she takes her bp before she takes her bp meds for the next couple days.   The note states she feels like it was the tizanidine. She just needs to let the ordering prescriber know

## 2021-08-30 NOTE — Telephone Encounter (Signed)
Patient advised, patient was aware and has advised Dr Lamount Cranker of the issue with the medication. Her b/p has been stable.

## 2021-09-01 ENCOUNTER — Other Ambulatory Visit (INDEPENDENT_AMBULATORY_CARE_PROVIDER_SITE_OTHER): Payer: BC Managed Care – PPO

## 2021-09-01 ENCOUNTER — Other Ambulatory Visit: Payer: Self-pay

## 2021-09-01 DIAGNOSIS — E876 Hypokalemia: Secondary | ICD-10-CM

## 2021-09-01 DIAGNOSIS — I5032 Chronic diastolic (congestive) heart failure: Secondary | ICD-10-CM

## 2021-09-02 LAB — BASIC METABOLIC PANEL
BUN/Creatinine Ratio: 10 (ref 9–23)
BUN: 10 mg/dL (ref 6–24)
CO2: 25 mmol/L (ref 20–29)
Calcium: 9 mg/dL (ref 8.7–10.2)
Chloride: 103 mmol/L (ref 96–106)
Creatinine, Ser: 0.98 mg/dL (ref 0.57–1.00)
Glucose: 87 mg/dL (ref 70–99)
Potassium: 3.9 mmol/L (ref 3.5–5.2)
Sodium: 141 mmol/L (ref 134–144)
eGFR: 69 mL/min/{1.73_m2} (ref >59)

## 2021-10-18 ENCOUNTER — Telehealth: Payer: Self-pay | Admitting: Physician Assistant

## 2021-10-18 NOTE — Telephone Encounter (Signed)
Spoke with patient and she stated that her HR was 149  with a BP of 112/72, so she took Metoprolol Tartrate 25 MG (ordered as BID PRN). She stated that she has not had to use this before. 45 mins after taking the Metoprolol Tartrate, Her HR was 155, and BP was 113/72. Patient then stated that she stopped taking her Metoprolol Succinate 100 MG 3 days ago because her SBP was running in the 90's. ? ?I advised patient to take her Metoprolol Succinate 100 MG now, and if before bed her HR is still up and BP is okay, then she could take another Metoprolol Tartrate 25 MG. ? ?I informed patient that I would notify [name] and that someone would get back to her tomorrow sometime. Will route to [place] and his nurse. ? ?Patient verbalized understanding and agreed with plan.   ? ? ?  ?

## 2021-10-18 NOTE — Telephone Encounter (Signed)
STAT if HR is under 50 or over 120 (normal HR is 60-100 beats per minute)  What is your heart rate? 155  Do you have a log of your heart rate readings (document readings)? Earlier today was 149, took a Metoprolol. BP 113/72  Do you have any other symptoms? States she looks a little pale, and her heart is racing

## 2021-10-19 NOTE — Telephone Encounter (Signed)
If she is continuing to have tachycardic rates at this time, would recommend office visit with primary cardiologist, APP or EP (already established) today if there is an opening, otherwise she would need to go to the ED. If systolic BP remains > 460 mmHg, ok to continue Toprol daily with prn Lopressor. If systolic BP < 029 mmHg, hold daily Toprol. If these episodes are sustained, or become frequent, we will need to have her follow up with EP moved up. Let us know if this episode is persisting or if this was a one-time issue. ED precautions.  ?

## 2021-10-19 NOTE — Telephone Encounter (Signed)
Spoke with patient and reviewed his recommendations. She verbalized understanding of instructions. Advised I would send this same information via My Chart and if she should have any questions to please call back. Strict ED precautions reviewed as well. She verbalized understanding with no further questions at this time. ?

## 2021-10-19 NOTE — Telephone Encounter (Signed)
LMOV to schedule  

## 2021-10-20 ENCOUNTER — Ambulatory Visit (INDEPENDENT_AMBULATORY_CARE_PROVIDER_SITE_OTHER): Payer: BC Managed Care – PPO | Admitting: Nurse Practitioner

## 2021-10-20 ENCOUNTER — Encounter: Payer: Self-pay | Admitting: Nurse Practitioner

## 2021-10-20 ENCOUNTER — Other Ambulatory Visit: Payer: Self-pay

## 2021-10-20 VITALS — BP 108/66 | HR 78 | Temp 97.0°F | Resp 12 | Ht 64.75 in | Wt 226.2 lb

## 2021-10-20 DIAGNOSIS — Z87891 Personal history of nicotine dependence: Secondary | ICD-10-CM | POA: Diagnosis not present

## 2021-10-20 DIAGNOSIS — Z23 Encounter for immunization: Secondary | ICD-10-CM

## 2021-10-20 DIAGNOSIS — Z0000 Encounter for general adult medical examination without abnormal findings: Secondary | ICD-10-CM

## 2021-10-20 DIAGNOSIS — I10 Essential (primary) hypertension: Secondary | ICD-10-CM | POA: Diagnosis not present

## 2021-10-20 DIAGNOSIS — F32A Depression, unspecified: Secondary | ICD-10-CM

## 2021-10-20 DIAGNOSIS — F419 Anxiety disorder, unspecified: Secondary | ICD-10-CM | POA: Diagnosis not present

## 2021-10-20 DIAGNOSIS — Z1231 Encounter for screening mammogram for malignant neoplasm of breast: Secondary | ICD-10-CM

## 2021-10-20 DIAGNOSIS — E559 Vitamin D deficiency, unspecified: Secondary | ICD-10-CM

## 2021-10-20 LAB — POCT URINALYSIS DIPSTICK
Bilirubin, UA: NEGATIVE
Blood, UA: NEGATIVE
Glucose, UA: NEGATIVE
Ketones, UA: NEGATIVE
Leukocytes, UA: NEGATIVE
Nitrite, UA: NEGATIVE
Protein, UA: NEGATIVE
Spec Grav, UA: 1.025 (ref 1.010–1.025)
Urobilinogen, UA: NEGATIVE E.U./dL — AB
pH, UA: 5 (ref 5.0–8.0)

## 2021-10-20 MED ORDER — BUPROPION HCL ER (XL) 150 MG PO TB24: 150.0000 mg | ORAL_TABLET | Freq: Every day | ORAL | 0 refills | Status: DC

## 2021-10-20 NOTE — Patient Instructions (Signed)
Nice to see you today. You can start cutting the mirtazapine in half and take it for 2 weeks then start taking half every other day for a week then stop. ?I will start the Wellbutrin of bupropion. It can increase the effects of your metoprolol so keep an eye on the heart rate and blood pressure.  ?Follow up with me in  8 weeks, sooner if you need it ?I will be in touch with the lab results once I have them ?

## 2021-10-20 NOTE — Progress Notes (Signed)
? ?Established Patient Office Visit ? ?Subjective:  ?Patient ID: Victoria Williamson, female    DOB: Apr 12, 1966  Age: 56 y.o. MRN: [id number] ? ?CC:  ?Chief Complaint  ?Patient presents with  ? Annual Exam  ?  Pap smear was done 11/2020 with previous PCP  ? ? ?HPI ?Victoria Williamson presents for for complete physical and follow up of chronic conditions. ? ? ?Other providers: Dr. Alen Blew, [name].  Cardiology ?[facility]: Lamount Cranker ?Pain clinic intervention: Thalia Party ?Immunizations: ?-Tetanus: today ?-Influenza: refused ?-Covid-19: pfizer x2 ?-Shingles: infor ?-Pneumonia: NA ? ?-HPV: Doubt ? ?Diet: Fair diet. Once a day meal. Some snacking at her break at work. Soda mostly 20 ounce 2-3 a day. She will drink water at home ?Exercise: No regular exercise. Has back problems. Hsa a tredmill. States 10-30mn into walking her back or breathing gets 2-3 times a week ? ?Eye exam: Completes annually. Glasses.  Within a year ?Dental exam: Completes semi-annually Dr. kLynelle Smoke? ?Pap Smear: Completed in 11/2020 ?Mammogram: Completed in 10/14/2020 ?Colonoscopy: Completed in 2020 due in 2025 ?Dexa: Completed in  NA ?PSA: N/A ? ?Lung Cancer Screening: Completed in October 2022.  Scan scheduled for future 2023 ? ?Sleep: goes to bed around 8 and sleeps until 11pm and states that she goe sto the recliner and she will get some more sleep. It is because of her back. She does not tolerate muscle relaxers. She has also stopped tramadil. She is currenlty taking tylenol  ? ?PHQ9 SCORE ONLY 10/20/2021 03/17/2021  ?PHQ-9 Total Score 15 5  ?  ?Tried prozac, seroquel, zyprexa, zoflt, cymbalta all without great effect.  States that she feels like some of her shortness of breath and back pain limiting her activity is contributing to her state of depression.  Patient's denies SI/HI/AVH. ? ?GAD 7 : Generalized Anxiety Score 10/20/2021  ?Nervous, Anxious, on Edge 2  ?Control/stop worrying 2  ?Worry too much - different things  2  ?Trouble relaxing 3  ?Restless 0  ?Easily annoyed or irritable 3  ?Afraid - awful might happen 2  ?Total GAD 7 Score 14  ?Anxiety Difficulty Somewhat difficult  ? ?  ? ?Past Medical History:  ?Diagnosis Date  ? Anxiety   ? Arthritis   ? Chest pain   ? normal coronary arteries by cardiac cath oct 29,2014  ? Colon polyp   ? Depression   ? [place] [place]  ? Fatty liver   ? Heart disease   ? History of blood transfusion   ? Hypertension   ? PAF (paroxysmal atrial fibrillation) ([facility]   ? Skin cancer, basal cell   ? Thyroid nodule   ? ? ?Past Surgical History:  ?Procedure Laterality Date  ? ATRIAL FIBRILLATION ABLATION N/A 07/15/2020  ? Procedure: ATRIAL FIBRILLATION ABLATION;  Surgeon: LVickie Epley MD;  [facility];  Service: Cardiovascular;  Laterality: N/A;  ? btl    ? CARDIOVERSION N/A 05/11/2020  ? Procedure: CARDIOVERSION;  Surgeon: AKate Sable MD;  Location: [facility];  Service: Cardiovascular;  Laterality: N/A;  ? CARPAL TUNNEL RELEASE Left 06/23/2020  ? Procedure: CARPAL TUNNEL RELEASE;  Surgeon: KDaryll Brod MD;  [facility]  Service: Orthopedics;  Laterality: Left;  IV REGIONAL FOREARM BLOCK  ? ENDOMETRIAL ABLATION    ? right carpal tunel    ? ? ?Family History  ?Problem Relation Age of Onset  ? Heart disease Mother   ? Diabetes Mother   ? COPD  Mother   ? Hypertension Mother   ? Arthritis Mother   ? High Cholesterol Mother   ? Kidney disease Mother   ? Cancer Father 27  ?     kidney cancer with removal  ? Heart attack Father   ? Alcohol abuse Father   ? Hearing loss Father   ? Heart disease Father   ? High Cholesterol Father   ? Hypertension Father   ? Heart attack Brother 53  ?     heart attack that caused his death  ? Breast cancer Maternal Aunt 70  ? Breast cancer Maternal Grandmother 43  ? Depression Paternal Grandmother   ? Heart attack Paternal Grandfather   ? Heart disease Paternal Grandfather   ? Diabetes Other   ?     family HX,  unspecified  ? ? ?Social History  ? ?Socioeconomic History  ? Marital status: Married  ?  Spouse name: Not on file  ? Number of children: 2  ? Years of education: Not on file  ? Highest education level: Not on file  ?Occupational History  ? Not on file  ?Tobacco Use  ? Smoking status: Former  ?  Packs/day: 1.00  ?  Years: 37.00  ?  Pack years: 37.00  ?  Types: Cigarettes  ?  Quit date: 12/29/2019  ?  Years since quitting: 1.8  ? Smokeless tobacco: Never  ?Vaping Use  ? Vaping Use: Every day  ?Substance and Sexual Activity  ? Alcohol use: Not Currently  ? Drug use: Never  ? Sexual activity: Yes  ?  Partners: Male  ?Other Topics Concern  ? Not on file  ?Social History Narrative  ? Lives at home with husband  ?   ? Fulltime: Quarry manager  ? ?Social Determinants of Health  ? ?Financial Resource Strain: Not on file  ?Food Insecurity: Not on file  ?Transportation Needs: Not on file  ?Physical Activity: Not on file  ?Stress: Not on file  ?Social Connections: Not on file  ?Intimate Partner Violence: Not on file  ? ? ?Outpatient Medications Prior to Visit  ?Medication Sig Dispense Refill  ? apixaban (ELIQUIS) 5 MG TABS tablet Take 1 tablet (5 mg total) by mouth 2 (two) times daily. 60 tablet 11  ? ARIPiprazole (ABILIFY) 10 MG tablet Take 0.5 tablets (5 mg total) by mouth daily. 45 tablet 1  ? Cholecalciferol ([employer] VIT D 5000 HIGH-POTENCY PO) Take by mouth daily in the afternoon.    ? metoprolol succinate (TOPROL-XL) 100 MG 24 hr tablet Take 1 tablet (100 mg total) by mouth daily. Take with or immediately following a meal. 90 tablet 3  ? metoprolol tartrate (LOPRESSOR) 25 MG tablet Take 1 tablet (25 mg total) by mouth 2 (two) times daily as needed. 60 tablet 0  ? mirtazapine (REMERON) 15 MG tablet Take 1 tablet (15 mg total) by mouth at bedtime. 90 tablet 1  ? pantoprazole (PROTONIX) 40 MG tablet Take 40 mg by mouth daily.    ? potassium chloride (KLOR-CON) 10 MEQ tablet Take 4 tablets (40 mEq total) by mouth daily. 360 tablet 3  ?  torsemide (DEMADEX) 20 MG tablet Take 1 tablet (20 mg total) by mouth daily. 90 tablet 3  ? tiZANidine (ZANAFLEX) 4 MG tablet Take 4 mg by mouth every 6 (six) hours.    ? ?No facility-administered medications prior to visit.  ? ? ?Allergies  ?Allergen Reactions  ? Codeine Nausea And Vomiting  ?  Confusion and could  not stay awake  ? Meloxicam Nausea Only  ?  And stomach cramping  ? Paroxetine Hcl Other (See Comments)  ?  Sleep all the time  ? ? ?ROS ?Review of Systems  ?Constitutional:  Positive for fatigue. Negative for chills and fever.  ?Respiratory:  Positive for shortness of breath (exertion). Negative for cough.   ?Cardiovascular:  Positive for leg swelling. Negative for chest pain.  ?Gastrointestinal:  Negative for abdominal pain, diarrhea, nausea and vomiting.  ?     BM daily ?  ?Genitourinary:  Negative for difficulty urinating, dysuria, hematuria, vaginal bleeding, vaginal discharge and vaginal pain.  ?Neurological:  Negative for dizziness, light-headedness, numbness and headaches.  ?Psychiatric/Behavioral:  Negative for hallucinations and suicidal ideas.   ? ?  ?Objective:  ?  ?Physical Exam ?Vitals and nursing note reviewed.  ?Constitutional:   ?   Appearance: Normal appearance.  ?HENT:  ?   Right Ear: Tympanic membrane, ear canal and external ear normal.  ?   Left Ear: Tympanic membrane, ear canal and external ear normal.  ?   Mouth/Throat:  ?   Mouth: Mucous membranes are moist.  ?   Pharynx: Oropharynx is clear.  ?Eyes:  ?   Extraocular Movements: Extraocular movements intact.  ?   Pupils: Pupils are equal, round, and reactive to light.  ?Neck:  ?   Thyroid: No thyroid mass, thyromegaly or thyroid tenderness.  ?Cardiovascular:  ?   Rate and Rhythm: Normal rate and regular rhythm.  ?   Pulses: Normal pulses.  ?   Heart sounds: Normal heart sounds.  ?Pulmonary:  ?   Effort: Pulmonary effort is normal.  ?   Breath sounds: Normal breath sounds.  ?Abdominal:  ?   General: Bowel sounds are normal. There is  no distension.  ?   Palpations: There is no mass.  ?   Tenderness: There is no abdominal tenderness.  ?   Hernia: No hernia is present.  ?Musculoskeletal:  ?   Right lower leg: No edema.  ?   Left lowe

## 2021-10-20 NOTE — Assessment & Plan Note (Signed)
Patient stopped taking vitamin D we will check vitamin D level today pending result ?

## 2021-10-20 NOTE — Assessment & Plan Note (Signed)
No HI/SI/AVH.  Patient currently maintained on Abilify 10 mg and mirtazapine 10 mg.  Did administer PHQ-9 and GAD-7 in office.  We will taper patient off mirtazapine as does not seem to be effective currently she will start by having tablet for 2 weeks and then take half a tablet every other night for 1 week then stop.  We will start her on bupropion 150 mg XR. ?

## 2021-10-20 NOTE — Assessment & Plan Note (Signed)
Vulvar smoker check UA today for red blood cells ?

## 2021-10-20 NOTE — Assessment & Plan Note (Signed)
Patient currently maintained on metoprolol and torsemide.  Continue taking medications prescribed follow-up with cardiologist as recommended ?

## 2021-10-20 NOTE — Assessment & Plan Note (Signed)
Discussed age-appropriate immunizations and screening exams orders placed ?

## 2021-10-21 LAB — CBC
HCT: 38.4 % (ref 35.0–45.0)
Hemoglobin: 13.2 g/dL (ref 11.7–15.5)
MCH: 30.3 pg (ref 27.0–33.0)
MCHC: 34.4 g/dL (ref 32.0–36.0)
MCV: 88.3 fL (ref 80.0–100.0)
MPV: 12.4 fL (ref 7.5–12.5)
Platelets: 218 10*3/uL (ref 140–400)
RBC: 4.35 10*6/uL (ref 3.80–5.10)
RDW: 12.1 % (ref 11.0–15.0)
WBC: 9.2 10*3/uL (ref 3.8–10.8)

## 2021-10-21 LAB — HEMOGLOBIN A1C
Hgb A1c MFr Bld: 5.7 % of total Hgb — ABNORMAL HIGH (ref <5.7)
Mean Plasma Glucose: 117 mg/dL
eAG (mmol/L): 6.5 mmol/L

## 2021-10-21 LAB — COMPREHENSIVE METABOLIC PANEL
AG Ratio: 1.5 (calc) (ref 1.0–2.5)
ALT: 42 U/L — ABNORMAL HIGH (ref 6–29)
AST: 35 U/L (ref 10–35)
Albumin: 4.3 g/dL (ref 3.6–5.1)
Alkaline phosphatase (APISO): 109 U/L (ref 37–153)
BUN/Creatinine Ratio: 12 (calc) (ref 6–22)
BUN: 13 mg/dL (ref 7–25)
CO2: 30 mmol/L (ref 20–32)
Calcium: 9.3 mg/dL (ref 8.6–10.4)
Chloride: 99 mmol/L (ref 98–110)
Creat: 1.09 mg/dL — ABNORMAL HIGH (ref 0.50–1.03)
Globulin: 2.9 g/dL (calc) (ref 1.9–3.7)
Glucose, Bld: 87 mg/dL (ref 65–99)
Potassium: 3.3 mmol/L — ABNORMAL LOW (ref 3.5–5.3)
Sodium: 142 mmol/L (ref 135–146)
Total Bilirubin: 0.6 mg/dL (ref 0.2–1.2)
Total Protein: 7.2 g/dL (ref 6.1–8.1)

## 2021-10-21 LAB — LIPID PANEL
Cholesterol: 168 mg/dL (ref <200)
HDL: 40 mg/dL — ABNORMAL LOW (ref >=50)
LDL Cholesterol (Calc): 97 mg/dL (calc)
Non-HDL Cholesterol (Calc): 128 mg/dL (calc) (ref <130)
Total CHOL/HDL Ratio: 4.2 (calc) (ref <5.0)
Triglycerides: 223 mg/dL — ABNORMAL HIGH (ref <150)

## 2021-10-21 LAB — TSH: TSH: 1.98 mIU/L

## 2021-10-21 LAB — VITAMIN D 25 HYDROXY (VIT D DEFICIENCY, FRACTURES): Vit D, 25-Hydroxy: 20 ng/mL — ABNORMAL LOW (ref 30–100)

## 2021-10-23 ENCOUNTER — Encounter: Payer: Self-pay | Admitting: Nurse Practitioner

## 2021-10-23 DIAGNOSIS — J439 Emphysema, unspecified: Secondary | ICD-10-CM

## 2021-10-24 ENCOUNTER — Other Ambulatory Visit: Payer: Self-pay | Admitting: Nurse Practitioner

## 2021-10-24 DIAGNOSIS — J439 Emphysema, unspecified: Secondary | ICD-10-CM

## 2021-10-24 MED ORDER — ALBUTEROL SULFATE HFA 108 (90 BASE) MCG/ACT IN AERS: 2.0000 | INHALATION_SPRAY | Freq: Four times a day (QID) | RESPIRATORY_TRACT | 2 refills | Status: DC | PRN

## 2021-10-24 MED ORDER — ALBUTEROL SULFATE 108 (90 BASE) MCG/ACT IN AEPB: 2.0000 | INHALATION_SPRAY | Freq: Four times a day (QID) | RESPIRATORY_TRACT | 1 refills | Status: DC | PRN

## 2021-11-08 ENCOUNTER — Ambulatory Visit (INDEPENDENT_AMBULATORY_CARE_PROVIDER_SITE_OTHER): Payer: BC Managed Care – PPO | Admitting: Cardiology

## 2021-11-08 ENCOUNTER — Other Ambulatory Visit: Payer: Self-pay | Admitting: Cardiology

## 2021-11-08 ENCOUNTER — Encounter: Payer: Self-pay | Admitting: Cardiology

## 2021-11-08 VITALS — BP 140/80 | HR 82 | Ht 64.75 in | Wt 228.0 lb

## 2021-11-08 DIAGNOSIS — I10 Essential (primary) hypertension: Secondary | ICD-10-CM | POA: Diagnosis not present

## 2021-11-08 DIAGNOSIS — I5032 Chronic diastolic (congestive) heart failure: Secondary | ICD-10-CM | POA: Diagnosis not present

## 2021-11-08 DIAGNOSIS — I480 Paroxysmal atrial fibrillation: Secondary | ICD-10-CM | POA: Diagnosis not present

## 2021-11-08 DIAGNOSIS — I471 Supraventricular tachycardia: Secondary | ICD-10-CM | POA: Diagnosis not present

## 2021-11-08 MED ORDER — METOPROLOL SUCCINATE ER 50 MG PO TB24: 50.0000 mg | ORAL_TABLET | Freq: Every day | ORAL | 3 refills | Status: DC

## 2021-11-08 MED ORDER — PROPAFENONE HCL ER 225 MG PO CP12: 225.0000 mg | ORAL_CAPSULE | Freq: Two times a day (BID) | ORAL | 3 refills | Status: DC

## 2021-11-08 NOTE — Patient Instructions (Addendum)
Medications: ?Start Propafenone 225 mg two times a day ?Reduce Metoprolol succinate to 50 mg daily ?Your physician recommends that you continue on your current medications as directed. Please refer to the Current Medication list given to you today. ?*If you need a refill on your cardiac medications before your next appointment, please call your pharmacy* ? ?Lab Work: ?None. ?If you have labs (blood work) drawn today and your tests are completely normal, you will receive your results only by: ?MyChart Message (if you have MyChart) OR ?A paper copy in the mail ?If you have any lab test that is abnormal or we need to change your treatment, we will call you to review the results. ? ?Testing/Procedures: ?None. ? ?Follow-Up: ?At [facility], you and your health needs are our priority.  As part of our continuing mission to provide you with exceptional heart care, we have created designated Provider Care Teams.  These Care Teams include your primary Cardiologist (physician) and Advanced Practice Providers (APPs -  Physician Assistants and Nurse Practitioners) who all work together to provide you with the care you need, when you need it. ? ?Your physician wants you to follow-up in: 7-10 day nurse visit EKG- post Propafenone start & 8 weeks with Lars Mage  ? ?We recommend signing up for the patient portal called "MyChart".  Sign up information is provided on this After Visit Summary.  MyChart is used to connect with patients for Virtual Visits (Telemedicine).  Patients are able to view lab/test results, encounter notes, upcoming appointments, etc.  Non-urgent messages can be sent to your provider as well.   ?To learn more about what you can do with MyChart, go to [email or web address].   ? ?Any Other Special Instructions Will Be Listed Below (If Applicable). ? ?Propafenone Extended-Release Capsules ?What is this medication? ?PROPAFENONE (proe pa FEEN one) prevents and treats a fast or irregular heartbeat  (arrhythmia). It is often used to treat a type of arrhythmia known as AFib (atrial fibrillation). It works by slowing down overactive electric signals in the heart, which stabilizes your heart rhythm. It belongs to a group of medications called antiarrhythmics. ?This medicine may be used for other purposes; ask your health care provider or pharmacist if you have questions. ?COMMON BRAND NAME(S): Rythmol SR ?What should I tell my care team before I take this medication? ?They need to know if you have any of these conditions: ?Heart disease ?High potassium level ?Kidney disease ?Liver disease ?Low blood pressure ?Lung or breathing disease like asthma, chronic bronchitis, or emphysema ?Pacemaker ?Slow heart rate ?An unusual or allergic reaction to propafenone, other medications, foods, dyes, or preservatives ?Pregnant or trying to get pregnant ?Breast-feeding ?How should I use this medication? ?Take this medication by mouth with a glass of water. Follow the directions on the prescription label. Swallow whole. Do not crush or chew. You can take this medication with or without food. Take your doses at regular intervals. Do not take your medication more often than directed. ?Talk to your care team regarding the use of this medication in children. Special care may be needed. ?Overdosage: If you think you have taken too much of this medicine contact a poison control center or emergency room at once. ?NOTE: This medicine is only for you. Do not share this medicine with others. ?What if I miss a dose? ?If you miss a dose, take it as soon as you can. If it is almost time for your next dose, take only that dose. Do not take double  or extra doses. ?What may interact with this medication? ?Do not take this medication with any of the following: ?Arsenic trioxide ?Certain antibiotics like clarithromycin, erythromycin, grepafloxacin, pentamidine, sparfloxacin, troleandomycin ?Certain medications for depression or mental illness like  amoxapine, haloperidol, maprotiline, pimozide, sertindole, thioridazine, tricyclic antidepressants ?Certain medications for fungal infections like fluconazole, itraconazole, ketoconazole, posaconazole, voriconazole ?Certain medications for irregular heartbeat like dronedarone ?Certain medications for malaria like chloroquine, halofantrine ?Cisapride ?Droperidol ?Levomethadyl ?Ranolazine ?This medication may also interact with the following: ?Certain medications for angina or blood pressure ?Certain medications for asthma or breathing difficulties like formoterol, salmeterol ?Certain medications that treat or prevent blood clots like warfarin ?Cimetidine ?Cyclosporine ?Digoxin ?Diuretics ?Local anesthetics ?Other medications that prolong the QT interval (cause an abnormal heart rhythm) like dofetilide, ziprasidone ?Rifampin ?Ritonavir ?Theophylline ?This list may not describe all possible interactions. Give your health care provider a list of all the medicines, herbs, non-prescription drugs, or dietary supplements you use. Also tell them if you smoke, drink alcohol, or use illegal drugs. Some items may interact with your medicine. ?What should I watch for while using this medication? ?Your condition will be monitored closely when you first begin therapy. Often, this medication is first started in a hospital or other monitored health care setting. Once you are on maintenance therapy, visit your care team for regular checks on your progress. Because your condition and use of this medication carry some risk, it is a good idea to carry an identification card, necklace or bracelet with details of your condition, medications, and care team. ?You may get drowsy or dizzy. Do not drive, use machinery, or do anything that needs mental alertness until you know how this medication affects you. Do not stand or sit up quickly, especially if you are an older patient. This reduces the risk of dizzy or fainting spells. ?If you are  going to have surgery, tell your care team that you are taking this medication. ?What side effects may I notice from receiving this medication? ?Side effects that you should report to your care team as soon as possible: ?Allergic reactions--skin rash, itching, hives, swelling of the face, lips, tongue, or throat ?Heart failure--shortness of breath, swelling of the ankles, feet, or hands, sudden weight gain, unusual weakness or fatigue ?Heart rhythm changes--fast or irregular heartbeat, dizziness, feeling faint or lightheaded, chest pain, trouble breathing ?Infection--fever, chills, cough, sore throat ?Unusual bruising or bleeding ?Side effects that usually do not require medical attention (report to your care team if they continue or are bothersome): ?Change in taste ?Constipation ?Dizziness ?Fatigue ?Nausea ?This list may not describe all possible side effects. Call your doctor for medical advice about side effects. You may report side effects to FDA at 1-800-FDA-1088. ?Where should I keep my medication? ?Keep out of the reach of children and pets. ?Store at room temperature between 15 and 30 degrees C (59 and 86 degrees F). Keep container tightly closed. Throw away any unused medication after the expiration date. ?NOTE: This sheet is a summary. It may not cover all possible information. If you have questions about this medicine, talk to your doctor, pharmacist, or health care provider. ?? 2022 Elsevier/Gold Standard (2020-11-30 00:00:00) ? ? ?

## 2021-11-08 NOTE — Progress Notes (Signed)
?Electrophysiology Office Follow up Visit Note:   ? ?Date:  11/08/2021  ? ?ID:  Victoria Williamson, DOB 10-Jan-1966, MRN [id number] ? ?PCP:  Michela Pitcher, NP  ?[facility] Cardiologist:  Kate Sable, MD  ?[facility] Electrophysiologist:  Vickie Epley, MD  ? ? ?Interval History:   ? ?Victoria Williamson is a 56 y.o. female who presents for a follow up visit. They were last seen in clinic November 23, 2020.  She had an atrial fibrillation ablation in December 2021.  After the procedure she had episodes of atrial tachycardia on heart monitor.  She was seen by [name] on August 22, 2021.  She is on [place]. ? ?She tells me that she continues to struggle with back pain.  She is having a nerve ablation done tomorrow.  She tells me that she has bursts of SVT 1-2 times per week.  She tells me these are very different than her prior atrial fibrillation episodes.  This SVT has been documented on a twelve-lead EKG in the emergency department and appears to show an atrial tachycardia.  Previous ZIO monitoring has also demonstrated atrial tachycardia.  During the episode she feels palpitations and sometimes she feels mild chest pain. ?  ? ?Past Medical History:  ?Diagnosis Date  ? Anxiety   ? Arthritis   ? Chest pain   ? normal coronary arteries by cardiac cath oct 29,2014  ? Colon polyp   ? Depression   ? Dysautonomia ([place]) 12/18/2013  ? Fatty liver   ? Heart disease   ? History of blood transfusion   ? Hypertension   ? PAF (paroxysmal atrial fibrillation) ([place])   ? Skin cancer, basal cell   ? Thyroid nodule   ? ? ?Past Surgical History:  ?Procedure Laterality Date  ? ATRIAL FIBRILLATION ABLATION N/A 07/15/2020  ? Procedure: ATRIAL FIBRILLATION ABLATION;  Surgeon: Vickie Epley, MD;  Location: [place] [facility];  Service: Cardiovascular;  Laterality: N/A;  ? btl    ? CARDIOVERSION N/A 05/11/2020  ? Procedure: CARDIOVERSION;  Surgeon: Kate Sable, MD;  Location: [facility];  Service: Cardiovascular;   Laterality: N/A;  ? CARPAL TUNNEL RELEASE Left 06/23/2020  ? Procedure: CARPAL TUNNEL RELEASE;  Surgeon: [name], MD;  Location: [place];  Service: Orthopedics;  Laterality: Left;  IV REGIONAL FOREARM BLOCK  ? ENDOMETRIAL ABLATION    ? right carpal tunel    ? ? ?Current Medications: ?Current Meds  ?Medication Sig  ? albuterol (VENTOLIN HFA) 108 (90 Base) MCG/ACT inhaler Inhale 2 puffs into the lungs every 6 (six) hours as needed for wheezing or shortness of breath.  ? apixaban (ELIQUIS) 5 MG TABS tablet Take 1 tablet (5 mg total) by mouth 2 (two) times daily.  ? ARIPiprazole (ABILIFY) 10 MG tablet Take 0.5 tablets (5 mg total) by mouth daily.  ? Cholecalciferol ([facility] VIT D 5000 HIGH-POTENCY PO) Take by mouth daily in the afternoon.  ? metoprolol succinate (TOPROL-XL) 100 MG 24 hr tablet Take 1 tablet (100 mg total) by mouth daily. Take with or immediately following a meal.  ? metoprolol tartrate (LOPRESSOR) 25 MG tablet Take 1 tablet (25 mg total) by mouth 2 (two) times daily as needed.  ? mirtazapine (REMERON) 15 MG tablet Take 1 tablet (15 mg total) by mouth at bedtime.  ? pantoprazole (PROTONIX) 40 MG tablet Take 40 mg by mouth daily.  ? potassium chloride (KLOR-CON) 10 MEQ tablet Take 4 tablets (40 mEq total) by  mouth daily.  ?  ? ?Allergies:   Codeine, Meloxicam, and Paroxetine hcl  ? ?Social History  ? ?Socioeconomic History  ? Marital status: Married  ?  Spouse name: Not on file  ? Number of children: 2  ? Years of education: Not on file  ? Highest education level: Not on file  ?Occupational History  ? Not on file  ?Tobacco Use  ? Smoking status: Former  ?  Packs/day: 1.00  ?  Years: 37.00  ?  Pack years: 37.00  ?  Types: Cigarettes  ?  Quit date: 12/29/2019  ?  Years since quitting: 1.8  ? Smokeless tobacco: Never  ?Vaping Use  ? Vaping Use: Every day  ?Substance and Sexual Activity  ? Alcohol use: Not Currently  ? Drug use: Never  ? Sexual activity: Yes  ?  Partners: Male  ?Other Topics  Concern  ? Not on file  ?Social History Narrative  ? Lives at home with husband  ?   ? Fulltime: Quarry manager  ? ?Social Determinants of Health  ? ?Financial Resource Strain: Not on file  ?Food Insecurity: Not on file  ?Transportation Needs: Not on file  ?Physical Activity: Not on file  ?Stress: Not on file  ?Social Connections: Not on file  ?  ? ?Family History: ?The patient's family history includes Alcohol abuse in her father; Arthritis in her mother; Breast cancer (age of onset: 38) in her maternal aunt; Breast cancer (age of onset: 38) in her maternal grandmother; COPD in her mother; Cancer (age of onset: 63) in her father; Depression in her paternal grandmother; Diabetes in her mother and another family member; Hearing loss in her father; Heart attack in her father and paternal grandfather; Heart attack (age of onset: 89) in her brother; Heart disease in her father, mother, and paternal grandfather; High Cholesterol in her father and mother; Hypertension in her father and mother; Kidney disease in her mother. ? ?ROS:   ?Please see the history of present illness.    ?All other systems reviewed and are negative. ? ?EKGs/Labs/Other Studies Reviewed:   ? ?The following studies were reviewed today: ? ?July 27, 2021 echo ?Normal left ventricular function, 60% ?Right ventricular function normal ?No significant valvular abnormalities ? ? ?January 22, 2020 PET myocardial perfusion at [facility] ?Impressions:  ?- Normal myocardial perfusion study  ?- No evidence for significant ischemia or scar is noted.  ?- During stress: Global systolic function is normal. The ejection fraction was greater than 65%.  ?- No significant coronary calcifications were noted on the attenuation CT.  ?- Incidentally noted on the attenuation CT scan is a dilated ascending aorta (4.3 cm in diameter) on noncontrasted CT.   ? ?EKG:  The ekg ordered today demonstrates sinus rhythm.  Normal intervals. ? ?Recent Labs: ?07/25/2021: B Natriuretic  Peptide 25.8; Magnesium 1.9 ?10/20/2021: ALT 42; BUN 13; Creat 1.09; Hemoglobin 13.2; Platelets 218; Potassium 3.3; Sodium 142; TSH 1.98  ?Recent Lipid Panel ?   ?Component Value Date/Time  ? CHOL 168 10/20/2021 1529  ? TRIG 223 (H) 10/20/2021 1529  ? HDL 40 (L) 10/20/2021 1529  ? CHOLHDL 4.2 10/20/2021 1529  ? [place] 97 10/20/2021 1529  ? ? ?Physical Exam:   ? ?VS:  BP 140/80 (BP Location: Left Arm, Patient Position: Sitting, Cuff Size: Large)   Pulse 82   Ht 5' 4.75" (1.645 m)   Wt 228 lb (103.4 kg)   SpO2 98%   BMI 38.23 kg/m?    ? ?  Wt Readings from Last 3 Encounters:  ?11/08/21 228 lb (103.4 kg)  ?10/20/21 226 lb 4 oz (102.6 kg)  ?08/22/21 230 lb (104.3 kg)  ?  ? ?GEN:  Well nourished, well developed in no acute distress ?HEENT: Normal ?NECK: No JVD; No carotid bruits ?LYMPHATICS: No lymphadenopathy ?CARDIAC: RRR, no murmurs, rubs, gallops ?RESPIRATORY:  Clear to auscultation without rales, wheezing or rhonchi  ?ABDOMEN: Soft, non-tender, non-distended ?MUSCULOSKELETAL:  No edema; No deformity  ?SKIN: Warm and dry ?NEUROLOGIC:  Alert and oriented x 3 ?PSYCHIATRIC:  Normal affect  ? ? ? ?  ? ?ASSESSMENT:   ? ?1. Paroxysmal atrial fibrillation (HCC)   ?2. SVT (supraventricular tachycardia) ([place])   ?3. Chronic diastolic CHF (congestive heart failure) ([place])   ?4. Primary hypertension   ? ?PLAN:   ? ?In order of problems listed above: ? ? ?#Paroxysmal atrial fibrillation ?No recurrence of atrial fibrillation since the ablation.  She continues to take Eliquis for stroke prophylaxis ? ?#SVT ?Likely an atrial tachycardia based on previous monitoring results.  Episodes occur 1-2 times per week.  They are symptomatic.  I would like to start propafenone to 25 mg by mouth twice daily.  I will decrease her Toprol-XL to 50 mg by mouth once daily.  I will have her come in for an EKG in 7 to 10 days.  I will see her back in about 8 weeks to assess response to therapy. ? ?#Chronic diastolic heart failure ?NYHA class II.   Warm and dry on exam.  Rhythm control indicated. ? ?#Hypertension ?Upper limit of normal today.  Continue to check at home 1-2 times per week. ? ?Follow-up with me in 6 to 8 weeks. ? ? ? ? ?Medication Adjustments/[place]

## 2021-11-08 NOTE — Telephone Encounter (Signed)
Per pharmacy  ? ?"Pharmacy comment: Script Clarification:CONTRAINDICATION: ICD10 F18.86 CHRONIC DIASTOLIC HEART FAILURE REPORTED January 11, 2020. PLEASE ADVISE PHARMACY." ?

## 2021-11-09 ENCOUNTER — Encounter: Payer: Self-pay | Admitting: Cardiology

## 2021-11-09 ENCOUNTER — Other Ambulatory Visit: Payer: Self-pay | Admitting: Cardiology

## 2021-11-10 MED ORDER — METOPROLOL SUCCINATE ER 50 MG PO TB24: 75.0000 mg | ORAL_TABLET | Freq: Two times a day (BID) | ORAL | 3 refills | Status: DC

## 2021-11-10 MED ORDER — METOPROLOL SUCCINATE ER 50 MG PO TB24: 75.0000 mg | ORAL_TABLET | Freq: Every day | ORAL | 3 refills | Status: DC

## 2021-11-10 NOTE — Telephone Encounter (Signed)
Made appt and medication changes. Patient verbalized understanding with read back.  ?

## 2021-11-16 ENCOUNTER — Encounter: Payer: Self-pay | Admitting: Physician Assistant

## 2021-11-16 ENCOUNTER — Ambulatory Visit (INDEPENDENT_AMBULATORY_CARE_PROVIDER_SITE_OTHER): Payer: BC Managed Care – PPO | Admitting: Physician Assistant

## 2021-11-16 ENCOUNTER — Encounter: Payer: Self-pay | Admitting: Nurse Practitioner

## 2021-11-16 VITALS — BP 110/80 | HR 76 | Ht 64.0 in | Wt 225.5 lb

## 2021-11-16 DIAGNOSIS — I480 Paroxysmal atrial fibrillation: Secondary | ICD-10-CM | POA: Diagnosis not present

## 2021-11-16 DIAGNOSIS — K219 Gastro-esophageal reflux disease without esophagitis: Secondary | ICD-10-CM

## 2021-11-16 DIAGNOSIS — I471 Supraventricular tachycardia: Secondary | ICD-10-CM

## 2021-11-16 DIAGNOSIS — I77810 Thoracic aortic ectasia: Secondary | ICD-10-CM | POA: Diagnosis not present

## 2021-11-16 DIAGNOSIS — I5032 Chronic diastolic (congestive) heart failure: Secondary | ICD-10-CM | POA: Diagnosis not present

## 2021-11-16 DIAGNOSIS — I10 Essential (primary) hypertension: Secondary | ICD-10-CM

## 2021-11-16 MED ORDER — PANTOPRAZOLE SODIUM 40 MG PO TBEC: 40.0000 mg | DELAYED_RELEASE_TABLET | Freq: Every day | ORAL | 0 refills | Status: DC

## 2021-11-16 NOTE — Patient Instructions (Signed)
Medication Instructions:  ?- Your physician recommends that you continue on your current medications as directed. Please refer to the Current Medication list given to you today. ? ?*If you need a refill on your cardiac medications before your next appointment, please call your pharmacy* ? ? ?Lab Work: ?- none ordered ? ?If you have labs (blood work) drawn today and your tests are completely normal, you will receive your results only by: ?MyChart Message (if you have MyChart) OR ?A paper copy in the mail ?If you have any lab test that is abnormal or we need to change your treatment, we will call you to review the results. ? ? ?Testing/Procedures: ?- none ordered ? ? ?Follow-Up: ?At [facility], you and your health needs are our priority.  As part of our continuing mission to provide you with exceptional heart care, we have created designated Provider Care Teams.  These Care Teams include your primary Cardiologist (physician) and Advanced Practice Providers (APPs -  Physician Assistants and Nurse Practitioners) who all work together to provide you with the care you need, when you need it. ? ?We recommend signing up for the patient portal called "MyChart".  Sign up information is provided on this After Visit Summary.  MyChart is used to connect with patients for Virtual Visits (Telemedicine).  Patients are able to view lab/test results, encounter notes, upcoming appointments, etc.  Non-urgent messages can be sent to your provider as well.   ?To learn more about what you can do with MyChart, go to [email or web address].   ? ?Your next appointment:   ?6 month(s) ? ?The format for your next appointment:   ?In Person ? ?Provider:   ?You may see Kate Sable, MD or one of the following Advanced Practice Providers on your designated Care Team:   ? ?[name], PA-C ? ? ? ?Other Instructions ?N/a ? ?Important Information About Sugar ? ? ? ? ? ? ?

## 2021-11-16 NOTE — Progress Notes (Signed)
? ?Cardiology Office Note   ? ?Date:  11/16/2021  ? ?ID:  Gwen Vonita Moss, DOB 08/08/1965, MRN [id number] ? ?PCP:  Michela Pitcher, NP  ?Cardiologist:  Kate Sable, MD  ?Electrophysiologist:  Vickie Epley, MD  ? ?Chief Complaint: Follow-up ? ?History of Present Illness:  ? ?Victoria Williamson is a 56 y.o. female with history of persistent A. fib status post PVI in 07/2020, SVT, HFpEF, HTN, ectatic ascending aorta, COVID in 08/2020, and obesity who presents for follow-up of HFpEF, SVT, and A. fib. ?  ?Prior documentation indicates she underwent LHC in 2014 and was told she had normal coronary arteries.  She was initially diagnosed with A. fib in 09/2019 and had been maintained on Eliquis and flecainide, though the latter led to xerostomia and difficulty sleeping leading to its discontinuation.  Echo in 12/2019 demonstrated an EF of 55% with grade 2 diastolic dysfunction.  PET MPI in 01/2020 showed no evidence of ischemia.  She was subsequently evaluated by EP and underwent A. fib PVI on 07/15/2020.  With palpitations, she underwent outpatient cardiac monitoring which showed an average heart rate of 79 bpm with a range of 54 to 190 bpm, 7 episodes of SVT with the longest episode lasting 1 hour and 33 minutes with an average rate of 128 bpm and no evidence of A. fib.  Given this, she was initiated on Toprol-XL. ?  ?She was seen in the office in 03/2021 with her weight up 14 pounds when compared to her visit in our office in 11/2020.  She reported lower extremity swelling.  She was maintaining sinus rhythm.  She was advised to take Lasix 20 mg daily as needed for edema. ?  ?She was seen in the office on 07/18/2021 noting increased lower extremity swelling that had been present since 04/2021, and appeared to be getting worse.  She did note having been on several courses of prednisone for back pain during the above time span.  She also noted some bradycardia/hypotension associated with muscle relaxer.  She was  started on torsemide 20 mg daily in place of furosemide.  Echo was recommended, though she presented to the ED on 12/20, prior to completion of echo, with chest pressure, shortness of breath, nausea, and palpitations.  She was noted to be in SVT and hypokalemic with a potassium of 2.9.  Magnesium 1.9.  High-sensitivity troponin normal x2, BNP 25, COVID and influenza negative, TSH normal, chest x-ray without active disease with noted emphysema potassium was repleted and she was given metoprolol with noted conversion to sinus rhythm and improvement in symptoms.  Echo was subsequently completed on 07/27/2021 and demonstrated an EF of 60 to 65%, no regional wall motion abnormalities, mild LVH, normal LV diastolic function parameters, normal RV systolic function and ventricular cavity size, mildly elevated PASP with an estimated pressure of 36.8 mmHg, no significant valvular abnormalities, and borderline dilatation of the ascending aorta measuring 38 mm. ? ?She was seen in the office in 08/2021 and was doing well, without symptoms of angina or decompensation.  Volume status was improved on torsemide.  She did continue to struggle with back pain.  She was maintaining sinus rhythm.  She was seen by EP in follow-up on 11/08/2021.  She reported bursts of SVT 1-2 times per week that felt very different than her prior episodes of A-fib.  Given these episodes of SVT/atrial tachycardia, she was initiated on propafenone 25 mg twice daily and Toprol was decreased to 50 mg daily.  However, she was subsequently instructed to not take propafenone given medication interactions.  Given this, metoprolol was transitioned to 75 mg twice daily with recommendation to follow-up today. ? ?She comes in doing reasonably well from a cardiac perspective.  She does continue to note chronic low back pain and underwent nerve ablation, though has not noticed a significant improvement in her pain with this.  She is currently back on prednisone due to her  back pain.  Since titrating Toprol-XL she has not noted any significant tachypalpitations this past week.  She has been taking Toprol-XL 75 mg once daily rather than the previously recommended twice daily.  At baseline, she reports her blood pressure is typically in the low 314H to 70Y systolic.  She also noted that the patient instructions from [facility], or incorrect indicating if she was to take 4 tabs twice daily as needed rather than the recommended 1 tab twice daily.  Her weight is down 5 pounds today compared to when I last saw her.  She does feel like her ankles are a little swollen.  No falls, hematochezia, or melena.  No presyncope or syncope. ? ? ?Labs independently reviewed: ?10/2021 - TC 168, TG 223, HDL 40, LDL 97, TSH normal, A1c 5.7, BUN 13, serum creatinine 1.09, potassium 3.3, albumin 4.3, AST normal, ALT 42, Hgb 13.2, PLT 218 ? ?Past Medical History:  ?Diagnosis Date  ? Anxiety   ? Arthritis   ? Chest pain   ? normal coronary arteries by cardiac cath oct 29,2014  ? Colon polyp   ? Depression   ? Dysautonomia ([place]) 12/18/2013  ? Fatty liver   ? Heart disease   ? History of blood transfusion   ? Hypertension   ? PAF (paroxysmal atrial fibrillation) ([place])   ? Skin cancer, basal cell   ? Thyroid nodule   ? ? ?Past Surgical History:  ?Procedure Laterality Date  ? ATRIAL FIBRILLATION ABLATION N/A 07/15/2020  ? Procedure: ATRIAL FIBRILLATION ABLATION;  Surgeon: Vickie Epley, MD;  Location: [place] [facility];  Service: Cardiovascular;  Laterality: N/A;  ? btl    ? CARDIOVERSION N/A 05/11/2020  ? Procedure: CARDIOVERSION;  Surgeon: Kate Sable, MD;  Location: [facility];  Service: Cardiovascular;  Laterality: N/A;  ? CARPAL TUNNEL RELEASE Left 06/23/2020  ? Procedure: CARPAL TUNNEL RELEASE;  Surgeon: [name], MD;  Location: [place];  Service: Orthopedics;  Laterality: Left;  IV REGIONAL FOREARM BLOCK  ? ENDOMETRIAL ABLATION    ? right carpal tunel     ? ? ?Current Medications: ?Current Meds  ?Medication Sig  ? albuterol (VENTOLIN HFA) 108 (90 Base) MCG/ACT inhaler Inhale 2 puffs into the lungs every 6 (six) hours as needed for wheezing or shortness of breath.  ? apixaban (ELIQUIS) 5 MG TABS tablet Take 1 tablet (5 mg total) by mouth 2 (two) times daily.  ? ARIPiprazole (ABILIFY) 10 MG tablet Take 0.5 tablets (5 mg total) by mouth daily.  ? Cholecalciferol ([facility] VIT D 5000 HIGH-POTENCY PO) Take by mouth daily in the afternoon.  ? methylPREDNISolone (MEDROL DOSEPAK) 4 MG TBPK tablet Take 4 mg by mouth.  ? metoprolol succinate (TOPROL-XL) 50 MG 24 hr tablet Take by mouth. Take 1.5 tablets (75 mg) by mouth once daily. Take with or immediately following a meal.  ? metoprolol tartrate (LOPRESSOR) 25 MG tablet Take 1 tablet (25 mg total) by mouth 2 (two) times daily as needed.  ? mirtazapine (REMERON) 15 MG  tablet Take 1 tablet (15 mg total) by mouth at bedtime.  ? pantoprazole (PROTONIX) 40 MG tablet Take 40 mg by mouth daily.  ? potassium chloride (KLOR-CON) 10 MEQ tablet Take 4 tablets (40 mEq total) by mouth daily.  ? torsemide (DEMADEX) 20 MG tablet Take 1 tablet (20 mg total) by mouth daily.  ? [DISCONTINUED] metoprolol succinate (TOPROL-XL) 50 MG 24 hr tablet Take 1.5 tablets (75 mg total) by mouth 2 (two) times daily. Take with or immediately following a meal.  ? ? ?Allergies:   Codeine, Meloxicam, and Paroxetine hcl  ? ?Social History  ? ?Socioeconomic History  ? Marital status: Married  ?  Spouse name: Not on file  ? Number of children: 2  ? Years of education: Not on file  ? Highest education level: Not on file  ?Occupational History  ? Not on file  ?Tobacco Use  ? Smoking status: Former  ?  Packs/day: 1.00  ?  Years: 37.00  ?  Pack years: 37.00  ?  Types: Cigarettes  ?  Quit date: 12/29/2019  ?  Years since quitting: 1.8  ? Smokeless tobacco: Never  ?Vaping Use  ? Vaping Use: Some days  ?Substance and Sexual Activity  ? Alcohol use: Not Currently  ? Drug use:  Never  ? Sexual activity: Yes  ?  Partners: Male  ?Other Topics Concern  ? Not on file  ?Social History Narrative  ? Lives at home with husband  ?   ? Fulltime: Quarry manager  ? ?Social Determinants of Health  ? ?Fi

## 2021-11-16 NOTE — Progress Notes (Signed)
[facility]ed [facility] and spoke with Thurmond Butts. They stated she picked up prescription earlier this week and the label instructed to take 1 tablet once a day. Requested to please remove all Metoprolol succinate prescriptions and update her profile to reflect 75 mg once a day.  ?

## 2021-11-17 ENCOUNTER — Ambulatory Visit: Payer: BC Managed Care – PPO

## 2021-11-22 ENCOUNTER — Encounter: Payer: Self-pay | Admitting: Nurse Practitioner

## 2021-11-28 ENCOUNTER — Other Ambulatory Visit: Payer: Self-pay | Admitting: Physician Assistant

## 2021-11-28 DIAGNOSIS — I471 Supraventricular tachycardia: Secondary | ICD-10-CM

## 2021-11-30 ENCOUNTER — Encounter: Payer: Self-pay | Admitting: Nurse Practitioner

## 2021-11-30 NOTE — Telephone Encounter (Signed)
Spoke with patient. Has noticed some blood on pads she has been wearing due to having to urinate a lot in case she drips for the first time a week ago, has not seen blood daily. No blood clots. Has seen blood on the pad about 4 different times within a week. Not a lot of amount but enough to notice. Some fainted tent of blood on the toilet paper when she uses the bathroom. Has been having pain in the left side of the body the past 2 days off and on. No vomiting. ?Scheduled appointment with Romilda Garret for tomorrow 12/01/21. ER precautions given. ?

## 2021-12-01 ENCOUNTER — Ambulatory Visit: Payer: BC Managed Care – PPO | Admitting: Nurse Practitioner

## 2021-12-01 VITALS — BP 114/64 | HR 80 | Temp 97.0°F | Resp 12 | Ht 64.0 in | Wt 232.0 lb

## 2021-12-01 DIAGNOSIS — N939 Abnormal uterine and vaginal bleeding, unspecified: Secondary | ICD-10-CM | POA: Diagnosis not present

## 2021-12-01 DIAGNOSIS — R319 Hematuria, unspecified: Secondary | ICD-10-CM

## 2021-12-01 LAB — POCT URINALYSIS DIPSTICK
Bilirubin, UA: NEGATIVE
Blood, UA: NEGATIVE
Glucose, UA: NEGATIVE
Ketones, UA: NEGATIVE
Leukocytes, UA: NEGATIVE
Nitrite, UA: NEGATIVE
Protein, UA: NEGATIVE
Spec Grav, UA: 1.015 (ref 1.010–1.025)
Urobilinogen, UA: 1 E.U./dL
pH, UA: 5.5 (ref 5.0–8.0)

## 2021-12-01 NOTE — Assessment & Plan Note (Addendum)
Given patient's current complaint and description of blood on the pad did do pelvic exam in office could not find source of bleeding.  Patient has had endometrial ablation in the past.  Do want patient to follow-up with GYN to make sure she is not having abnormal uterine bleeding.  Ambulatory referral placed.  Did discuss this with patient she will follow-up as needed with me.  Defer blood work today did discuss this with patient ? ?Did discuss the possibility of vaginal atrophy playing a role given patient's age.  Will refer to GYN for further management ?

## 2021-12-01 NOTE — Progress Notes (Signed)
? ?Acute Office Visit ? ?Subjective:  ? ?  ?Patient ID: Charlane Vonita Moss, female    DOB: 29-Jul-1966, 56 y.o.   MRN: [id number] ? ?Chief Complaint  ?Patient presents with  ? Abdominal Pain  ?  On left side, has noticed some blood on pads she wears daily about 4 times in the past week. Some burning and pressure with urination.  ? ? ? ?Patient is in today for abdominal pain/flank pain ? ?Pain that started yesterday that it felt like someone was stabbing her and was intermittent. No pains today. History of kidney infections in the past but no kidney. ? ?No longer having menstrual periods but described some blood on pad that she wears currently.  4 times in the past week per patient report.  Seems to be about 2 to 2-1/2 inches in length and less than half a minute each in diameter.  Patient describes as varying in color from a dark brown to bright red.  Patient has had endometrial ablation in the past and does not normally have menstrual periods.  No inciting event per patient report.  No recent intercourse or injury per patient report. ? ?Review of Systems  ?Constitutional:  Negative for chills and fever.  ?Respiratory:  Negative for shortness of breath.   ?Gastrointestinal:  Negative for abdominal pain, blood in stool, melena, nausea and vomiting.  ?     BM last night ?BM daily ?Can have runny  ?Genitourinary:  Positive for flank pain and hematuria. Negative for dysuria and frequency.  ?     Pressure feeling down low ? ? ?  ? ? ?   ?Objective:  ?  ?BP 114/64   Pulse 80   Temp (!) 97 ?F (36.1 ?C)   Resp 12   Ht '5\' 4"'$  (1.626 m)   Wt 232 lb (105.2 kg)   SpO2 94%   BMI 39.82 kg/m?  ? ? ?Physical Exam ?Vitals and nursing note reviewed. Exam conducted with a chaperone present [facility] [place], RMA).  ?Constitutional:   ?   Appearance: She is well-developed. She is obese.  ?Cardiovascular:  ?   Rate and Rhythm: Normal rate and regular rhythm.  ?   Heart sounds: Normal heart sounds.  ?Pulmonary:  ?   Effort:  Pulmonary effort is normal.  ?   Breath sounds: Normal breath sounds.  ?Abdominal:  ?   General: Bowel sounds are normal. There is no distension.  ?   Palpations: There is no mass.  ?   Tenderness: There is no abdominal tenderness. There is no right CVA tenderness or left CVA tenderness.  ?   Hernia: No hernia is present.  ? ? ?Genitourinary: ?   Exam position: Lithotomy position.  ?   Vagina: Normal.  ?   Cervix: Normal.  ?   Rectum: Normal. No mass, tenderness, anal fissure, external hemorrhoid or internal hemorrhoid. Normal anal tone.  ?   Comments: Patient did not have any stool in rectal vault for an occult stool office ?Skin: ?   General: Skin is warm.  ?Neurological:  ?   Mental Status: She is alert.  ? ? ?Results for orders placed or performed in visit on 12/01/21  ?POCT urinalysis dipstick  ?Result Value Ref Range  ? Color, UA yellow   ? Clarity, UA clear   ? Glucose, UA Negative Negative  ? Bilirubin, UA negative   ? Ketones, UA negative   ? Spec Grav, UA 1.015 1.010 - 1.025  ? Blood,  UA negative   ? pH, UA 5.5 5.0 - 8.0  ? Protein, UA Negative Negative  ? Urobilinogen, UA 1.0 0.2 or 1.0 E.U./dL  ? Nitrite, UA negative   ? Leukocytes, UA Negative Negative  ? Appearance    ? Odor    ? ? ? ?   ?Assessment & Plan:  ? ?Problem List Items Addressed This Visit   ? ?  ? Other  ? Hematuria - Primary  ?  Patient was concerned because she was experiencing blood on her pad approximately 2 to 3 inches in length and less than half an inch diameter.  Patient's UA in office was negative for blood no history of kidney stone.  No pain on exam today. ? ?  ?  ? Relevant Orders  ? POCT urinalysis dipstick (Completed)  ? Abnormal vaginal bleeding  ?  Given patient's current complaint and description of blood on the pad did do pelvic exam in office could not find source of bleeding.  Patient has had endometrial ablation in the past.  Do want patient to follow-up with GYN to make sure she is not having abnormal uterine bleeding.   Ambulatory referral placed.  Did discuss this with patient she will follow-up as needed with me.  Defer blood work today did discuss this with patient ? ?  ?  ? Relevant Orders  ? Ambulatory referral to Gynecology  ? ? ?No orders of the defined types were placed in this encounter. ? ? ?Return if symptoms worsen or fail to improve. ? ?Romilda Garret, NP ? ? ?

## 2021-12-01 NOTE — Assessment & Plan Note (Signed)
Patient was concerned because she was experiencing blood on her pad approximately 2 to 3 inches in length and less than half an inch diameter.  Patient's UA in office was negative for blood no history of kidney stone.  No pain on exam today. ?

## 2021-12-01 NOTE — Patient Instructions (Signed)
Nice to see you today ?I have placed a referral to GYN to make sure we are not missing something ?Follow up if no improvement ?We will defer labs today ?

## 2021-12-07 ENCOUNTER — Encounter: Payer: Self-pay | Admitting: Nurse Practitioner

## 2021-12-10 ENCOUNTER — Other Ambulatory Visit: Payer: Self-pay | Admitting: Cardiology

## 2021-12-15 ENCOUNTER — Ambulatory Visit: Payer: BC Managed Care – PPO | Admitting: Nurse Practitioner

## 2021-12-24 ENCOUNTER — Other Ambulatory Visit: Payer: Self-pay | Admitting: Nurse Practitioner

## 2021-12-24 ENCOUNTER — Other Ambulatory Visit: Payer: Self-pay | Admitting: Cardiology

## 2021-12-25 NOTE — Telephone Encounter (Signed)
Age 56, weight 105kg, SCr 1.09 on 10/2021 Last OV 11/2021, afib indication

## 2021-12-27 ENCOUNTER — Ambulatory Visit (INDEPENDENT_AMBULATORY_CARE_PROVIDER_SITE_OTHER): Payer: BC Managed Care – PPO | Admitting: Cardiology

## 2021-12-27 ENCOUNTER — Encounter: Payer: Self-pay | Admitting: Cardiology

## 2021-12-27 VITALS — BP 111/75 | HR 76 | Ht 64.0 in | Wt 224.0 lb

## 2021-12-27 DIAGNOSIS — I471 Supraventricular tachycardia, unspecified: Secondary | ICD-10-CM

## 2021-12-27 DIAGNOSIS — I5032 Chronic diastolic (congestive) heart failure: Secondary | ICD-10-CM | POA: Diagnosis not present

## 2021-12-27 DIAGNOSIS — I480 Paroxysmal atrial fibrillation: Secondary | ICD-10-CM | POA: Diagnosis not present

## 2021-12-27 NOTE — Progress Notes (Signed)
Electrophysiology Office Follow up Visit Note:    Date:  12/27/2021   ID:  Victoria Williamson, DOB 10/15/1965, MRN [id number]  PCP:  Victoria Pitcher, NP  [facility] Cardiologist:  Victoria Sable, MD  [facility] Electrophysiologist:  Victoria Epley, MD    Interval History:    Victoria Williamson is a 56 y.o. female who presents for a follow up visit.  She previously had an atrial fibrillation ablation on July 15, 2020.  During that ablation the pulmonary veins and CTI were ablated.  I last saw the patient November 08, 2021.  At that appointment she reported episodes of SVT occurring 1-2 times per week.  These episodes were different than her prior atrial fibrillation episodes.  The SVT had been previously documented on an EKG in the emergency department.  I initially wanted to prescribe propafenone but due to an interaction with Abilify and mirtazapine, this was not unable to be started safely.  I increased her metoprolol and at the follow-up appointment with Victoria Williamson on April 13, she seems to be doing well.  Today she is doing well.  No sustained episodes of abnormal heart rhythms.  She thinks the prior episodes of rapid heart rate were in the setting of a recent COVID infection which I think is plausible.  She continues to have back pain despite a "nerve ablation".  She is planning to have back surgery soon.     Past Medical History:  Diagnosis Date   Anxiety    Arthritis    Chest pain    normal coronary arteries by cardiac cath oct 29,2014   Colon polyp    Depression    Dysautonomia ([place]) 12/18/2013   Fatty liver    Heart disease    History of blood transfusion    Hypertension    PAF (paroxysmal atrial fibrillation) ([place])    Skin cancer, basal cell    Thyroid nodule     Past Surgical History:  Procedure Laterality Date   ATRIAL FIBRILLATION ABLATION N/A 07/15/2020   Procedure: ATRIAL FIBRILLATION ABLATION;  Surgeon: Victoria Epley, MD;  Location: [place]  CV LAB;  Service: Cardiovascular;  Laterality: N/A;   btl     CARDIOVERSION N/A 05/11/2020   Procedure: CARDIOVERSION;  Surgeon: Victoria Sable, MD;  Location: [place] [facility];  Service: Cardiovascular;  Laterality: N/A;   CARPAL TUNNEL RELEASE Left 06/23/2020   Procedure: CARPAL TUNNEL RELEASE;  Surgeon: [name], MD;  Location: [place];  Service: Orthopedics;  Laterality: Left;  IV REGIONAL FOREARM BLOCK   ENDOMETRIAL ABLATION     right carpal tunel      Current Medications: Current Meds  Medication Sig   albuterol (VENTOLIN HFA) 108 (90 Base) MCG/ACT inhaler Inhale 2 puffs into the lungs every 6 (six) hours as needed for wheezing or shortness of breath.   ARIPiprazole (ABILIFY) 10 MG tablet Take 0.5 tablets (5 mg total) by mouth daily.   Cholecalciferol (CVS VIT D 5000 HIGH-POTENCY PO) Take by mouth daily in the afternoon.   ELIQUIS 5 MG TABS tablet TAKE 1 TABLET BY MOUTH TWICE A DAY   metoprolol succinate (TOPROL-XL) 50 MG 24 hr tablet Take 50 mg by mouth in the morning and at bedtime.   metoprolol tartrate (LOPRESSOR) 25 MG tablet TAKE 1 TABLET BY MOUTH 2 TIMES DAILY AS NEEDED.   mirtazapine (REMERON) 15 MG tablet TAKE 1 TABLET BY MOUTH EVERYDAY AT BEDTIME   pantoprazole (PROTONIX) 40 MG tablet Take 1  tablet (40 mg total) by mouth daily.   potassium chloride (KLOR-CON) 10 MEQ tablet Take 4 tablets (40 mEq total) by mouth daily.   torsemide (DEMADEX) 20 MG tablet Take 1 tablet (20 mg total) by mouth daily.     Allergies:   Codeine, Meloxicam, and Paroxetine hcl   Social History   Socioeconomic History   Marital status: Married    Spouse name: Not on file   Number of children: 2   Years of education: Not on file   Highest education level: Not on file  Occupational History   Not on file  Tobacco Use   Smoking status: Former    Packs/day: 1.00    Years: 37.00    Pack years: 37.00    Types: Cigarettes    Quit date: 12/29/2019    Years since quitting: 1.9    Smokeless tobacco: Never  Vaping Use   Vaping Use: Some days  Substance and Sexual Activity   Alcohol use: Not Currently   Drug use: Never   Sexual activity: Yes    Partners: Male  Other Topics Concern   Not on file  Social History Narrative   Lives at home with husband      Fulltime: Quarry manager   Social Determinants of Health   Financial Resource Strain: Not on file  Food Insecurity: Not on file  Transportation Needs: Not on file  Physical Activity: Not on file  Stress: Not on file  Social Connections: Not on file     Family History: The patient's family history includes Alcohol abuse in her father; Arthritis in her mother; Breast cancer (age of onset: 32) in her maternal aunt; Breast cancer (age of onset: 80) in her maternal grandmother; COPD in her mother; Cancer (age of onset: 61) in her father; Depression in her paternal grandmother; Diabetes in her mother and another family member; Hearing loss in her father; Heart attack in her father and paternal grandfather; Heart attack (age of onset: 53) in her brother; Heart disease in her father, mother, and paternal grandfather; High Cholesterol in her father and mother; Hypertension in her father and mother; Kidney disease in her mother.  ROS:   Please see the history of present illness.    All other systems reviewed and are negative.  EKGs/Labs/Other Studies Reviewed:    The following studies were reviewed today:   EKG:  The ekg ordered today demonstrates sinus rhythm.  QTc 453 ms.  Recent Labs: 07/25/2021: B Natriuretic Peptide 25.8; Magnesium 1.9 10/20/2021: ALT 42; BUN 13; Creat 1.09; Hemoglobin 13.2; Platelets 218; Potassium 3.3; Sodium 142; TSH 1.98  Recent Lipid Panel    Component Value Date/Time   CHOL 168 10/20/2021 1529   TRIG 223 (H) 10/20/2021 1529   HDL 40 (L) 10/20/2021 1529   CHOLHDL 4.2 10/20/2021 1529   LDLCALC 97 10/20/2021 1529    Physical Exam:    VS:  BP 111/75   Pulse 76   Ht '5\' 4"'$  (1.626 m)    Wt 224 lb (101.6 kg)   SpO2 96%   BMI 38.45 kg/m     Wt Readings from Last 3 Encounters:  12/27/21 224 lb (101.6 kg)  12/01/21 232 lb (105.2 kg)  11/16/21 225 lb 8 oz (102.3 kg)     GEN:  Well nourished, well developed in no acute distress.  Obese HEENT: Normal NECK: No JVD; No carotid bruits LYMPHATICS: No lymphadenopathy CARDIAC: RRR, no murmurs, rubs, gallops RESPIRATORY:  Clear to auscultation without rales, wheezing  or rhonchi  ABDOMEN: Soft, non-tender, non-distended MUSCULOSKELETAL:  No edema; No deformity  SKIN: Warm and dry NEUROLOGIC:  Alert and oriented x 3 PSYCHIATRIC:  Normal affect        ASSESSMENT:    1. Chronic diastolic CHF (congestive heart failure) (HCC)   2. Paroxysmal atrial fibrillation ([place])   3. SVT (supraventricular tachycardia) (HCC)    PLAN:    In order of problems listed above:   #Paroxysmal atrial fibrillation Maintaining sinus rhythm after her ablation procedure.  She should continue taking Eliquis.  Okay to hold the Eliquis 2 to 3 days prior to the upcoming back surgery and restarted when felt safe from a surgical perspective.  She should continue taking metoprolol.  #Chronic diastolic heart failure NYHA class II today.  Warm and dry on exam today.  Continue torsemide.   Follow-up 1 year or sooner as needed.  Message sent to primary care and primary cardiologist.  Medication Adjustments/Labs and Tests Ordered: Current medicines are reviewed at length with the patient today.  Concerns regarding medicines are outlined above.  Orders Placed This Encounter  Procedures   EKG 12-Lead   No orders of the defined types were placed in this encounter.    Signed, Lars Mage, MD, [facility], [facility] 12/27/2021 4:40 PM    Electrophysiology [facility]

## 2021-12-27 NOTE — Patient Instructions (Signed)
Medication Instructions:   Your physician recommends that you continue on your current medications as directed. Please refer to the Current Medication list given to you today.   *If you need a refill on your cardiac medications before your next appointment, please call your pharmacy*   Lab Work: [place]   If you have labs (blood work) drawn today and your tests are completely normal, you will receive your results only by: [place] (if you have MyChart) OR A paper copy in the mail If you have any lab test that is abnormal or we need to change your treatment, we will call you to review the results.   Testing/Procedures: NONE ORDERED  TODAY     Follow-Up: At [facility], you and your health needs are our priority.  As part of our continuing mission to provide you with exceptional heart care, we have created designated Provider Care Teams.  These Care Teams include your primary Cardiologist (physician) and Advanced Practice Providers (APPs -  Physician Assistants and Nurse Practitioners) who all work together to provide you with the care you need, when you need it.  We recommend signing up for the patient portal called "MyChart".  Sign up information is provided on this After Visit Summary.  MyChart is used to connect with patients for Virtual Visits (Telemedicine).  Patients are able to view lab/test results, encounter notes, upcoming appointments, etc.  Non-urgent messages can be sent to your provider as well.   To learn more about what you can do with MyChart, go to [email or web address].    Your next appointment:   1 year(s)  The format for your next appointment:   In Person  Provider:   Lars Mage, MD    Other Instructions   Important Information About [place]

## 2022-01-17 ENCOUNTER — Ambulatory Visit: Payer: BC Managed Care – PPO | Admitting: Cardiology

## 2022-01-22 ENCOUNTER — Ambulatory Visit: Payer: BC Managed Care – PPO | Admitting: Family Medicine

## 2022-01-31 ENCOUNTER — Other Ambulatory Visit: Payer: Self-pay | Admitting: Neurosurgery

## 2022-02-05 ENCOUNTER — Telehealth: Payer: Self-pay | Admitting: Nurse Practitioner

## 2022-02-05 NOTE — Telephone Encounter (Signed)
Patient advised. Patient states that she just gets a little queasy on her stomach with anesthesia but nothing else and no vomiting

## 2022-02-05 NOTE — Telephone Encounter (Signed)
Left message to call back  

## 2022-02-05 NOTE — Telephone Encounter (Signed)
Can we reach out to Victoria Williamson [facility] and let her know that I got the form for her upcoming back surgery. They are going to do general anesthesia.  Has she had any trouble with anesthesia in the past?  If no I have ran her information through a risk calculator and she is below average risk for most complications her highest risk is possible surgical site infection but that is only a 2.0 percent chance.  I got the note from Dr. Quentin Ore about holding her blood thinner. I am going to make sure he is good for her to have surgery . If so we can clear her for the procedure

## 2022-02-08 ENCOUNTER — Telehealth: Payer: Self-pay | Admitting: Nurse Practitioner

## 2022-02-08 NOTE — Telephone Encounter (Signed)
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 I see you state that it is ok to hold her anticoagulation. I also assume you feel she is safe to proceed with the procedure. Just wanted to make sure prior to clearing her.  Thanks, [employer]

## 2022-02-12 NOTE — Telephone Encounter (Signed)
Called and informed pt that forms has been faxed.

## 2022-02-12 NOTE — Telephone Encounter (Signed)
I signed her surgical clearance form for her to have her back surgery.

## 2022-02-23 ENCOUNTER — Other Ambulatory Visit: Payer: Self-pay | Admitting: Primary Care

## 2022-02-23 DIAGNOSIS — K219 Gastro-esophageal reflux disease without esophagitis: Secondary | ICD-10-CM

## 2022-03-12 ENCOUNTER — Ambulatory Visit (HOSPITAL_COMMUNITY): Admission: RE | Admit: 2022-03-12 | Payer: BC Managed Care – PPO | Source: Home / Self Care | Admitting: Neurosurgery

## 2022-03-12 ENCOUNTER — Ambulatory Visit: Payer: BC Managed Care – PPO | Admitting: Family Medicine

## 2022-03-12 ENCOUNTER — Encounter (HOSPITAL_COMMUNITY): Admission: RE | Payer: Self-pay | Source: Home / Self Care

## 2022-03-12 SURGERY — POSTERIOR LUMBAR FUSION 1 LEVEL
Anesthesia: General

## 2022-03-29 ENCOUNTER — Other Ambulatory Visit: Payer: Self-pay | Admitting: Cardiology

## 2022-04-08 ENCOUNTER — Encounter: Payer: Self-pay | Admitting: Emergency Medicine

## 2022-04-08 ENCOUNTER — Emergency Department
Admission: EM | Admit: 2022-04-08 | Discharge: 2022-04-08 | Disposition: A | Discharge status: Home / Self Care | Payer: BC Managed Care – PPO | Attending: Emergency Medicine | Admitting: Emergency Medicine

## 2022-04-08 ENCOUNTER — Other Ambulatory Visit: Payer: Self-pay

## 2022-04-08 DIAGNOSIS — H9201 Otalgia, right ear: Secondary | ICD-10-CM | POA: Diagnosis present

## 2022-04-08 DIAGNOSIS — I509 Heart failure, unspecified: Secondary | ICD-10-CM | POA: Insufficient documentation

## 2022-04-08 DIAGNOSIS — I110 Hypertensive heart disease with heart failure: Secondary | ICD-10-CM | POA: Diagnosis not present

## 2022-04-08 MED ORDER — NEOMYCIN-POLYMYXIN-HC 3.5-10000-1 OT SOLN: 3.0000 [drp] | Freq: Four times a day (QID) | OTIC | 0 refills | Status: AC

## 2022-04-08 NOTE — Discharge Instructions (Addendum)
Follow-up with your primary care provider if any continued problems.  You may also take Tylenol or ibuprofen as needed for ear pain.  The eardrops also have cortisone in it which should help with the itching.

## 2022-04-08 NOTE — ED Provider Notes (Signed)
[facility] Provider Note    Event Date/Time   First MD Initiated Contact with Patient 04/08/22 [phone]     (approximate)   History   Otalgia   HPI  Victoria Williamson is a 56 y.o. female   presents to the ED with complaint of right ear itching and now is painful.  Patient states that originally both ears were itching and she has been scratching at it with her finger.  She denies any objects being placed in her ear.  She denies any fever, chills, upper respiratory symptoms.  No drainage from her ear.  Patient has a history of SVT, hypertension, CHF, anxiety and depression.      Physical Exam   Triage Vital Signs: ED Triage Vitals  Enc Vitals Group     BP 04/08/22 0844 (!) 148/88     Pulse Rate 04/08/22 0844 68     Resp 04/08/22 0844 16     Temp 04/08/22 0844 (!) 97.5 F (36.4 C)     Temp Source 04/08/22 0844 Oral     SpO2 04/08/22 0844 97 %     Weight 04/08/22 0843 224 lb 13.9 oz (102 kg)     Height 04/08/22 0843 '5\' 4"'$  (1.626 m)     Head Circumference --      Peak Flow --      Pain Score 04/08/22 0843 10     Pain Loc --      Pain Edu? --      Excl. in [place]? --     Most recent vital signs: Vitals:   04/08/22 0844 04/08/22 0940  BP: (!) 148/88 (!) 161/88  Pulse: 68 (!) 57  Resp: 16 16  Temp: (!) 97.5 F (36.4 C)   SpO2: 97% 97%     General: Awake, no distress.  CV:  Good peripheral perfusion.  Resp:  Normal effort.  Abd:  No distention.  Other:  Left EAC appears normal and TM without erythema.  Right EAC shows irritation and some superficial abrasions but no active bleeding noted.  TM also without erythema or injection.  No cervical lymphadenopathy is present.   ED Results / Procedures / Treatments   Labs (all labs ordered are listed, but only abnormal results are displayed) Labs Reviewed - No data to display   PROCEDURES:  Critical Care performed:   Procedures   MEDICATIONS ORDERED IN ED: Medications - No data to  display   IMPRESSION / MDM / [place] / ED COURSE  I reviewed the triage vital signs and the nursing notes.   Differential diagnosis includes, but is not limited to, otitis externa, otitis media, allergic rhinitis, contact skin reaction.  56 year old female presents to the ED with complaint of initial bilateral ears itching however this began bothering her more in her right ear and she has been using her fingers to scratch on the inside of her ears.  On exam there is no skin lesions externally.  There is some mild irritation to the canal but TMs bilaterally are without infection.  Patient was made aware.  A prescription for Cortisporin otic suspension was sent to the pharmacy and she is aware that this has some hydrocortisone which should help with the itching.  She is to follow-up with her PCP if any continued problems.      Patient's presentation is most consistent with acute, uncomplicated illness.  FINAL CLINICAL IMPRESSION(S) / ED DIAGNOSES   Final diagnoses:  Otalgia of right ear  Rx / DC Orders   ED Discharge Orders          Ordered    neomycin-polymyxin-hydrocortisone (CORTISPORIN) OTIC solution  4 times daily        04/08/22 0929             Note:  This document was prepared using Dragon voice recognition software and may include unintentional dictation errors.   Johnn Hai, PA-C 04/08/22 1256    Rada Hay, MD 04/08/22 1340

## 2022-04-08 NOTE — ED Triage Notes (Signed)
Pt reports started with itching to her right ear and now her ear is painful and the pain shoots up the side of her head. Denies fever, congestion or other symptoms.

## 2022-04-16 ENCOUNTER — Ambulatory Visit (INDEPENDENT_AMBULATORY_CARE_PROVIDER_SITE_OTHER): Payer: BC Managed Care – PPO | Admitting: Family Medicine

## 2022-04-16 ENCOUNTER — Other Ambulatory Visit (HOSPITAL_COMMUNITY)
Admission: RE | Admit: 2022-04-16 | Discharge: 2022-04-16 | Disposition: A | Discharge status: Home / Self Care | Payer: BC Managed Care – PPO | Source: Ambulatory Visit | Attending: Family Medicine | Admitting: Family Medicine

## 2022-04-16 ENCOUNTER — Encounter: Payer: Self-pay | Admitting: Family Medicine

## 2022-04-16 VITALS — BP 128/79 | HR 68 | Ht 64.0 in | Wt 233.2 lb

## 2022-04-16 DIAGNOSIS — Z124 Encounter for screening for malignant neoplasm of cervix: Secondary | ICD-10-CM | POA: Diagnosis present

## 2022-04-16 DIAGNOSIS — N761 Subacute and chronic vaginitis: Secondary | ICD-10-CM | POA: Insufficient documentation

## 2022-04-16 DIAGNOSIS — N950 Postmenopausal bleeding: Secondary | ICD-10-CM | POA: Insufficient documentation

## 2022-04-16 DIAGNOSIS — L900 Lichen sclerosus et atrophicus: Secondary | ICD-10-CM

## 2022-04-16 DIAGNOSIS — N939 Abnormal uterine and vaginal bleeding, unspecified: Secondary | ICD-10-CM | POA: Diagnosis present

## 2022-04-16 DIAGNOSIS — R300 Dysuria: Secondary | ICD-10-CM

## 2022-04-16 LAB — POCT URINALYSIS DIPSTICK
Appearance: NORMAL
Bilirubin, UA: NEGATIVE
Blood, UA: NEGATIVE
Glucose, UA: NEGATIVE
Leukocytes, UA: NEGATIVE
Nitrite, UA: NEGATIVE
Protein, UA: NEGATIVE
Spec Grav, UA: 1.01 (ref 1.010–1.025)
Urobilinogen, UA: 0.2 E.U./dL
pH, UA: 6 (ref 5.0–8.0)

## 2022-04-16 MED ORDER — MISOPROSTOL 200 MCG PO TABS: ORAL_TABLET | ORAL | 0 refills | Status: DC

## 2022-04-16 MED ORDER — CLOBETASOL PROPIONATE 0.05 % EX OINT: TOPICAL_OINTMENT | CUTANEOUS | 5 refills | Status: DC

## 2022-04-16 NOTE — Progress Notes (Signed)
GYNECOLOGY PROBLEM  VISIT ENCOUNTER NOTE  Subjective:   Victoria Williamson is a 56 y.o. [phone] female here for a problem GYN visit.  Current complaints: bleeding for 6 months. Patient had ablation when she was in her mid 85s.  Reports she is on eliquis for afib.  Reports also several months of vulvar itching. She has tried OTC fungal creams without relief.  Denies discharge, pelvic pain, problems with intercourse or other gynecologic concerns.    Gynecologic History No LMP recorded. Patient is postmenopausal.  Contraception: post menopausal status  Health Maintenance Due  Topic Date Due   HIV Screening  Never done   Hepatitis C Screening  Never done   Zoster Vaccines- Shingrix (1 of 2) Never done   COVID-19 Vaccine (3 - Pfizer risk series) 04/30/2020   INFLUENZA VACCINE  03/06/2022    The following portions of the patient's history were reviewed and updated as appropriate: allergies, current medications, past family history, past medical history, past social history, past surgical history and problem list.  Review of Systems Pertinent items are noted in HPI.   Objective:  BP 128/79   Pulse 68   Ht '5\' 4"'$  (1.626 m)   Wt 233 lb 3.2 oz (105.8 kg)   BMI 40.03 kg/m  Gen: well appearing, NAD HEENT: no scleral icterus CV: RR Lung: Normal WOB Ext: warm well perfused  PELVIC: Normal appearing external genitalia with white patches on labia, hair pattern is normal; Atrophic appearing vaginal mucosa and cervix.  No abnormal discharge noted.  Pap smear obtained.  Normal uterine size, no other palpable masses, no uterine or adnexal tenderness.  ENDOMETRIAL BIOPSY     The indications for endometrial biopsy were reviewed.   Risks of the biopsy including cramping, bleeding, infection, uterine perforation, inadequate specimen and need for additional procedures  were discussed. The patient states she understands and agrees to undergo procedure today. Consent was signed. Time out was  performed. Urine HCG was negative. During the pelvic exam, the cervix was prepped with Betadine. A single-toothed tenaculum was placed on the anterior lip of the cervix to stabilize it.  Attempted x 2 to pass 3 mm pipelle. Was unable to do so. Used dilators and unable able to get through internal os. Attempted with uterine sound and unable to pass into uterus. Unable to enter into the uterus. Terminated procedure due to inability to safely enter uterine cavity.  Assessment and Plan:   1. Postmenopausal bleeding Unable to complete EMB today, internal os is stenotic Reviewed pretreatment with cytotec Discussed ddx for PMB Recommend appt with GYn for second attempt at EMB and if unsuccessful they can schedule for sampling in OR. - US PELVIC COMPLETE WITH TRANSVAGINAL; Future - misoprostol (CYTOTEC) 200 MCG tablet; Take 2 pills by mouth and place two tablets in the vagina the night prior to your next clinic appointment  Dispense: 4 tablet; Refill: 0  2. Abnormal vaginal bleeding - Follicle stimulating hormone - Cytology - PAP  3. Chronic vaginitis Appears to have lichen sclerosis. - Follicle stimulating hormone - Cytology - PAP  4. Cervical cancer screening - Cytology - PAP  5. Dysuria - POCT Urinalysis Dipstick  6. Lichen sclerosus - most likely cause of itching, Discussed use of steroid and possible bx if not responsive.  -Clobetasol ointment (TEMOVATE) 0.05 %; Apply to affected area every night for 4 weeks, then every other day for 4 weeks and then twice a week for 4 weeks or until resolution.  Dispense: 30  g; Refill: 5   Please refer to After Visit Summary for other counseling recommendations.   Return in about 4 weeks (around 05/14/2022) for GYN for Endometrial bx (second attempt), on Eliquis.  Future Appointments  Date Time Provider [place]  05/15/2022  9:00 AM Kate Sable, MD [facility] None  05/21/2022  8:30 AM [facility]     Caren Macadam, MD, MPH, ABFM Attending [place] for [facility]

## 2022-04-16 NOTE — Progress Notes (Signed)
Reports onset intermittent bleeding, sometimes red, sometimes brownish red. Reports endometrial ablation "in my 44s". No menses since then. Reports intermittent itching on outside of vagina. Hx endometriosis

## 2022-04-17 LAB — FOLLICLE STIMULATING HORMONE: FSH: 25.4 m[IU]/mL

## 2022-04-18 ENCOUNTER — Encounter: Payer: Self-pay | Admitting: Cardiology

## 2022-04-19 ENCOUNTER — Telehealth: Payer: Self-pay

## 2022-04-19 NOTE — Telephone Encounter (Signed)
Return call to pt regarding questions about upcoming appt for Endometrial Bx.  And medication management directions . Pt voiced understanding.

## 2022-04-23 ENCOUNTER — Encounter: Payer: Self-pay | Admitting: Family

## 2022-04-23 ENCOUNTER — Ambulatory Visit (INDEPENDENT_AMBULATORY_CARE_PROVIDER_SITE_OTHER): Payer: BC Managed Care – PPO | Admitting: Family

## 2022-04-23 VITALS — BP 130/70 | HR 60 | Temp 97.8°F | Ht 64.0 in | Wt 231.0 lb

## 2022-04-23 DIAGNOSIS — H109 Unspecified conjunctivitis: Secondary | ICD-10-CM | POA: Diagnosis not present

## 2022-04-23 LAB — CYTOLOGY - PAP
Comment: NEGATIVE
Diagnosis: NEGATIVE
High risk HPV: NEGATIVE

## 2022-04-23 MED ORDER — OLOPATADINE HCL 0.1 % OP SOLN: 1.0000 [drp] | Freq: Two times a day (BID) | OPHTHALMIC | 2 refills | Status: DC

## 2022-04-23 NOTE — Progress Notes (Signed)
Subjective:    Patient ID: Victoria Williamson, female    DOB: May 25, 1966, 56 y.o.   MRN: [id number]  CC: Victoria Williamson is a 56 y.o. female who presents today for an acute visit.    HPI: Complains of right eye redness medial side x 7 days,  worsening.   Watery discharge  from both eyes , R > L and sense of 'pressure'. No eye pain, photophobia.    She doesn't wear in contacts.   No FBS, purulent discharge, HA, vision changes, fever, cough, congestion, sneezing.  She hasnt tried any medication for this.   Works around [employer]   History of atrial fibrillation.  Compliant with Eliquis 5 mg twice daily, toprol. Denies cp.   HISTORY:  Past Medical History:  Diagnosis Date   Anxiety    Arthritis    Chest pain    normal coronary arteries by cardiac cath oct 29,2014   Colon polyp    Depression    Dysautonomia ([place]) 12/18/2013   Fatty liver    Heart disease    History of blood transfusion    Hypertension    PAF (paroxysmal atrial fibrillation) ([place])    Prediabetes    Skin cancer, basal cell    Thyroid nodule    Past Surgical History:  Procedure Laterality Date   ATRIAL FIBRILLATION ABLATION N/A 07/15/2020   Procedure: ATRIAL FIBRILLATION ABLATION;  Surgeon: Vickie Epley, MD;  Location: [facility];  Service: Cardiovascular;  Laterality: N/A;   btl     CARDIOVERSION N/A 05/11/2020   Procedure: CARDIOVERSION;  Surgeon: Kate Sable, MD;  Location: [facility];  Service: Cardiovascular;  Laterality: N/A;   CARPAL TUNNEL RELEASE Left 06/23/2020   Procedure: CARPAL TUNNEL RELEASE;  Surgeon: [name], MD;  Location: [facility];  Service: Orthopedics;  Laterality: Left;  IV REGIONAL FOREARM BLOCK   ENDOMETRIAL ABLATION     right carpal tunel     Family History  Problem Relation Age of Onset   Heart disease Mother    Diabetes Mother    COPD Mother    Hypertension Mother    Arthritis Mother    High Cholesterol Mother     Kidney disease Mother    Cancer Father 30       kidney cancer with removal   Heart attack Father    Alcohol abuse Father    Hearing loss Father    Heart disease Father    High Cholesterol Father    Hypertension Father    Heart attack Brother 42       heart attack that caused his death   Breast cancer Maternal Aunt 64   Breast cancer Maternal Grandmother 29   Depression Paternal Grandmother    Heart attack Paternal Grandfather    Heart disease Paternal Grandfather    Diabetes Other        family HX, unspecified    Allergies: Codeine, Meloxicam, and Paroxetine hcl Current Outpatient Medications on File Prior to Visit  Medication Sig Dispense Refill   albuterol (VENTOLIN HFA) 108 (90 Base) MCG/ACT inhaler Inhale 2 puffs into the lungs every 6 (six) hours as needed for wheezing or shortness of breath. 8 g 2   ARIPiprazole (ABILIFY) 10 MG tablet Take 0.5 tablets (5 mg total) by mouth daily. 45 tablet 1   clobetasol ointment (TEMOVATE) 0.05 % Apply to affected area every night for 4 weeks, then every other day for 4 weeks and then twice a  week for 4 weeks or until resolution. 30 g 5   ELIQUIS 5 MG TABS tablet TAKE 1 TABLET BY MOUTH TWICE A DAY 180 tablet 3   metoprolol succinate (TOPROL-XL) 50 MG 24 hr tablet Take 50 mg by mouth in the morning and at bedtime.     metoprolol tartrate (LOPRESSOR) 25 MG tablet TAKE 1 TABLET BY MOUTH 2 TIMES DAILY AS NEEDED. 180 tablet 0   mirtazapine (REMERON) 15 MG tablet TAKE 1 TABLET BY MOUTH EVERYDAY AT BEDTIME 90 tablet 1   misoprostol (CYTOTEC) 200 MCG tablet Take 2 pills by mouth and place two tablets in the vagina the night prior to your next clinic appointment 4 tablet 0   pantoprazole (PROTONIX) 40 MG tablet TAKE 1 TABLET BY MOUTH EVERY DAY 90 tablet 0   Cholecalciferol ([employer] VIT D 5000 HIGH-POTENCY PO) Take by mouth daily in the afternoon. (Patient not taking: Reported on 04/23/2022)     potassium chloride (KLOR-CON) 10 MEQ tablet Take 4 tablets (40  mEq total) by mouth daily. (Patient not taking: Reported on 04/16/2022) 360 tablet 3   torsemide (DEMADEX) 20 MG tablet Take 1 tablet (20 mg total) by mouth daily. 90 tablet 3   No current facility-administered medications on file prior to visit.    Social History   Tobacco Use   Smoking status: Former    Packs/day: 1.00    Years: 37.00    Total pack years: 37.00    Types: Cigarettes    Quit date: 12/29/2019    Years since quitting: 2.3   Smokeless tobacco: Never  Vaping Use   Vaping Use: Every day  Substance Use Topics   Alcohol use: Not Currently   Drug use: Never    Review of Systems  Constitutional:  Negative for chills and fever.  HENT:  Negative for congestion and sinus pressure.   Eyes:  Positive for discharge and redness. Negative for photophobia, pain and visual disturbance.  Respiratory:  Negative for cough.   Cardiovascular:  Negative for chest pain and palpitations.  Gastrointestinal:  Negative for nausea and vomiting.      Objective:    BP 130/70 (BP Location: Left Arm, Patient Position: Sitting, Cuff Size: Normal)   Pulse 60   Temp 97.8 F (36.6 C) (Oral)   Ht '5\' 4"'$  (1.626 m)   Wt 231 lb (104.8 kg)   SpO2 97%   BMI 39.65 kg/m    Physical Exam Vitals reviewed.  Constitutional:      Appearance: She is well-developed.  HENT:     Head: Normocephalic and atraumatic.     Right Ear: Hearing, tympanic membrane, ear canal and external ear normal. No decreased hearing noted. No drainage, swelling or tenderness. No middle ear effusion. No foreign body. Tympanic membrane is not erythematous or bulging.     Left Ear: Hearing, tympanic membrane, ear canal and external ear normal. No decreased hearing noted. No drainage, swelling or tenderness.  No middle ear effusion. No foreign body. Tympanic membrane is not erythematous or bulging.     Nose: No rhinorrhea.     Right Sinus: No maxillary sinus tenderness or frontal sinus tenderness.     Left Sinus: No maxillary  sinus tenderness or frontal sinus tenderness.     Mouth/Throat:     Pharynx: Uvula midline. No oropharyngeal exudate or posterior oropharyngeal erythema.     Tonsils: No tonsillar abscesses.  Eyes:     General: Lids are everted, no foreign bodies appreciated. No scleral icterus.  Right eye: No discharge.        Left eye: No discharge or hordeolum.     Extraocular Movements:     Right eye: Normal extraocular motion.     Conjunctiva/sclera:     Right eye: Right conjunctiva is injected. No exudate or hemorrhage.    Left eye: Left conjunctiva is not injected. No hemorrhage.    Pupils: Pupils are equal, round, and reactive to light.     Comments: No external eye lesions. Surrounding skin intact.  PERRLA.  Right eye:  injection of them medial conjunctiva  No white spots, opacity, or foreign body appreciated. No collection of blood or pus in the anterior chamber. No ciliary flush surrounding iris.   No photophobia or eye pain appreciated during exam.   Cardiovascular:     Rate and Rhythm: Regular rhythm.     Pulses: Normal pulses.     Heart sounds: Normal heart sounds.  Pulmonary:     Effort: Pulmonary effort is normal.     Breath sounds: Normal breath sounds. No wheezing, rhonchi or rales.  Lymphadenopathy:     Head:     Right side of head: No submental, submandibular, tonsillar, preauricular, posterior auricular or occipital adenopathy.     Left side of head: No submental, submandibular, tonsillar, preauricular, posterior auricular or occipital adenopathy.     Cervical: No cervical adenopathy.  Skin:    General: Skin is warm and dry.  Neurological:     Mental Status: She is alert.  Psychiatric:        Speech: Speech normal.        Behavior: Behavior normal.        Thought Content: Thought content normal.        Assessment & Plan:   Problem List Items Addressed This Visit       Other   Right conjunctivitis - Primary    No alarm features at this time.  Presentation  consistent with viral versus allergic conjunctivitis. Unsure what 'pressure' she may be describing. BP 130/70. Denies HA, vision changes, eye pain, photophobia.  Trial of Patanol.  Counseled her that if erythema, and pressure does not completely resolve, please let me know right away that would arrange ophthalmology consult      Relevant Medications   olopatadine (PATANOL) 0.1 % ophthalmic solution      I am having Victoria Williamson "Victoria Williamson" start on olopatadine. I am also having her maintain her Cholecalciferol ([employer]S VIT D 5000 HIGH-POTENCY PO), ARIPiprazole, torsemide, potassium chloride, albuterol, metoprolol succinate, metoprolol tartrate, mirtazapine, Eliquis, pantoprazole, misoprostol, and clobetasol ointment.   Meds ordered this encounter  Medications   olopatadine (PATANOL) 0.1 % ophthalmic solution    Sig: Place 1 drop into both eyes 2 (two) times daily.    Dispense:  5 mL    Refill:  2    Order Specific Question:   Supervising Provider    Answer:   Crecencio Mc [[id number]]    Return precautions given.   Risks, benefits, and alternatives of the medications and treatment plan prescribed today were discussed, and patient expressed understanding.   Education regarding symptom management and diagnosis given to patient on AVS.  Continue to follow with Michela Pitcher, NP for routine health maintenance.   Victoria Williamson and I agreed with plan.   Mable Paris, FNP

## 2022-04-23 NOTE — Assessment & Plan Note (Addendum)
No alarm features at this time.  Presentation consistent with viral versus allergic conjunctivitis. Unsure what 'pressure' she may be describing. BP 130/70. Denies HA, vision changes, eye pain, photophobia.  Trial of Patanol.  Counseled her that if erythema, and pressure does not completely resolve, please let me know right away that would arrange ophthalmology consult

## 2022-04-23 NOTE — Patient Instructions (Signed)
Start patanol eye drop for suspected viral versus allergic conjunctivitis.  Please let me know if symptoms do not completely resolve  Viral Conjunctivitis, Adult  Viral conjunctivitis is an inflammation of the conjunctiva. The conjunctiva is the clear membrane that covers the white part of the eye and the inner surface of the eyelid. The inflammation is caused by a viral infection. The blood vessels in the conjunctiva become large, causing the eye to become red or pink and often itchy and tearing. The inflammation usually starts in one eye and goes to the other in a day or two. Infections usually go away over 1-2 weeks. Viral conjunctivitis is contagious. This means it can be easily passed from one person to another. This condition is often called pink eye. What are the causes? This condition is caused by a virus. It can be spread by touching objects that have been contaminated with the virus, such as doorknobs or towels, and then touching your eye. It can also be passed through tiny droplets, such as from coughing or sneezing. What increases the risk? You are more likely to develop this condition if you have a cold or the flu, or are in close contact with a person who has pink eye. What are the signs or symptoms? Symptoms of this condition include: Redness in the eye. Tearing or watery eyes. Itchy and irritated eyes. Burning feeling in the eyes. Clear drainage from the eye. Swollen eyelids. A gritty feeling in the eye. Light sensitivity. This condition often occurs with other symptoms, such as nasal congestion, cough, and fever. How is this diagnosed? This condition is diagnosed with a medical history and physical exam. If you have discharge from your eye, the discharge may be tested for a virus or to rule out other causes of conjunctivitis. How is this treated? Viral conjunctivitis does not respond to medicines that kill bacteria (antibiotics). The condition most often goes away on its own  in 1-2 weeks. If treatment is needed, it is aimed at relieving your symptoms and preventing the spread of infection. This may be done with artificial tear drops, antihistamine drops, or other eye medicines. In rare cases, steroid eye drops or anti-herpes virus medicines may be prescribed. Follow these instructions at home: Medicines  Take or apply over-the-counter and prescription medicines only as told by your health care provider. Do not touch the edge of the eyelid with the eye-drop bottle or ointment tube when applying medicines to the affected eye. This will prevent the spread of the infection to the other eye or to other people. Eye care Avoid touching or rubbing your eyes. Apply a clean, cool, wet washcloth onto your eye for 10-20 minutes, 3-4 times per day, or as told by your health care provider. If you wear contact lenses, do notwear them until the inflammation is gone and your health care provider says it is safe to wear them again. Ask your health care provider how to disinfect or replace your contact lenses before using them again. Wear glasses until you can resume wearing contacts. Avoid wearing eye makeup until the inflammation is gone. Throw away any old eye cosmetics that may be contaminated. Gently wipe away any crusting from your eye with a wet washcloth or a cotton ball. General instructions Change or wash your pillowcase every day or as told by your health care provider. Do not share towels, pillowcases, washcloths, eye makeup, makeup brushes, eye drops, contact lenses, or eyeglasses. This may spread the infection. Wash your hands often with  soap and water. Use paper towels to dry your hands. If soap and water are not available, use hand sanitizer. Avoid contact with other people until your eye is no longer red and tearing, or as told by your health care provider. Keep all follow-up visits. Contact a health care provider if: Your symptoms do not improve with treatment, or they  get worse. You have increased pain. Your vision becomes blurry. You have a fever. You have facial pain, redness, or swelling. You have yellow or green drainage coming from your eye. You have new symptoms. Get help right away if: You develop severe pain. Your vision gets much worse. Summary Viral conjunctivitis is an inflammation of the conjunctiva. It usually goes away in 1-2 weeks. The condition is caused by a virus and is spread by touching contaminated objects or breathing in droplets from a cough or a sneeze. This condition is usually treated with medicines and cold compresses to relieve the symptoms. Because it is caused by a virus, it should not be treated with antibiotics. This condition is very contagious. To prevent infection, avoid close contact with others, wash your hands often, and do not share towels or washcloths. Contact a health care provider if your symptoms do not go away with treatment, or if you have blurry vision, facial swelling, or increased pain. This information is not intended to replace advice given to you by your health care provider. Make sure you discuss any questions you have with your health care provider. Document Revised: 08/30/2021 Document Reviewed: 08/30/2021 Elsevier Patient Education  [place].

## 2022-04-27 ENCOUNTER — Ambulatory Visit
Admission: RE | Admit: 2022-04-27 | Discharge: 2022-04-27 | Disposition: A | Discharge status: Home / Self Care | Payer: BC Managed Care – PPO | Source: Ambulatory Visit | Attending: Family Medicine | Admitting: Family Medicine

## 2022-04-27 DIAGNOSIS — N950 Postmenopausal bleeding: Secondary | ICD-10-CM

## 2022-05-04 ENCOUNTER — Telehealth: Payer: Self-pay

## 2022-05-04 NOTE — Telephone Encounter (Signed)
TC to patient regarding recent U/S results.  I also let patient know Dr.Newton will be giving her a call this afternoon to discuss results and recommendation   "the US showed a small mass in the uterine lining that might be causing her bleeding but since we were unable to complete the biopsy in the office we need to try to sample this area.  She has an appt in October to either try again in the office vs discussion about doing this in the OR. The specialist recommended doing the sampling in the OR.  I can also call in PM to follow up. Calling her with results is delayed because I was asking the specialist about their recommendations"  -Dr.Newton  Pt voiced understanding.

## 2022-05-06 DIAGNOSIS — I7121 Aneurysm of the ascending aorta, without rupture: Secondary | ICD-10-CM

## 2022-05-06 HISTORY — DX: Aneurysm of the ascending aorta, without rupture: I71.21

## 2022-05-10 ENCOUNTER — Encounter: Payer: Self-pay | Admitting: Family Medicine

## 2022-05-13 ENCOUNTER — Encounter: Payer: Self-pay | Admitting: Family Medicine

## 2022-05-15 ENCOUNTER — Ambulatory Visit: Payer: BC Managed Care – PPO | Attending: Cardiology | Admitting: Cardiology

## 2022-05-15 ENCOUNTER — Encounter: Payer: Self-pay | Admitting: Cardiology

## 2022-05-15 VITALS — BP 142/82 | HR 62 | Ht 64.0 in | Wt 231.8 lb

## 2022-05-15 DIAGNOSIS — I480 Paroxysmal atrial fibrillation: Secondary | ICD-10-CM

## 2022-05-15 DIAGNOSIS — I10 Essential (primary) hypertension: Secondary | ICD-10-CM

## 2022-05-15 DIAGNOSIS — Z6839 Body mass index (BMI) 39.0-39.9, adult: Secondary | ICD-10-CM | POA: Diagnosis not present

## 2022-05-15 DIAGNOSIS — R7303 Prediabetes: Secondary | ICD-10-CM | POA: Diagnosis not present

## 2022-05-15 MED ORDER — SEMAGLUTIDE(0.25 OR 0.5MG/DOS) 2 MG/3ML ~~LOC~~ SOPN: PEN_INJECTOR | SUBCUTANEOUS | 1 refills | Status: DC

## 2022-05-15 NOTE — Patient Instructions (Addendum)
Medication Instructions:   Start taking Ozempic:  Inject 0.25 MG into skin once a week, for 4 weeks.     Inject 0.50 MG into skin once a week, for 4 weeks. (Please call when you are 2 weeks into this dose).     Inject 1.0 MG into skin once a week, for 4 weeks.     Inject 2.0 MG into skin once a week, and continue at this dose.     *If you need a refill on your cardiac medications before your next appointment, please call your pharmacy*   Follow-Up: At [facility], you and your health needs are our priority.  As part of our continuing mission to provide you with exceptional heart care, we have created designated Provider Care Teams.  These Care Teams include your primary Cardiologist (physician) and Advanced Practice Providers (APPs -  Physician Assistants and Nurse Practitioners) who all work together to provide you with the care you need, when you need it.  We recommend signing up for the patient portal called "MyChart".  Sign up information is provided on this After Visit Summary.  MyChart is used to connect with patients for Virtual Visits (Telemedicine).  Patients are able to view lab/test results, encounter notes, upcoming appointments, etc.  Non-urgent messages can be sent to your provider as well.   To learn more about what you can do with MyChart, go to [email or web address].    Your next appointment:   3 month(s)  The format for your next appointment:   In Person  Provider:    ONLY WITH Kate Sable, MDOther Instructions   Important Information About Sugar

## 2022-05-15 NOTE — Progress Notes (Signed)
Cardiology Office Note:    Date:  05/15/2022   ID:  Victoria Williamson, DOB 08-30-1965, MRN [id number]  PCP:  Michela Pitcher, NP  [facility] Cardiologist:  Kate Sable, MD  [facility] Electrophysiologist:  Vickie Epley, MD   Referring MD: Michela Pitcher, NP   Chief Complaint  Patient presents with   Follow-up    6 month follow up,      History of Present Illness:    Victoria Williamson is a 56 y.o. female with a hx of hypertension, paroxysmal atrial fibrillation s/p PVI 07/2020, SVT who presents for follow-up.   Been doing okay since ablation, follows up with EP, Metroprolol recently titrated due to paroxysmal SVTs.  Tolerating Eliquis with no bleeding issues, also takes Toprol-XL 75 mg twice daily as prescribed.  Edema is adequately controlled on as needed torsemide.  Edema usually gets worse as the day progresses.  She has been trying to eat healthy and lose weight.  Recently told she is prediabetic.  Denies any palpitations, dizziness.   Prior notes  Patient was seen in the emergency room 2 months ago due to palpitations and weakness.  EKG on 03/17/2020 while in the ED showed SVT, heart rate 147.    Review of EMR showed patient had a PET myocardial perfusion stress test 01/2020 with no evidence of ischemia.   Echocardiogram on 12/2019 showed normal systolic function, normal wall thickness, EF 33%, grade 2 diastolic dysfunction.   Had a left heart cath in 2014 and was told she had normal coronary arteries.  Patient states having symptoms of weakness/fatigue, shortness of breath whenever she goes into atrial fibrillation.  Initially diagnosed with atrial fibrillation in February 2021.    Past Medical History:  Diagnosis Date   Anxiety    Arthritis    Chest pain    normal coronary arteries by cardiac cath oct 29,2014   Colon polyp    Depression    Dysautonomia ([place]) 12/18/2013   Fatty liver    Heart disease    History of blood transfusion     Hypertension    PAF (paroxysmal atrial fibrillation) ([place])    Prediabetes    Skin cancer, basal cell    Thyroid nodule     Past Surgical History:  Procedure Laterality Date   ATRIAL FIBRILLATION ABLATION N/A 07/15/2020   Procedure: ATRIAL FIBRILLATION ABLATION;  Surgeon: Vickie Epley, MD;  Location: [facility];  Service: Cardiovascular;  Laterality: N/A;   btl     CARDIOVERSION N/A 05/11/2020   Procedure: CARDIOVERSION;  Surgeon: Kate Sable, MD;  Location: [facility];  Service: Cardiovascular;  Laterality: N/A;   CARPAL TUNNEL RELEASE Left 06/23/2020   Procedure: CARPAL TUNNEL RELEASE;  Surgeon: [name], MD;  Location: [facility];  Service: Orthopedics;  Laterality: Left;  IV REGIONAL FOREARM BLOCK   ENDOMETRIAL ABLATION     right carpal tunel      Current Medications: Current Meds  Medication Sig   albuterol (VENTOLIN HFA) 108 (90 Base) MCG/ACT inhaler Inhale 2 puffs into the lungs every 6 (six) hours as needed for wheezing or shortness of breath.   ARIPiprazole (ABILIFY) 10 MG tablet Take 0.5 tablets (5 mg total) by mouth daily.   clobetasol ointment (TEMOVATE) 0.05 % Apply to affected area every night for 4 weeks, then every other day for 4 weeks and then twice a week for 4 weeks or until resolution.   ELIQUIS 5 MG TABS tablet TAKE  1 TABLET BY MOUTH TWICE A DAY   metoprolol succinate (TOPROL-XL) 50 MG 24 hr tablet Take 75 mg by mouth in the morning and at bedtime.   metoprolol tartrate (LOPRESSOR) 25 MG tablet TAKE 1 TABLET BY MOUTH 2 TIMES DAILY AS NEEDED.   mirtazapine (REMERON) 15 MG tablet TAKE 1 TABLET BY MOUTH EVERYDAY AT BEDTIME   olopatadine (PATANOL) 0.1 % ophthalmic solution Place 1 drop into both eyes 2 (two) times daily.   pantoprazole (PROTONIX) 40 MG tablet TAKE 1 TABLET BY MOUTH EVERY DAY   Semaglutide,0.25 or 0.'5MG'$ /DOS, 2 MG/3ML SOPN Inject 0.25 MG into skin once a week for 4 weeks. Then Inject 0.50 MG into skin once a week for  4 weeks. (Please call when you are 2 weeks into this dose).   torsemide (DEMADEX) 20 MG tablet Take 1 tablet (20 mg total) by mouth daily. (Patient taking differently: Take 20 mg by mouth as needed.)   traMADol (ULTRAM) 50 MG tablet Take 50 mg by mouth daily as needed.     Allergies:   Codeine, Meloxicam, and Paroxetine hcl   Social History   Socioeconomic History   Marital status: Married    Spouse name: Not on file   Number of children: 2   Years of education: Not on file   Highest education level: Not on file  Occupational History   Not on file  Tobacco Use   Smoking status: Some Days    Packs/day: 1.00    Years: 37.00    Total pack years: 37.00    Types: E-cigarettes, Cigarettes    Last attempt to quit: 12/29/2019    Years since quitting: 2.3   Smokeless tobacco: Never  Vaping Use   Vaping Use: Every day  Substance and Sexual Activity   Alcohol use: Not Currently   Drug use: Never   Sexual activity: Yes    Partners: Male  Other Topics Concern   Not on file  Social History Narrative   Lives at home with husband      Fulltime: Quarry manager   Social Determinants of Health   Financial Resource Strain: Not on file  Food Insecurity: Not on file  Transportation Needs: Not on file  Physical Activity: Not on file  Stress: Not on file  Social Connections: Not on file     Family History: The patient's family history includes Alcohol abuse in her father; Arthritis in her mother; Breast cancer (age of onset: 80) in her maternal aunt; Breast cancer (age of onset: 39) in her maternal grandmother; COPD in her mother; Cancer (age of onset: 37) in her father; Depression in her paternal grandmother; Diabetes in her mother and another family member; Hearing loss in her father; Heart attack in her father and paternal grandfather; Heart attack (age of onset: 28) in her brother; Heart disease in her father, mother, and paternal grandfather; High Cholesterol in her father and mother;  Hypertension in her father and mother; Kidney disease in her mother.  ROS:   Please see the history of present illness.     All other systems reviewed and are negative.  EKGs/Labs/Other Studies Reviewed:    The following studies were reviewed today:   EKG:  EKG is  ordered today.  The ekg ordered today demonstrates normal sinus rhythm, heart rate 62  Recent Labs: 07/25/2021: B Natriuretic Peptide 25.8; Magnesium 1.9 10/20/2021: ALT 42; BUN 13; Creat 1.09; Hemoglobin 13.2; Platelets 218; Potassium 3.3; Sodium 142; TSH 1.98  Recent Lipid Panel  Component Value Date/Time   CHOL 168 10/20/2021 1529   TRIG 223 (H) 10/20/2021 1529   HDL 40 (L) 10/20/2021 1529   CHOLHDL 4.2 10/20/2021 1529   LDLCALC 97 10/20/2021 1529    Physical Exam:    VS:  BP (!) 142/82   Pulse 62   Ht '5\' 4"'$  (1.626 m)   Wt 231 lb 12.8 oz (105.1 kg)   SpO2 97%   BMI 39.79 kg/m     Wt Readings from Last 3 Encounters:  05/15/22 231 lb 12.8 oz (105.1 kg)  04/23/22 231 lb (104.8 kg)  04/16/22 233 lb 3.2 oz (105.8 kg)     GEN:  Well nourished, well developed in no acute distress HEENT: Normal NECK: No JVD; No carotid bruits CARDIAC: Regular rate and rhythm, no murmurs RESPIRATORY:  Clear to auscultation without rales, wheezing or rhonchi  ABDOMEN: Soft, non-tender, non-distended MUSCULOSKELETAL:  No edema; No deformity  SKIN: Warm and dry NEUROLOGIC:  Alert and oriented x 3 PSYCHIATRIC:  Normal affect   ASSESSMENT:    1. PAF (paroxysmal atrial fibrillation) ([place])   2. Primary hypertension   3. BMI 39.0-39.9,adult   4. Pre-diabetes     PLAN:    In order of problems listed above:  paroxysmal atrial fibrillation, status post PVI 07/2020 currently in sinus rhythm.  CHA2DS2-VASc of 2. continue Toprol-XL 75 mg twice daily, Eliquis 5 mg twice daily.  Take torsemide 20 mg daily as needed for edema. hypertension, BP elevated, low-salt diet, weight loss advised.  If stays elevated at follow-up visit,  plan to add losartan.  Continue Toprol-XL. Obesity, low-calorie diet, weight loss advised.  Patient is prediabetic, start Ozempic. Prediabetic, Ozempic.  Follow-up in 3 months  Total encounter time more than 35 minutes  Greater than 50% was spent in counseling and coordination of care with the patient    Medication Adjustments/Labs and Tests Ordered: Current medicines are reviewed at length with the patient today.  Concerns regarding medicines are outlined above.  Orders Placed This Encounter  Procedures   EKG 12-Lead    Meds ordered this encounter  Medications   Semaglutide,0.25 or 0.'5MG'$ /DOS, 2 MG/3ML SOPN    Sig: Inject 0.25 MG into skin once a week for 4 weeks. Then Inject 0.50 MG into skin once a week for 4 weeks. (Please call when you are 2 weeks into this dose).    Dispense:  3 mL    Refill:  1     Patient Instructions  Medication Instructions:   Start taking Ozempic:  Inject 0.25 MG into skin once a week, for 4 weeks.     Inject 0.50 MG into skin once a week, for 4 weeks. (Please call when you are 2 weeks into this dose).     Inject 1.0 MG into skin once a week, for 4 weeks.     Inject 2.0 MG into skin once a week, and continue at this dose.     *If you need a refill on your cardiac medications before your next appointment, please call your pharmacy*   Follow-Up: At [facility], you and your health needs are our priority.  As part of our continuing mission to provide you with exceptional heart care, we have created designated Provider Care Teams.  These Care Teams include your primary Cardiologist (physician) and Advanced Practice Providers (APPs -  Physician Assistants and Nurse Practitioners) who all work together to provide you with the care you need, when you need it.  We recommend  signing up for the patient portal called "MyChart".  Sign up information is provided on this After Visit Summary.  MyChart is used to connect with patients for  Virtual Visits (Telemedicine).  Patients are able to view lab/test results, encounter notes, upcoming appointments, etc.  Non-urgent messages can be sent to your provider as well.   To learn more about what you can do with MyChart, go to [email or web address].    Your next appointment:   3 month(s)  The format for your next appointment:   In Person  Provider:    ONLY WITH Kate Sable, MDOther Instructions   Important Information About Sugar         Signed, Kate Sable, MD  05/15/2022 9:43 AM    [place]

## 2022-05-17 ENCOUNTER — Telehealth: Payer: Self-pay

## 2022-05-17 NOTE — Telephone Encounter (Signed)
Prior Authorization initiated in [email or web address] for Ozempic.  KEY: [id number]  Response: Your information has been submitted to [place]. [employer] will review the request and notify you of the determination decision directly, typically within 72 hours of receiving all information.  You will also receive your request decision electronically. To check for an update later, open this request again from your dashboard.  If [employer] has not responded within the specified timeframe or if you have any questions about your PA submission, contact [place] directly at [phone].

## 2022-05-18 ENCOUNTER — Ambulatory Visit: Payer: BC Managed Care – PPO | Admitting: Nurse Practitioner

## 2022-05-18 ENCOUNTER — Encounter: Payer: Self-pay | Admitting: Nurse Practitioner

## 2022-05-18 VITALS — BP 132/80 | HR 64 | Temp 97.0°F | Resp 12 | Ht 64.0 in | Wt 233.0 lb

## 2022-05-18 DIAGNOSIS — R0609 Other forms of dyspnea: Secondary | ICD-10-CM | POA: Diagnosis not present

## 2022-05-18 DIAGNOSIS — I10 Essential (primary) hypertension: Secondary | ICD-10-CM

## 2022-05-18 DIAGNOSIS — R5383 Other fatigue: Secondary | ICD-10-CM | POA: Diagnosis not present

## 2022-05-18 DIAGNOSIS — R0602 Shortness of breath: Secondary | ICD-10-CM | POA: Insufficient documentation

## 2022-05-18 DIAGNOSIS — R7303 Prediabetes: Secondary | ICD-10-CM | POA: Diagnosis not present

## 2022-05-18 DIAGNOSIS — J439 Emphysema, unspecified: Secondary | ICD-10-CM

## 2022-05-18 DIAGNOSIS — R1319 Other dysphagia: Secondary | ICD-10-CM | POA: Insufficient documentation

## 2022-05-18 LAB — BASIC METABOLIC PANEL
BUN: 10 mg/dL (ref 6–23)
CO2: 29 mEq/L (ref 19–32)
Calcium: 9.5 mg/dL (ref 8.4–10.5)
Chloride: 105 mEq/L (ref 96–112)
Creatinine, Ser: 0.89 mg/dL (ref 0.40–1.20)
GFR: 73.11 mL/min (ref >60.00)
Glucose, Bld: 70 mg/dL (ref 70–99)
Potassium: 4.1 mEq/L (ref 3.5–5.1)
Sodium: 141 mEq/L (ref 135–145)

## 2022-05-18 LAB — POCT GLYCOSYLATED HEMOGLOBIN (HGB A1C): Hemoglobin A1C: 5.4 % (ref 4.0–5.6)

## 2022-05-18 LAB — TSH: TSH: 3.26 u[IU]/mL (ref 0.35–5.50)

## 2022-05-18 LAB — VITAMIN B12: Vitamin B-12: 333 pg/mL (ref 211–911)

## 2022-05-18 MED ORDER — FLUTICASONE-SALMETEROL 250-50 MCG/ACT IN AEPB: 1.0000 | INHALATION_SPRAY | Freq: Two times a day (BID) | RESPIRATORY_TRACT | 1 refills | Status: DC

## 2022-05-18 NOTE — Progress Notes (Signed)
Established Patient Office Visit  Subjective   Patient ID: Victoria Williamson, female    DOB: 1965/11/15  Age: 56 y.o. MRN: [id number]  Chief Complaint  Patient presents with   Follow-up    Pre diabetes   Shortness of Breath    Getting worse, trying to quit vaping   Feels like things are getting stuck in her throat    With solid and liquid, wonders if she is having thyroid issues.      Prediabetes: States that she started drinking more water. She is doing 3 sodas a day, pepsi bottles. States that she is eating more veggies. Not doing the best with fruit.   Thyroid/swallowing diff: States that she noticed approx 2-3 months. States that it feels tight when it goes down. States no choking. Feels like it gets stuck for a little bit. States that it is every time. States that she feel tired all time.  Also wonder if this could be her thyroid because of the difficulty swallowing and the fatigued feeling.  Patient of note has had thyroid ultrasound in 2022 done by ENT.  SHOB: States that it is getting worse. States that at work she has to walk from her building to her car and states that sometime she has to stop because of the breathing and her back . States that she will use it with distance of walking.  AUB: was seen by gyn and did an [place]. Has consulation on 05/29/2022 for biopsy/possible procedure, hysterectomy?  Sleep: goes to bed around 8 or 7-730. Wakes up every 2 hours because of hip/back pain. Gets up around 4-430. Does not feel rested. Does snore.    Review of Systems  Constitutional:  Negative for chills and fever.  Respiratory:  Positive for shortness of breath.   Cardiovascular:  Negative for chest pain.  Neurological:  Positive for headaches (tylenol has helped).      Objective:     BP 132/80   Pulse 64   Temp (!) 97 F (36.1 C)   Resp 12   Ht '5\' 4"'$  (1.626 m)   Wt 233 lb (105.7 kg)   SpO2 97%   BMI 39.99 kg/m  BP Readings from Last 3 Encounters:  05/18/22  132/80  05/15/22 (!) 142/82  04/23/22 130/70   Wt Readings from Last 3 Encounters:  05/18/22 233 lb (105.7 kg)  05/15/22 231 lb 12.8 oz (105.1 kg)  04/23/22 231 lb (104.8 kg)      Physical Exam Vitals and nursing note reviewed.  Constitutional:      Appearance: Normal appearance. She is well-developed.  Neck:     Thyroid: No thyroid mass or thyromegaly.  Cardiovascular:     Rate and Rhythm: Normal rate and regular rhythm.     Heart sounds: Normal heart sounds.  Pulmonary:     Effort: Pulmonary effort is normal.     Comments: Diminished in left lower lobe Musculoskeletal:     Right lower leg: Edema (trace) present.     Left lower leg: Edema (trace) present.  Lymphadenopathy:     Cervical: No cervical adenopathy.  Neurological:     Mental Status: She is alert.      Results for orders placed or performed in visit on 05/18/22  POCT glycosylated hemoglobin (Hb A1C)  Result Value Ref Range   Hemoglobin A1C 5.4 4.0 - 5.6 %   HbA1c POC (<> result, manual entry)     HbA1c, POC (prediabetic range)     HbA1c,  POC (controlled diabetic range)        The 10-year ASCVD risk score (Arnett DK, et al., 2019) is: 7.7%    Assessment & Plan:   Problem List Items Addressed This Visit       Cardiovascular and Mediastinum   Essential hypertension    Blood pressure was slightly elevated at beginning of office visit.  Upon recheck within normal limits.  Patient is followed by Dr. Andres Labrum.  He has started placed order for to start Ozempic to help with weight loss but has not started yet.        Respiratory   Pulmonary emphysema ([place])    Incidental finding on low-dose CT scan.  Patient is expressing more dyspnea on exertion I have using albuterol inhaler daily we will start patient on maintenance inhaler of albuterol.  Follow-up in 6 weeks sooner if needed      Relevant Medications   fluticasone-salmeterol (ADVAIR DISKUS) 250-50 MCG/ACT AEPB     Digestive   Esophageal  dysphagia    Patient having difficulty in the esophageal phase.  Does not matter whether it is liquid or solid.  States it happens every time patient denies choking.  Do not think is thyroid related that she had a negative Pemberton sign.  Ambulatory referral to GI in [facility] placed today.      Relevant Orders   Ambulatory referral to Gastroenterology     Other   Pre-diabetes - Primary    Last time A1c was checked was 5.7 which places patient patient in prediabetes range she has been working on lifestyle modifications.  Today's A1c was 5.4      Relevant Orders   POCT glycosylated hemoglobin (Hb A1C) (Completed)   Other fatigue    Ambiguous in nature and could be multifactorial.  We will check TSH and B12.  These are normal could be related to her broken sleep due to her orthopedic problems.  Also keep OSA as a possibility on the right arm      Relevant Orders   CBC   Basic metabolic panel   Vitamin F35   TSH   DOE (dyspnea on exertion)    States worse as of late.  Does have a history of smoking traditional cigarettes.  Last low-dose CT scan was approximately 1 year ago that showed emphysema.  Patient has appointment on 05/21/2022 for repeat low-dose CT scan.  We will start patient on Advair maintenance inhaler.  Continue albuterol as needed.  We will plan to fill out handicap placard to offer some relief with the dyspnea on exertion.      Relevant Medications   fluticasone-salmeterol (ADVAIR DISKUS) 250-50 MCG/ACT AEPB    Return in about 6 weeks (around 06/29/2022) for Inhaler/breathing recheck .    Romilda Garret, NP

## 2022-05-18 NOTE — Assessment & Plan Note (Signed)
Blood pressure was slightly elevated at beginning of office visit.  Upon recheck within normal limits.  Patient is followed by Dr. Andres Labrum.  He has started placed order for to start Ozempic to help with weight loss but has not started yet.

## 2022-05-18 NOTE — Assessment & Plan Note (Signed)
Last time A1c was checked was 5.7 which places patient patient in prediabetes range she has been working on lifestyle modifications.  Today's A1c was 5.4

## 2022-05-18 NOTE — Patient Instructions (Signed)
Nice to see you today I will be in touch with the labs once I have the results I want to see you in 6 weeks, sooner if you need me  I have referred you to GI for the swallowing

## 2022-05-18 NOTE — Assessment & Plan Note (Signed)
Patient having difficulty in the esophageal phase.  Does not matter whether it is liquid or solid.  States it happens every time patient denies choking.  Do not think is thyroid related that she had a negative Pemberton sign.  Ambulatory referral to GI in [facility] placed today.

## 2022-05-18 NOTE — Assessment & Plan Note (Signed)
States worse as of late.  Does have a history of smoking traditional cigarettes.  Last low-dose CT scan was approximately 1 year ago that showed emphysema.  Patient has appointment on 05/21/2022 for repeat low-dose CT scan.  We will start patient on Advair maintenance inhaler.  Continue albuterol as needed.  We will plan to fill out handicap placard to offer some relief with the dyspnea on exertion.

## 2022-05-18 NOTE — Telephone Encounter (Signed)
Per fax received from [facility]:  The request for Ozempic is denied. This medication is approved for members with Type 2 Diabetes. In this case, the member is not being treated for this condition. Utilization management guidelines used: [employer] [place] GLP-1-Agonists -  Standard Utilization Management Criteria

## 2022-05-18 NOTE — Assessment & Plan Note (Signed)
Ambiguous in nature and could be multifactorial.  We will check TSH and B12.  These are normal could be related to her broken sleep due to her orthopedic problems.  Also keep OSA as a possibility on the right arm

## 2022-05-18 NOTE — Telephone Encounter (Signed)
Sent patient a MyChart message with the denial for Ozempic. Encouraged her to reach out if she had any questions or concerns.

## 2022-05-18 NOTE — Assessment & Plan Note (Signed)
Incidental finding on low-dose CT scan.  Patient is expressing more dyspnea on exertion I have using albuterol inhaler daily we will start patient on maintenance inhaler of albuterol.  Follow-up in 6 weeks sooner if needed

## 2022-05-21 ENCOUNTER — Telehealth: Payer: Self-pay | Admitting: Cardiology

## 2022-05-21 ENCOUNTER — Ambulatory Visit
Admission: RE | Admit: 2022-05-21 | Discharge: 2022-05-21 | Disposition: A | Discharge status: Home / Self Care | Payer: BC Managed Care – PPO | Source: Ambulatory Visit | Attending: Internal Medicine | Admitting: Internal Medicine

## 2022-05-21 DIAGNOSIS — F17210 Nicotine dependence, cigarettes, uncomplicated: Secondary | ICD-10-CM | POA: Insufficient documentation

## 2022-05-21 LAB — CBC
HCT: 43.4 % (ref 36.0–46.0)
Hemoglobin: 14.1 g/dL (ref 12.0–15.0)
MCHC: 32.5 g/dL (ref 30.0–36.0)
MCV: 94.6 fl (ref 78.0–100.0)
Platelets: 208 10*3/uL (ref 150.0–400.0)
RBC: 4.59 Mil/uL (ref 3.87–5.11)
RDW: 12.6 % (ref 11.5–15.5)
WBC: 8.7 10*3/uL (ref 4.0–10.5)

## 2022-05-21 NOTE — Telephone Encounter (Signed)
Pt c/o medication issue:  1. Name of Medication:    2. How are you currently taking this medication (dosage and times per day)?    3. Are you having a reaction (difficulty breathing--STAT)? No   4. What is your medication issue? Patient think she needs additional bp pill that her and the dr discuss. She states her bp is still running high. This morning 152/92.  Please advise   \

## 2022-05-22 ENCOUNTER — Encounter: Payer: Self-pay | Admitting: Nurse Practitioner

## 2022-05-22 ENCOUNTER — Other Ambulatory Visit: Payer: Self-pay | Admitting: Nurse Practitioner

## 2022-05-22 DIAGNOSIS — G9589 Other specified diseases of spinal cord: Secondary | ICD-10-CM

## 2022-05-22 DIAGNOSIS — Z1231 Encounter for screening mammogram for malignant neoplasm of breast: Secondary | ICD-10-CM

## 2022-05-22 NOTE — Telephone Encounter (Signed)
Called and left a detailed VM for patient per DPR on file. Informed her that I see in Dr. Thereasa Solo last [place] note that he would add Losartan to her medications if BP was still running high, although he did not specify which dose. I informed her that I would reach back out to her after discussing this with him tomorrow.

## 2022-05-22 NOTE — Telephone Encounter (Signed)
Patient is returning call.  °

## 2022-05-22 NOTE — Telephone Encounter (Signed)
Are you able to advise or does patient need to wait on pulmonologist office to call her to discuss

## 2022-05-23 ENCOUNTER — Encounter: Payer: Self-pay | Admitting: Nurse Practitioner

## 2022-05-23 ENCOUNTER — Other Ambulatory Visit: Payer: Self-pay | Admitting: Cardiology

## 2022-05-23 ENCOUNTER — Telehealth: Payer: Self-pay | Admitting: Acute Care

## 2022-05-23 ENCOUNTER — Other Ambulatory Visit: Payer: Self-pay | Admitting: Acute Care

## 2022-05-23 DIAGNOSIS — Z87891 Personal history of nicotine dependence: Secondary | ICD-10-CM

## 2022-05-23 DIAGNOSIS — I7121 Aneurysm of the ascending aorta, without rupture: Secondary | ICD-10-CM

## 2022-05-23 MED ORDER — LOSARTAN POTASSIUM 25 MG PO TABS: 25.0000 mg | ORAL_TABLET | Freq: Every day | ORAL | 3 refills | Status: DC

## 2022-05-23 NOTE — Telephone Encounter (Signed)
Spoke with Dr. Garen Lah and he recommended that patient start Losartan 25 MG once a day. Called patient and informed her of this. Patient verbalized understanding and agreed with plan.

## 2022-05-23 NOTE — Telephone Encounter (Signed)
CT results faxed to PCP with follow up plans included. Order placed for 12 mth follow up lung screening CT.

## 2022-05-23 NOTE — Telephone Encounter (Signed)
I have called the patient with the results of her low dose CT Chest.  Her scan was read as a lung RADS 2.  Benign appearance and behavior annual screening is due again in 12 months.    There was an incidental finding of a well-defined right paraspinal mass that is unchanged when compared with prior exams dating back to December 2021.  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  The patient is followed by Dr. Newman Pies.  I have asked her to call his office to get a scheduled appointment with him so that he can make the determination of any further imaging he feels is appropriate and clinically indicated to further diagnose and follow this incidental finding.  Additionally there was notation of a mildly dilated ascending thoracic aorta currently measuring up to 4.0 cm.  Recommendation is for annual imaging follow-up CTA or MRA.  I discussed with the patient that she has elevated blood pressures.  She is on medication and if she is followed by cardiology however her blood pressures have been consistently 135 or greater.  She explained to me today that Dr. Ferdinand Lango E. Tang has just added losartan 25 mg to her current regiment for tighter control.  Because patient has issues with hypertension I am going to go ahead and refer her to thoracic surgery so that they can monitor this thoracic aneurysm.  Baker Janus and I discussed the above on the phone over period of about 15 minutes today.  She verbalized understanding of the above and agreed with the plan of care.  Denise please place order for 68-monthlow-dose CT scan and fax results to PCP. I have included Dr. JGorden Harmson this telephone message so that he will get the information regarding the incidental finding of a right paraspinal mass. I will place the referral for thoracic surgery to monitor the aneurysm.   Thanks so much

## 2022-05-29 ENCOUNTER — Ambulatory Visit (INDEPENDENT_AMBULATORY_CARE_PROVIDER_SITE_OTHER): Payer: BC Managed Care – PPO | Admitting: Obstetrics and Gynecology

## 2022-05-29 ENCOUNTER — Encounter: Payer: Self-pay | Admitting: Obstetrics and Gynecology

## 2022-05-29 ENCOUNTER — Other Ambulatory Visit (HOSPITAL_COMMUNITY)
Admission: RE | Admit: 2022-05-29 | Discharge: 2022-05-29 | Disposition: A | Discharge status: Home / Self Care | Payer: BC Managed Care – PPO | Source: Ambulatory Visit | Attending: Obstetrics and Gynecology | Admitting: Obstetrics and Gynecology

## 2022-05-29 ENCOUNTER — Encounter: Payer: Self-pay | Admitting: *Deleted

## 2022-05-29 VITALS — BP 139/81 | HR 72

## 2022-05-29 DIAGNOSIS — R222 Localized swelling, mass and lump, trunk: Secondary | ICD-10-CM | POA: Diagnosis not present

## 2022-05-29 DIAGNOSIS — N950 Postmenopausal bleeding: Secondary | ICD-10-CM | POA: Diagnosis present

## 2022-05-29 DIAGNOSIS — N898 Other specified noninflammatory disorders of vagina: Secondary | ICD-10-CM | POA: Diagnosis present

## 2022-05-29 DIAGNOSIS — N9089 Other specified noninflammatory disorders of vulva and perineum: Secondary | ICD-10-CM | POA: Diagnosis not present

## 2022-05-29 DIAGNOSIS — N882 Stricture and stenosis of cervix uteri: Secondary | ICD-10-CM | POA: Diagnosis not present

## 2022-05-29 DIAGNOSIS — I77810 Thoracic aortic ectasia: Secondary | ICD-10-CM | POA: Insufficient documentation

## 2022-05-29 DIAGNOSIS — N9489 Other specified conditions associated with female genital organs and menstrual cycle: Secondary | ICD-10-CM

## 2022-05-29 NOTE — Progress Notes (Signed)
Obstetrics and Gynecology New Patient Evaluation  Appointment Date: 05/29/2022  OBGYN Clinic: [facility]  Primary Care Provider: Michela Pitcher  Referring Provider: Lubertha Sayres, MD  Chief Complaint: Postmenopausal bleeding, abnormal pelvic u/s  History of Present Illness: Victoria Williamson is a 56 y.o. Caucasian [id number] (No LMP recorded. Patient is postmenopausal.), seen for the above chief complaint. Her past medical history is significant for emphysema, a fib currently on eliquis and h/o ablation, 2006 novasure ablation, h/o BTL, newly diagnosed ascending thoracic aorta dilation, stable paraspinal mass  Patient had a Novasure endometrial ablation in 2006 for heavy periods and did not have a period since then. She had hot flashes a few years ago that went away. Starting six months ago, she's had vaginal bleeding. It occurs about once or twice a week, lasts for a day or two, can be dark or bright red, no pain and she only needs a panty liner. She was seen by Dr. Ernestina Patches for evaluation. Pap smear negative and embx attempted but felt she couldn't get into the cavity due to stenosis so an u/s was ordered, which showed 3-4cm vascular polyp in the endometrial cavity.   Also, at her pelvic exam with Dr. Ernestina Patches, she noted what looked to be white patches on the labia and clobetasol ointment prescribed. She states she only used the ointment once and her s/s cleared up.   She states that she went through a period of hot flashes and night sweats a few years ago.   Review of Systems: Pertinent items noted in HPI and remainder of comprehensive ROS otherwise negative.   Patient Active Problem List   Diagnosis Date Noted   Cervical stenosis (uterine cervix) 05/29/2022   Vulvar irritation 05/29/2022   Endometrial mass 05/29/2022   Ascending aorta dilatation (HCC) 05/29/2022   Paraspinal mass 05/29/2022   Pre-diabetes 05/18/2022   Other fatigue 05/18/2022   DOE (dyspnea  on exertion) 05/18/2022   Pulmonary emphysema ([place]) 05/18/2022   Esophageal dysphagia 05/18/2022   Right conjunctivitis 04/23/2022   Hematuria 12/01/2021   Abnormal uterine bleeding (AUB) 12/01/2021   Former smoker 10/20/2021   Chronic bilateral low back pain without sciatica 05/15/2021   COVID-19 04/25/2021   Obesity (BMI 30-39.9) 03/17/2021   Bipolar depression ([place]) 03/17/2021   Preventative health care 03/17/2021   Vitamin D deficiency 03/17/2021   Skin lesion 03/17/2021   SVT (supraventricular tachycardia) 11/23/2020   Arthritis    Carpal tunnel syndrome of left wrist 02/04/2020   Chronic heart failure with preserved ejection fraction ([place]) 01/11/2020   Hepatic steatosis 12/18/2019   High serum thyroid stimulating hormone (TSH) 12/18/2019   Lower extremity edema 12/17/2019   Neck swelling 12/17/2019   Paroxysmal atrial fibrillation ([place]) 12/17/2019   Lumbar radiculitis 01/16/2019   Tubular adenoma 08/22/2018   B12 deficiency 01/14/2018   Essential hypertension 05/19/2014   Gastroesophageal reflux disease without esophagitis 05/19/2014   Vasovagal syndrome 12/25/2013   Anxiety and depression 12/20/2013   Dysautonomia ([place]) 12/18/2013    Past Medical History:  Past Medical History:  Diagnosis Date   Anxiety    Arthritis    Chest pain    normal coronary arteries by cardiac cath oct 29,2014   Colon polyp    Depression    Dysautonomia ([place]) 12/18/2013   Fatty liver    Heart disease    History of blood transfusion    Hypertension    PAF (paroxysmal atrial fibrillation) (HCC)    Prediabetes  Skin cancer, basal cell    Thyroid nodule     Past Surgical History:  Past Surgical History:  Procedure Laterality Date   ATRIAL FIBRILLATION ABLATION N/A 07/15/2020   Procedure: ATRIAL FIBRILLATION ABLATION;  Surgeon: Vickie Epley, MD;  Location: [facility];  Service: Cardiovascular;  Laterality: N/A;   CARDIOVERSION N/A 05/11/2020   Procedure:  CARDIOVERSION;  Surgeon: Kate Sable, MD;  Location: [facility];  Service: Cardiovascular;  Laterality: N/A;   CARPAL TUNNEL RELEASE Left 06/23/2020   Procedure: CARPAL TUNNEL RELEASE;  Surgeon: [name], MD;  Location: [facility];  Service: Orthopedics;  Laterality: Left;  IV REGIONAL FOREARM BLOCK   ENDOMETRIAL ABLATION  2006   hysteroscopy, novasure for menorrhagia   right carpal tunel     TUBAL LIGATION Bilateral     Past Obstetrical History:  OB History  Gravida Para Term Preterm AB Living  _0 SAB IAB Ectopic Multiple Live Births  1       2    # Outcome Date GA Lbr Len/2nd Weight Sex Delivery Anes PTL Lv  3 SAB           2 Term           1 Term             Obstetric Comments  Vaginal delivery x 2    Past Gynecological History: As per HPI. History of Pap Smear(s): Yes.   Last pap 04/2022, which was cytology and hpv negative History of HRT use: No.  Social History:  Social History   Socioeconomic History   Marital status: Married    Spouse name: Not on file   Number of children: 2   Years of education: Not on file   Highest education level: Not on file  Occupational History   Not on file  Tobacco Use   Smoking status: Some Days    Packs/day: 1.00    Years: 37.00    Total pack years: 37.00    Types: E-cigarettes, Cigarettes    Last attempt to quit: 12/29/2019    Years since quitting: 2.4   Smokeless tobacco: Never  Vaping Use   Vaping Use: Every day  Substance and Sexual Activity   Alcohol use: Not Currently   Drug use: Never   Sexual activity: Yes    Partners: Male  Other Topics Concern   Not on file  Social History Narrative   Lives at home with husband      Fulltime: Quarry manager   Social Determinants of Health   Financial Resource Strain: Not on file  Food Insecurity: Not on file  Transportation Needs: Not on file  Physical Activity: Not on file  Stress: Not on file  Social Connections: Not on file  Intimate  Partner Violence: Not on file    Family History:  Family History  Problem Relation Age of Onset   Heart disease Mother    Diabetes Mother    COPD Mother    Hypertension Mother    Arthritis Mother    High Cholesterol Mother    Kidney disease Mother    Cancer Father 67       kidney cancer with removal   Heart attack Father    Alcohol abuse Father    Hearing loss Father    Heart disease Father    High Cholesterol Father    Hypertension Father    Heart attack Brother 51  heart attack that caused his death   Breast cancer Maternal Aunt 64   Breast cancer Maternal Grandmother 92   Depression Paternal Grandmother    Heart attack Paternal Grandfather    Heart disease Paternal Grandfather    Diabetes Other        family HX, unspecified    Medications Aracelis G. Luna "Baker Janus" had no medications administered during this visit. Current Outpatient Medications  Medication Sig Dispense Refill   albuterol (VENTOLIN HFA) 108 (90 Base) MCG/ACT inhaler Inhale 2 puffs into the lungs every 6 (six) hours as needed for wheezing or shortness of breath. 8 g 2   ARIPiprazole (ABILIFY) 10 MG tablet Take 0.5 tablets (5 mg total) by mouth daily. 45 tablet 1   Cholecalciferol ([employer] VIT D 5000 HIGH-POTENCY PO) Take by mouth daily in the afternoon.     clobetasol ointment (TEMOVATE) 0.05 % Apply to affected area every night for 4 weeks, then every other day for 4 weeks and then twice a week for 4 weeks or until resolution. 30 g 5   ELIQUIS 5 MG TABS tablet TAKE 1 TABLET BY MOUTH TWICE A DAY 180 tablet 3   fluticasone-salmeterol (ADVAIR DISKUS) 250-50 MCG/ACT AEPB Inhale 1 puff into the lungs in the morning and at bedtime. 60 each 1   losartan (COZAAR) 25 MG tablet Take 1 tablet (25 mg total) by mouth daily. 30 tablet 3   metoprolol succinate (TOPROL-XL) 50 MG 24 hr tablet Take 1.5 tablets (75 mg total) by mouth 2 (two) times daily. 270 tablet 0   metoprolol tartrate (LOPRESSOR) 25 MG tablet TAKE 1  TABLET BY MOUTH 2 TIMES DAILY AS NEEDED. 180 tablet 0   mirtazapine (REMERON) 15 MG tablet TAKE 1 TABLET BY MOUTH EVERYDAY AT BEDTIME 90 tablet 1   pantoprazole (PROTONIX) 40 MG tablet TAKE 1 TABLET BY MOUTH EVERY DAY 90 tablet 0   prednisoLONE acetate (PRED FORTE) 1 % ophthalmic suspension 1 drop 4 (four) times daily.     traMADol (ULTRAM) 50 MG tablet Take 50 mg by mouth daily as needed.     potassium chloride (KLOR-CON) 10 MEQ tablet Take 4 tablets (40 mEq total) by mouth daily. 360 tablet 3   Semaglutide,0.25 or 0.5MG/DOS, 2 MG/3ML SOPN Inject 0.25 MG into skin once a week for 4 weeks. Then Inject 0.50 MG into skin once a week for 4 weeks. (Please call when you are 2 weeks into this dose). (Patient not taking: Reported on 05/18/2022) 3 mL 1   torsemide (DEMADEX) 20 MG tablet Take 1 tablet (20 mg total) by mouth daily. (Patient taking differently: Take 20 mg by mouth as needed.) 90 tablet 3   No current facility-administered medications for this visit.    Allergies Codeine, Meloxicam, and Paroxetine hcl   Physical Exam:  BP 139/81   Pulse 72  There is no height or weight on file to calculate BMI. General appearance: Well nourished, well developed female in no acute distress.  Cardiovascular: normal s1 and s2.  No murmurs, rubs or gallops. Respiratory:  Clear to auscultation bilateral. Normal respiratory effort Abdomen: positive bowel sounds and no masses, hernias; diffusely non tender to palpation, non distended Neuro/Psych:  Normal mood and affect.  Skin:  Warm and dry.  Lymphatic:  No inguinal lymphadenopathy.   Pelvic exam: is not limited by body habitus EGBUS: within normal limits, mild atrophy Vagina: mild atrophy with white/watery discharge in vault Cervix: normal appearing cervix without tenderness, discharge or lesions. Uterus:  nonenlarged  and non tender Adnexa:  normal adnexa and no mass, fullness, tenderness Rectovaginal: deferred  Patient declines embx  attempt  Laboratory: no new labs  Radiology:  Narrative & Impression  CLINICAL DATA:  Postmenopausal bleeding; history of endometrial ablation and endometriosis   EXAM: TRANSABDOMINAL AND TRANSVAGINAL ULTRASOUND OF PELVIS   DOPPLER ULTRASOUND OF OVARIES   TECHNIQUE: Both transabdominal and transvaginal ultrasound examinations of the pelvis were performed. Transabdominal technique was performed for global imaging of the pelvis including uterus, ovaries, adnexal regions, and pelvic cul-de-sac.   It was necessary to proceed with endovaginal exam following the transabdominal exam to visualize the endometrium. Color and duplex Doppler ultrasound was utilized to evaluate blood flow to the ovaries.   COMPARISON:  None Available.   FINDINGS: Uterus   Measurements: 9.0 x 5.1 x 5.3 cm = volume: 126 mL. 0.7 x 1.2 x 0.7 cm fibroid in the anterior left uterine fundus.   Endometrium   Thickness: 4 mm. Mass within the endometrium measuring 3.3 x 3.1 x 3.2 cm with internal vascularity.   Right ovary   Measurements: 3.8 x 1.3 x 2.3 cm = volume: 6 mL. Normal appearance/no adnexal mass.   Left ovary   Measurements: 3.6 x 2.0 x 1.8 cm = volume: 7 mL. Normal appearance/no adnexal mass.   Pulsed Doppler evaluation of both ovaries demonstrates normal low-resistance arterial and venous waveforms.   Other findings   No abnormal free fluid.   IMPRESSION: 3.3 cm mass within the endometrial cavity is nonspecific and differential considerations include submucosal fibroid, large polyp, or malignancy. Gynecologic consult is recommended.   These results will be called to the ordering clinician or representative by the Radiologist Assistant, and communication documented in the PACS or [employer].     Electronically Signed   By: Placido Sou M.D.   On: 04/29/2022 19:39    Assessment: pt stable  Plan:  1. Vulvar irritation Normal exam today. I told her I recommend PRN  usage of ointment and if s/s persist to contact us. ?dermatitis at her last visit  2. Postmenopausal bleeding I told her I recommended trying again for embx today b/c I think Dr. Ernestina Patches may have been getting in the cavity but was hitting the intra-cavitary mass, but patient declines. Regardless, I told her I recommend OR evaluation given persistent PMB and u/s findings to rule out cancer. I've reviewed the u/s images and it looks more like a fibroid (?submucosal) more so than a polyp. I told her I recommend hysteroscopy with myomectomy and/or polypectomy. Since her bleeding is minimal, I recommend holding off on adding on another medication to her list. I told her that if her bleeding every becomes heavy to contact us for progestin therapy.   Patient had a repeat low dose CT scan on 10/16 from her PCP due to a h/o tobacco use, emphysema. 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 Her cardiology added on losartan for better BP control. Her pulmonologist just recommend a repeat low dose CT in a year to f/u the paraspinal mass  She sees CT surgery in mid November for a consult   She would like to have any potential surgery done before the end of the year since she has met her deductible. I don't have any OR time prior to that, so I will contact my partners to see about any available time, but I  told her that all consultants will need to sign off on her surgery prior to being able to go.  3. Cervical stenosis (uterine cervix) See above  4. Paraspinal mass See above  5. Ascending aorta dilatation (HCC) See above  6. Cardiology I spoke to Dr. Garen Lah on 10/4 and for a hysteroscopy, he recommended, "to hold Eliquis 48 hours prior to procedure, restart as soon as safely possible after procedure.  Continue metoprolol as prescribed.  Previous echocardiograms have showed normal ejection  fraction." Can touch base with him again closer to surgery to make sure no changes given her new findings on 10/16 necessitating a CT surgery referral   Durene Romans MD Attending [facility])

## 2022-05-31 ENCOUNTER — Encounter: Payer: Self-pay | Admitting: Cardiology

## 2022-05-31 LAB — CERVICOVAGINAL ANCILLARY ONLY
Bacterial Vaginitis (gardnerella): NEGATIVE
Candida Glabrata: NEGATIVE
Candida Vaginitis: NEGATIVE
Chlamydia: NEGATIVE
Comment: NEGATIVE
Comment: NEGATIVE
Comment: NEGATIVE
Comment: NEGATIVE
Comment: NEGATIVE
Comment: NORMAL
Neisseria Gonorrhea: NEGATIVE
Trichomonas: NEGATIVE

## 2022-06-04 ENCOUNTER — Encounter: Payer: Self-pay | Admitting: Nurse Practitioner

## 2022-06-04 NOTE — Telephone Encounter (Signed)
I called [facility] per patient request to find out what orders are needed.   The orders

## 2022-06-13 ENCOUNTER — Encounter (HOSPITAL_BASED_OUTPATIENT_CLINIC_OR_DEPARTMENT_OTHER): Payer: Self-pay

## 2022-06-13 ENCOUNTER — Institutional Professional Consult (permissible substitution) (HOSPITAL_BASED_OUTPATIENT_CLINIC_OR_DEPARTMENT_OTHER): Payer: BC Managed Care – PPO | Admitting: Obstetrics & Gynecology

## 2022-06-14 ENCOUNTER — Encounter (HOSPITAL_BASED_OUTPATIENT_CLINIC_OR_DEPARTMENT_OTHER): Payer: Self-pay | Admitting: Obstetrics & Gynecology

## 2022-06-14 ENCOUNTER — Ambulatory Visit (INDEPENDENT_AMBULATORY_CARE_PROVIDER_SITE_OTHER): Payer: BC Managed Care – PPO | Admitting: Obstetrics & Gynecology

## 2022-06-14 VITALS — BP 163/75 | HR 63 | Ht 64.0 in | Wt 231.4 lb

## 2022-06-14 DIAGNOSIS — I7121 Aneurysm of the ascending aorta, without rupture: Secondary | ICD-10-CM | POA: Diagnosis not present

## 2022-06-14 DIAGNOSIS — N950 Postmenopausal bleeding: Secondary | ICD-10-CM

## 2022-06-14 DIAGNOSIS — I480 Paroxysmal atrial fibrillation: Secondary | ICD-10-CM

## 2022-06-17 NOTE — Progress Notes (Signed)
GYNECOLOGY  VISIT  CC:   discuss possible surgical procedure  HPI: 56 y.o. G59P2012 Married White or Caucasian female here for discussion of additional evaluation of PMP bleeding.  Pt did have several years of no bleeding until this year when bleeding, spotting began.  Pt is continuing to have some intermittent spotting since it started.  Two attempts at endometrial biopsy have been attempted by Dr. Ernestina Patches and Dr. Ilda Basset.  Pt declines trying an office attempted biopsy again.  Evaluation has included ultrasound was 04/27/2022 with uterus measuring 9.0 x 5.1 x 5.3cm with volume 158m.  Small 1.2cm anterior fibroid noted.  Endometrium is 492mwith endometrial mass measuring 3.3cm.  Internal vascularity noted on ultrasound.  Reviewed images personally.  She does have hx of endometrial ablation in 2006.  Pap was negative with neg HR HPV 04/16/2022.  Due to continued bleeding and no pathology obtained with attempted biopsy recommend pt proceed with additional evaluation with hysteroscopy with possible removal of endometrial mass and dilation and curettage.  Procedure discussed with patient.  Recovery and pain management discussed.  Risks discussed including but not limited to bleeding, rare risk of transfusion, infection, 1% risk of uterine perforation with risks of fluid deficit causing cardiac arrythmia, cerebral swelling and/or need to stop procedure early.  Fluid emboli and rare risk of death discussed.  DVT/PE, rare risk of risk of bowel/bladder/ureteral/vascular injury.  Patient aware if pathology abnormal she may need additional treatment.  All questions answered.  Her husband was with her today.  Also discussed with pt doing this under ultrasound guidance.     Past Medical History:  Diagnosis Date   Anxiety    Arthritis    Chest pain    normal coronary arteries by cardiac cath oct 29,2014   Colon polyp    Depression    [place]   Fatty liver    Heart disease    History of  blood transfusion    Hypertension    PAF (paroxysmal atrial fibrillation) (HCC)    Prediabetes    Skin cancer, basal cell    Thyroid nodule     MEDS:   Current Outpatient Medications on File Prior to Visit  Medication Sig Dispense Refill   albuterol (VENTOLIN HFA) 108 (90 Base) MCG/ACT inhaler Inhale 2 puffs into the lungs every 6 (six) hours as needed for wheezing or shortness of breath. 8 g 2   ARIPiprazole (ABILIFY) 10 MG tablet Take 0.5 tablets (5 mg total) by mouth daily. 45 tablet 1   Cholecalciferol ([employer]S VIT D 5000 HIGH-POTENCY PO) Take by mouth daily in the afternoon.     clobetasol ointment (TEMOVATE) 0.05 % Apply to affected area every night for 4 weeks, then every other day for 4 weeks and then twice a week for 4 weeks or until resolution. 30 g 5   ELIQUIS 5 MG TABS tablet TAKE 1 TABLET BY MOUTH TWICE A DAY 180 tablet 3   fluticasone-salmeterol (ADVAIR DISKUS) 250-50 MCG/ACT AEPB Inhale 1 puff into the lungs in the morning and at bedtime. 60 each 1   losartan (COZAAR) 25 MG tablet Take 1 tablet (25 mg total) by mouth daily. 30 tablet 3   metoprolol succinate (TOPROL-XL) 50 MG 24 hr tablet Take 1.5 tablets (75 mg total) by mouth 2 (two) times daily. 270 tablet 0   metoprolol tartrate (LOPRESSOR) 25 MG tablet TAKE 1 TABLET BY MOUTH 2 TIMES DAILY AS NEEDED. 180 tablet 0   mirtazapine (REMERON) 15 MG tablet  TAKE 1 TABLET BY MOUTH EVERYDAY AT BEDTIME 90 tablet 1   pantoprazole (PROTONIX) 40 MG tablet TAKE 1 TABLET BY MOUTH EVERY DAY 90 tablet 0   prednisoLONE acetate (PRED FORTE) 1 % ophthalmic suspension 1 drop 4 (four) times daily.     traMADol (ULTRAM) 50 MG tablet Take 50 mg by mouth daily as needed.     torsemide (DEMADEX) 20 MG tablet Take 1 tablet (20 mg total) by mouth daily. (Patient taking differently: Take 20 mg by mouth as needed.) 90 tablet 3   No current facility-administered medications on file prior to visit.    ALLERGIES: Codeine, Meloxicam, and Paroxetine  hcl  SH:  married,  does smoke  Review of Systems  Genitourinary:        PMP bleeding    PHYSICAL EXAMINATION:    BP (!) 163/75 (BP Location: Left Arm, Patient Position: Sitting, Cuff Size: Large)   Pulse 63   Ht '5\' 4"'$  (1.626 m) Comment: Reported  Wt 231 lb 6.4 oz (105 kg)   BMI 39.72 kg/m     General appearance: alert, cooperative and appears stated age Neck: no adenopathy, supple, symmetrical, trachea midline and thyroid normal to inspection and palpation CV:  Regular rate and rhythm Lungs:  clear to auscultation, no wheezes, rales or rhonchi, symmetric air entry  Assessment/Plan: 1. Postmenopausal bleeding - hysteroscopy with possible removal of endometrial lesion discussed.  Will be planned with ultrasound guidance.  Questions answered.  Pt ready to proceed with scheduling  2. Aneurysm of ascending aorta without rupture (HCC) - mild aneurysm noted on screening CT of chest.  Has consult appt next Tuesday.  Will make sure this is going to be followed conservatively prior to scheduling  3. Paroxysmal atrial fibrillation (HCC) - on eliquis.  Dr. Ilda Basset had already reviewed with cardiology with stopping Eliquis for 2 days prior to procedure recommended.

## 2022-06-19 ENCOUNTER — Telehealth: Payer: Self-pay | Admitting: Nurse Practitioner

## 2022-06-19 ENCOUNTER — Encounter: Payer: Self-pay | Admitting: Cardiology

## 2022-06-19 ENCOUNTER — Institutional Professional Consult (permissible substitution) (INDEPENDENT_AMBULATORY_CARE_PROVIDER_SITE_OTHER): Payer: BC Managed Care – PPO | Admitting: Surgical

## 2022-06-19 ENCOUNTER — Encounter (HOSPITAL_BASED_OUTPATIENT_CLINIC_OR_DEPARTMENT_OTHER): Payer: Self-pay | Admitting: Obstetrics & Gynecology

## 2022-06-19 ENCOUNTER — Encounter: Payer: Self-pay | Admitting: *Deleted

## 2022-06-19 VITALS — BP 154/89 | HR 61 | Resp 20 | Ht 64.0 in | Wt 230.0 lb

## 2022-06-19 DIAGNOSIS — I7121 Aneurysm of the ascending aorta, without rupture: Secondary | ICD-10-CM

## 2022-06-19 NOTE — Telephone Encounter (Signed)
Can we let the patient know that I have reviewed the MRI of her thoracic spine. 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 Can we forward the results to her neurosurgeons office please. Looks to be Dr. Newman Pies  The report is on my desk

## 2022-06-19 NOTE — Progress Notes (Unsigned)
Subjective:     Patient ID: Victoria Williamson, female    DOB: April 18, 1966, 56 y.o.   MRN: [id number]  Chief Complaint  Patient presents with   Thoracic Aortic Aneurysm    CT chest 10/16    HPI Patient is in today for the patient was seen on today's date consultation due to findings on recent CT scan that showed a 4.0 thoracic aortic aneurysm.  This is a screening CT for lung cancer.  Patient is a smoker but has recently changed from cigarettes to vaping.  She knows that it is important to try to quit completely.  She does have occasional shortness of breath with exertion.  Additionally from a cardiovascular perspective she has occasional palpitations and lower extremity edema.  She does not report chest pain.  She has hypertension which she does keep a diary and this is notable for the fact that her blood pressure is fairly poorly controlled.  He has recently been started on an ARB but it does appear the dosage is inadequate.  She is also on a beta-blocker.  She has had a recent OB/GYN evaluation and needs hysteroscopy with possible removal of an endometrial lesion.  Her current aneurysmal finding will not affect the ability to proceed with this procedure.  Review of Systems  Constitutional:  Positive for malaise/fatigue.       Recent weight gain  Respiratory:  Positive for shortness of breath.   Cardiovascular:  Positive for palpitations, orthopnea and leg swelling.  Gastrointestinal:  Positive for heartburn.  Skin: Negative.   Endo/Heme/Allergies:  Bruises/bleeds easily.  Psychiatric/Behavioral:  The patient is nervous/anxious.     Past Medical History:  Diagnosis Date   Anxiety    Arthritis    Chest pain    normal coronary arteries by cardiac cath oct 29,2014   Colon polyp    Depression    Dysautonomia ([place]) 12/18/2013   Fatty liver    Heart disease    History of blood transfusion    Hypertension    PAF (paroxysmal atrial fibrillation) ([place])    Prediabetes    Skin  cancer, basal cell    Thyroid nodule     Patient Active Problem List   Diagnosis Date Noted   Cervical stenosis (uterine cervix) 05/29/2022   Vulvar irritation 05/29/2022   Endometrial mass 05/29/2022   Ascending aorta dilatation (HCC) 05/29/2022   Paraspinal mass 05/29/2022   Pre-diabetes 05/18/2022   Other fatigue 05/18/2022   DOE (dyspnea on exertion) 05/18/2022   Pulmonary emphysema ([place]) 05/18/2022   Esophageal dysphagia 05/18/2022   Right conjunctivitis 04/23/2022   Hematuria 12/01/2021   Abnormal uterine bleeding (AUB) 12/01/2021   Former smoker 10/20/2021   Chronic bilateral low back pain without sciatica 05/15/2021   COVID-19 04/25/2021   Obesity (BMI 30-39.9) 03/17/2021   Bipolar depression ([place]) 03/17/2021   Preventative health care 03/17/2021   Vitamin D deficiency 03/17/2021   Skin lesion 03/17/2021   SVT (supraventricular tachycardia) 11/23/2020   Arthritis    Carpal tunnel syndrome of left wrist 02/04/2020   Chronic heart failure with preserved ejection fraction ([place]) 01/11/2020   Hepatic steatosis 12/18/2019   High serum thyroid stimulating hormone (TSH) 12/18/2019   Lower extremity edema 12/17/2019   Neck swelling 12/17/2019   Paroxysmal atrial fibrillation (HCC) 12/17/2019   Lumbar radiculitis 01/16/2019   Tubular adenoma 08/22/2018   B12 deficiency 01/14/2018   Essential hypertension 05/19/2014   Gastroesophageal reflux disease without esophagitis 05/19/2014   Vasovagal syndrome  12/25/2013   Anxiety and depression 12/20/2013   Dysautonomia ([place]) 12/18/2013     Current Outpatient Medications  Medication Instructions   albuterol (VENTOLIN HFA) 108 (90 Base) MCG/ACT inhaler 2 puffs, Inhalation, Every 6 hours PRN   ARIPiprazole (ABILIFY) 5 mg, Oral, Daily   Cholecalciferol ([employer] VIT D 5000 HIGH-POTENCY PO) Oral, Daily   clobetasol ointment (TEMOVATE) 0.05 % Apply to affected area every night for 4 weeks, then every other day for 4 weeks and then twice a  week for 4 weeks or until resolution.   ELIQUIS 5 MG TABS tablet TAKE 1 TABLET BY MOUTH TWICE A DAY   fluticasone-salmeterol (ADVAIR DISKUS) 250-50 MCG/ACT AEPB 1 puff, Inhalation, 2 times daily   losartan (COZAAR) 25 mg, Oral, Daily   metoprolol succinate (TOPROL-XL) 75 mg, Oral, 2 times daily   metoprolol tartrate (LOPRESSOR) 25 MG tablet TAKE 1 TABLET BY MOUTH 2 TIMES DAILY AS NEEDED.   mirtazapine (REMERON) 15 MG tablet TAKE 1 TABLET BY MOUTH EVERYDAY AT BEDTIME   pantoprazole (PROTONIX) 40 mg, Oral, Daily   prednisoLONE acetate (PRED FORTE) 1 % ophthalmic suspension 1 drop, 4 times daily   torsemide (DEMADEX) 20 mg, Oral, Daily   traMADol (ULTRAM) 50 mg, Oral, Daily PRN    Allergies  Allergen Reactions   Codeine Nausea And Vomiting    Confusion and could not stay awake   Meloxicam Nausea Only    And stomach cramping   Paroxetine Hcl Other (See Comments)    Sleep all the time    Social History   Occupational History   Not on file  Tobacco Use   Smoking status: Some Days    Packs/day: 1.00    Years: 37.00    Total pack years: 37.00    Types: E-cigarettes, Cigarettes    Last attempt to quit: 12/29/2019    Years since quitting: 2.4   Smokeless tobacco: Never  Vaping Use   Vaping Use: Every day  Substance and Sexual Activity   Alcohol use: Not Currently   Drug use: Never   Sexual activity: Yes    Partners: Male        Objective:    Ht '5\' 4"'$  (1.626 m)   BMI 39.72 kg/m  BP Readings from Last 3 Encounters:  06/19/22 (!) 154/89  06/14/22 (!) 163/75  05/29/22 139/81   Wt Readings from Last 3 Encounters:  06/19/22 230 lb (104.3 kg)  06/14/22 231 lb 6.4 oz (105 kg)  05/18/22 233 lb (105.7 kg)      Physical Exam Vitals reviewed.  Constitutional:      General: She is not in acute distress.    Appearance: Normal appearance.  HENT:     Head: Normocephalic and atraumatic.  Eyes:     General: No scleral icterus. Neck:     Vascular: No carotid bruit.   Cardiovascular:     Rate and Rhythm: Normal rate and regular rhythm.     Heart sounds: No murmur heard.    Comments: She has bilateral pedal edema making palpation of pulses difficult. Pulmonary:     Effort: Pulmonary effort is normal.     Breath sounds: Normal breath sounds.  Abdominal:     Palpations: Abdomen is soft.     Tenderness: There is no abdominal tenderness.  Musculoskeletal:     Cervical back: Normal range of motion.     Right lower leg: Edema present.     Left lower leg: Edema present.  Skin:    General:  Skin is warm and dry.     Capillary Refill: Capillary refill takes less than 2 seconds.  Neurological:     General: No focal deficit present.     Mental Status: She is alert.  Psychiatric:        Thought Content: Thought content normal.        Judgment: Judgment normal.    Narrative & Impression  CLINICAL DATA:  Current smoker with 38 pack-year history   EXAM: CT CHEST WITHOUT CONTRAST LOW-DOSE FOR LUNG CANCER SCREENING   TECHNIQUE: Multidetector CT imaging of the chest was performed following the standard protocol without IV contrast.   RADIATION DOSE REDUCTION: This exam was performed according to the departmental dose-optimization program which includes automated exposure control, adjustment of the mA and/or kV according to patient size and/or use of iterative reconstruction technique.   COMPARISON:  Cancer screening CT dated May 19, 2021; PET-CT dated July 07, 2020   FINDINGS: Cardiovascular: Normal heart size. No pericardial effusion. Mildly dilated ascending thoracic aorta, measuring up to 4.0 cm, unchanged when compared with the prior. No significant atherosclerotic disease of the thoracic aorta. No visible coronary artery calcifications.   Mediastinum/Nodes: Small hiatal hernia. Thyroid is unremarkable. Pathologically enlarged lymph nodes seen in the chest. 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   Lungs/Pleura: Central airways are patent. Mild paraseptal emphysema. No consolidation, pleural effusion or pneumothorax. Stable small bilateral solid pulmonary nodules, largest is a subpleural nodule of the left lung apex measuring 5.3 cm in mean diameter on image 35.   Upper Abdomen: No acute abnormality.   Musculoskeletal: No chest wall mass or suspicious bone lesions identified.   IMPRESSION: 1. Lung-RADS 2, benign appearance or behavior. Continue annual screening with low-dose chest CT without contrast in 12 months. 2. 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 Contrast-enhanced MRI of the thoracic spine could be performed for definitive characterization. 3. Mildly dilated ascending thoracic aorta, measuring up to 4.0 cm. Recommend annual imaging followup by CTA or MRA. This recommendation follows 2010 ACCF/AHA/AATS/ACR/ASA/SCA/SCAI/SIR/STS/SVM Guidelines for the Diagnosis and Management of Patients with Thoracic Aortic Disease. Circulation. 2010; 121: D220-U542. Aortic aneurysm NOS (ICD10-I71.9) 4. Emphysema (ICD10-J43.9).     Electronically Signed   By: Yetta Glassman M.D.   On: 05/22/2022 08:48        Assessment & Plan:   4.0 cm ascending thoracic aortic aneurysm as described in the above scan.  He will require yearly surveillance CT scan at this point.  This should not interfere with planned gynecological procedure.  We discussed the importance of blood pressure control and she knows that she needs to contact her prescribing physician that according to her blood pressure log her control is poor at this time.  She will at least require increasing dosage of her ARB and potentially additional medication.  We discussed the importance of smoking cessation completely and that vaping  still is a potential long-term harm.  We discussed lifestyle medicine in terms of basic nutrition and exercise as well as proper sleep and obtaining enough sunlight.  She has a fair amount of stress in her life that hopefully can improve.    Problem List Items Addressed This Visit   None   No orders of the defined types were placed in this encounter.  No follow-ups on file.  John Giovanni, PA-C

## 2022-06-19 NOTE — Telephone Encounter (Signed)
Information faxed over

## 2022-06-19 NOTE — Patient Instructions (Signed)
Activity and lifestyle modifications as discussed in the office  Discussed smoking cessation including vaping  Contacting physician to reevaluate blood pressure.

## 2022-06-20 ENCOUNTER — Telehealth: Payer: Self-pay | Admitting: Cardiology

## 2022-06-20 NOTE — Telephone Encounter (Signed)
Pt c/o medication issue:  1. Name of Medication:   losartan (COZAAR) 25 MG tablet   2. How are you currently taking this medication (dosage and times per day)? As prescribed  3. Are you having a reaction (difficulty breathing--STAT)?   No  4. What is your medication issue?   Patient stated her BP readings has been running high and she had a visit with her Thoracic surgeon and he recommended that her Losartan be adjusted.  Patient stated her BP reading yesterday around 1 pm was 153/89.  Today BP was 150/81.

## 2022-06-21 MED ORDER — LOSARTAN POTASSIUM 50 MG PO TABS: 50.0000 mg | ORAL_TABLET | Freq: Every day | ORAL | 3 refills | Status: DC

## 2022-06-21 NOTE — Telephone Encounter (Signed)
Dr. Garen Lah replied to patients MyChart message as follows:  June 20, 2022 Kate Sable, MD  to [facility] "Victoria Williamson"  06/20/22  5:02 PM Plan to increase losartan to 50 mg daily.  Last read by [name] "[name]" at  5:04 PM on 06/20/2022.

## 2022-06-22 ENCOUNTER — Ambulatory Visit
Admission: RE | Admit: 2022-06-22 | Discharge: 2022-06-22 | Disposition: A | Discharge status: Home / Self Care | Payer: BC Managed Care – PPO | Source: Ambulatory Visit | Attending: Nurse Practitioner | Admitting: Nurse Practitioner

## 2022-06-22 DIAGNOSIS — Z1231 Encounter for screening mammogram for malignant neoplasm of breast: Secondary | ICD-10-CM | POA: Diagnosis present

## 2022-06-25 ENCOUNTER — Encounter (HOSPITAL_BASED_OUTPATIENT_CLINIC_OR_DEPARTMENT_OTHER): Payer: Self-pay

## 2022-06-25 ENCOUNTER — Other Ambulatory Visit: Payer: Self-pay | Admitting: Nurse Practitioner

## 2022-06-25 ENCOUNTER — Encounter (HOSPITAL_BASED_OUTPATIENT_CLINIC_OR_DEPARTMENT_OTHER): Payer: Self-pay | Admitting: Obstetrics & Gynecology

## 2022-06-25 ENCOUNTER — Encounter: Payer: Self-pay | Admitting: Nurse Practitioner

## 2022-06-25 ENCOUNTER — Other Ambulatory Visit (HOSPITAL_COMMUNITY): Payer: Self-pay | Admitting: Obstetrics & Gynecology

## 2022-06-25 DIAGNOSIS — N950 Postmenopausal bleeding: Secondary | ICD-10-CM

## 2022-06-25 DIAGNOSIS — N63 Unspecified lump in unspecified breast: Secondary | ICD-10-CM

## 2022-06-25 DIAGNOSIS — R928 Other abnormal and inconclusive findings on diagnostic imaging of breast: Secondary | ICD-10-CM

## 2022-06-26 ENCOUNTER — Other Ambulatory Visit: Payer: Self-pay | Admitting: Nurse Practitioner

## 2022-06-27 ENCOUNTER — Encounter: Payer: Self-pay | Admitting: Nurse Practitioner

## 2022-06-27 NOTE — Telephone Encounter (Signed)
There was a telephone noted on 06/19/2022 to call and discuss the MRI and fax it to the Neurosurgeon. I see there was a reply that states it was faxed but not sure if the patient was contacted

## 2022-06-27 NOTE — Telephone Encounter (Signed)
There is an MRI in care everywhere done by [facility].

## 2022-06-29 ENCOUNTER — Other Ambulatory Visit: Payer: Self-pay | Admitting: Nurse Practitioner

## 2022-07-02 ENCOUNTER — Encounter (HOSPITAL_BASED_OUTPATIENT_CLINIC_OR_DEPARTMENT_OTHER): Payer: Self-pay | Admitting: Obstetrics & Gynecology

## 2022-07-02 ENCOUNTER — Ambulatory Visit (INDEPENDENT_AMBULATORY_CARE_PROVIDER_SITE_OTHER): Payer: BC Managed Care – PPO | Admitting: Nurse Practitioner

## 2022-07-02 VITALS — BP 128/80 | HR 63 | Temp 98.4°F | Resp 14 | Ht 64.0 in | Wt 231.0 lb

## 2022-07-02 DIAGNOSIS — J439 Emphysema, unspecified: Secondary | ICD-10-CM

## 2022-07-02 DIAGNOSIS — R0609 Other forms of dyspnea: Secondary | ICD-10-CM | POA: Diagnosis not present

## 2022-07-02 NOTE — Assessment & Plan Note (Signed)
Has improved with addition of Advair maintenance inhaler.  Patient's not had to use her albuterol inhaler any.

## 2022-07-02 NOTE — Assessment & Plan Note (Signed)
Noted on LDCT patient was placed on Advair and seems to have improvement.  States she is not getting both doses every day as sometimes she forgets in the morning is running late.  She is rinsing her mouth after each use.  Continue Advair and albuterol as needed

## 2022-07-02 NOTE — Patient Instructions (Signed)
Nice to see you today I want to see you in 6 months for a recheck, sooner if you need me

## 2022-07-02 NOTE — Progress Notes (Signed)
   Established Patient Office Visit  Subjective   Patient ID: Victoria Williamson, female    DOB: 04/03/1966  Age: 56 y.o. MRN: [id number]  Chief Complaint  Patient presents with   follow up on inhaler/breathing issues    Using Advair, some days forgets to use 2 times daily. Has not had to use Albuterol inhaler since last visit. Breathing is better    HPI   DOE/ emphysema: States that she was started on advair and sometimes she forgets to get the first dose but will get the second dose in.  Patient states she is rinsing her mouth out after each use with the Advair inhaler.  States she has not had to use her albuterol inhaler any feels like shortness of breath has improved  Swallowing: Still having some difficulty with food and liquid. Mainly food. States that the GI office has reached out to her via telephone and MyChart and she has a number to call when she is ready to get scheduled up.   Review of Systems  Constitutional:  Negative for chills and fever.  Respiratory:  Negative for shortness of breath.   Cardiovascular:  Negative for chest pain.  Neurological:  Negative for tingling and headaches.      Objective:     BP 128/80   Pulse 63   Temp 98.4 F (36.9 C) (Oral)   Resp 14   Ht '5\' 4"'$  (1.626 m)   Wt 231 lb (104.8 kg)   SpO2 98%   BMI 39.65 kg/m    Physical Exam Vitals and nursing note reviewed.  Constitutional:      Appearance: Normal appearance.  Cardiovascular:     Rate and Rhythm: Normal rate and regular rhythm.     Heart sounds: Normal heart sounds.  Pulmonary:     Effort: Pulmonary effort is normal.     Breath sounds: Normal breath sounds.  Neurological:     Mental Status: She is alert.      No results found for any visits on 07/02/22.    The 10-year ASCVD risk score (Arnett DK, et al., 2019) is: 7.3%    Assessment & Plan:   Problem List Items Addressed This Visit       Respiratory   Pulmonary emphysema ([place])    Noted on LDCT patient  was placed on Advair and seems to have improvement.  States she is not getting both doses every day as sometimes she forgets in the morning is running late.  She is rinsing her mouth after each use.  Continue Advair and albuterol as needed        Other   DOE (dyspnea on exertion) - Primary    Has improved with addition of Advair maintenance inhaler.  Patient's not had to use her albuterol inhaler any.       Return in about 6 months (around 12/31/2022) for CPE and Labs.    Romilda Garret, NP

## 2022-07-03 ENCOUNTER — Institutional Professional Consult (permissible substitution) (HOSPITAL_BASED_OUTPATIENT_CLINIC_OR_DEPARTMENT_OTHER): Payer: BC Managed Care – PPO | Admitting: Obstetrics & Gynecology

## 2022-07-04 ENCOUNTER — Other Ambulatory Visit: Payer: Self-pay | Admitting: Nurse Practitioner

## 2022-07-04 ENCOUNTER — Other Ambulatory Visit (HOSPITAL_BASED_OUTPATIENT_CLINIC_OR_DEPARTMENT_OTHER): Payer: Self-pay | Admitting: Obstetrics & Gynecology

## 2022-07-04 DIAGNOSIS — K219 Gastro-esophageal reflux disease without esophagitis: Secondary | ICD-10-CM

## 2022-07-04 DIAGNOSIS — Z01818 Encounter for other preprocedural examination: Secondary | ICD-10-CM

## 2022-07-04 DIAGNOSIS — N950 Postmenopausal bleeding: Secondary | ICD-10-CM

## 2022-07-05 ENCOUNTER — Telehealth (HOSPITAL_BASED_OUTPATIENT_CLINIC_OR_DEPARTMENT_OTHER): Payer: Self-pay | Admitting: *Deleted

## 2022-07-05 NOTE — Telephone Encounter (Signed)
-----   Message from Megan Salon, MD sent at 07/04/2022 11:52 AM EST ----- Regarding: FW: Surgery 12/19 Maudie Mercury, Can you please call this pt.  She has hysteroscopy scheduled for 12/19.  She needs to stop her eliquis two days prior to her surgery.  We have discussed this but wanted to make sure she remembers to do this.  Thank you.  MSM  ----- Message ----- From: Francia Greaves Sent: 06/25/2022   2:59 PM EST To: Megan Salon, MD; Nathaneil Canary Subject: Surgery 12/19                                  Done. Posted for 07/24/2022 '@0730'$  w/ Sabra Heck CPT [id number] and [phone] Dx [phone] and [phone] ----- Message ----- From: Megan Salon, MD Sent: 06/19/2022   4:07 PM EST To: Olegario Shearer [place] Subject: surgery scheduling                             Drema Balzarine, This pt needs ultrasound guided hysteroscopy with possible polyp or fibroid resection, dilation and curettage due to PMP bleeding.  This is a pt of Dr. Ilda Basset and he asked if I would take care of this procedure due to lack of surgery days for him before his paternity leave.  No assistants needed.  Thank you.  MSM

## 2022-07-05 NOTE — Telephone Encounter (Signed)
Called pt to advise that she stop taking her eliquis two days prior to her surgical procedure. Pt verbalized understanding.

## 2022-07-10 ENCOUNTER — Other Ambulatory Visit: Payer: Self-pay | Admitting: Cardiology

## 2022-07-11 ENCOUNTER — Ambulatory Visit
Admission: RE | Admit: 2022-07-11 | Discharge: 2022-07-11 | Disposition: A | Discharge status: Home / Self Care | Payer: BC Managed Care – PPO | Source: Ambulatory Visit | Attending: Nurse Practitioner | Admitting: Nurse Practitioner

## 2022-07-11 DIAGNOSIS — R928 Other abnormal and inconclusive findings on diagnostic imaging of breast: Secondary | ICD-10-CM

## 2022-07-11 DIAGNOSIS — N63 Unspecified lump in unspecified breast: Secondary | ICD-10-CM

## 2022-07-12 ENCOUNTER — Other Ambulatory Visit: Payer: Self-pay | Admitting: Nurse Practitioner

## 2022-07-12 DIAGNOSIS — N6002 Solitary cyst of left breast: Secondary | ICD-10-CM

## 2022-07-12 DIAGNOSIS — R928 Other abnormal and inconclusive findings on diagnostic imaging of breast: Secondary | ICD-10-CM

## 2022-07-13 ENCOUNTER — Other Ambulatory Visit (HOSPITAL_BASED_OUTPATIENT_CLINIC_OR_DEPARTMENT_OTHER): Payer: Self-pay | Admitting: Obstetrics & Gynecology

## 2022-07-13 ENCOUNTER — Encounter: Payer: Self-pay | Admitting: Nurse Practitioner

## 2022-07-19 ENCOUNTER — Ambulatory Visit: Payer: BC Managed Care – PPO | Admitting: Nurse Practitioner

## 2022-07-20 ENCOUNTER — Encounter (HOSPITAL_BASED_OUTPATIENT_CLINIC_OR_DEPARTMENT_OTHER): Payer: Self-pay | Admitting: Obstetrics & Gynecology

## 2022-07-20 NOTE — Progress Notes (Signed)
Spoke w/ via phone for pre-op interview--- pt Lab needs dos---- cbc, istat              Lab results------ current EKG in epic/ chart COVID test -----patient states asymptomatic no test needed Arrive at -------  0530 on 07-24-2022 NPO after MN NO Solid Food.  Clear liquids from MN until--- 0430 Med rec completed Medications to take morning of surgery ----- abilify, toprol, protonix Diabetic medication ----- n/a Patient instructed no nail polish to be worn day of surgery Patient instructed to bring photo id and insurance card day of surgery Patient aware to have Driver (ride ) / caregiver    for 24 hours after surgery -- husband, robert Patient Special Instructions ----- n/a Pre-Op special Istructions -----  pt stated was given instructions by office that they had spoke to pt's cardiologist and can stop eliquis 2 days prior to surgery, pt stated last dose will by evening of 07-21-2022. Patient verbalized understanding of instructions that were given at this phone interview. Patient denies shortness of breath, chest pain, fever, cough at this phone interview.  Anesthesia Review:  PAF s/p ablation PVI and CTI;  PSVT;  chronic diastolic CHF;  emphysema;  dysautonomia (pt stated has not had issues for few yrs);  AAA 4.0 cm  PCP:  Dr Charmian Muff  [facility] 07-02-2022 epic) Cardiologist :  Dr B. Agbar-Etang (lov 05-15-2022 epic) EP cardio:  Dr Quentin Ore  [facility] 12-27-2021) Chest x-ray : CT high resolution 05-21-2022 EKG : 05-15-2022 Echo : 07-27-2021 Stress test: no Cardiac Cath :  per cardiology notes 06-03-2013 by Dr Ubaldo Glassing '@[facility]'$ ,  normal coronaries and lvsf Activity level:  pt gets DOE w/ stairs and occasional sob w/ house hold chores  Blood Thinner/ Instructions Maryjane Hurter Dose:  Eliquis ASA / Instructions/ Last Dose : no Per pt last dose will be evening 07-21-2022 given instructions by dr Sabra Heck office

## 2022-07-23 NOTE — Anesthesia Preprocedure Evaluation (Addendum)
Anesthesia Evaluation  Patient identified by MRN, date of birth, ID band Patient awake    Reviewed: Allergy & Precautions, NPO status , Patient's Chart, lab work & pertinent test results  History of Anesthesia Complications (+) PONV and history of anesthetic complications  Airway Mallampati: II  TM Distance: >3 FB Neck ROM: Full    Dental no notable dental hx.    Pulmonary COPD, former smoker   Pulmonary exam normal        Cardiovascular hypertension, + Peripheral Vascular Disease, +CHF and + DOE  + dysrhythmias Atrial Fibrillation  Rhythm:Regular Rate:Normal     Neuro/Psych   Anxiety Depression Bipolar Disorder      GI/Hepatic Neg liver ROS,GERD  Medicated,,  Endo/Other  negative endocrine ROS    Renal/GU negative Renal ROS  Female GU complaint     Musculoskeletal  (+) Arthritis , Osteoarthritis,    Abdominal Normal abdominal exam  (+)   Peds  Hematology negative hematology ROS (+)   Anesthesia Other Findings   Reproductive/Obstetrics                             Anesthesia Physical Anesthesia Plan  ASA: 3  Anesthesia Plan: General   Post-op Pain Management: Tylenol PO (pre-op)*   Induction: Intravenous  PONV Risk Score and Plan: 4 or greater and Ondansetron, Dexamethasone, Midazolam, Treatment may vary due to age or medical condition and Scopolamine patch - Pre-op  Airway Management Planned: Mask and LMA  Additional Equipment: None  Intra-op Plan:   Post-operative Plan: Extubation in OR  Informed Consent: I have reviewed the patients History and Physical, chart, labs and discussed the procedure including the risks, benefits and alternatives for the proposed anesthesia with the patient or authorized representative who has indicated his/her understanding and acceptance.     Dental advisory given  Plan Discussed with: CRNA  Anesthesia Plan Comments:         Anesthesia Quick Evaluation

## 2022-07-24 ENCOUNTER — Ambulatory Visit (HOSPITAL_COMMUNITY)
Admission: RE | Admit: 2022-07-24 | Discharge: 2022-07-24 | Disposition: A | Discharge status: Home / Self Care | Payer: BC Managed Care – PPO | Source: Ambulatory Visit | Attending: Obstetrics & Gynecology | Admitting: Obstetrics & Gynecology

## 2022-07-24 ENCOUNTER — Encounter (HOSPITAL_BASED_OUTPATIENT_CLINIC_OR_DEPARTMENT_OTHER): Payer: Self-pay | Admitting: Obstetrics & Gynecology

## 2022-07-24 ENCOUNTER — Ambulatory Visit (HOSPITAL_BASED_OUTPATIENT_CLINIC_OR_DEPARTMENT_OTHER): Payer: BC Managed Care – PPO | Admitting: Anesthesiology

## 2022-07-24 ENCOUNTER — Other Ambulatory Visit: Payer: Self-pay

## 2022-07-24 ENCOUNTER — Ambulatory Visit (HOSPITAL_BASED_OUTPATIENT_CLINIC_OR_DEPARTMENT_OTHER)
Admission: RE | Admit: 2022-07-24 | Discharge: 2022-07-24 | Disposition: A | Discharge status: Home / Self Care | Payer: BC Managed Care – PPO | Attending: Obstetrics & Gynecology | Admitting: Obstetrics & Gynecology

## 2022-07-24 ENCOUNTER — Encounter (HOSPITAL_BASED_OUTPATIENT_CLINIC_OR_DEPARTMENT_OTHER)
Admission: RE | Disposition: A | Discharge status: Home / Self Care | Payer: Self-pay | Source: Home / Self Care | Attending: Obstetrics & Gynecology

## 2022-07-24 DIAGNOSIS — F319 Bipolar disorder, unspecified: Secondary | ICD-10-CM | POA: Diagnosis not present

## 2022-07-24 DIAGNOSIS — J449 Chronic obstructive pulmonary disease, unspecified: Secondary | ICD-10-CM | POA: Diagnosis not present

## 2022-07-24 DIAGNOSIS — Z01818 Encounter for other preprocedural examination: Secondary | ICD-10-CM

## 2022-07-24 DIAGNOSIS — Z9889 Other specified postprocedural states: Secondary | ICD-10-CM

## 2022-07-24 DIAGNOSIS — I4891 Unspecified atrial fibrillation: Secondary | ICD-10-CM | POA: Diagnosis not present

## 2022-07-24 DIAGNOSIS — I5032 Chronic diastolic (congestive) heart failure: Secondary | ICD-10-CM | POA: Diagnosis not present

## 2022-07-24 DIAGNOSIS — I739 Peripheral vascular disease, unspecified: Secondary | ICD-10-CM | POA: Diagnosis not present

## 2022-07-24 DIAGNOSIS — N950 Postmenopausal bleeding: Secondary | ICD-10-CM

## 2022-07-24 DIAGNOSIS — Z87891 Personal history of nicotine dependence: Secondary | ICD-10-CM | POA: Diagnosis not present

## 2022-07-24 DIAGNOSIS — I110 Hypertensive heart disease with heart failure: Secondary | ICD-10-CM | POA: Insufficient documentation

## 2022-07-24 DIAGNOSIS — N882 Stricture and stenosis of cervix uteri: Secondary | ICD-10-CM | POA: Insufficient documentation

## 2022-07-24 HISTORY — DX: Solitary pulmonary nodule: R91.1

## 2022-07-24 HISTORY — DX: Localized edema: R60.0

## 2022-07-24 HISTORY — DX: Nausea with vomiting, unspecified: R11.2

## 2022-07-24 HISTORY — DX: Long term (current) use of anticoagulants: Z79.01

## 2022-07-24 HISTORY — PX: OPERATIVE ULTRASOUND: SHX5996

## 2022-07-24 HISTORY — DX: Personal history of adenomatous and serrated colon polyps: Z86.0101

## 2022-07-24 HISTORY — DX: Other forms of dyspnea: R06.09

## 2022-07-24 HISTORY — DX: Emphysema, unspecified: J43.9

## 2022-07-24 HISTORY — DX: Prediabetes: R73.03

## 2022-07-24 HISTORY — DX: Chronic diastolic (congestive) heart failure: I50.32

## 2022-07-24 HISTORY — DX: Other chronic pain: G89.29

## 2022-07-24 HISTORY — DX: Spondylolisthesis, lumbar region: M43.16

## 2022-07-24 HISTORY — DX: Other intervertebral disc degeneration, lumbar region without mention of lumbar back pain or lower extremity pain: M51.369

## 2022-07-24 HISTORY — DX: Postmenopausal bleeding: N95.0

## 2022-07-24 HISTORY — DX: Low back pain, unspecified: M54.50

## 2022-07-24 HISTORY — DX: Personal history of colonic polyps: Z86.010

## 2022-07-24 HISTORY — DX: Supraventricular tachycardia, unspecified: I47.10

## 2022-07-24 HISTORY — DX: Other specified postprocedural states: R11.2

## 2022-07-24 HISTORY — DX: Major depressive disorder, single episode, unspecified: F32.9

## 2022-07-24 HISTORY — PX: HYSTEROSCOPY WITH D & C: SHX1775

## 2022-07-24 HISTORY — DX: Other intervertebral disc degeneration, lumbar region: M51.36

## 2022-07-24 HISTORY — DX: Other specified postprocedural states: Z98.890

## 2022-07-24 HISTORY — DX: Generalized anxiety disorder: F41.1

## 2022-07-24 HISTORY — DX: Personal history of other malignant neoplasm of skin: Z85.828

## 2022-07-24 LAB — CBC
HCT: 40.1 % (ref 36.0–46.0)
Hemoglobin: 13.2 g/dL (ref 12.0–15.0)
MCH: 30.1 pg (ref 26.0–34.0)
MCHC: 32.9 g/dL (ref 30.0–36.0)
MCV: 91.3 fL (ref 80.0–100.0)
Platelets: 198 10*3/uL (ref 150–400)
RBC: 4.39 MIL/uL (ref 3.87–5.11)
RDW: 12.3 % (ref 11.5–15.5)
WBC: 8.8 10*3/uL (ref 4.0–10.5)
nRBC: 0 % (ref 0.0–0.2)

## 2022-07-24 LAB — POCT I-STAT, CHEM 8
BUN: 8 mg/dL (ref 6–20)
Calcium, Ion: 1.29 mmol/L (ref 1.15–1.40)
Chloride: 104 mmol/L (ref 98–111)
Creatinine, Ser: 0.7 mg/dL (ref 0.44–1.00)
Glucose, Bld: 87 mg/dL (ref 70–99)
HCT: 40 % (ref 36.0–46.0)
Hemoglobin: 13.6 g/dL (ref 12.0–15.0)
Potassium: 3.5 mmol/L (ref 3.5–5.1)
Sodium: 143 mmol/L (ref 135–145)
TCO2: 25 mmol/L (ref 22–32)

## 2022-07-24 SURGERY — DILATATION AND CURETTAGE /HYSTEROSCOPY
Anesthesia: General | Site: Uterus

## 2022-07-24 MED ORDER — DEXAMETHASONE SODIUM PHOSPHATE 10 MG/ML IJ SOLN
INTRAMUSCULAR | Status: DC | PRN
  Administered 2022-07-24: 10 mg via INTRAVENOUS

## 2022-07-24 MED ORDER — OXYCODONE HCL 5 MG PO TABS: 5.0000 mg | ORAL_TABLET | Freq: Once | ORAL | Status: DC | PRN

## 2022-07-24 MED ORDER — LIDOCAINE 2% (20 MG/ML) 5 ML SYRINGE
INTRAMUSCULAR | Status: DC | PRN
  Administered 2022-07-24: 50 mg via INTRAVENOUS

## 2022-07-24 MED ORDER — LIDOCAINE-EPINEPHRINE 1 %-1:100000 IJ SOLN
INTRAMUSCULAR | Status: AC
  Filled 2022-07-24: qty 1

## 2022-07-24 MED ORDER — ACETAMINOPHEN 500 MG PO TABS
1000.0000 mg | ORAL_TABLET | Freq: Once | ORAL | Status: AC
  Administered 2022-07-24: 1000 mg via ORAL

## 2022-07-24 MED ORDER — SILVER NITRATE-POT NITRATE 75-25 % EX MISC
CUTANEOUS | Status: AC
  Filled 2022-07-24: qty 10

## 2022-07-24 MED ORDER — FENTANYL CITRATE (PF) 100 MCG/2ML IJ SOLN
INTRAMUSCULAR | Status: AC
  Filled 2022-07-24: qty 2

## 2022-07-24 MED ORDER — POVIDONE-IODINE 10 % EX SWAB: 2.0000 | Freq: Once | CUTANEOUS | Status: DC

## 2022-07-24 MED ORDER — ACETAMINOPHEN 500 MG PO TABS
ORAL_TABLET | ORAL | Status: AC
  Filled 2022-07-24: qty 2

## 2022-07-24 MED ORDER — MIDAZOLAM HCL 2 MG/2ML IJ SOLN
INTRAMUSCULAR | Status: DC | PRN
  Administered 2022-07-24: 2 mg via INTRAVENOUS

## 2022-07-24 MED ORDER — PROPOFOL 10 MG/ML IV BOLUS
INTRAVENOUS | Status: DC | PRN
  Administered 2022-07-24: 180 mg via INTRAVENOUS

## 2022-07-24 MED ORDER — SODIUM CHLORIDE 0.9 % IR SOLN
Status: DC | PRN
  Administered 2022-07-24: 350 mL

## 2022-07-24 MED ORDER — PROMETHAZINE HCL 12.5 MG PO TABS: 12.5000 mg | ORAL_TABLET | Freq: Four times a day (QID) | ORAL | 0 refills | Status: DC | PRN

## 2022-07-24 MED ORDER — FENTANYL CITRATE (PF) 250 MCG/5ML IJ SOLN
INTRAMUSCULAR | Status: DC | PRN
  Administered 2022-07-24: 50 ug via INTRAVENOUS

## 2022-07-24 MED ORDER — PROPOFOL 10 MG/ML IV BOLUS
INTRAVENOUS | Status: AC
  Filled 2022-07-24: qty 20

## 2022-07-24 MED ORDER — MIDAZOLAM HCL 2 MG/2ML IJ SOLN
INTRAMUSCULAR | Status: AC
  Filled 2022-07-24: qty 2

## 2022-07-24 MED ORDER — AMISULPRIDE (ANTIEMETIC) 5 MG/2ML IV SOLN: 10.0000 mg | Freq: Once | INTRAVENOUS | Status: DC | PRN

## 2022-07-24 MED ORDER — SCOPOLAMINE 1 MG/3DAYS TD PT72
1.0000 | MEDICATED_PATCH | TRANSDERMAL | Status: DC
  Administered 2022-07-24: 1.5 mg via TRANSDERMAL

## 2022-07-24 MED ORDER — LACTATED RINGERS IV SOLN: INTRAVENOUS | Status: DC

## 2022-07-24 MED ORDER — CELECOXIB 200 MG PO CAPS: 200.0000 mg | ORAL_CAPSULE | Freq: Once | ORAL | Status: DC

## 2022-07-24 MED ORDER — EPHEDRINE SULFATE-NACL 50-0.9 MG/10ML-% IV SOSY
PREFILLED_SYRINGE | INTRAVENOUS | Status: DC | PRN
  Administered 2022-07-24 (×3): 5 mg via INTRAVENOUS

## 2022-07-24 MED ORDER — SCOPOLAMINE 1 MG/3DAYS TD PT72
MEDICATED_PATCH | TRANSDERMAL | Status: AC
  Filled 2022-07-24: qty 1

## 2022-07-24 MED ORDER — OXYCODONE HCL 5 MG/5ML PO SOLN: 5.0000 mg | Freq: Once | ORAL | Status: DC | PRN

## 2022-07-24 MED ORDER — FENTANYL CITRATE (PF) 100 MCG/2ML IJ SOLN: 25.0000 ug | INTRAMUSCULAR | Status: DC | PRN

## 2022-07-24 MED ORDER — LIDOCAINE-EPINEPHRINE 1 %-1:100000 IJ SOLN
INTRAMUSCULAR | Status: DC | PRN
  Administered 2022-07-24: 8 mL

## 2022-07-24 MED ORDER — ONDANSETRON HCL 4 MG/2ML IJ SOLN
INTRAMUSCULAR | Status: DC | PRN
  Administered 2022-07-24: 4 mg via INTRAVENOUS

## 2022-07-24 SURGICAL SUPPLY — 28 items
BLADE SURG 15 STRL LF DISP TIS (BLADE) IMPLANT
BLADE SURG 15 STRL SS (BLADE) ×2
CATH FOLEY 2WAY SLVR  5CC 14FR (CATHETERS) ×2
CATH FOLEY 2WAY SLVR 5CC 14FR (CATHETERS) IMPLANT
CATH ROBINSON RED A/P 16FR (CATHETERS) ×2 IMPLANT
DEVICE MYOSURE LITE (MISCELLANEOUS) IMPLANT
DEVICE MYOSURE REACH (MISCELLANEOUS) IMPLANT
DILATOR CANAL MILEX (MISCELLANEOUS) IMPLANT
DRSG TELFA 3X8 NADH STRL (GAUZE/BANDAGES/DRESSINGS) ×2 IMPLANT
GAUZE 4X4 16PLY ~~LOC~~+RFID DBL (SPONGE) ×4 IMPLANT
GLOVE BIOGEL PI IND STRL 7.0 (GLOVE) ×2 IMPLANT
GLOVE ECLIPSE 6.5 STRL STRAW (GLOVE) ×4 IMPLANT
GOWN STRL REUS W/TWL LRG LVL3 (GOWN DISPOSABLE) ×4 IMPLANT
IV NS IRRIG 3000ML ARTHROMATIC (IV SOLUTION) ×2 IMPLANT
KIT PROCEDURE FLUENT (KITS) ×2 IMPLANT
KIT TURNOVER CYSTO (KITS) ×2 IMPLANT
LOOP CUTTING BIPOLAR 21FR (ELECTRODE) IMPLANT
PACK VAGINAL MINOR WOMEN LF (CUSTOM PROCEDURE TRAY) ×2 IMPLANT
PAD OB MATERNITY 4.3X12.25 (PERSONAL CARE ITEMS) ×2 IMPLANT
PLUG CATH AND CAP STER (CATHETERS) IMPLANT
SEAL CERVICAL OMNI LOK (ABLATOR) IMPLANT
SEAL ROD LENS SCOPE MYOSURE (ABLATOR) ×2 IMPLANT
SET CYSTO W/LG BORE CLAMP LF (SET/KITS/TRAYS/PACK) IMPLANT
SYR CONTROL 10ML LL (SYRINGE) IMPLANT
SYR TOOMEY IRRIG 70ML (MISCELLANEOUS) ×2
SYRINGE TOOMEY IRRIG 70ML (MISCELLANEOUS) IMPLANT
TOWEL OR 17X26 10 PK STRL BLUE (TOWEL DISPOSABLE) ×2 IMPLANT
WATER STERILE IRR 500ML POUR (IV SOLUTION) ×2 IMPLANT

## 2022-07-24 NOTE — Discharge Instructions (Addendum)
    NO ACETAMINOPHEN/TYLENOL until after 12:07 pm today if needed.   Post Anesthesia Home Care Instructions  Activity: Get plenty of rest for the remainder of the day. A responsible individual must stay with you for 24 hours following the procedure.  For the next 24 hours, DO NOT: -Drive a car -Paediatric nurse -Drink alcoholic beverages -Take any medication unless instructed by your physician -Make any legal decisions or sign important papers.  Meals: Start with liquid foods such as gelatin or soup. Progress to regular foods as tolerated. Avoid greasy, spicy, heavy foods. If nausea and/or vomiting occur, drink only clear liquids until the nausea and/or vomiting subsides. Call your physician if vomiting continues.  Special Instructions/Symptoms: Your throat may feel dry or sore from the anesthesia or the breathing tube placed in your throat during surgery. If this causes discomfort, gargle with warm salt water. The discomfort should disappear within 24 hours.  If you had a scopolamine patch placed behind your ear for the management of post- operative nausea and/or vomiting:  1. The medication in the patch is effective for 72 hours, after which it should be removed.  Wrap patch in a tissue and discard in the trash. Wash hands thoroughly with soap and water. 2. You may remove the patch earlier than 72 hours if you experience unpleasant side effects which may include dry mouth, dizziness or visual disturbances. 3. Avoid touching the patch. Wash your hands with soap and water after contact with the patch.

## 2022-07-24 NOTE — Anesthesia Postprocedure Evaluation (Signed)
Anesthesia Post Note  Patient: Victoria Williamson  Procedure(s) Performed: ATTEMPTED DILATATION AND CURETTAGE /HYSTEROSCOPY (Uterus) OPERATIVE ULTRASOUND     Patient location during evaluation: PACU Anesthesia Type: General Level of consciousness: awake and alert Pain management: pain level controlled Vital Signs Assessment: post-procedure vital signs reviewed and stable Respiratory status: spontaneous breathing, nonlabored ventilation, respiratory function stable and patient connected to nasal cannula oxygen Cardiovascular status: blood pressure returned to baseline and stable Postop Assessment: no apparent nausea or vomiting Anesthetic complications: no   No notable events documented.  Last Vitals:  Vitals:   07/24/22 0842 07/24/22 0857  BP: 127/78 126/76  Pulse: 65 65  Resp: 13 16  Temp:  36.5 C  SpO2: 96% 100%    Last Pain:  Vitals:   07/24/22 0857  TempSrc:   PainSc: 0-No pain                 Belenda Cruise P Dolce Sylvia

## 2022-07-24 NOTE — Transfer of Care (Signed)
Immediate Anesthesia Transfer of Care Note  Patient: Victoria Williamson  Procedure(s) Performed: ATTEMPTED DILATATION AND CURETTAGE /HYSTEROSCOPY (Uterus) OPERATIVE ULTRASOUND  Patient Location: PACU  Anesthesia Type:General  Level of Consciousness: awake, alert , and oriented  Airway & Oxygen Therapy: Patient Spontanous Breathing  Post-op Assessment: Report given to RN and Post -op Vital signs reviewed and stable  Post vital signs: Reviewed and stable  Last Vitals:  Vitals Value Taken Time  BP    Temp    Pulse 66 07/24/22 0824  Resp 13 07/24/22 0824  SpO2 96 % 07/24/22 0824  Vitals shown include unvalidated device data.  Last Pain:  Vitals:   07/24/22 0545  TempSrc: Oral         Complications: No notable events documented.

## 2022-07-24 NOTE — Op Note (Signed)
07/24/2022  9:05 AM  PATIENT:  Victoria Williamson  56 y.o. female  PRE-OPERATIVE DIAGNOSIS:  PMB  POST-OPERATIVE DIAGNOSIS:  PMB, significant cervical stenosis  PROCEDURE:  Procedure(s): ATTEMPTED DILATATION AND CURETTAGE /HYSTEROSCOPY OPERATIVE ULTRASOUND  SURGEON:  Megan Salon  ASSISTANTS: No physician assistant.  OR staff prsent.  ANESTHESIA:   general  ESTIMATED BLOOD LOSS: 0 mL  BLOOD ADMINISTERED:none   FLUIDS: 400cc LR  UOP: pt voided before going back tot he OR  SPECIMEN:  none  DISPOSITION OF SPECIMEN:  N/A  FINDINGS: significant cervical stenosis  DESCRIPTION OF OPERATION: Patient was taken to the operating room.  She is placed in the supine position. SCDs were on her lower extremities and functioning properly. General anesthesia with an LMA was administered without difficulty. Dr. Gloris Manchester, anesthesia, oversaw case.  Legs were then placed in the [place] in the low lithotomy position. The legs were lifted to the high lithotomy position and the Betadine prep was used on the inner thighs perineum and vagina x3. Patient was draped in a normal standard fashion. Pt voided before going back to the OR so no I&O cath was performed.  Ultrasound was present at bedside for assistance with the case as there is concern about cervical stenosis.  A bivalve speculum was placed the vagina. The anterior lip of the cervix was grasped with single-tooth tenaculum.  A paracervical block of 1% lidocaine mixed one-to-one with epinephrine (1:100,000 units).  8cc was used total.   Attempt at dilating pt's cervix was attempted.  Significant stenosis was present.  Lacrimal duct probes were obtained.  This was not successful.  Cervix was cut with #15 blade and then attempt made at dilating the cervix.  None of this was successful.  After multiple attempts, decision made to end procedure due to fear of cervical or uterine perforation if procedure continued.      The tenaculum was  removed from the anterior lip of the cervix. The speculum was removed from the vagina. The prep was cleansed of the patient's skin. The legs are positioned back in the supine position. Sponge, lap, needle, instrument counts were correct x2. Patient was taken to recovery in stable condition.   COUNTS:  YES  PLAN OF CARE: Transfer to PACU

## 2022-07-24 NOTE — H&P (Signed)
Victoria Williamson is an 56 y.o. female [id number] MWF here for additional evaluation of postmenopausal bleeding with hysteroscopy, possible resection of polyp or fibroid and D&C.  Ultrasound done 9/22 showed possible intracavity lesion but endometrium appeared thin at 51m.  Pap 04/16/2022 was normal.  Procedure, risks, and benefits have been discussed.  Pt has hx of endometrial ablation so this is scheduled with ultrasound guidance.  Questions answered.    Pertinent Gynecological History: Menses: post-menopausal Bleeding: post menopausal bleeding Contraception: post menopausal status DES exposure: denies Blood transfusions: none Sexually transmitted diseases: no past history Previous GYN Procedures: endometrial ablation Last mammogram: complicated cyst noted Date: 07/11/2022 Last pap: normal Date: 04/16/2022 OB History: G3, P2   Menstrual History: No LMP recorded. Patient is postmenopausal.    Past Medical History:  Diagnosis Date   Anticoagulant long-term use    eliquis--- managed by cardiology   Arthritis    Ascending aortic aneurysm ([facility] 05/2022   had surgical consult 05-21-2022 w/ wayne gold PA ,   4.0 cm per CT 05-21-2022 in epic, active survillance   Chronic low back pain    DDD (degenerative disc disease), lumbar    Diastolic [place]F, chronic [place]    followed by cardiology;    last echo 07-27-2021 ef 60-65%   DOE (dyspnea on exertion)    [place]autonomia ([place]   dx by cardiologist--- dr kCaryl Comes  [phone] pt stated no issues in passed few yrs)   Edema of both lower extremities    intermittant per pt   Fatty liver    GAD (generalized anxiety disorder)    History of adenomatous polyp of colon    Hx of skin cancer, basal cell    Hypertension    MDD (major depressive disorder)    [place]   cardiologist-  dr b. agber-etang/  EP cardiologist-- dr lQuentin Ore  PMB (postmenopausal bleeding)    PONV (postoperative nausea and  vomiting)    Pre-diabetes    PSVT (paroxysmal supraventricular tachycardia)    Pulmonary emphysema ([place]    followed by pcp   Pulmonary nodule    followed by pcp   Spondylolisthesis of lumbar region     Past Surgical History:  Procedure Laterality Date   ATRIAL FIBRILLATION ABLATION N/A 07/15/2020   Procedure: ATRIAL FIBRILLATION ABLATION;  Surgeon: LVickie Epley MD;  [facility];  Service: Cardiovascular;  Laterality: N/A;   CARDIAC CATHETERIZATION  06/03/2013   '@[facility]'$  by dr fUbaldo Glassing  per pt was told normal coronary arteries   CARDIOVERSION N/A 05/11/2020   Procedure: CARDIOVERSION;  Surgeon: AKate Sable MD;  [facility];  Service: Cardiovascular;  Laterality: N/A;   CARPAL TUNNEL RELEASE Left 06/23/2020   Procedure: CARPAL TUNNEL RELEASE;  Surgeon: KDaryll Brod MD;  [facility]  Service: Orthopedics;  Laterality: Left;  IV REGIONAL FOREARM BLOCK   CARPAL TUNNEL RELEASE Right 2000   COLONOSCOPY  2020   HYSTEROSCOPY W/ ENDOMETRIAL ABLATION  02/23/2005   '@[facility]'$ ;  w/ novasure   REVISION AMPUTATION OF FINGER Left 12/17/2000   '@[facility]'$  by dr sypher;  injury  left little finger   TUBAL LIGATION Bilateral    yrs ago    Family History  Problem Relation Age of Onset   Heart disease Mother    Diabetes Mother    COPD Mother    Hypertension Mother    Arthritis Mother    High Cholesterol Mother    Kidney  disease Mother    Cancer Father 37       kidney cancer with removal   Heart attack Father    Alcohol abuse Father    Hearing loss Father    Heart disease Father    High Cholesterol Father    Hypertension Father    Heart attack Brother 42       heart attack that caused his death   Breast cancer Maternal Aunt 49   Breast cancer Maternal Grandmother 92   Depression Paternal Grandmother    Heart attack Paternal Grandfather    Heart disease Paternal Grandfather    Diabetes Other        family HX, unspecified    Social  History:  reports that she quit smoking about 2 years ago. Her smoking use included cigarettes. She has a 37.00 pack-year smoking history. She has never used smokeless tobacco. She reports that she does not currently use alcohol. She reports that she does not use drugs.  Allergies:  Allergies  Allergen Reactions   Codeine Nausea And Vomiting    Confusion and could not stay awake   Meloxicam Nausea Only    And stomach cramping   Paroxetine Hcl Other (See Comments)    Sleep all the time    Medications Prior to Admission  Medication Sig Dispense Refill Last Dose   ARIPiprazole (ABILIFY) 10 MG tablet TAKE 1/2 TABLET BY MOUTH DAILY (Patient taking differently: Take 5 mg by mouth at bedtime.) 45 tablet 1 07/23/2022   clobetasol ointment (TEMOVATE) 0.05 % Apply to affected area every night for 4 weeks, then every other day for 4 weeks and then twice a week for 4 weeks or until resolution. (Patient taking differently: as needed. Apply to affected area every night for 4 weeks, then every other day for 4 weeks and then twice a week for 4 weeks or until resolution.) 30 g 5 Past Week   ELIQUIS 5 MG TABS tablet TAKE 1 TABLET BY MOUTH TWICE A DAY (Patient taking differently: Take 5 mg by mouth 2 (two) times daily.) 180 tablet 3 07/21/2022   losartan (COZAAR) 50 MG tablet Take 1 tablet (50 mg total) by mouth daily. (Patient taking differently: Take 25 mg by mouth 2 (two) times daily.) 30 tablet 3 07/23/2022   metoprolol succinate (TOPROL-XL) 50 MG 24 hr tablet TAKE 1 AND 1/2 TABLETS DAILY (75 MG TOTAL) BY MOUTH WITH FOOD OR IMMEDIATELY AFTER MEALS (Patient taking differently: Take 75 mg by mouth 2 (two) times daily.) 135 tablet 1 07/24/2022 at 0410   mirtazapine (REMERON) 15 MG tablet TAKE 1 TABLET BY MOUTH EVERYDAY AT BEDTIME (Patient taking differently: Take 15 mg by mouth at bedtime.) 90 tablet 1 07/22/2022   pantoprazole (PROTONIX) 40 MG tablet TAKE 1 TABLET BY MOUTH EVERY DAY (Patient taking differently:  Take 40 mg by mouth daily.) 90 tablet 1 07/24/2022 at 0410   traMADol (ULTRAM) 50 MG tablet Take 50 mg by mouth daily as needed for moderate pain or severe pain.   Past Month   albuterol (VENTOLIN HFA) 108 (90 Base) MCG/ACT inhaler Inhale 2 puffs into the lungs every 6 (six) hours as needed for wheezing or shortness of breath. 8 g 2 More than a month   Cholecalciferol ([employer] VIT D 5000 HIGH-POTENCY PO) Take by mouth daily in the afternoon. (Patient not taking: Reported on 07/20/2022)   Unknown   fluticasone-salmeterol (ADVAIR DISKUS) 250-50 MCG/ACT AEPB Inhale 1 puff into the lungs in the morning and at  bedtime. 60 each 1 More than a month   torsemide (DEMADEX) 20 MG tablet Take 1 tablet (20 mg total) by mouth daily. (Patient taking differently: Take 20 mg by mouth as needed.) 90 tablet 3 More than a month    Review of Systems  Blood pressure (!) 152/108, pulse (!) 108, temperature 97.7 F (36.5 C), temperature source Oral, resp. rate 17, height '5\' 4"'$  (1.626 m), weight 104.6 kg, SpO2 96 %. Physical Exam Constitutional:      Appearance: Normal appearance.  Cardiovascular:     Rate and Rhythm: Normal rate.  Pulmonary:     Effort: Pulmonary effort is normal.  Neurological:     General: No focal deficit present.     Mental Status: She is alert.  Psychiatric:        Mood and Affect: Mood normal.     Results for orders placed or performed during the hospital encounter of 07/24/22 (from the past 24 hour(s))  CBC     Status: None   Collection Time: 07/24/22  6:20 AM  Result Value Ref Range   WBC 8.8 4.0 - 10.5 K/uL   RBC 4.39 3.87 - 5.11 MIL/uL   Hemoglobin 13.2 12.0 - 15.0 g/dL   HCT 40.1 36.0 - 46.0 %   MCV 91.3 80.0 - 100.0 fL   MCH 30.1 26.0 - 34.0 pg   MCHC 32.9 30.0 - 36.0 g/dL   RDW 12.3 11.5 - 15.5 %   Platelets 198 150 - 400 K/uL   nRBC 0.0 0.0 - 0.2 %  I-STAT, chem 8     Status: None   Collection Time: 07/24/22  6:23 AM  Result Value Ref Range   Sodium 143 135 - 145 mmol/L    Potassium 3.5 3.5 - 5.1 mmol/L   Chloride 104 98 - 111 mmol/L   BUN 8 6 - 20 mg/dL   Creatinine, Ser 0.70 0.44 - 1.00 mg/dL   Glucose, Bld 87 70 - 99 mg/dL   Calcium, Ion 1.29 1.15 - 1.40 mmol/L   TCO2 25 22 - 32 mmol/L   Hemoglobin 13.6 12.0 - 15.0 g/dL   HCT 40.0 36.0 - 46.0 %    No results found.  Assessment/Plan: 56 yo G3P2 MWF with PMP bleeding here for additional evaluation with hysteroscopy and possible resection of endometrial lesion, D&C.  Questions answered.  Pt ready to proceed.  Megan Salon 07/24/2022, 7:08 AM

## 2022-07-24 NOTE — Anesthesia Procedure Notes (Signed)
Procedure Name: LMA Insertion Date/Time: 07/24/2022 7:41 AM  Performed by: Clearnce Sorrel, CRNAPre-anesthesia Checklist: Patient identified, Emergency Drugs available, Suction available and Patient being monitored Patient Re-evaluated:Patient Re-evaluated prior to induction Oxygen Delivery Method: Circle System Utilized Preoxygenation: Pre-oxygenation with 100% oxygen Induction Type: IV induction Ventilation: Mask ventilation without difficulty LMA: LMA inserted LMA Size: 4.0 Number of attempts: 1 Airway Equipment and Method: Bite block Placement Confirmation: positive ETCO2 Tube secured with: Tape Dental Injury: Teeth and Oropharynx as per pre-operative assessment

## 2022-07-25 ENCOUNTER — Encounter (HOSPITAL_BASED_OUTPATIENT_CLINIC_OR_DEPARTMENT_OTHER): Payer: Self-pay | Admitting: Obstetrics & Gynecology

## 2022-07-25 ENCOUNTER — Other Ambulatory Visit (HOSPITAL_BASED_OUTPATIENT_CLINIC_OR_DEPARTMENT_OTHER): Payer: Self-pay | Admitting: Obstetrics & Gynecology

## 2022-07-25 DIAGNOSIS — N858 Other specified noninflammatory disorders of uterus: Secondary | ICD-10-CM

## 2022-07-25 DIAGNOSIS — N950 Postmenopausal bleeding: Secondary | ICD-10-CM

## 2022-07-25 DIAGNOSIS — N882 Stricture and stenosis of cervix uteri: Secondary | ICD-10-CM

## 2022-07-26 ENCOUNTER — Encounter (HOSPITAL_BASED_OUTPATIENT_CLINIC_OR_DEPARTMENT_OTHER): Payer: Self-pay | Admitting: Obstetrics & Gynecology

## 2022-07-27 ENCOUNTER — Ambulatory Visit
Admission: RE | Admit: 2022-07-27 | Discharge: 2022-07-27 | Disposition: A | Discharge status: Home / Self Care | Payer: BC Managed Care – PPO | Source: Ambulatory Visit | Attending: Nurse Practitioner | Admitting: Nurse Practitioner

## 2022-07-27 ENCOUNTER — Other Ambulatory Visit (HOSPITAL_BASED_OUTPATIENT_CLINIC_OR_DEPARTMENT_OTHER): Payer: Self-pay | Admitting: Obstetrics & Gynecology

## 2022-07-27 DIAGNOSIS — N882 Stricture and stenosis of cervix uteri: Secondary | ICD-10-CM

## 2022-07-27 DIAGNOSIS — N950 Postmenopausal bleeding: Secondary | ICD-10-CM

## 2022-07-27 DIAGNOSIS — R928 Other abnormal and inconclusive findings on diagnostic imaging of breast: Secondary | ICD-10-CM

## 2022-07-27 DIAGNOSIS — N6002 Solitary cyst of left breast: Secondary | ICD-10-CM | POA: Diagnosis present

## 2022-07-27 DIAGNOSIS — N858 Other specified noninflammatory disorders of uterus: Secondary | ICD-10-CM

## 2022-07-27 MED ORDER — LIDOCAINE HCL (PF) 1 % IJ SOLN
8.0000 mL | Freq: Once | INTRAMUSCULAR | Status: AC
  Administered 2022-07-27: 8 mL

## 2022-07-31 ENCOUNTER — Encounter (HOSPITAL_BASED_OUTPATIENT_CLINIC_OR_DEPARTMENT_OTHER): Payer: Self-pay | Admitting: Obstetrics & Gynecology

## 2022-08-07 DIAGNOSIS — Z9889 Other specified postprocedural states: Secondary | ICD-10-CM

## 2022-08-07 DIAGNOSIS — N950 Postmenopausal bleeding: Secondary | ICD-10-CM

## 2022-08-12 ENCOUNTER — Encounter (INDEPENDENT_AMBULATORY_CARE_PROVIDER_SITE_OTHER): Payer: BC Managed Care – PPO | Admitting: Cardiology

## 2022-08-12 ENCOUNTER — Encounter (HOSPITAL_BASED_OUTPATIENT_CLINIC_OR_DEPARTMENT_OTHER): Payer: Self-pay | Admitting: Obstetrics & Gynecology

## 2022-08-12 DIAGNOSIS — Z8639 Personal history of other endocrine, nutritional and metabolic disease: Secondary | ICD-10-CM

## 2022-08-12 DIAGNOSIS — R7303 Prediabetes: Secondary | ICD-10-CM | POA: Diagnosis not present

## 2022-08-13 ENCOUNTER — Encounter: Payer: Self-pay | Admitting: Nurse Practitioner

## 2022-08-13 NOTE — Telephone Encounter (Signed)
Do you see any contraindication with taking this supplement

## 2022-08-13 NOTE — Telephone Encounter (Signed)
Please see the MyChart message reply(ies) for my assessment and plan.    This patient gave consent for this Medical Advice Message and is aware that it may result in a bill to their insurance company, as well as the possibility of receiving a bill for a co-payment or deductible. They are an established patient, but are not seeking medical advice exclusively about a problem treated during an in person or video visit in the last seven days. I did not recommend an in person or video visit within seven days of my reply.    I spent a total of 12 minutes cumulative time within 7 days through MyChart messaging.  Ellaina Schuler Agbor-Etang, MD   

## 2022-08-13 NOTE — Telephone Encounter (Signed)
OmegaXL is a mild fish oil-like product that should be ok with her medications. I would advise against the use of NSAIDs (ibuprofen, naproxen, Aleve, Advil, Motrin, Goodys) and aspirin while using this product since it can increase bleeding risk with Eliquis.   OmegaXL contains mussel-oil extract as opposed to fish oil. The product does not list the quantities of DHA or EPA but the total strength of the blend is 300 mg, meaning there are very low doses of omega-3 in the product (most fish oil products contain ~1000 mg of omega-3)

## 2022-08-15 ENCOUNTER — Encounter: Payer: Self-pay | Admitting: Nurse Practitioner

## 2022-08-16 ENCOUNTER — Ambulatory Visit: Payer: BC Managed Care – PPO | Admitting: Cardiology

## 2022-08-16 ENCOUNTER — Other Ambulatory Visit (HOSPITAL_BASED_OUTPATIENT_CLINIC_OR_DEPARTMENT_OTHER): Payer: Self-pay | Admitting: *Deleted

## 2022-08-16 ENCOUNTER — Telehealth (HOSPITAL_BASED_OUTPATIENT_CLINIC_OR_DEPARTMENT_OTHER): Payer: Self-pay | Admitting: Obstetrics & Gynecology

## 2022-08-16 DIAGNOSIS — N950 Postmenopausal bleeding: Secondary | ICD-10-CM

## 2022-08-16 DIAGNOSIS — N858 Other specified noninflammatory disorders of uterus: Secondary | ICD-10-CM

## 2022-08-16 DIAGNOSIS — N882 Stricture and stenosis of cervix uteri: Secondary | ICD-10-CM

## 2022-08-16 NOTE — Telephone Encounter (Signed)
Called pt to discuss MRI results.  MRI showed uterus measuring 10.2 x 5.2 x 5.8cm.  4.1cm fibroid seen posteriorly which has a submucosal component displacing the endometrial cavity anteriorly.  Three small 1cm intramural fibroids noted.  Additional pedunculated fibroid seen measuring 3.9cm.  No abnormal endometrial thickening seen.  Left ovary with 2.2cm simple cyst.  Due to significant cervical stenosis and inability to dilate cervix in the operating room, procedure was ended.  Pt and I discussed possible options for treatment.  We agreed she would call with any future bleeding and I would try to see her that day if possible.  If bleeding is coming from the cervix, possibly a biopsy could be attempted that day.  Or we would need to possibly discuss hysterectomy at that time.  Pt comfortable with plan.   MRI from [facility] printed and will be scanned into this EMR for easier access.

## 2022-08-23 ENCOUNTER — Encounter (HOSPITAL_BASED_OUTPATIENT_CLINIC_OR_DEPARTMENT_OTHER): Payer: Self-pay | Admitting: Obstetrics & Gynecology

## 2022-08-23 ENCOUNTER — Encounter: Payer: Self-pay | Admitting: Cardiology

## 2022-08-24 ENCOUNTER — Other Ambulatory Visit: Payer: Self-pay | Admitting: *Deleted

## 2022-08-24 MED ORDER — METOPROLOL SUCCINATE ER 50 MG PO TB24: ORAL_TABLET | ORAL | 3 refills | Status: DC

## 2022-09-03 ENCOUNTER — Other Ambulatory Visit: Payer: Self-pay

## 2022-09-03 MED ORDER — LOSARTAN POTASSIUM 50 MG PO TABS: 50.0000 mg | ORAL_TABLET | Freq: Every day | ORAL | 0 refills | Status: DC

## 2022-09-14 ENCOUNTER — Ambulatory Visit: Payer: BC Managed Care – PPO | Admitting: Cardiology

## 2022-10-05 ENCOUNTER — Encounter: Payer: Self-pay | Admitting: Nurse Practitioner

## 2022-10-05 ENCOUNTER — Ambulatory Visit: Payer: BC Managed Care – PPO | Admitting: Nurse Practitioner

## 2022-10-05 VITALS — BP 120/78 | HR 73 | Temp 97.6°F | Ht 64.0 in | Wt 232.0 lb

## 2022-10-05 DIAGNOSIS — I10 Essential (primary) hypertension: Secondary | ICD-10-CM | POA: Diagnosis not present

## 2022-10-05 DIAGNOSIS — E669 Obesity, unspecified: Secondary | ICD-10-CM | POA: Diagnosis not present

## 2022-10-05 DIAGNOSIS — K219 Gastro-esophageal reflux disease without esophagitis: Secondary | ICD-10-CM | POA: Diagnosis not present

## 2022-10-05 DIAGNOSIS — K760 Fatty (change of) liver, not elsewhere classified: Secondary | ICD-10-CM

## 2022-10-05 MED ORDER — WEGOVY 0.25 MG/0.5ML ~~LOC~~ SOAJ: 0.2500 mg | SUBCUTANEOUS | 0 refills | Status: DC

## 2022-10-05 NOTE — Assessment & Plan Note (Signed)
Patient is working on lifestyle modifications.  She is to cut down sugary drinks that she consumes.  Did have difficulty exercising given her lungs and back.  She is doing treadmill at home.  She would benefit from weight loss inclusive of reduction GERD, hopefully easier breathing with exertion, hopeful reduction in fatty infiltrates to her liver.  Will try Wegovy to see if this covered under patient's plan.

## 2022-10-05 NOTE — Patient Instructions (Signed)
Nice to see you today Take the 5,000 IU every other day of the vitamin D Keep your appointment as scheduled with me

## 2022-10-05 NOTE — Progress Notes (Signed)
Established Patient Office Visit  Subjective   Patient ID: Victoria Williamson, female    DOB: 08-Sep-1965  Age: 57 y.o. MRN: [id number]  Chief Complaint  Patient presents with   Obesity    Would like to see if she is good candidate for medication    HPI  Obesity: states that she is eating 1-2 meals a day. States that it depends on her back pain. States that it is home cooked meals. Soda throughout the day. 20 oz that she will have 3 a day. States that if she goes out to eat she will get water. States that she is doing physical therapy exercises sometimes she can do some but cannot do them all the time   States that she does have a treadmill at home and will try 10-1 two times a week.   Patient denies family or personal history of medullary thyroid cancer, multiple endocrine neoplasia syndrome type II, or pancreatitis   Handicap placard: Patient request renewal for handicap placard.  States that she does have a long walk from her car to her place of employment.  She does suffer from back pain that has been ongoing.  Also suffers from pulmonary emphysema secondary to history of smoking and has dyspnea on exertion.   Review of Systems  Constitutional:  Negative for chills and fever.  Respiratory:  Negative for shortness of breath.   Cardiovascular:  Negative for chest pain.  Neurological:  Negative for headaches.  Psychiatric/Behavioral:  Negative for hallucinations and suicidal ideas.       Objective:     BP 120/78   Pulse 73   Temp 97.6 F (36.4 C) (Temporal)   Ht '5\' 4"'$  (1.626 m)   Wt 232 lb (105.2 kg)   SpO2 98%   BMI 39.82 kg/m  BP Readings from Last 3 Encounters:  10/05/22 120/78  07/24/22 126/76  07/02/22 128/80   Wt Readings from Last 3 Encounters:  10/05/22 232 lb (105.2 kg)  07/24/22 230 lb 8 oz (104.6 kg)  07/02/22 231 lb (104.8 kg)      Physical Exam Vitals and nursing note reviewed.  Constitutional:      Appearance: Normal appearance.   Cardiovascular:     Rate and Rhythm: Normal rate and regular rhythm.     Heart sounds: Normal heart sounds.  Pulmonary:     Effort: Pulmonary effort is normal.     Breath sounds: Normal breath sounds.  Neurological:     Mental Status: She is alert.      No results found for any visits on 10/05/22.    The 10-year ASCVD risk score (Arnett DK, et al., 2019) is: 2.7%    Assessment & Plan:   Problem List Items Addressed This Visit       Cardiovascular and Mediastinum   Essential hypertension   Relevant Medications   Semaglutide-Weight Management (WEGOVY) 0.25 MG/0.5ML SOAJ     Digestive   Gastroesophageal reflux disease without esophagitis   Hepatic steatosis   Relevant Medications   Semaglutide-Weight Management (WEGOVY) 0.25 MG/0.5ML SOAJ     Other   Obesity (BMI 30-39.9) - Primary    Patient is working on lifestyle modifications.  She is to cut down sugary drinks that she consumes.  Did have difficulty exercising given her lungs and back.  She is doing treadmill at home.  She would benefit from weight loss inclusive of reduction GERD, hopefully easier breathing with exertion, hopeful reduction in fatty infiltrates to her liver.  Will try Wegovy to see if this covered under patient's plan.      Relevant Medications   Semaglutide-Weight Management (WEGOVY) 0.25 MG/0.5ML SOAJ    Return if symptoms worsen or fail to improve, for As scheduled.    Romilda Garret, NP

## 2022-10-13 ENCOUNTER — Other Ambulatory Visit: Payer: Self-pay | Admitting: Cardiology

## 2022-10-15 MED ORDER — LOSARTAN POTASSIUM 50 MG PO TABS: 50.0000 mg | ORAL_TABLET | Freq: Every day | ORAL | 3 refills | Status: DC

## 2022-10-24 ENCOUNTER — Encounter: Payer: Self-pay | Admitting: Cardiology

## 2022-10-24 ENCOUNTER — Ambulatory Visit: Payer: BC Managed Care – PPO | Attending: Cardiology | Admitting: Cardiology

## 2022-10-24 VITALS — BP 124/86 | HR 64 | Ht 64.0 in | Wt 230.8 lb

## 2022-10-24 DIAGNOSIS — I480 Paroxysmal atrial fibrillation: Secondary | ICD-10-CM | POA: Diagnosis not present

## 2022-10-24 DIAGNOSIS — I10 Essential (primary) hypertension: Secondary | ICD-10-CM | POA: Diagnosis not present

## 2022-10-24 DIAGNOSIS — I77810 Thoracic aortic ectasia: Secondary | ICD-10-CM

## 2022-10-24 DIAGNOSIS — Z6839 Body mass index (BMI) 39.0-39.9, adult: Secondary | ICD-10-CM

## 2022-10-24 NOTE — Progress Notes (Signed)
Cardiology Office Note:    Date:  10/24/2022   ID:  Victoria Williamson, DOB 04-Apr-1966, MRN [id number]  PCP:  Michela Pitcher, NP  [facility] Cardiologist:  Kate Sable, MD  [facility] Electrophysiologist:  Vickie Epley, MD   Referring MD: Michela Pitcher, NP   Chief Complaint  Patient presents with   Follow-up    3 month f/u, CT result questions, HTN home readings     History of Present Illness:    Victoria Williamson is a 57 y.o. female with a hx of hypertension, paroxysmal atrial fibrillation s/p ablation 07/2020, SVT, obesity who presents for follow-up.   Feels well, denies palpitations, denies any bleeding issues with taking Eliquis.  Losartan was added to BP regimen, blood pressure better controlled.  Has some questions about ascending aorta dilatation.   Prior notes Echo 07/2021 EF 60 to 65%, borderline ascending aorta dilatation 38 mm.  Patient was seen in the emergency room 2 months ago due to palpitations and weakness.  EKG on 03/17/2020 while in the ED showed SVT, heart rate 147.    Review of EMR showed patient had a PET myocardial perfusion stress test 01/2020 with no evidence of ischemia.   Echocardiogram on 12/2019 showed normal systolic function, normal wall thickness, EF [id number], grade 2 diastolic dysfunction.   Had a left heart cath in 2014 and was told she had normal coronary arteries.  Patient states having symptoms of weakness/fatigue, shortness of breath whenever she goes into atrial fibrillation.  Initially diagnosed with atrial fibrillation in February 2021.    Past Medical History:  Diagnosis Date   Anticoagulant long-term use    eliquis--- managed by cardiology   Arthritis    Ascending aortic aneurysm ([place]) 05/2022   had surgical consult 05-21-2022 w/ wayne gold PA ,   4.0 cm per CT 05-21-2022 in epic, active survillance   Chronic low back pain    DDD (degenerative disc disease), lumbar    Diastolic CHF, chronic ([place])    followed by  cardiology;    last echo 07-27-2021 ef 60-65%   DOE (dyspnea on exertion)    Dysautonomia ([place]) 12/18/2013   dx by cardiologist--- dr Caryl Comes   [phone]  pt stated no issues in passed few yrs)   Edema of both lower extremities    intermittant per pt   Fatty liver    GAD (generalized anxiety disorder)    History of adenomatous polyp of colon    Hx of skin cancer, basal cell    Hypertension    MDD (major depressive disorder)    PAF (paroxysmal atrial fibrillation) ([place]) 09/2019   cardiologist-  dr b. agber-etang/  EP cardiologist-- dr Quentin Ore   PMB (postmenopausal bleeding)    PONV (postoperative nausea and vomiting)    Pre-diabetes    PSVT (paroxysmal supraventricular tachycardia)    Pulmonary emphysema ([place])    followed by pcp   Pulmonary nodule    followed by pcp   Spondylolisthesis of lumbar region     Past Surgical History:  Procedure Laterality Date   ATRIAL FIBRILLATION ABLATION N/A 07/15/2020   Procedure: ATRIAL FIBRILLATION ABLATION;  Surgeon: Vickie Epley, MD;  Location: [facility];  Service: Cardiovascular;  Laterality: N/A;   CARDIAC CATHETERIZATION  06/03/2013   @[facility]  by dr Ubaldo Glassing;  per pt was told normal coronary arteries   CARDIOVERSION N/A 05/11/2020   Procedure: CARDIOVERSION;  Surgeon: Kate Sable, MD;  Location: [facility];  Service: Cardiovascular;  Laterality: N/A;   CARPAL TUNNEL RELEASE Left 06/23/2020   Procedure: CARPAL TUNNEL RELEASE;  Surgeon: [name], MD;  Location: [place];  Service: Orthopedics;  Laterality: Left;  IV REGIONAL FOREARM BLOCK   CARPAL TUNNEL RELEASE Right 2000   COLONOSCOPY  2020   HYSTEROSCOPY W/ ENDOMETRIAL ABLATION  02/23/2005   @[facility] ;  w/ novasure   HYSTEROSCOPY WITH D & C N/A 07/24/2022   Procedure: ATTEMPTED DILATATION AND CURETTAGE /HYSTEROSCOPY;  Surgeon: Megan Salon, MD;  Location: [facility];  Service: Gynecology;  Laterality: N/A;   OPERATIVE ULTRASOUND N/A  07/24/2022   Procedure: OPERATIVE ULTRASOUND;  Surgeon: Megan Salon, MD;  Location: [facility];  Service: Gynecology;  Laterality: N/A;   REVISION AMPUTATION OF FINGER Left 12/17/2000   @[facility]  by dr sypher;  injury  left little finger   TUBAL LIGATION Bilateral    yrs ago    Current Medications: Current Meds  Medication Sig   albuterol (VENTOLIN HFA) 108 (90 Base) MCG/ACT inhaler Inhale 2 puffs into the lungs every 6 (six) hours as needed for wheezing or shortness of breath.   ARIPiprazole (ABILIFY) 10 MG tablet TAKE 1/2 TABLET BY MOUTH DAILY (Patient taking differently: Take 5 mg by mouth at bedtime.)   Cholecalciferol (CVS VIT D 5000 HIGH-POTENCY PO) Take by mouth daily in the afternoon.   clobetasol ointment (TEMOVATE) 0.05 % Apply to affected area every night for 4 weeks, then every other day for 4 weeks and then twice a week for 4 weeks or until resolution. (Patient taking differently: as needed. Apply to affected area every night for 4 weeks, then every other day for 4 weeks and then twice a week for 4 weeks or until resolution.)   ELIQUIS 5 MG TABS tablet TAKE 1 TABLET BY MOUTH TWICE A DAY (Patient taking differently: Take 5 mg by mouth 2 (two) times daily.)   losartan (COZAAR) 50 MG tablet Take 1 tablet (50 mg total) by mouth daily.   metoprolol succinate (TOPROL-XL) 50 MG 24 hr tablet Take 1.5 tablets (75 mg) by mouth twice daily. Take with or immediately following a meal.   mirtazapine (REMERON) 15 MG tablet TAKE 1 TABLET BY MOUTH EVERYDAY AT BEDTIME (Patient taking differently: Take 15 mg by mouth at bedtime.)   pantoprazole (PROTONIX) 40 MG tablet TAKE 1 TABLET BY MOUTH EVERY DAY (Patient taking differently: Take 40 mg by mouth daily.)   torsemide (DEMADEX) 20 MG tablet Take 1 tablet (20 mg total) by mouth daily. (Patient taking differently: Take 20 mg by mouth as needed.)   traMADol (ULTRAM) 50 MG tablet Take 50 mg by mouth daily as needed for moderate pain or severe  pain.     Allergies:   Codeine, Meloxicam, and Paroxetine hcl   Social History   Socioeconomic History   Marital status: Married    Spouse name: Not on file   Number of children: 2   Years of education: Not on file   Highest education level: Not on file  Occupational History   Not on file  Tobacco Use   Smoking status: Former    Packs/day: 1.00    Years: 37.00    Additional pack years: 0.00    Total pack years: 37.00    Types: Cigarettes    Quit date: 12/29/2019    Years since quitting: 2.8   Smokeless tobacco: Never  Vaping Use   Vaping Use: Every day   Substances: Nicotine   Devices:  juul  Substance and Sexual Activity   Alcohol use: Not Currently   Drug use: Never   Sexual activity: Yes    Partners: Male    Birth control/protection: Post-menopausal  Other Topics Concern   Not on file  Social History Narrative   Lives at home with husband      Fulltime: Quarry manager   Social Determinants of Health   Financial Resource Strain: Not on file  Food Insecurity: Not on file  Transportation Needs: Not on file  Physical Activity: Not on file  Stress: Not on file  Social Connections: Not on file     Family History: The patient's family history includes Alcohol abuse in her father; Arthritis in her mother; Breast cancer (age of onset: 21) in her maternal aunt; Breast cancer (age of onset: 76) in her maternal grandmother; COPD in her mother; Cancer (age of onset: 60) in her father; Depression in her paternal grandmother; Diabetes in her mother and another family member; Hearing loss in her father; Heart attack in her father and paternal grandfather; Heart attack (age of onset: 37) in her brother; Heart disease in her father, mother, and paternal grandfather; High Cholesterol in her father and mother; Hypertension in her father and mother; Kidney disease in her mother.  ROS:   Please see the history of present illness.     All other systems reviewed and are  negative.  EKGs/Labs/Other Studies Reviewed:    The following studies were reviewed today:   EKG:  EKG is  ordered today.  The ekg ordered today demonstrates normal sinus rhythm, heart rate 62  Recent Labs: 05/18/2022: TSH 3.26 07/24/2022: BUN 8; Creatinine, Ser 0.70; Hemoglobin 13.6; Platelets 198; Potassium 3.5; Sodium 143  Recent Lipid Panel    Component Value Date/Time   CHOL 168 10/20/2021 1529   TRIG 223 (H) 10/20/2021 1529   HDL 40 (L) 10/20/2021 1529   CHOLHDL 4.2 10/20/2021 1529   LDLCALC 97 10/20/2021 1529    Physical Exam:    VS:  BP 124/86 (BP Location: Left Arm, Patient Position: Sitting, Cuff Size: Large)   Pulse 64   Ht 5\' 4"  (1.626 m)   Wt 230 lb 12.8 oz (104.7 kg)   SpO2 98%   BMI 39.62 kg/m     Wt Readings from Last 3 Encounters:  10/24/22 230 lb 12.8 oz (104.7 kg)  10/05/22 232 lb (105.2 kg)  07/24/22 230 lb 8 oz (104.6 kg)     GEN:  Well nourished, well developed in no acute distress HEENT: Normal NECK: No JVD; No carotid bruits CARDIAC: Regular rate and rhythm, no murmurs RESPIRATORY:  Clear to auscultation without rales, wheezing or rhonchi  ABDOMEN: Soft, non-tender, non-distended MUSCULOSKELETAL:  No edema; No deformity  SKIN: Warm and dry NEUROLOGIC:  Alert and oriented x 3 PSYCHIATRIC:  Normal affect   ASSESSMENT:    1. PAF (paroxysmal atrial fibrillation) ([place])   2. Primary hypertension   3. BMI 39.0-39.9,adult   4. Ectatic thoracic aorta (HCC)    PLAN:    In order of problems listed above:  paroxysmal atrial fibrillation, s/p ablation 07/2020 currently in sinus rhythm.  CHA2DS2-VASc of 2. continue Toprol-XL 75 mg twice daily, Eliquis 5 mg twice daily.  Continue torsemide as needed torsemide. hypertension, controlled. continue Toprol-XL, losartan 50 mg daily. Obesity, low-calorie diet, weight loss advised.  Ozempic previously declined. Ectatic/mild ascending aorta dilatation.  CT chest 07/2020 ascending aorta measurement 40 mm,  38 mm, echo 07/2021.  Repeat echo in  about 3 to 5 years.  Follow-up in 12 months  Total encounter time more than 40 minutes  Greater than 50% was spent in counseling and coordination of care with the patient    Medication Adjustments/Labs and Tests Ordered: Current medicines are reviewed at length with the patient today.  Concerns regarding medicines are outlined above.  Orders Placed This Encounter  Procedures   EKG 12-Lead    No orders of the defined types were placed in this encounter.    Patient Instructions  Medication Instructions:   Your physician recommends that you continue on your current medications as directed. Please refer to the Current Medication list given to you today.  *If you need a refill on your cardiac medications before your next appointment, please call your pharmacy*   Lab Work:  None Ordered  If you have labs (blood work) drawn today and your tests are completely normal, you will receive your results only by: [place] (if you have MyChart) OR A paper copy in the mail If you have any lab test that is abnormal or we need to change your treatment, we will call you to review the results.   Testing/Procedures:  None Ordered    Follow-Up: At [facility], you and your health needs are our priority.  As part of our continuing mission to provide you with exceptional heart care, we have created designated Provider Care Teams.  These Care Teams include your primary Cardiologist (physician) and Advanced Practice Providers (APPs -  Physician Assistants and Nurse Practitioners) who all work together to provide you with the care you need, when you need it.  We recommend signing up for the patient portal called "MyChart".  Sign up information is provided on this After Visit Summary.  MyChart is used to connect with patients for Virtual Visits (Telemedicine).  Patients are able to view lab/test results, encounter notes, upcoming appointments,  etc.  Non-urgent messages can be sent to your provider as well.   To learn more about what you can do with MyChart, go to [email or web address].    Your next appointment:   12 month(s)  Provider:   You may see Kate Sable, MD or one of the following Advanced Practice Providers on your designated Care Team:   Murray Hodgkins, NP [name], PA-C Cadence Kathlen Mody, PA-C Gerrie Nordmann, NP    Signed, Kate Sable, MD  10/24/2022 1:46 PM    [place]

## 2022-10-24 NOTE — Patient Instructions (Signed)
Medication Instructions:   Your physician recommends that you continue on your current medications as directed. Please refer to the Current Medication list given to you today.  *If you need a refill on your cardiac medications before your next appointment, please call your pharmacy*   Lab Work:  None Ordered  If you have labs (blood work) drawn today and your tests are completely normal, you will receive your results only by: MyChart Message (if you have MyChart) OR A paper copy in the mail If you have any lab test that is abnormal or we need to change your treatment, we will call you to review the results.   Testing/Procedures:  None Ordered    Follow-Up: At [facility], you and your health needs are our priority.  As part of our continuing mission to provide you with exceptional heart care, we have created designated Provider Care Teams.  These Care Teams include your primary Cardiologist (physician) and Advanced Practice Providers (APPs -  Physician Assistants and Nurse Practitioners) who all work together to provide you with the care you need, when you need it.  We recommend signing up for the patient portal called "MyChart".  Sign up information is provided on this After Visit Summary.  MyChart is used to connect with patients for Virtual Visits (Telemedicine).  Patients are able to view lab/test results, encounter notes, upcoming appointments, etc.  Non-urgent messages can be sent to your provider as well.   To learn more about what you can do with MyChart, go to [email or web address].    Your next appointment:   12 month(s)  Provider:   You may see Brian Agbor-Etang, MD or one of the following Advanced Practice Providers on your designated Care Team:   Christopher Berge, NP Ryan Dunn, PA-C Cadence Furth, PA-C Sheri Hammock, NP  

## 2022-11-10 ENCOUNTER — Other Ambulatory Visit: Payer: Self-pay | Admitting: Nurse Practitioner

## 2022-11-10 DIAGNOSIS — J439 Emphysema, unspecified: Secondary | ICD-10-CM

## 2022-11-15 ENCOUNTER — Other Ambulatory Visit: Payer: Self-pay | Admitting: Nurse Practitioner

## 2022-11-15 DIAGNOSIS — R0609 Other forms of dyspnea: Secondary | ICD-10-CM

## 2022-11-15 DIAGNOSIS — J439 Emphysema, unspecified: Secondary | ICD-10-CM

## 2022-11-17 ENCOUNTER — Other Ambulatory Visit: Payer: Self-pay | Admitting: Cardiology

## 2022-11-17 ENCOUNTER — Other Ambulatory Visit: Payer: Self-pay | Admitting: Nurse Practitioner

## 2022-11-17 DIAGNOSIS — I480 Paroxysmal atrial fibrillation: Secondary | ICD-10-CM

## 2022-11-19 NOTE — Telephone Encounter (Signed)
Eliquis 5mg  refill request received. Patient is 57 years old, weight-104.7kg, Crea-0.70 on 07/24/22, Diagnosis-Afib, and last seen by Azucena Cecil on  10/24/22. Dose is appropriate based on dosing criteria. Will send in refill to requested pharmacy.

## 2022-12-07 ENCOUNTER — Other Ambulatory Visit: Payer: Self-pay | Admitting: Nurse Practitioner

## 2022-12-07 ENCOUNTER — Other Ambulatory Visit: Payer: Self-pay | Admitting: Cardiology

## 2022-12-07 DIAGNOSIS — I480 Paroxysmal atrial fibrillation: Secondary | ICD-10-CM

## 2022-12-07 DIAGNOSIS — K219 Gastro-esophageal reflux disease without esophagitis: Secondary | ICD-10-CM

## 2022-12-07 NOTE — Telephone Encounter (Signed)
Prescription refill request for Eliquis received. Indication: Afib  Last office visit: 10/24/22 (Agbor-Etang)  Scr: 0.70 (07/24/22)  Age: 57 Weight: 104.7kg  Appropriate dose. Refill sent.

## 2022-12-22 DIAGNOSIS — I480 Paroxysmal atrial fibrillation: Secondary | ICD-10-CM | POA: Insufficient documentation

## 2022-12-22 NOTE — Progress Notes (Unsigned)
Cardiology Office Note Date:  12/22/2022  Patient ID:  Victoria Williamson, DOB 03/02/1966, MRN [id number] PCP:  Victoria Emms, NP  Cardiologist:  Victoria Odea, MD Electrophysiologist: Victoria Prude, MD  ***refresh   Chief Complaint: ***  History of Present Illness: Victoria Williamson is a 57 y.o. female with PMH notable for AF, SVT, HF; seen today for Victoria Prude, MD for routine electrophysiology followup.  She is s/p AF ablation w PVI and CTI 07/2020. She had some episodes of SVT after her ablation, felt different than her AF episodes. She was last seen by Dr. Lalla Williamson 12/2021, was feeling very well without any further SVT episodes.   *** AF burden, symptoms *** palpitations *** bleeding concerns  *** HF symptoms  Since last being seen in our clinic the patient reports doing ***.  she denies chest pain, palpitations, dyspnea, PND, orthopnea, nausea, vomiting, dizziness, syncope, edema, weight gain, or early satiety.      Past Medical History:  Diagnosis Date   Anticoagulant long-term use    eliquis--- managed by cardiology   Arthritis    Ascending aortic aneurysm ([facility]) 05/2022   had surgical consult 05-21-2022 w/ Victoria gold PA ,   4.0 cm per CT 05-21-2022 in epic, active survillance   Chronic low back pain    DDD (degenerative disc disease), lumbar    Diastolic CHF, chronic ([facility])    followed by cardiology;    last echo 07-27-2021 ef 60-65%   DOE (dyspnea on exertion)    Dysautonomia ([facility]) 12/18/2013   dx by cardiologist--- dr Graciela Husbands   [phone]  pt stated no issues in passed few yrs)   Edema of both lower extremities    intermittant per pt   Fatty liver    GAD (generalized anxiety disorder)    History of adenomatous polyp of colon    Hx of skin cancer, basal cell    Hypertension    MDD (major depressive disorder)    PAF (paroxysmal atrial fibrillation) ([facility]) 09/2019   cardiologist-  dr b. agber-etang/  EP cardiologist-- dr Victoria Williamson   PMB  (postmenopausal bleeding)    PONV (postoperative nausea and vomiting)    Pre-diabetes    PSVT (paroxysmal supraventricular tachycardia)    Pulmonary emphysema (H[facility])    followed by pcp   Pulmonary nodule    followed by pcp   Spondylolisthesis of lumbar region     Past Surgical History:  Procedure Laterality Date   ATRIAL FIBRILLATION ABLATION N/A 07/15/2020   Procedure: ATRIAL FIBRILLATION ABLATION;  Surgeon: Victoria Prude, MD;  [facility];  Service: Cardiovascular;  Laterality: N/A;   CARDIAC CATHETERIZATION  06/03/2013   @[facility]  by dr Lady Gary;  per pt was told normal coronary arteries   CARDIOVERSION N/A 05/11/2020   Procedure: CARDIOVERSION;  Surgeon: Victoria Odea, MD;  Location: [facility];  Service: Cardiovascular;  Laterality: N/A;   CARPAL TUNNEL RELEASE Left 06/23/2020   Procedure: CARPAL TUNNEL RELEASE;  Surgeon: Cindee Salt, MD;  Location: [facility];  Service: Orthopedics;  Laterality: Left;  IV REGIONAL FOREARM BLOCK   CARPAL TUNNEL RELEASE Right 2000   COLONOSCOPY  2020   HYSTEROSCOPY W/ ENDOMETRIAL ABLATION  02/23/2005   @[facility] ;  w/ novasure   HYSTEROSCOPY WITH D & C N/A 07/24/2022   Procedure: ATTEMPTED DILATATION AND CURETTAGE /HYSTEROSCOPY;  Surgeon: Jerene Bears, MD;  Location: [facility];  Service: Gynecology;  Laterality: N/A;   OPERATIVE ULTRASOUND N/A 07/24/2022  Procedure: OPERATIVE ULTRASOUND;  Surgeon: Jerene Bears, MD;  Location: [facility];  Service: Gynecology;  Laterality: N/A;   REVISION AMPUTATION OF FINGER Left 12/17/2000   @[facility]  by dr sypher;  injury  left little finger   TUBAL LIGATION Bilateral    yrs ago    Current Outpatient Medications  Medication Instructions   albuterol (VENTOLIN HFA) 108 (90 Base) MCG/ACT inhaler TAKE 2 PUFFS BY MOUTH EVERY 6 HOURS AS NEEDED FOR WHEEZE OR SHORTNESS OF BREATH   apixaban (ELIQUIS) 5 MG TABS tablet TAKE 1 TABLET BY MOUTH TWICE A DAY    ARIPiprazole (ABILIFY) 5 mg, Oral, Daily   Cholecalciferol (CVS VIT D 5000 HIGH-POTENCY PO) Oral, Daily   clobetasol ointment (TEMOVATE) 0.05 % Apply to affected area every night for 4 weeks, then every other day for 4 weeks and then twice a week for 4 weeks or until resolution.   losartan (COZAAR) 50 mg, Oral, Daily   metoprolol succinate (TOPROL-XL) 50 MG 24 hr tablet Take 1.5 tablets (75 mg) by mouth twice daily. Take with or immediately following a meal.   mirtazapine (REMERON) 15 MG tablet TAKE 1 TABLET BY MOUTH EVERYDAY AT BEDTIME   pantoprazole (PROTONIX) 40 mg, Oral, Daily   torsemide (DEMADEX) 20 mg, Oral, Daily   traMADol (ULTRAM) 50 mg, Oral, Daily PRN   Wegovy 0.25 mg, Subcutaneous, Weekly   WIXELA INHUB 250-50 MCG/ACT AEPB 1 puff, Inhalation, 2 times daily, in the morning and at bedtime.    Social History:  The patient  reports that she quit smoking about 2 years ago. Her smoking use included cigarettes. She has a 37.00 pack-year smoking history. She has never used smokeless tobacco. She reports that she does not currently use alcohol. She reports that she does not use drugs.   Family History:  *** include only if pertinent The patient's family history includes Alcohol abuse in her father; Arthritis in her mother; Breast cancer (age of onset: 54) in her maternal aunt; Breast cancer (age of onset: 21) in her maternal grandmother; COPD in her mother; Cancer (age of onset: 95) in her father; Depression in her paternal grandmother; Diabetes in her mother and another family member; Hearing loss in her father; Heart attack in her father and paternal grandfather; Heart attack (age of onset: 49) in her brother; Heart disease in her father, mother, and paternal grandfather; High Cholesterol in her father and mother; Hypertension in her father and mother; Kidney disease in her mother.***  ROS:  Please see the history of present illness. All other systems are reviewed and otherwise negative.    PHYSICAL EXAM: *** VS:  There were no vitals taken for this visit. BMI: There is no height or weight on file to calculate BMI.  GEN- The patient is well appearing, alert and oriented x 3 today.   Lungs- Clear to ausculation bilaterally, normal work of breathing.  Heart- {Blank single:19197::"Regular","Irregularly irregular"} rate and rhythm, no murmurs, rubs or gallops Extremities- {EDEMA LEVEL:28147::"No"} peripheral edema, warm, dry   EKG is not ordered. Personal review of EKG from {Blank single:19197::"today","***"} shows:  ***  Recent Labs: 05/18/2022: TSH 3.26 07/24/2022: BUN 8; Creatinine, Ser 0.70; Hemoglobin 13.6; Platelets 198; Potassium 3.5; Sodium 143  No results found for requested labs within last 365 days.   CrCl cannot be calculated (Patient's most recent lab result is older than the maximum 21 days allowed.).   Wt Readings from Last 3 Encounters:  10/24/22 230 lb 12.8 oz (104.7 kg)  10/05/22 232 lb (105.2 kg)  07/24/22 230 lb 8 oz (104.6 kg)     Additional studies reviewed include: Previous EP, cardiology notes.   TTE, 07/27/2021  1. Left ventricular ejection fraction, by estimation, is 60 to 65%. The left ventricle has normal function. The left ventricle has no regional wall motion abnormalities. There is mild left ventricular hypertrophy. Left ventricular diastolic parameters were normal.  2. Right ventricular systolic function is normal. The right ventricular size is normal. There is mildly elevated pulmonary artery systolic pressure. The estimated right ventricular systolic pressure is 36.8 mmHg.  3. The mitral valve is normal in structure. No evidence of mitral valve regurgitation. No evidence of mitral stenosis.  4. The aortic valve was not well visualized. Aortic valve regurgitation is not visualized. No aortic stenosis is present.  5. There is borderline dilatation of the ascending aorta, measuring 38 mm.  6. The inferior vena cava is normal in size with  greater than 50% respiratory variability, suggesting right atrial pressure of 3 mmHg.   ASSESSMENT AND PLAN:  #) Parox AFib #) SVT Doing well with minimal burden Cont toprol 75mg  BID   #) hypercoag d/t AFib CHA2DS2-VASc Score = 3 [CHF History: 1, HTN History: 1, Diabetes History: 0, Stroke History: 0, Vascular Disease History: 0, Age Score: 0, Gender Score: 1].  Therefore, the patient's annual risk of stroke is 3.2 %.  NOAC - eliquis 5mg  BID, appropriately dosed    {Confirm score is correct.  If not, click here to update score.  REFRESH note.  :1}   ***   #) HFpEF #) HTN     Current medicines are reviewed at length with the patient today.   The patient {ACTIONS; HAS/DOES NOT HAVE:19233} concerns regarding her medicines.  The following changes were made today:  {NONE DEFAULTED:18576}  Labs/ tests ordered today include: *** No orders of the defined types were placed in this encounter.    Disposition: Follow up with {EPMDS:28135} in {EPFOLLOW UP:28173}   Signed, Sherie Don, NP  12/22/22  9:13 PM  Electrophysiology [facility]

## 2022-12-24 ENCOUNTER — Ambulatory Visit: Payer: BC Managed Care – PPO | Admitting: Cardiology

## 2022-12-24 DIAGNOSIS — I480 Paroxysmal atrial fibrillation: Secondary | ICD-10-CM

## 2022-12-24 DIAGNOSIS — I471 Supraventricular tachycardia, unspecified: Secondary | ICD-10-CM

## 2023-01-02 ENCOUNTER — Encounter: Payer: Self-pay | Admitting: Nurse Practitioner

## 2023-01-02 ENCOUNTER — Ambulatory Visit (INDEPENDENT_AMBULATORY_CARE_PROVIDER_SITE_OTHER): Payer: BC Managed Care – PPO | Admitting: Nurse Practitioner

## 2023-01-02 VITALS — BP 128/70 | HR 64 | Temp 98.4°F | Resp 16 | Ht 64.0 in | Wt 232.4 lb

## 2023-01-02 DIAGNOSIS — E559 Vitamin D deficiency, unspecified: Secondary | ICD-10-CM

## 2023-01-02 DIAGNOSIS — J439 Emphysema, unspecified: Secondary | ICD-10-CM

## 2023-01-02 DIAGNOSIS — Z0000 Encounter for general adult medical examination without abnormal findings: Secondary | ICD-10-CM | POA: Diagnosis not present

## 2023-01-02 DIAGNOSIS — K219 Gastro-esophageal reflux disease without esophagitis: Secondary | ICD-10-CM | POA: Diagnosis not present

## 2023-01-02 DIAGNOSIS — F32A Depression, unspecified: Secondary | ICD-10-CM

## 2023-01-02 DIAGNOSIS — R7303 Prediabetes: Secondary | ICD-10-CM

## 2023-01-02 DIAGNOSIS — F419 Anxiety disorder, unspecified: Secondary | ICD-10-CM

## 2023-01-02 DIAGNOSIS — E669 Obesity, unspecified: Secondary | ICD-10-CM | POA: Diagnosis not present

## 2023-01-02 DIAGNOSIS — I77810 Thoracic aortic ectasia: Secondary | ICD-10-CM

## 2023-01-02 DIAGNOSIS — I10 Essential (primary) hypertension: Secondary | ICD-10-CM

## 2023-01-02 NOTE — Assessment & Plan Note (Signed)
Patient currently maintained on pantoprazole 40 mg daily.  Stable.  Continue.

## 2023-01-02 NOTE — Assessment & Plan Note (Signed)
Patient currently maintained on Abilify and mirtazapine.  Patient is stable on medication patient denies HI/SI/AVH.  She did mention that she has some difficulty staying asleep at night but she is taking the Abilify at bedtime will switch the Abilify to daytime and leave the mirtazapine at at nighttime

## 2023-01-02 NOTE — Patient Instructions (Signed)
Nice to see you today I will be in touch with the labs once I have them Follow up with me in 1 year, sooner if you need me  I recommend that you get your shingles vaccine

## 2023-01-02 NOTE — Assessment & Plan Note (Signed)
Patient currently on losartan 50 metoprolol 75 mg daily.  Blood pressure under great control.  Patient does have an aortic aneurysm and is followed by cardiology.  4.0 cm in nature.  Continue taking medication as prescribed follow-up with cardiologist as recommended

## 2023-01-02 NOTE — Progress Notes (Signed)
Established Patient Office Visit  Subjective   Patient ID: Victoria Williamson, female    DOB: 11-20-1965  Age: 57 y.o. MRN: [id number]  Chief Complaint  Patient presents with   Annual Exam    HPI  for complete physical and follow up of chronic conditions.  HTN: does check bp at home.  Patient currently maintained on losartan metoprolol and followed by cardiology.  Afib: Patient is on apixaban, metoprolol, losartan.  Followed by cardiology.  Emphysema: states that she does have some shortness of breath with exertion. Albuterol use is once a month   Anxiety and depression: states that she is on abilify and remeron. States that it has been tested lately.  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  Immunizations: -Tetanus: Completed in 2023 -Influenza: Completed this season -Shingles: Information discussed in office -Pneumonia: Needs updating Covid: utd  Diet: Fair diet. States that she is doing 1-2 meals a day. States that she does soda  Exercise: No regular exercise. With employment  Eye exam: Completes annually. States that she has been seen and has dry eyes. With n  a year and wear glasses Dental exam: Completes semi-annually    Colonoscopy: Completed in 2020, 2025 Lung Cancer Screening: Completed in 10/16/202  Pap smear: Victoria Safe, MD GYN.  04/16/2022  DEXA: Too young  Mammogram: Done in 2023 will be due November 2024. LDCT: Last done 05/21/2022 with recommendations to repeat in 12 months.  Sleep 830-9 and will get up at 2am cause she cannot sleep. Gets up at 530 and does not feel rested.       Review of Systems  Constitutional:  Negative for chills and fever.  Respiratory:  Negative for shortness of breath (baseline).   Cardiovascular:  Negative for chest pain and leg swelling.  Gastrointestinal:  Negative for abdominal pain, blood in stool, constipation, diarrhea, nausea and vomiting.        BM ever 2-3 days   Genitourinary:  Negative for dysuria and hematuria.  Neurological:  Negative for tingling and headaches.  Psychiatric/Behavioral:  Negative for hallucinations and suicidal ideas.       Objective:     BP 128/70   Pulse 64   Temp 98.4 F (36.9 C)   Resp 16   Ht 5\' 4"  (1.626 m)   Wt 232 lb 6 oz (105.4 kg)   SpO2 97%   BMI 39.89 kg/m  BP Readings from Last 3 Encounters:  01/02/23 128/70  10/24/22 124/86  10/05/22 120/78   Wt Readings from Last 3 Encounters:  01/02/23 232 lb 6 oz (105.4 kg)  10/24/22 230 lb 12.8 oz (104.7 kg)  10/05/22 232 lb (105.2 kg)      Physical Exam Vitals and nursing note reviewed.  Constitutional:      Appearance: Normal appearance.  HENT:     Right Ear: Tympanic membrane, ear canal and external ear normal.     Left Ear: Tympanic membrane, ear canal and external ear normal.     Mouth/Throat:     Mouth: Mucous membranes are moist.     Pharynx: Oropharynx is clear.  Eyes:     Extraocular Movements: Extraocular movements intact.     Pupils: Pupils are equal, round, and reactive to light.  Cardiovascular:     Rate and Rhythm: Normal rate and regular rhythm.     Pulses: Normal pulses.     Heart sounds: Normal heart sounds.  Pulmonary:  Effort: Pulmonary effort is normal.     Breath sounds: Normal breath sounds.  Abdominal:     General: Bowel sounds are normal. There is no distension.     Palpations: There is no mass.     Tenderness: There is no abdominal tenderness.     Hernia: No hernia is present.  Musculoskeletal:     Right lower leg: No edema.     Left lower leg: No edema.  Lymphadenopathy:     Cervical: No cervical adenopathy.  Skin:    General: Skin is warm.  Neurological:     General: No focal deficit present.     Mental Status: She is alert.     Deep Tendon Reflexes:     Reflex Scores:      Bicep reflexes are 2+ on the right side and 2+ on the left side.      Patellar reflexes are 2+ on the right side  and 2+ on the left side.    Comments: Bilateral upper and lower extremity strength 5/5  Psychiatric:        Mood and Affect: Mood normal.        Behavior: Behavior normal.        Thought Content: Thought content normal.        Judgment: Judgment normal.      No results found for any visits on 01/02/23.    The 10-year ASCVD risk score (Arnett DK, et al., 2019) is: 7.3%    Assessment & Plan:   Problem List Items Addressed This Visit       Cardiovascular and Mediastinum   Essential hypertension    Patient currently on losartan 50 metoprolol 75 mg daily.  Blood pressure under great control.  Patient does have an aortic aneurysm and is followed by cardiology.  4.0 cm in nature.  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  Was visualized on most recent CT scan of chest for lung cancer screening        Respiratory   Pulmonary emphysema (HCC)    Incidental finding on LDCT.  Patient was placed on Wixela but discontinued the inhaler.  Patient does use albuterol but infrequently no PND.  Continue albuterol as needed        Digestive   Gastroesophageal reflux disease without esophagitis    Patient currently maintained on pantoprazole 40 mg daily.  Stable.  Continue.        Other   Anxiety and depression    Patient currently maintained on Abilify and mirtazapine.  Patient is stable on medication patient denies HI/SI/AVH.  She did mention that she has some difficulty staying asleep at night but she is taking the Abilify at bedtime will switch the Abilify to daytime and leave the mirtazapine at at nighttime      Obesity (BMI 30-39.9)    Pending labs inclusive of lipid, THC, A1c.      Relevant Orders   Hemoglobin A1c   Lipid panel   Preventative health care - Primary     Discussed age-appropriate immunizations.  Did review patient's personal, surgical, social, family histories.  Patient up-to-date on all vaccinations per age range if she would like.  Did discuss shingles vaccine and my recommendation to receive it.  Patient is up-to-date  on CRC screening, cervical cancer screening, breast cancer screening.  Patient was given information at discharge about preventative healthcare maintenance with anticipatory guidance.      Relevant Orders   CBC   Comprehensive metabolic panel   TSH   Vitamin D deficiency    Last vitamin D level low pending vitamin D level today.      Prediabetes    Historical diagnosis with A1c of 5.7.  Patient is on mirtazapine and Abilify pending A1c today      Relevant Orders   Hemoglobin A1c   Lipid panel    Return in about 1 year (around 01/02/2024) for CPE and Labs.    Audria Nine, NP

## 2023-01-02 NOTE — Assessment & Plan Note (Signed)
Patient is followed by cardiology 4.0 cm in nature.  Was visualized on most recent CT scan of chest for lung cancer screening

## 2023-01-02 NOTE — Assessment & Plan Note (Signed)
Pending labs inclusive of lipid, THC, A1c.

## 2023-01-02 NOTE — Assessment & Plan Note (Signed)
Discussed age-appropriate immunizations.  Did review patient's personal, surgical, social, family histories.  Patient up-to-date on all vaccinations per age range if she would like.  Did discuss shingles vaccine and my recommendation to receive it.  Patient is up-to-date on CRC screening, cervical cancer screening, breast cancer screening.  Patient was given information at discharge about preventative healthcare maintenance with anticipatory guidance.

## 2023-01-02 NOTE — Assessment & Plan Note (Signed)
Historical diagnosis with A1c of 5.7.  Patient is on mirtazapine and Abilify pending A1c today

## 2023-01-02 NOTE — Assessment & Plan Note (Signed)
Incidental finding on LDCT.  Patient was placed on Wixela but discontinued the inhaler.  Patient does use albuterol but infrequently no PND.  Continue albuterol as needed

## 2023-01-02 NOTE — Assessment & Plan Note (Signed)
Last vitamin D level low pending vitamin D level today.

## 2023-01-03 LAB — COMPREHENSIVE METABOLIC PANEL
ALT: 43 U/L — ABNORMAL HIGH (ref 0–35)
AST: 34 U/L (ref 0–37)
Albumin: 4 g/dL (ref 3.5–5.2)
Alkaline Phosphatase: 90 U/L (ref 39–117)
BUN: 8 mg/dL (ref 6–23)
CO2: 27 mEq/L (ref 19–32)
Calcium: 9.1 mg/dL (ref 8.4–10.5)
Chloride: 105 mEq/L (ref 96–112)
Creatinine, Ser: 0.99 mg/dL (ref 0.40–1.20)
GFR: 64.06 mL/min (ref >60.00)
Glucose, Bld: 89 mg/dL (ref 70–99)
Potassium: 3.6 mEq/L (ref 3.5–5.1)
Sodium: 139 mEq/L (ref 135–145)
Total Bilirubin: 0.5 mg/dL (ref 0.2–1.2)
Total Protein: 7.3 g/dL (ref 6.0–8.3)

## 2023-01-03 LAB — TSH: TSH: 2.4 u[IU]/mL (ref 0.35–5.50)

## 2023-01-03 LAB — LIPID PANEL
Cholesterol: 135 mg/dL (ref 0–200)
HDL: 34 mg/dL — ABNORMAL LOW (ref >39.00)
LDL Cholesterol: 68 mg/dL (ref 0–99)
NonHDL: 100.65
Total CHOL/HDL Ratio: 4
Triglycerides: 161 mg/dL — ABNORMAL HIGH (ref 0.0–149.0)
VLDL: 32.2 mg/dL (ref 0.0–40.0)

## 2023-01-03 LAB — HEMOGLOBIN A1C: Hgb A1c MFr Bld: 5.5 % (ref 4.6–6.5)

## 2023-01-03 LAB — CBC
HCT: 39.1 % (ref 36.0–46.0)
Hemoglobin: 12.8 g/dL (ref 12.0–15.0)
MCHC: 32.7 g/dL (ref 30.0–36.0)
MCV: 91 fl (ref 78.0–100.0)
Platelets: 206 10*3/uL (ref 150.0–400.0)
RBC: 4.29 Mil/uL (ref 3.87–5.11)
RDW: 13.1 % (ref 11.5–15.5)
WBC: 8.7 10*3/uL (ref 4.0–10.5)

## 2023-02-14 ENCOUNTER — Other Ambulatory Visit: Payer: Self-pay

## 2023-02-14 MED ORDER — METOPROLOL SUCCINATE ER 50 MG PO TB24: ORAL_TABLET | ORAL | 3 refills | Status: DC

## 2023-05-08 ENCOUNTER — Other Ambulatory Visit: Payer: Self-pay | Admitting: Nurse Practitioner

## 2023-05-08 DIAGNOSIS — Z1231 Encounter for screening mammogram for malignant neoplasm of breast: Secondary | ICD-10-CM

## 2023-05-10 ENCOUNTER — Other Ambulatory Visit: Payer: Self-pay | Admitting: Urgent Care

## 2023-05-10 DIAGNOSIS — I77810 Thoracic aortic ectasia: Secondary | ICD-10-CM

## 2023-05-21 ENCOUNTER — Encounter: Payer: Self-pay | Admitting: *Deleted

## 2023-05-23 ENCOUNTER — Ambulatory Visit: Payer: BC Managed Care – PPO

## 2023-05-29 ENCOUNTER — Inpatient Hospital Stay
Admission: RE | Admit: 2023-05-29 | Discharge: 2023-05-29 | Disposition: A | Discharge status: Home / Self Care | Payer: No Typology Code available for payment source | Source: Ambulatory Visit | Attending: Urgent Care | Admitting: Urgent Care

## 2023-05-29 DIAGNOSIS — I77810 Thoracic aortic ectasia: Secondary | ICD-10-CM

## 2023-05-29 MED ORDER — IOPAMIDOL (ISOVUE-370) INJECTION 76%
500.0000 mL | Freq: Once | INTRAVENOUS | Status: AC | PRN
  Administered 2023-05-29: 75 mL via INTRAVENOUS

## 2023-06-11 ENCOUNTER — Ambulatory Visit: Payer: BC Managed Care – PPO

## 2023-06-24 ENCOUNTER — Ambulatory Visit
Admission: RE | Admit: 2023-06-24 | Discharge: 2023-06-24 | Disposition: A | Discharge status: Home / Self Care | Payer: No Typology Code available for payment source | Source: Ambulatory Visit | Attending: Nurse Practitioner | Admitting: Nurse Practitioner

## 2023-06-24 DIAGNOSIS — Z1231 Encounter for screening mammogram for malignant neoplasm of breast: Secondary | ICD-10-CM

## 2023-06-26 ENCOUNTER — Other Ambulatory Visit: Payer: Self-pay | Admitting: Nurse Practitioner

## 2023-06-26 DIAGNOSIS — R928 Other abnormal and inconclusive findings on diagnostic imaging of breast: Secondary | ICD-10-CM

## 2023-07-15 ENCOUNTER — Other Ambulatory Visit: Payer: Self-pay | Admitting: Nurse Practitioner

## 2023-07-15 ENCOUNTER — Ambulatory Visit
Admission: RE | Admit: 2023-07-15 | Discharge: 2023-07-15 | Disposition: A | Discharge status: Home / Self Care | Payer: No Typology Code available for payment source | Source: Ambulatory Visit | Attending: Nurse Practitioner | Admitting: Nurse Practitioner

## 2023-07-15 DIAGNOSIS — R928 Other abnormal and inconclusive findings on diagnostic imaging of breast: Secondary | ICD-10-CM

## 2023-07-15 DIAGNOSIS — R921 Mammographic calcification found on diagnostic imaging of breast: Secondary | ICD-10-CM

## 2023-07-17 ENCOUNTER — Ambulatory Visit
Admission: RE | Admit: 2023-07-17 | Discharge: 2023-07-17 | Disposition: A | Discharge status: Home / Self Care | Payer: No Typology Code available for payment source | Source: Ambulatory Visit | Attending: Nurse Practitioner | Admitting: Nurse Practitioner

## 2023-07-17 DIAGNOSIS — R921 Mammographic calcification found on diagnostic imaging of breast: Secondary | ICD-10-CM

## 2023-07-17 HISTORY — PX: BREAST BIOPSY: SHX20

## 2023-07-18 LAB — SURGICAL PATHOLOGY

## 2023-09-06 ENCOUNTER — Other Ambulatory Visit: Payer: Self-pay | Admitting: Emergency Medicine

## 2023-09-06 DIAGNOSIS — Z87891 Personal history of nicotine dependence: Secondary | ICD-10-CM

## 2023-09-06 DIAGNOSIS — Z122 Encounter for screening for malignant neoplasm of respiratory organs: Secondary | ICD-10-CM

## 2023-09-20 ENCOUNTER — Encounter: Payer: Self-pay | Admitting: Cardiology

## 2023-09-23 IMAGING — CT CT CHEST LUNG CANCER SCREENING LOW DOSE W/O CM
2 of 5 series · 15 of 40 positions shown, 18 images · non-contrast
Comparison: None.

CLINICAL DATA: Lung cancer screening. Thirty-seven pack-year
history. Current asymptomatic smoker.

EXAM:
CT CHEST WITHOUT CONTRAST LOW-DOSE FOR LUNG CANCER SCREENING
TECHNIQUE: Multidetector CT imaging of the chest was performed following the
standard protocol without IV contrast.

[Series 3: lung 1.00 · axial · 0.65mm/px · z∈[-1213,-918]mm · 12 of 325 slices shown, 15 images]
[im 15/325  mediastinal]
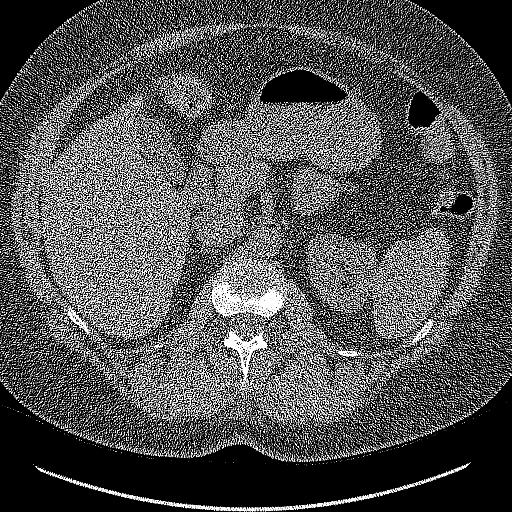
[im 15/325  lung]
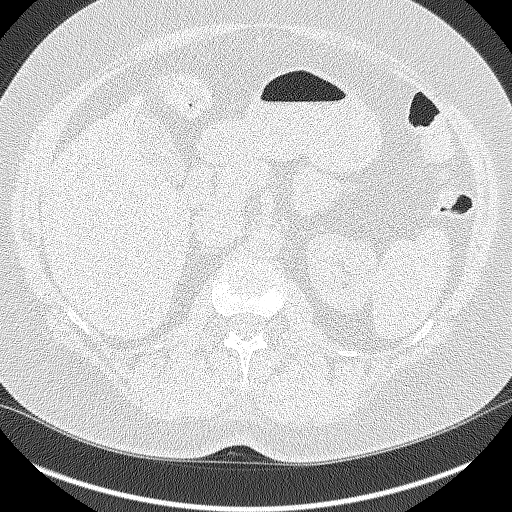
[im 45/325  lung]
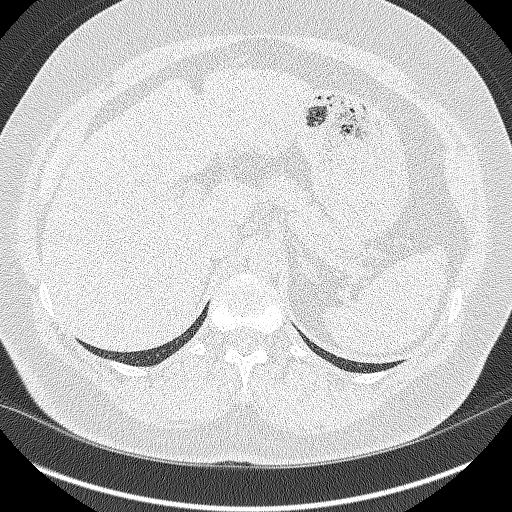
[im 74/325  lung]
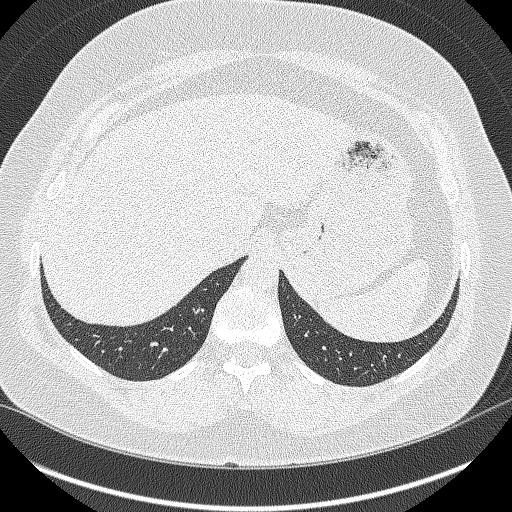
[im 104/325  lung]
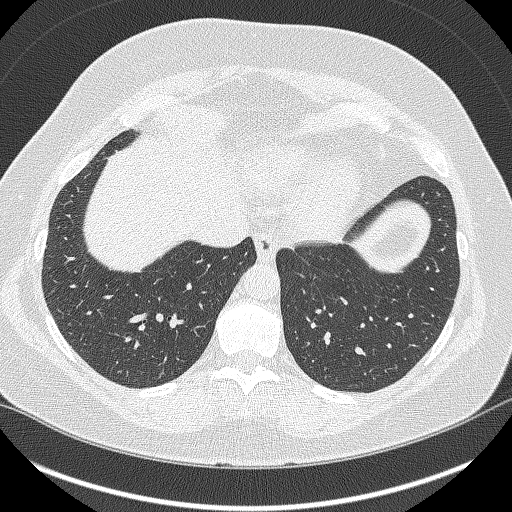
[im 118/325  mediastinal]
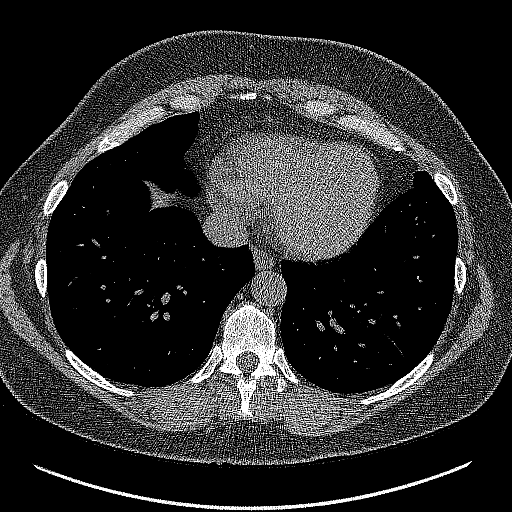
[im 118/325  lung]
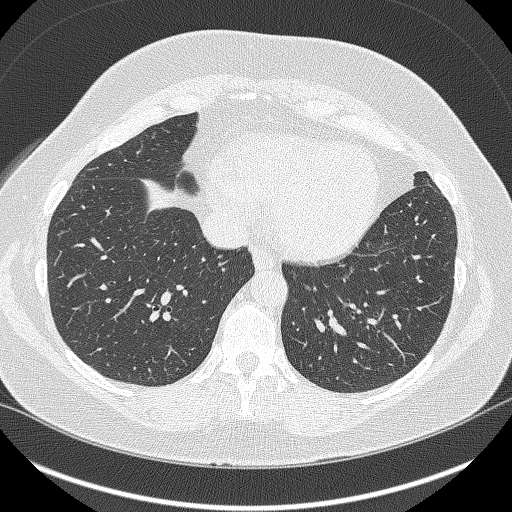
[im 148/325  lung]
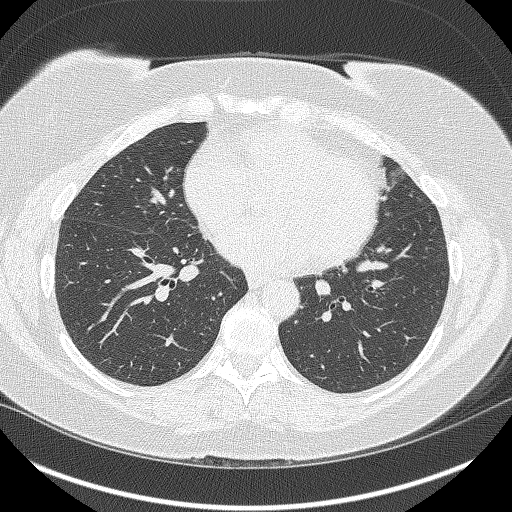
[im 177/325  lung]
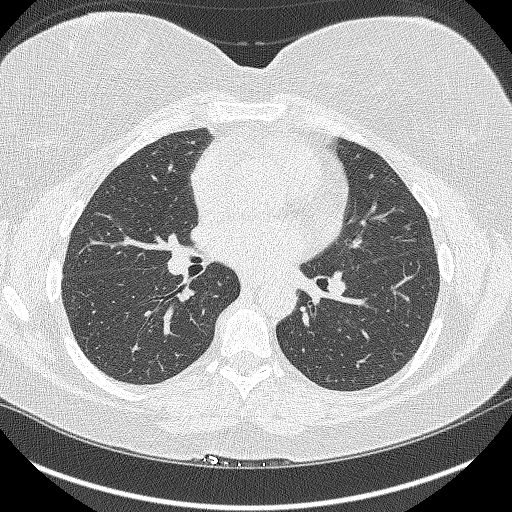
[im 207/325  lung]
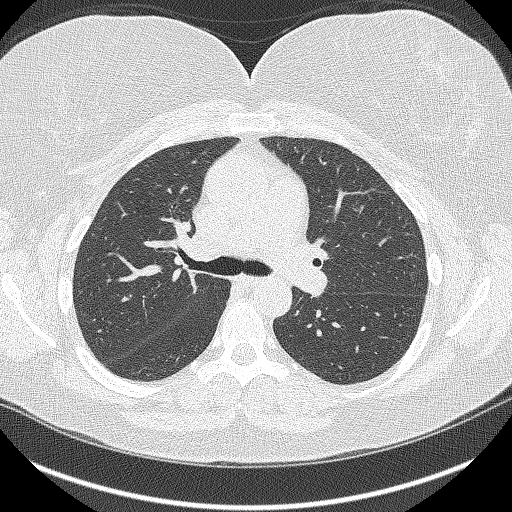
[im 221/325  mediastinal]
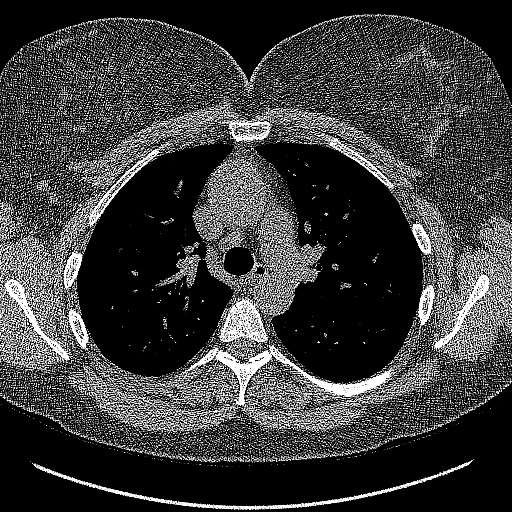
[im 221/325  lung]
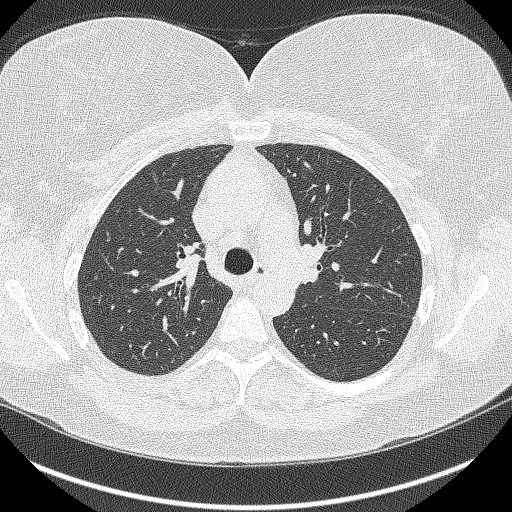
[im 251/325  lung]
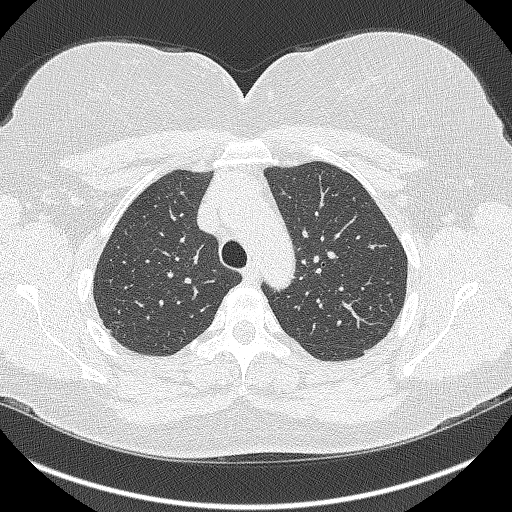
[im 280/325  lung]
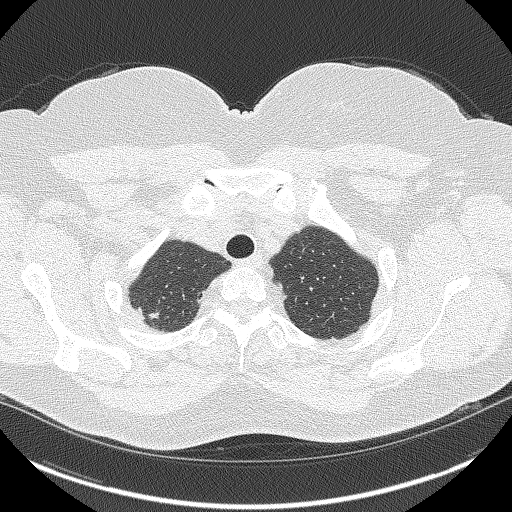
[im 310/325  lung]
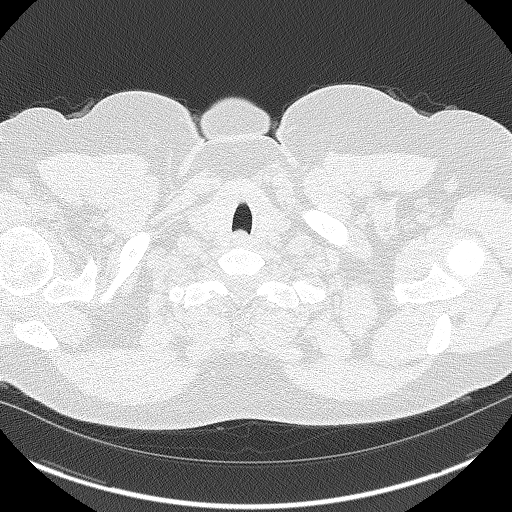

[Series 5: coronals lung 1.00 cor · coronal · 0.64mm/px · 3 of 295 slices shown]
[im 59/295  lung]
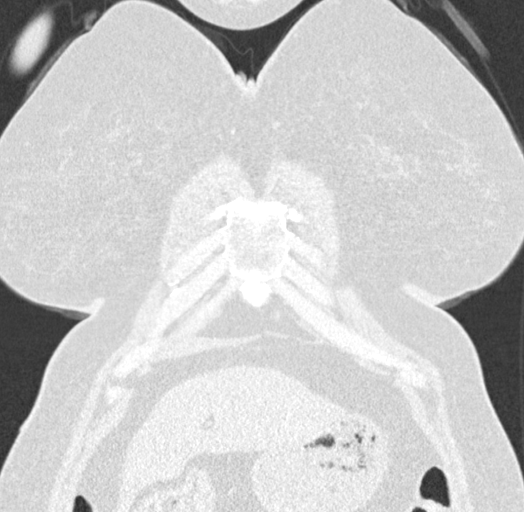
[im 118/295  lung]
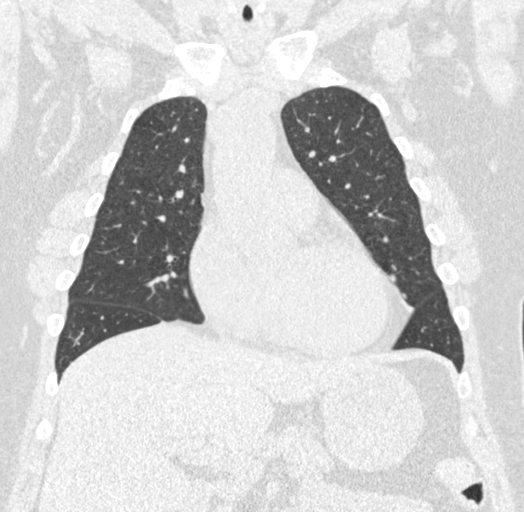
[im 177/295  lung]
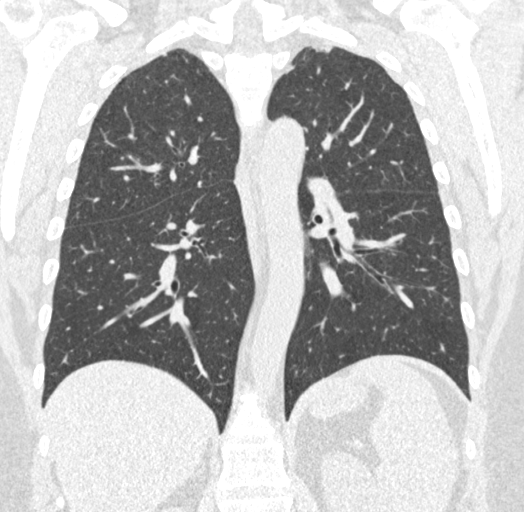

[15 of 40 positions shown; findings below may reference images not displayed]

FINDINGS: Cardiovascular: Heart size is normal.  No pericardial effusion.

Mediastinum/Nodes: No enlarged mediastinal, hilar, or axillary lymph
nodes. Thyroid gland, trachea, and esophagus demonstrate no
significant findings.

Lungs/Pleura: Mild paraseptal and centrilobular emphysema. No
pleural effusion or pneumothorax. Scarring noted within the lingula.
Several small lung nodules are identified. The largest nodule is in
the subpleural aspect of the posterior right upper lobe with a mean
derived diameter of 4.8 mm.

Upper Abdomen: No acute abnormality.

Musculoskeletal: No chest wall mass or suspicious bone lesions
identified.
IMPRESSION: Lung-RADS 2, benign appearance or behavior. Continue annual
screening with low-dose chest CT without contrast in 12 months.

Emphysema ([id number].J).

## 2023-10-01 NOTE — Progress Notes (Unsigned)
 Electrophysiology Office Follow up Visit Note:    Date:  10/02/2023   ID:  Victoria Williamson, DOB [birth year], MRN [id number]  PCP:  Eden Emms, NP  [facility] Cardiologist:  Debbe Odea, MD  [facility] Electrophysiologist:  Lanier Prude, MD    Interval History:     Victoria Williamson is a 58 y.o. female who presents for a follow up visit.   She had a catheter ablation for atrial fibrillation July 15, 2020. During that procedure, the pulmonary veins and CTI were ablated. I last saw the patient Dec 27, 2021.  At that appointment, the patient reported SVT that was different than her prior A-fib episodes.  We considered antiarrhythmic drugs in the past but these were going to interact with her Abilify and mirtazapine so were not started.  She was taking metoprolol and was doing well without sustained recurrence of arrhythmia.  She saw Dr. Azucena Cecil in March 2024.  At that appointment she was still taking Eliquis for stroke prophylaxis.  She is doing well. She brought in HR logs from her fit bit showing bursts of tachycardia up to 140-160s bpm. She is symptomatic during these episodes.      Past medical, surgical, social and family history were reviewed.  ROS:   Please see the history of present illness.    All other systems reviewed and are negative.  EKGs/Labs/Other Studies Reviewed:    The following studies were reviewed today:  Fitbit data reviewed today.        Physical Exam:    VS:  BP 111/67 (BP Location: Left Arm, Patient Position: Sitting, Cuff Size: Normal)   Pulse 79   Ht 5\' 4"  (1.626 m)   Wt 232 lb (105.2 kg)   SpO2 96%   BMI 39.82 kg/m     Wt Readings from Last 3 Encounters:  10/02/23 232 lb (105.2 kg)  01/02/23 232 lb 6 oz (105.4 kg)  10/24/22 230 lb 12.8 oz (104.7 kg)     GEN: no distress CARD: RRR, No MRG RESP: No IWOB. CTAB.      ASSESSMENT:    1. PAF (paroxysmal atrial fibrillation) (HCC)   2. SVT  (supraventricular tachycardia) (HCC)   3. Primary hypertension    PLAN:    In order of problems listed above:  #Atrial fibrillation #Atrial flutter #SVT The patient has a history of supraventricular arrhythmias and atrial fibrillation.  She had a prior catheter ablation and did well for some time but then developed SVT.  Originally this was thought to be all secondary to a COVID infection but she is continued having symptoms with rates significantly greater than 100 bpm.  These are also symptomatic.  Treatment strategies have been discussed with the patient including antiarrhythmic drugs, redo catheter ablation and conservative management.  Invasive therapy would include redo catheter ablation of atrial fibrillation and EP study to assess for inducible SVT.  I have discussed the treatment options in detail with the patient and she wishes to proceed with redo catheter ablation which I think is very reasonable.  She will need to hold her metoprolol for 5 days prior to catheter ablation.  Discussed treatment options today for AF, atrial flutter and SVT including antiarrhythmic drug therapy and ablation. Discussed risks, recovery and likelihood of success with each treatment strategy. Risk, benefits, and alternatives to EP study and ablation for afib and SVT were discussed. These risks include but are not limited to stroke, bleeding, vascular damage, tamponade, perforation, damage to  the esophagus, lungs, phrenic nerve and other structures, pulmonary vein stenosis, worsening renal function, coronary vasospasm and death.  Discussed potential need for repeat ablation procedures and antiarrhythmic drugs after an initial ablation. The patient understands these risk and wishes to proceed.  We will therefore proceed with catheter ablation at the next available time.  Carto, ICE, anesthesia are requested for the procedure.  Will also obtain CT PV protocol prior to the procedure to exclude LAA thrombus and further  evaluate atrial anatomy.  #Hypertension At goal today.  Recommend checking blood pressures 1-2 times per week at home and recording the values.  Recommend bringing these recordings to the primary care physician.  #Dilated ascending aorta Continue with annual follow up with general cardiology.     Signed, Steffanie Dunn, MD, [place], [facility] 10/02/2023 1:57 PM    Electrophysiology [facility]

## 2023-10-02 ENCOUNTER — Encounter: Payer: Self-pay | Admitting: Cardiology

## 2023-10-02 ENCOUNTER — Ambulatory Visit: Payer: PRIVATE HEALTH INSURANCE | Attending: Cardiology | Admitting: Cardiology

## 2023-10-02 VITALS — BP 111/67 | HR 79 | Ht 64.0 in | Wt 232.0 lb

## 2023-10-02 DIAGNOSIS — I480 Paroxysmal atrial fibrillation: Secondary | ICD-10-CM | POA: Diagnosis not present

## 2023-10-02 DIAGNOSIS — I10 Essential (primary) hypertension: Secondary | ICD-10-CM | POA: Diagnosis not present

## 2023-10-02 DIAGNOSIS — I471 Supraventricular tachycardia, unspecified: Secondary | ICD-10-CM | POA: Diagnosis not present

## 2023-10-02 NOTE — Patient Instructions (Signed)
 Medication Instructions:  Your physician recommends that you continue on your current medications as directed. Please refer to the Current Medication list given to you today.  *If you need a refill on your cardiac medications before your next appointment, please call your pharmacy*  Lab Work: BMET and CBC - please go to [facility] office or any [facility] location within 30 days of your procedure to have your labs drawn.   Testing/Procedures: Echocardiogram  Your physician has requested that you have an echocardiogram. Echocardiography is a painless test that uses sound waves to create images of your heart. It provides your doctor with information about the size and shape of your heart and how well your heart's chambers and valves are working. This procedure takes approximately one hour. There are no restrictions for this procedure. Please do NOT wear cologne, perfume, aftershave, or lotions (deodorant is allowed). Please arrive 15 minutes prior to your appointment time.  Please note: We ask at that you not bring children with you during ultrasound (echo/ vascular) testing. Due to room size and safety concerns, children are not allowed in the ultrasound rooms during exams. Our front office staff cannot provide observation of children in our lobby area while testing is being conducted. An adult accompanying a patient to their appointment will only be allowed in the ultrasound room at the discretion of the ultrasound technician under special circumstances. We apologize for any inconvenience.  Ablation  Your physician has recommended that you have an ablation. Catheter ablation is a medical procedure used to treat some cardiac arrhythmias (irregular heartbeats). During catheter ablation, a long, thin, flexible tube is put into a blood vessel in your groin (upper thigh), or neck. This tube is called an ablation catheter. It is then guided to your heart through the blood vessel. Radio frequency  waves destroy small areas of heart tissue where abnormal heartbeats may cause an arrhythmia to start. Please see the instruction sheet given to you today.  Follow-Up: At [facility], you and your health needs are our priority.  As part of our continuing mission to provide you with exceptional heart care, we have created designated Provider Care Teams.  These Care Teams include your primary Cardiologist (physician) and Advanced Practice Providers (APPs -  Physician Assistants and Nurse Practitioners) who all work together to provide you with the care you need, when you need it.   Your next appointment:   We will call you to arrange your post-procedure follow up appointments.

## 2023-10-10 ENCOUNTER — Telehealth: Payer: Self-pay | Admitting: Cardiology

## 2023-10-10 NOTE — Telephone Encounter (Signed)
 Called to say that the echo request need to be sent to them, so that patient insurance will cover it at 100%. Fax [phone]. Please advise

## 2023-10-10 NOTE — Telephone Encounter (Signed)
 Called the patient for more information. She stated her insurance needs her to have the echo through [facility] and they will not pay for it to be done through [facility].

## 2023-10-11 ENCOUNTER — Telehealth: Payer: Self-pay | Admitting: Cardiology

## 2023-10-11 NOTE — Telephone Encounter (Signed)
 Signed orders have been refaxed.

## 2023-10-11 NOTE — Telephone Encounter (Signed)
 Orders have been faxed

## 2023-10-11 NOTE — Telephone Encounter (Signed)
 [facility] called because they needed a signature for a echo to be done

## 2023-10-17 ENCOUNTER — Telehealth: Payer: Self-pay | Admitting: Cardiology

## 2023-10-17 NOTE — Telephone Encounter (Signed)
 Patient reports that she is having trouble with her insurance approving her ablation. She states that she is still willing to go through with the ablation if they will pay for part of it. She just needs to know how much she will need to pay out of pocket. She is going to reach out to her insurance company. Advised that I would send a message to our pre-cert team and billing department.

## 2023-10-17 NOTE — Telephone Encounter (Signed)
 Pt needs to talk to nurse about her ablation

## 2023-10-18 NOTE — Telephone Encounter (Signed)
 Pt called in stating she got an auth number from her insurance company but was not sure if its for Echo or ablation. Please advise.   Berkley Harvey [id number] exp 01/15/24

## 2023-10-29 ENCOUNTER — Ambulatory Visit: Payer: PRIVATE HEALTH INSURANCE | Attending: Cardiology

## 2023-10-29 ENCOUNTER — Telehealth: Payer: Self-pay | Admitting: Cardiology

## 2023-10-29 DIAGNOSIS — I480 Paroxysmal atrial fibrillation: Secondary | ICD-10-CM | POA: Diagnosis not present

## 2023-10-29 DIAGNOSIS — I471 Supraventricular tachycardia, unspecified: Secondary | ICD-10-CM | POA: Diagnosis not present

## 2023-10-29 DIAGNOSIS — I10 Essential (primary) hypertension: Secondary | ICD-10-CM

## 2023-10-29 DIAGNOSIS — Z0279 Encounter for issue of other medical certificate: Secondary | ICD-10-CM

## 2023-10-29 LAB — ECHOCARDIOGRAM COMPLETE
AR max vel: 2.7 cm2
AV Area VTI: 2.76 cm2
AV Area mean vel: 2.7 cm2
AV Mean grad: 5 mmHg
AV Peak grad: 9.1 mmHg
Ao pk vel: 1.51 m/s
Area-P 1/2: 3.99 cm2
S' Lateral: 2.4 cm

## 2023-10-29 NOTE — Telephone Encounter (Signed)
 Patient filled out FMLA paperwork, paid $29 & placed in nurse box.

## 2023-10-30 NOTE — Telephone Encounter (Signed)
 Forms have been completed and signed and given back to front office staff.

## 2023-10-31 NOTE — Telephone Encounter (Signed)
 Called patient to pick up paperwork at front desk

## 2023-11-01 ENCOUNTER — Encounter: Payer: Self-pay | Admitting: Physician Assistant

## 2023-11-02 ENCOUNTER — Encounter: Payer: Self-pay | Admitting: Cardiology

## 2023-11-05 ENCOUNTER — Ambulatory Visit: Payer: PRIVATE HEALTH INSURANCE | Admitting: Cardiology

## 2023-11-08 ENCOUNTER — Telehealth (HOSPITAL_COMMUNITY): Payer: Self-pay

## 2023-11-08 NOTE — Telephone Encounter (Signed)
 Spoke with patient to complete pre-procedure call.     New medical conditions?  No Recent hospitalizations or surgeries? No Started any new medications? No Patient made aware to contact office to inform of any new medications started. Any changes in activities of daily living? No  Pre-procedure testing scheduled: lab work on 11/11/23 Confirmed patient is taking Eliquis and will continue taking medication before procedure or it may need to be rescheduled.  Confirmed patient is scheduled for Atrial Fibrillation Ablation on Friday, April 25 with Dr. Steffanie Dunn. Instructed patient to arrive at the Main Entrance A at [facility]: [address] [place], [place] [address] and check in at Admitting at 10:30 AM  Advised of plan to go home the same day and will only stay overnight if medically necessary. You MUST have a responsible adult to drive you home and MUST be with you the first 24 hours after you arrive home or your procedure could be cancelled.  Patient verbalized understanding to information provided and is agreeable to proceed with procedure.

## 2023-11-11 ENCOUNTER — Encounter: Payer: Self-pay | Admitting: Cardiology

## 2023-11-11 ENCOUNTER — Ambulatory Visit: Payer: PRIVATE HEALTH INSURANCE | Attending: Cardiology | Admitting: Cardiology

## 2023-11-11 ENCOUNTER — Other Ambulatory Visit: Payer: Self-pay

## 2023-11-11 VITALS — BP 124/72 | HR 67 | Ht 64.0 in | Wt 229.4 lb

## 2023-11-11 DIAGNOSIS — I10 Essential (primary) hypertension: Secondary | ICD-10-CM

## 2023-11-11 DIAGNOSIS — I471 Supraventricular tachycardia, unspecified: Secondary | ICD-10-CM | POA: Diagnosis not present

## 2023-11-11 DIAGNOSIS — Z6839 Body mass index (BMI) 39.0-39.9, adult: Secondary | ICD-10-CM

## 2023-11-11 DIAGNOSIS — I480 Paroxysmal atrial fibrillation: Secondary | ICD-10-CM | POA: Diagnosis not present

## 2023-11-11 DIAGNOSIS — I77810 Thoracic aortic ectasia: Secondary | ICD-10-CM

## 2023-11-11 NOTE — Progress Notes (Signed)
 Cardiology Office Note:    Date:  11/11/2023   ID:  Victoria Williamson, DOB 1966/05/27, MRN [id number]  PCP:  Eden Emms, NP  [facility] Cardiologist:  Debbe Odea, MD  [facility] Electrophysiologist:  Lanier Prude, MD   Referring MD: Eden Emms, NP   No chief complaint on file.    History of Present Illness:    Victoria Williamson is a 58 y.o. female with a hx of hypertension, paroxysmal atrial fibrillation s/p ablation 07/2020, SVT, obesity who presents for follow-up.   Has palpitations over the past several months, initially happening once daily, now occurring about twice daily lasting a few minutes, sometimes associated with dizziness.  Evaluated by EP, ablation being planned later this month.  She is compliant with Toprol-XL, Eliquis, no bleeding issues.  Feels a little nauseous today, took Claritin and Protonix on an empty stomach.  Otherwise no other concerns at this time.   Prior notes Echo 07/2021 EF 60 to 65%, borderline ascending aorta dilatation 38 mm.  Patient was seen in the emergency room 2 months ago due to palpitations and weakness.  EKG on 03/17/2020 while in the ED showed SVT, heart rate 147.    Review of EMR showed patient had a PET myocardial perfusion stress test 01/2020 with no evidence of ischemia.   Echocardiogram on 12/2019 showed normal systolic function, normal wall thickness, EF 55%, grade 2 diastolic dysfunction.   Had a left heart cath in 2014 and was told she had normal coronary arteries.  Patient states having symptoms of weakness/fatigue, shortness of breath whenever she goes into atrial fibrillation.  Initially diagnosed with atrial fibrillation in February 2021.    Past Medical History:  Diagnosis Date   Anticoagulant long-term use    eliquis--- managed by cardiology   Arthritis    Ascending aortic aneurysm (HCC) 05/2022   had surgical consult 05-21-2022 w/ wayne gold PA ,   4.0 cm per CT 05-21-2022 in epic, active  survillance   Chronic low back pain    DDD (degenerative disc disease), lumbar    Diastolic CHF, chronic (HCC)    followed by cardiology;    last echo 07-27-2021 ef 60-65%   DOE (dyspnea on exertion)    Dysautonomia (HCC) 12/18/2013   dx by cardiologist--- dr Graciela Husbands   [phone]  pt stated no issues in passed few yrs)   Edema of both lower extremities    intermittant per pt   Fatty liver    GAD (generalized anxiety disorder)    History of adenomatous polyp of colon    Hx of skin cancer, basal cell    Hypertension    MDD (major depressive disorder)    PAF (paroxysmal atrial fibrillation) (HCC) 09/2019   cardiologist-  dr b. agber-etang/  EP cardiologist-- dr Lalla Brothers   PMB (postmenopausal bleeding)    PONV (postoperative nausea and vomiting)    Pre-diabetes    PSVT (paroxysmal supraventricular tachycardia) (HCC)    Pulmonary emphysema (HCC)    followed by pcp   Pulmonary nodule    followed by pcp   Spondylolisthesis of lumbar region     Past Surgical History:  Procedure Laterality Date   ATRIAL FIBRILLATION ABLATION N/A 07/15/2020   Procedure: ATRIAL FIBRILLATION ABLATION;  Surgeon: Lanier Prude, MD;  Location: [facility];  Service: Cardiovascular;  Laterality: N/A;   BREAST BIOPSY Right 07/17/2023   MM RT BREAST BX W LOC DEV 1ST LESION IMAGE BX SPEC STEREO GUIDE  07/17/2023 [facility]   CARDIAC CATHETERIZATION  06/03/2013   @[facility]  by dr Lady Gary;  per pt was told normal coronary arteries   CARDIOVERSION N/A 05/11/2020   Procedure: CARDIOVERSION;  Surgeon: Debbe Odea, MD;  Location: [facility];  Service: Cardiovascular;  Laterality: N/A;   CARPAL TUNNEL RELEASE Left 06/23/2020   Procedure: CARPAL TUNNEL RELEASE;  Surgeon: Cindee Salt, MD;  Location: [facility];  Service: Orthopedics;  Laterality: Left;  IV REGIONAL FOREARM BLOCK   CARPAL TUNNEL RELEASE Right 2000   COLONOSCOPY  2020   HYSTEROSCOPY W/ ENDOMETRIAL ABLATION  02/23/2005    @[facility] ;  w/ novasure   HYSTEROSCOPY WITH D & C N/A 07/24/2022   Procedure: ATTEMPTED DILATATION AND CURETTAGE /HYSTEROSCOPY;  Surgeon: Jerene Bears, MD;  Location: [facility];  Service: Gynecology;  Laterality: N/A;   OPERATIVE ULTRASOUND N/A 07/24/2022   Procedure: OPERATIVE ULTRASOUND;  Surgeon: Jerene Bears, MD;  Location: [facility];  Service: Gynecology;  Laterality: N/A;   REVISION AMPUTATION OF FINGER Left 12/17/2000   @[facility]  by dr sypher;  injury  left little finger   TUBAL LIGATION Bilateral    yrs ago    Current Medications: Current Meds  Medication Sig   amitriptyline (ELAVIL) 50 MG tablet Take 50 mg by mouth at bedtime.   apixaban (ELIQUIS) 5 MG TABS tablet TAKE 1 TABLET BY MOUTH TWICE A DAY   ARIPiprazole (ABILIFY) 10 MG tablet TAKE 1/2 TABLET BY MOUTH DAILY   Cholecalciferol ([employer] VIT D 5000 HIGH-POTENCY PO) Take by mouth every other day.   loratadine (CLARITIN) 10 MG tablet Take 10 mg by mouth daily as needed.   metoprolol succinate (TOPROL-XL) 50 MG 24 hr tablet Take 1.5 tablets (75 mg) by mouth twice daily. Take with or immediately following a meal.   pantoprazole (PROTONIX) 40 MG tablet TAKE 1 TABLET BY MOUTH EVERY DAY     Allergies:   Codeine, Meloxicam, and Paroxetine hcl   Social History   Socioeconomic History   Marital status: Married    Spouse name: Molly Maduro   Number of children: 2   Years of education: Not on file   Highest education level: Not on file  Occupational History   Not on file  Tobacco Use   Smoking status: Former    Current packs/day: 0.00    Average packs/day: 1 pack/day for 37.0 years (37.0 ttl pk-yrs)    Types: Cigarettes    Start date: 12/29/1982    Quit date: 12/29/2019    Years since quitting: 3.8   Smokeless tobacco: Never  Vaping Use   Vaping status: Every Day   Substances: Nicotine   Devices: juul  Substance and Sexual Activity   Alcohol use: Not Currently   Drug use: Never   Sexual activity: Yes     Partners: Male    Birth control/protection: Post-menopausal  Other Topics Concern   Not on file  Social History Narrative   Lives at home with husband      Fulltime: Designer, industrial/product   Social Drivers of Health   Financial Resource Strain: Not on file  Food Insecurity: Not on file  Transportation Needs: Not on file  Physical Activity: Not on file  Stress: Not on file  Social Connections: Unknown (12/19/2021)   Received [facility]   Social Network    Social Network: Not on file     Family History: The patient's family history includes Alcohol abuse in her father; Arthritis  in her mother; Breast cancer (age of onset: 62) in her maternal aunt; Breast cancer (age of onset: 33) in her maternal grandmother; COPD in her mother; Cancer (age of onset: 83) in her father; Depression in her paternal grandmother; Diabetes in her mother and another family member; Hearing loss in her father; Heart attack in her father and paternal grandfather; Heart attack (age of onset: 60) in her brother; Heart disease in her father, mother, and paternal grandfather; High Cholesterol in her father and mother; Hypertension in her father and mother; Kidney disease in her mother.  ROS:   Please see the history of present illness.     All other systems reviewed and are negative.  EKGs/Labs/Other Studies Reviewed:    The following studies were reviewed today:   EKG Interpretation Date/Time:  Monday November 11 2023 10:09:07 EDT Ventricular Rate:  67 PR Interval:  166 QRS Duration:  82 QT Interval:  404 QTC Calculation: 426 R Axis:   24  Text Interpretation: Normal sinus rhythm Normal ECG Confirmed by Debbe Odea ([id number]) on 11/11/2023 10:28:06 AM    Recent Labs: 01/02/2023: ALT 43; BUN 8; Creatinine, Ser 0.99; Hemoglobin 12.8; Platelets 206.0; Potassium 3.6; Sodium 139; TSH 2.40  Recent Lipid Panel    Component Value Date/Time   CHOL 135 01/02/2023 1633   TRIG 161.0 (H) 01/02/2023  1633   HDL 34.00 (L) 01/02/2023 1633   CHOLHDL 4 01/02/2023 1633   VLDL 32.2 01/02/2023 1633   LDLCALC 68 01/02/2023 1633   LDLCALC 97 10/20/2021 1529    Physical Exam:    VS:  BP 124/72   Pulse 67   Ht 5\' 4"  (1.626 m)   Wt 229 lb 6.4 oz (104.1 kg)   SpO2 97%   BMI 39.38 kg/m     Wt Readings from Last 3 Encounters:  11/11/23 229 lb 6.4 oz (104.1 kg)  10/02/23 232 lb (105.2 kg)  01/02/23 232 lb 6 oz (105.4 kg)     GEN:  Well nourished, well developed in no acute distress HEENT: Normal NECK: No JVD; No carotid bruits CARDIAC: Regular rate and rhythm, no murmurs RESPIRATORY:  Clear to auscultation without rales, wheezing or rhonchi  ABDOMEN: Soft, non-tender, non-distended MUSCULOSKELETAL:  No edema; No deformity  SKIN: Warm and dry NEUROLOGIC:  Alert and oriented x 3 PSYCHIATRIC:  Normal affect   ASSESSMENT:    1. PAF (paroxysmal atrial fibrillation) (HCC)   2. SVT (supraventricular tachycardia) (HCC)   3. Primary hypertension   4. BMI 39.0-39.9,adult   5. Mild ascending aorta dilatation (HCC)    PLAN:    In order of problems listed above:  paroxysmal atrial fibrillation, s/p ablation 07/2020 currently in sinus rhythm.  Occasional palpitations, possibly SVT.  Repeat ablation being planned.  CHA2DS2-VASc of 2. continue Toprol-XL 75 mg twice daily, Eliquis 5 mg twice daily.  Continue torsemide as needed torsemide. Paroxysmal SVT, palpitations.  Ablation being planned by EP.  Appreciate input from EP. hypertension, controlled. continue Toprol-XL, losartan 50 mg daily. Obesity, low-calorie diet, weight loss advised.  Ectatic/mild ascending aorta dilatation.  CT chest 07/2020 ascending aorta measurement 40 mm, 38 mm, echo 07/2021.  Monitor with serial echoes, plan repeat in approximately 1 year 2020 01/2026.  Follow-up in 6-12 months  Medication Adjustments/Labs and Tests Ordered: Current medicines are reviewed at length with the patient today.  Concerns regarding  medicines are outlined above.  Orders Placed This Encounter  Procedures   EKG 12-Lead    No orders of the  defined types were placed in this encounter.    Patient Instructions  Medication Instructions:   Your Physician recommends you continue on your current medication as directed.     *If you need a refill on your cardiac medications before your next appointment, please call your pharmacy*  Lab Work:  No labs ordered today   Testing/Procedures:  No test ordered today   Follow-Up: At [facility], you and your health needs are our priority.  As part of our continuing mission to provide you with exceptional heart care, our providers are all part of one team.  This team includes your primary Cardiologist (physician) and Advanced Practice Providers or APPs (Physician Assistants and Nurse Practitioners) who all work together to provide you with the care you need, when you need it.  Your next appointment:   1 year(s)  Provider:   You may see Debbe Odea, MD or one of the following Advanced Practice Providers on your designated Care Team:   Nicolasa Ducking, NP Ames Dura, PA-C Eula Listen, PA-C Cadence [place], PA-C Charlsie Quest, NP Carlos Levering, NP    We recommend signing up for the patient portal called "MyChart".  Sign up information is provided on this After Visit Summary.  MyChart is used to connect with patients for Virtual Visits (Telemedicine).  Patients are able to view lab/test results, encounter notes, upcoming appointments, etc.  Non-urgent messages can be sent to your provider as well.   To learn more about what you can do with MyChart, go to [email or web address].        Signed, Debbe Odea, MD  11/11/2023 11:27 AM    [facility]

## 2023-11-11 NOTE — Patient Instructions (Signed)
 Medication Instructions:   Your Physician recommends you continue on your current medication as directed.     *If you need a refill on your cardiac medications before your next appointment, please call your pharmacy*  Lab Work:  No labs ordered today   Testing/Procedures:  No test ordered today   Follow-Up: At [facility], you and your health needs are our priority.  As part of our continuing mission to provide you with exceptional heart care, our providers are all part of one team.  This team includes your primary Cardiologist (physician) and Advanced Practice Providers or APPs (Physician Assistants and Nurse Practitioners) who all work together to provide you with the care you need, when you need it.  Your next appointment:   1 year(s)  Provider:   You may see Debbe Odea, MD or one of the following Advanced Practice Providers on your designated Care Team:   Nicolasa Ducking, NP Ames Dura, PA-C Eula Listen, PA-C Cadence [place], PA-C Charlsie Quest, NP Carlos Levering, NP    We recommend signing up for the patient portal called "MyChart".  Sign up information is provided on this After Visit Summary.  MyChart is used to connect with patients for Virtual Visits (Telemedicine).  Patients are able to view lab/test results, encounter notes, upcoming appointments, etc.  Non-urgent messages can be sent to your provider as well.   To learn more about what you can do with MyChart, go to [email or web address].

## 2023-11-12 LAB — BASIC METABOLIC PANEL WITH GFR
BUN/Creatinine Ratio: 11 (ref 9–23)
BUN: 11 mg/dL (ref 6–24)
CO2: 22 mmol/L (ref 20–29)
Calcium: 9.6 mg/dL (ref 8.7–10.2)
Chloride: 104 mmol/L (ref 96–106)
Creatinine, Ser: 1.01 mg/dL — ABNORMAL HIGH (ref 0.57–1.00)
Glucose: 84 mg/dL (ref 70–99)
Potassium: 4.6 mmol/L (ref 3.5–5.2)
Sodium: 141 mmol/L (ref 134–144)
eGFR: 65 mL/min/{1.73_m2} (ref >59)

## 2023-11-12 LAB — CBC
Hematocrit: 40.9 % (ref 34.0–46.6)
Hemoglobin: 13.4 g/dL (ref 11.1–15.9)
MCH: 30.3 pg (ref 26.6–33.0)
MCHC: 32.8 g/dL (ref 31.5–35.7)
MCV: 93 fL (ref 79–97)
Platelets: 240 10*3/uL (ref 150–450)
RBC: 4.42 x10E6/uL (ref 3.77–5.28)
RDW: 12.6 % (ref 11.7–15.4)
WBC: 8.4 10*3/uL (ref 3.4–10.8)

## 2023-11-19 ENCOUNTER — Encounter: Payer: Self-pay | Admitting: Cardiology

## 2023-11-20 ENCOUNTER — Telehealth: Payer: Self-pay | Admitting: Cardiology

## 2023-11-20 NOTE — Telephone Encounter (Signed)
 Pt requesting cb to discuss being taken out of work until her ablation next Friday w/ Marven Slimmer due to continued HR issues

## 2023-11-21 ENCOUNTER — Other Ambulatory Visit: Payer: Self-pay

## 2023-11-21 ENCOUNTER — Encounter: Payer: Self-pay | Admitting: Cardiology

## 2023-11-21 ENCOUNTER — Ambulatory Visit: Payer: PRIVATE HEALTH INSURANCE | Attending: Cardiology | Admitting: Cardiology

## 2023-11-21 ENCOUNTER — Observation Stay
Admission: EM | Admit: 2023-11-21 | Discharge: 2023-11-22 | Disposition: A | Discharge status: Home / Self Care | Payer: PRIVATE HEALTH INSURANCE | Attending: Internal Medicine | Admitting: Internal Medicine

## 2023-11-21 ENCOUNTER — Emergency Department: Payer: PRIVATE HEALTH INSURANCE

## 2023-11-21 VITALS — BP 120/80 | HR 150 | Ht 64.0 in | Wt 230.1 lb

## 2023-11-21 DIAGNOSIS — I110 Hypertensive heart disease with heart failure: Secondary | ICD-10-CM | POA: Diagnosis not present

## 2023-11-21 DIAGNOSIS — I4892 Unspecified atrial flutter: Secondary | ICD-10-CM | POA: Diagnosis not present

## 2023-11-21 DIAGNOSIS — R002 Palpitations: Secondary | ICD-10-CM

## 2023-11-21 DIAGNOSIS — Z79899 Other long term (current) drug therapy: Secondary | ICD-10-CM | POA: Diagnosis not present

## 2023-11-21 DIAGNOSIS — Z6839 Body mass index (BMI) 39.0-39.9, adult: Secondary | ICD-10-CM

## 2023-11-21 DIAGNOSIS — I5030 Unspecified diastolic (congestive) heart failure: Secondary | ICD-10-CM | POA: Diagnosis not present

## 2023-11-21 DIAGNOSIS — R0602 Shortness of breath: Secondary | ICD-10-CM | POA: Diagnosis present

## 2023-11-21 DIAGNOSIS — I480 Paroxysmal atrial fibrillation: Secondary | ICD-10-CM

## 2023-11-21 DIAGNOSIS — I471 Supraventricular tachycardia, unspecified: Secondary | ICD-10-CM

## 2023-11-21 DIAGNOSIS — Z85828 Personal history of other malignant neoplasm of skin: Secondary | ICD-10-CM | POA: Diagnosis not present

## 2023-11-21 DIAGNOSIS — F32A Depression, unspecified: Secondary | ICD-10-CM | POA: Diagnosis not present

## 2023-11-21 DIAGNOSIS — Z7901 Long term (current) use of anticoagulants: Secondary | ICD-10-CM | POA: Insufficient documentation

## 2023-11-21 DIAGNOSIS — I5032 Chronic diastolic (congestive) heart failure: Secondary | ICD-10-CM | POA: Diagnosis not present

## 2023-11-21 DIAGNOSIS — F419 Anxiety disorder, unspecified: Secondary | ICD-10-CM | POA: Diagnosis not present

## 2023-11-21 DIAGNOSIS — I77810 Thoracic aortic ectasia: Secondary | ICD-10-CM

## 2023-11-21 DIAGNOSIS — I4891 Unspecified atrial fibrillation: Secondary | ICD-10-CM | POA: Diagnosis not present

## 2023-11-21 DIAGNOSIS — J449 Chronic obstructive pulmonary disease, unspecified: Secondary | ICD-10-CM | POA: Insufficient documentation

## 2023-11-21 DIAGNOSIS — Z87891 Personal history of nicotine dependence: Secondary | ICD-10-CM | POA: Insufficient documentation

## 2023-11-21 DIAGNOSIS — I10 Essential (primary) hypertension: Secondary | ICD-10-CM

## 2023-11-21 DIAGNOSIS — Z9889 Other specified postprocedural states: Secondary | ICD-10-CM | POA: Diagnosis not present

## 2023-11-21 LAB — COMPREHENSIVE METABOLIC PANEL WITH GFR
ALT: 49 U/L — ABNORMAL HIGH (ref 0–44)
AST: 39 U/L (ref 15–41)
Albumin: 4.2 g/dL (ref 3.5–5.0)
Alkaline Phosphatase: 81 U/L (ref 38–126)
Anion gap: 9 (ref 5–15)
BUN: 8 mg/dL (ref 6–20)
CO2: 26 mmol/L (ref 22–32)
Calcium: 9.4 mg/dL (ref 8.9–10.3)
Chloride: 104 mmol/L (ref 98–111)
Creatinine, Ser: 0.86 mg/dL (ref 0.44–1.00)
GFR, Estimated: >60 mL/min (ref >60)
Glucose, Bld: 100 mg/dL — ABNORMAL HIGH (ref 70–99)
Potassium: 3.9 mmol/L (ref 3.5–5.1)
Sodium: 139 mmol/L (ref 135–145)
Total Bilirubin: 0.6 mg/dL (ref 0.0–1.2)
Total Protein: 8.1 g/dL (ref 6.5–8.1)

## 2023-11-21 LAB — CBC
HCT: 45.5 % (ref 36.0–46.0)
Hemoglobin: 15 g/dL (ref 12.0–15.0)
MCH: 30.4 pg (ref 26.0–34.0)
MCHC: 33 g/dL (ref 30.0–36.0)
MCV: 92.3 fL (ref 80.0–100.0)
Platelets: 230 10*3/uL (ref 150–400)
RBC: 4.93 MIL/uL (ref 3.87–5.11)
RDW: 12.5 % (ref 11.5–15.5)
WBC: 9.6 10*3/uL (ref 4.0–10.5)
nRBC: 0 % (ref 0.0–0.2)

## 2023-11-21 LAB — TSH: TSH: 3.173 u[IU]/mL (ref 0.350–4.500)

## 2023-11-21 LAB — TROPONIN I (HIGH SENSITIVITY): Troponin I (High Sensitivity): 10 ng/L (ref <18)

## 2023-11-21 LAB — PROTIME-INR
INR: 1.3 — ABNORMAL HIGH (ref 0.8–1.2)
Prothrombin Time: 15.9 s — ABNORMAL HIGH (ref 11.4–15.2)

## 2023-11-21 LAB — MAGNESIUM: Magnesium: 2.1 mg/dL (ref 1.7–2.4)

## 2023-11-21 LAB — APTT: aPTT: 39 s — ABNORMAL HIGH (ref 24–36)

## 2023-11-21 LAB — T4, FREE: Free T4: 0.81 ng/dL (ref 0.61–1.12)

## 2023-11-21 MED ORDER — DILTIAZEM HCL 25 MG/5ML IV SOLN
10.0000 mg | Freq: Once | INTRAVENOUS | Status: AC
  Administered 2023-11-21: 10 mg via INTRAVENOUS
  Filled 2023-11-21: qty 5

## 2023-11-21 MED ORDER — FLUTICASONE PROPIONATE 50 MCG/ACT NA SUSP
1.0000 | Freq: Every day | NASAL | Status: DC
  Filled 2023-11-21: qty 16

## 2023-11-21 MED ORDER — AMITRIPTYLINE HCL 25 MG PO TABS: 25.0000 mg | ORAL_TABLET | Freq: Every day | ORAL | Status: DC

## 2023-11-21 MED ORDER — ARIPIPRAZOLE 5 MG PO TABS
5.0000 mg | ORAL_TABLET | Freq: Every day | ORAL | Status: DC
  Administered 2023-11-22: 5 mg via ORAL
  Filled 2023-11-21: qty 1

## 2023-11-21 MED ORDER — AMIODARONE LOAD VIA INFUSION: 150.0000 mg | Freq: Once | INTRAVENOUS | Status: DC

## 2023-11-21 MED ORDER — AMIODARONE HCL IN DEXTROSE 360-4.14 MG/200ML-% IV SOLN: 60.0000 mg/h | INTRAVENOUS | Status: DC

## 2023-11-21 MED ORDER — LORATADINE 10 MG PO TABS: 10.0000 mg | ORAL_TABLET | Freq: Every day | ORAL | Status: DC | PRN

## 2023-11-21 MED ORDER — AMITRIPTYLINE HCL 50 MG PO TABS
50.0000 mg | ORAL_TABLET | Freq: Every day | ORAL | Status: DC
  Administered 2023-11-21: 25 mg via ORAL
  Filled 2023-11-21: qty 1

## 2023-11-21 MED ORDER — METOPROLOL TARTRATE 50 MG PO TABS: 75.0000 mg | ORAL_TABLET | Freq: Two times a day (BID) | ORAL | Status: DC

## 2023-11-21 MED ORDER — AMIODARONE HCL IN DEXTROSE 360-4.14 MG/200ML-% IV SOLN: 30.0000 mg/h | INTRAVENOUS | Status: DC

## 2023-11-21 MED ORDER — DILTIAZEM HCL 25 MG/5ML IV SOLN
15.0000 mg | Freq: Once | INTRAVENOUS | Status: AC
  Administered 2023-11-21: 15 mg via INTRAVENOUS

## 2023-11-21 MED ORDER — ADENOSINE 6 MG/2ML IV SOLN
12.0000 mg | Freq: Once | INTRAVENOUS | Status: DC
Start: 2023-11-21 — End: 2023-11-21
  Administered 2023-11-21: 12 mg via INTRAVENOUS

## 2023-11-21 MED ORDER — METOPROLOL SUCCINATE ER 100 MG PO TB24
100.0000 mg | ORAL_TABLET | Freq: Two times a day (BID) | ORAL | Status: DC
  Administered 2023-11-21 – 2023-11-22 (×2): 100 mg via ORAL
  Filled 2023-11-21: qty 1
  Filled 2023-11-21: qty 2

## 2023-11-21 MED ORDER — ONDANSETRON HCL 4 MG PO TABS: 4.0000 mg | ORAL_TABLET | Freq: Four times a day (QID) | ORAL | Status: DC | PRN

## 2023-11-21 MED ORDER — APIXABAN 5 MG PO TABS
5.0000 mg | ORAL_TABLET | Freq: Two times a day (BID) | ORAL | Status: DC
Start: 2023-11-21 — End: 2023-11-22
  Administered 2023-11-21 – 2023-11-22 (×2): 5 mg via ORAL
  Filled 2023-11-21 (×2): qty 1

## 2023-11-21 MED ORDER — DILTIAZEM HCL-DEXTROSE 125-5 MG/125ML-% IV SOLN (PREMIX)
5.0000 mg/h | INTRAVENOUS | Status: DC
  Administered 2023-11-21: 5 mg/h via INTRAVENOUS
  Filled 2023-11-21: qty 125

## 2023-11-21 MED ORDER — ADENOSINE 6 MG/2ML IV SOLN
6.0000 mg | Freq: Once | INTRAVENOUS | Status: DC
  Administered 2023-11-21: 6 mg via INTRAVENOUS

## 2023-11-21 MED ORDER — ONDANSETRON HCL 4 MG/2ML IJ SOLN: 4.0000 mg | Freq: Four times a day (QID) | INTRAMUSCULAR | Status: DC | PRN

## 2023-11-21 MED ORDER — PANTOPRAZOLE SODIUM 40 MG PO TBEC
40.0000 mg | DELAYED_RELEASE_TABLET | Freq: Every day | ORAL | Status: DC
  Administered 2023-11-22: 40 mg via ORAL
  Filled 2023-11-21: qty 1

## 2023-11-21 MED ORDER — IPRATROPIUM BROMIDE 0.02 % IN SOLN: 0.5000 mg | Freq: Four times a day (QID) | RESPIRATORY_TRACT | Status: DC | PRN

## 2023-11-21 NOTE — Consult Note (Signed)
 Cardiology Consultation   Patient ID: Victoria Williamson MRN: [id number]; DOB: 1965-10-26  Admit date: 11/21/2023 Date of Consult: 11/21/2023  PCP:  Selinda Flavin, MD   [facility] Providers Cardiologist:  Debbe Odea, MD  Physician requesting consult: Dr.Funke Reason for consult: Tachycardia  Patient Profile:   Victoria Williamson is a 58 y.o. female with a hx of hypertension, paroxysmal atrial fibrillation history of ablation December 2021, SVT, obesity presenting to the ER via the cardiology clinic with tachycardia  History of Present Illness:   Victoria Williamson has been seen in cardiology clinic, evaluated by EP 1 month ago with plan for ablation next week Flecainide held secondary to interaction with her other medications/Abilify She presented to cardiology clinic with reports of palpitations, fatigue and shortness of breath over the past week Reports compliance with her Eliquis EKG in the office concerning for narrow complex tachycardia, possible SVT rate 150 up to 160 bpm Heart rate up to 180 with showering and bathing at home In the cardiology office was given adenosine 6, repeat of 12, rhythm broke for several seconds appeared to be underlying normal sinus then back to narrow complex tachycardia rate 150  She was sent to the emergency room, started on diltiazem several boluses with infusion up to 15 mg/h Rate down to 120 bpm EKG suggestive of atrial tachycardia rate 124 bpm  Past Medical History:  Diagnosis Date   Anticoagulant long-term use    eliquis--- managed by cardiology   Arthritis    Ascending aortic aneurysm (HCC) 05/2022   had surgical consult 05-21-2022 w/ wayne gold PA ,   4.0 cm per CT 05-21-2022 in epic, active survillance   Chronic low back pain    DDD (degenerative disc disease), lumbar    Diastolic CHF, chronic (HCC)    followed by cardiology;    last echo 07-27-2021 ef 60-65%   DOE (dyspnea on exertion)    Dysautonomia (HCC)  12/18/2013   dx by cardiologist--- dr Graciela Husbands   [phone]  pt stated no issues in passed few yrs)   Edema of both lower extremities    intermittant per pt   Fatty liver    GAD (generalized anxiety disorder)    History of adenomatous polyp of colon    Hx of skin cancer, basal cell    Hypertension    MDD (major depressive disorder)    PAF (paroxysmal atrial fibrillation) (HCC) 09/2019   cardiologist-  dr b. agber-etang/  EP cardiologist-- dr Lalla Brothers   PMB (postmenopausal bleeding)    PONV (postoperative nausea and vomiting)    Pre-diabetes    PSVT (paroxysmal supraventricular tachycardia) (HCC)    Pulmonary emphysema (HCC)    followed by pcp   Pulmonary nodule    followed by pcp   Spondylolisthesis of lumbar region     Past Surgical History:  Procedure Laterality Date   ATRIAL FIBRILLATION ABLATION N/A 07/15/2020   Procedure: ATRIAL FIBRILLATION ABLATION;  Surgeon: Lanier Prude, MD;  Location: [facility] INVASIVE CV LAB;  Service: Cardiovascular;  Laterality: N/A;   BREAST BIOPSY Right 07/17/2023   MM RT BREAST BX W LOC DEV 1ST LESION IMAGE BX SPEC STEREO GUIDE 07/17/2023 [facility]   CARDIAC CATHETERIZATION  06/03/2013   @[facility]  by dr Lady Gary;  per pt was told normal coronary arteries   CARDIOVERSION N/A 05/11/2020   Procedure: CARDIOVERSION;  Surgeon: Debbe Odea, MD;  Location: [facility] ORS;  Service: Cardiovascular;  Laterality: N/A;   CARPAL TUNNEL RELEASE Left 06/23/2020  Procedure: CARPAL TUNNEL RELEASE;  Surgeon: Cindee Salt, MD;  Location: [facility];  Service: Orthopedics;  Laterality: Left;  IV REGIONAL FOREARM BLOCK   CARPAL TUNNEL RELEASE Right 2000   COLONOSCOPY  2020   HYSTEROSCOPY W/ ENDOMETRIAL ABLATION  02/23/2005   @[facility] ;  w/ novasure   HYSTEROSCOPY WITH D & C N/A 07/24/2022   Procedure: ATTEMPTED DILATATION AND CURETTAGE /HYSTEROSCOPY;  Surgeon: Jerene Bears, MD;  Location: [facility] [place];  Service: Gynecology;  Laterality:  N/A;   OPERATIVE ULTRASOUND N/A 07/24/2022   Procedure: OPERATIVE ULTRASOUND;  Surgeon: Jerene Bears, MD;  Location: [facility];  Service: Gynecology;  Laterality: N/A;   REVISION AMPUTATION OF FINGER Left 12/17/2000   @[facility]  by dr sypher;  injury  left little finger   TUBAL LIGATION Bilateral    yrs ago     Home Medications:  Prior to Admission medications   Medication Sig Start Date End Date Taking? Authorizing Provider  albuterol (VENTOLIN HFA) 108 (90 Base) MCG/ACT inhaler TAKE 2 PUFFS BY MOUTH EVERY 6 HOURS AS NEEDED FOR WHEEZE OR SHORTNESS OF BREATH 11/12/22  Yes Eden Emms, NP  amitriptyline (ELAVIL) 50 MG tablet Take 50 mg by mouth at bedtime. 08/28/23  Yes [provider]  apixaban (ELIQUIS) 5 MG TABS tablet TAKE 1 TABLET BY MOUTH TWICE A DAY 12/07/22  Yes Agbor-Etang, Arlys John, MD  ARIPiprazole (ABILIFY) 10 MG tablet TAKE 1/2 TABLET BY MOUTH DAILY 11/19/22  Yes Eden Emms, NP  benzonatate (TESSALON) 100 MG capsule Take 100 mg by mouth 3 (three) times daily as needed for cough. 09/26/23  Yes [provider]  clobetasol ointment (TEMOVATE) 0.05 % Apply to affected area every night for 4 weeks, then every other day for 4 weeks and then twice a week for 4 weeks or until resolution. 04/16/22  Yes Federico Flake, MD  fluticasone [facility]) 50 MCG/ACT nasal spray Place 1 spray into both nostrils daily. 11/05/23  Yes [provider]  loratadine (CLARITIN) 10 MG tablet Take 10 mg by mouth daily as needed. 11/06/23  Yes [provider]  losartan (COZAAR) 50 MG tablet Take 1 tablet (50 mg total) by mouth daily. 10/15/22 11/21/23 Yes Agbor-Etang, Arlys John, MD  metoprolol succinate (TOPROL-XL) 50 MG 24 hr tablet Take 1.5 tablets (75 mg) by mouth twice daily. Take with or immediately following a meal. 02/14/23  Yes Agbor-Etang, Arlys John, MD  neomycin-polymyxin b-dexamethasone (MAXITROL) 3.5-10000-0.1 SUSP Place 2 drops into both eyes every 6 (six) hours.  08/19/23  Yes [provider]  pantoprazole (PROTONIX) 40 MG tablet TAKE 1 TABLET BY MOUTH EVERY DAY 12/10/22  Yes Eden Emms, NP  Cholecalciferol ([employer] VIT D 5000 HIGH-POTENCY PO) Take by mouth every other day. Patient not taking: Reported on 11/21/2023    [provider]  mirtazapine (REMERON) 15 MG tablet TAKE 1 TABLET BY MOUTH EVERYDAY AT BEDTIME Patient not taking: Reported on 10/02/2023 11/19/22   Eden Emms, NP  topiramate (TOPAMAX) 25 MG tablet Take 25 mg by mouth at bedtime. Patient not taking: Reported on 11/21/2023 10/25/23   [provider]  traMADol (ULTRAM) 50 MG tablet Take 50 mg by mouth daily as needed for moderate pain or severe pain. Patient not taking: Reported on 10/02/2023 05/03/22   [provider]  WIXELA INHUB 250-50 MCG/ACT AEPB INHALE 1 PUFF INTO THE LUNGS IN THE MORNING AND AT BEDTIME. Patient not taking: Reported on 01/02/2023 11/15/22   Eden Emms,  NP    Inpatient Medications: Scheduled Meds:  amitriptyline  50 mg Oral QHS   apixaban  5 mg Oral BID   [START ON 11/22/2023] ARIPiprazole  5 mg Oral Daily   [START ON 11/22/2023] fluticasone  1 spray Each Nare Daily   metoprolol succinate  100 mg Oral BID   [START ON 11/22/2023] pantoprazole  40 mg Oral Daily   Continuous Infusions:  diltiazem (CARDIZEM) infusion 15 mg/hr (11/21/23 1651)   PRN Meds: ipratropium, loratadine, ondansetron **OR** ondansetron (ZOFRAN) IV  Allergies:    Allergies  Allergen Reactions   Codeine Nausea And Vomiting    Confusion and could not stay awake   Meloxicam Nausea Only    And stomach cramping   Mushroom Other (See Comments)   Paroxetine Hcl Other (See Comments)    Sleep all the time    Social History:   Social History   Socioeconomic History   Marital status: Married    Spouse name: Porfirio Bristol   Number of children: 2   Years of education: Not on file   Highest education level: Not on file  Occupational History   Not on file   Tobacco Use   Smoking status: Former    Current packs/day: 0.00    Average packs/day: 1 pack/day for 37.0 years (37.0 ttl pk-yrs)    Types: Cigarettes    Start date: 12/29/1982    Quit date: 12/29/2019    Years since quitting: 3.8   Smokeless tobacco: Never  Vaping Use   Vaping status: Every Day   Substances: Nicotine   Devices: juul  Substance and Sexual Activity   Alcohol use: Not Currently   Drug use: Never   Sexual activity: Yes    Partners: Male    Birth control/protection: Post-menopausal  Other Topics Concern   Not on file  Social History Narrative   Lives at home with husband      Fulltime: Designer, industrial/product   Social Drivers of Health   Financial Resource Strain: Not on file  Food Insecurity: Not on file  Transportation Needs: Not on file  Physical Activity: Not on file  Stress: Not on file  Social Connections: Unknown (12/19/2021)   [facility], [facility]   Social Network    Social Network: Not on file  Intimate Partner Violence: Unknown (11/10/2021)   Received from [employer], [facility]   HITS    Physically Hurt: Not on file    Insult or Talk Down To: Not on file    Threaten Physical Harm: Not on file    Scream or Curse: Not on file    Family History:    Family History  Problem Relation Age of Onset   Heart disease Mother    Diabetes Mother    COPD Mother    Hypertension Mother    Arthritis Mother    High Cholesterol Mother    Kidney disease Mother    Cancer Father 79       kidney cancer with removal   Heart attack Father    Alcohol abuse Father    Hearing loss Father    Heart disease Father    High Cholesterol Father    Hypertension Father    Heart attack Brother 56       heart attack that caused his death   Breast cancer Maternal Aunt 76   Breast cancer Maternal Grandmother 51   Depression Paternal Grandmother    Heart attack Paternal Grandfather    Heart  disease Paternal Grandfather    Diabetes Other        family HX,  unspecified     ROS:  Please see the history of present illness.  Review of Systems  Constitutional:  Positive for malaise/fatigue.  HENT: Negative.    Respiratory:  Positive for shortness of breath.   Cardiovascular:  Positive for palpitations.  Gastrointestinal: Negative.   Musculoskeletal: Negative.   Neurological: Negative.   Psychiatric/Behavioral: Negative.    All other systems reviewed and are negative.   Physical Exam/Data:   Vitals:   11/21/23 1555 11/21/23 1600 11/21/23 1615 11/21/23 1700  BP: 112/81 118/80 121/88 130/77  Pulse: (!) 129 (!) 127 (!) 129 (!) 127  Resp: 14 19 16  (!) 23  Temp:      TempSrc:      SpO2: 96% 95% 96% 100%  Weight:      Height:       No intake or output data in the 24 hours ending 11/21/23 1801    11/21/2023    2:46 PM 11/21/2023    1:12 PM 11/11/2023   10:05 AM  Last 3 Weights  Weight (lbs) 230 lb 2.6 oz 230 lb 2 oz 229 lb 6.4 oz  Weight (kg) 104.4 kg 104.384 kg 104.055 kg     Body mass index is 39.51 kg/m.  General:  Well nourished, well developed, in no acute distress HEENT: normal Neck: no JVD Vascular: No carotid bruits; Distal pulses 2+ bilaterally Cardiac: Tachycardic, regular;  no murmur  Lungs:  clear to auscultation bilaterally, no wheezing, rhonchi or rales  Abd: soft, nontender, no hepatomegaly  Ext: no edema Musculoskeletal:  No deformities, BUE and BLE strength normal and equal Skin: warm and dry  Neuro:  CNs 2-12 intact, no focal abnormalities noted Psych:  Normal affect   EKG:  The EKG was personally reviewed and demonstrates: Atrial tachycardia rate 124 bpm Telemetry:  Telemetry was personally reviewed and demonstrates: Atrial tachycardia rate 120  Relevant CV Studies: Echo   1. Left ventricular ejection fraction, by estimation, is 65 to 70%. The  left ventricle has normal function. The left ventricle has no regional  wall motion abnormalities. Left ventricular diastolic parameters are  consistent with  Grade II diastolic  dysfunction (pseudonormalization). The average left ventricular global  longitudinal strain is -19.3 %. The global longitudinal strain is normal.   2. Right ventricular systolic function is normal. The right ventricular  size is normal. There is normal pulmonary artery systolic pressure. The  estimated right ventricular systolic pressure is 20.2 mmHg.   3. Left atrial size was mildly dilated.   4. The mitral valve is normal in structure. Mild mitral valve  regurgitation. No evidence of mitral stenosis.   5. The aortic valve is normal in structure. Aortic valve regurgitation is  not visualized. No aortic stenosis is present.   6. The inferior vena cava is normal in size with greater than 50%  respiratory variability, suggesting right atrial pressure of 3 mmHg   Laboratory Data:  High Sensitivity Troponin:   Recent Labs  Lab 11/21/23 1445  TROPONINIHS 10     Chemistry Recent Labs  Lab 11/21/23 1445  NA 139  K 3.9  CL 104  CO2 26  GLUCOSE 100*  BUN 8  CREATININE 0.86  CALCIUM 9.4  MG 2.1  GFRNONAA >60  ANIONGAP 9    Recent Labs  Lab 11/21/23 1445  PROT 8.1  ALBUMIN 4.2  AST 39  ALT 49*  ALKPHOS  81  BILITOT 0.6   Lipids No results for input(s): "CHOL", "TRIG", "HDL", "LABVLDL", "LDLCALC", "CHOLHDL" in the last 168 hours.  Hematology Recent Labs  Lab 11/21/23 1445  WBC 9.6  RBC 4.93  HGB 15.0  HCT 45.5  MCV 92.3  MCH 30.4  MCHC 33.0  RDW 12.5  PLT 230   Thyroid  Recent Labs  Lab 11/21/23 1445  TSH 3.173  FREET4 0.81    BNPNo results for input(s): "BNP", "PROBNP" in the last 168 hours.  DDimer No results for input(s): "DDIMER" in the last 168 hours.   Radiology/Studies:  DG Chest Port 1 View Result Date: 11/21/2023 CLINICAL DATA:  Weakness EXAM: PORTABLE CHEST 1 VIEW COMPARISON:  07/25/2021 FINDINGS: The heart size and mediastinal contours are within normal limits. Both lungs are clear. The visualized skeletal structures are  unremarkable. IMPRESSION: No active disease. Electronically Signed   By: Janeece Mechanic M.D.   On: 11/21/2023 17:25     Assessment and Plan:   Narrow complex tachycardia/SVT Onset last week, struggling at home over the past week with rates up to 180 with bathing and dressing, with shortness of breath, palpitations, fatigue -Brief response to adenosine 12 mg in the office, did not sustain sinus rhythm - Rate has responded to diltiazem infusion 15 mg/h - Will increase metoprolol succinate up to 100 twice daily, continue diltiazem infusion overnight - Case discussed with Dr. Clinton Danas, EP who recommended trying to avoid amiodarone given ablation next week on Friday - Will aim for rate control on diltiazem and metoprolol succinate, with symptom improvement - She hopes to avoid cardioversion if possible - Continue Eliquis 5 twice daily - Appears euvolemic    For questions or updates, please contact [facility] Please consult [email or web address] for contact info under    Signed, Oksana Deberry, MD  11/21/2023 6:01 PM

## 2023-11-21 NOTE — ED Notes (Signed)
 Gave pt cup of ice water with her metoprolol HR dropped from 120s to 86-90. NADN

## 2023-11-21 NOTE — H&P (Signed)
 History and Physical    Victoria Williamson:[id number] DOB: Apr 17, 1966 DOA: 11/21/2023  PCP: Luise Saint, MD (Confirm with patient/family/NH records and if not entered, this has to be entered at [facility] point of entry) Patient coming from: Home  I have personally briefly reviewed patient's old medical records in [facility]  Chief Complaint: Palpitations  HPI: Victoria Williamson is a 58 y.o. female with medical history significant of paroxysmal SVT/paroxysmal a flutter on Eliquis, status post ablation 2021, HTN, chronic HFpEF, COPD, morbid obesity, sent from cardiologist office for evaluation of recurrent A-flutter.  Patient has history of paroxysmal SVT/A-flutter, has been controlled with metoprolol.  Recently, starting January-February, however due to work stress, patient started to feel episodes of palpitations.  Patient was instructed to increase metoprolol dosage to 100 mg twice daily for rate control however her blood pressure dropped significantly and patient went back to take metoprolol 75 mg twice daily.  In addition, patient was scheduled to have ablation on April 25.  Today, patient went back to see cardiology for follow-up, but found the patient in rapid heart rate, patient was given adenosine 6 mg and 12 mg and EKG showed a flutter and patient sent to ED for further management.  Patient denies any chest pain shortness of breath cough.  ED Course: Afebrile, heart rate 130-150, blood pressure 120/75.  Chest x-ray showed no acute infiltrates.  Blood work showed K3.9, magnesium 2.0, BUN 8 creatinine 0.8 hemoglobin 15 WBC 10.6.  Patient was given Cardizem bolus x 1 initially rhythm converted back to sinus however soon went back to a flutter again.  Cardizem drip started  Review of Systems: As per HPI otherwise 14 point review of systems negative.    Past Medical History:  Diagnosis Date   Anticoagulant long-term use    eliquis--- managed by cardiology   Arthritis     Ascending aortic aneurysm (HCC) 05/2022   had surgical consult 05-21-2022 w/ wayne gold PA ,   4.0 cm per CT 05-21-2022 in epic, active survillance   Chronic low back pain    DDD (degenerative disc disease), lumbar    Diastolic CHF, chronic (HCC)    followed by cardiology;    last echo 07-27-2021 ef 60-65%   DOE (dyspnea on exertion)    Dysautonomia (HCC) 12/18/2013   dx by cardiologist--- dr Rodolfo Clan   [phone]  pt stated no issues in passed few yrs)   Edema of both lower extremities    intermittant per pt   Fatty liver    GAD (generalized anxiety disorder)    History of adenomatous polyp of colon    Hx of skin cancer, basal cell    Hypertension    MDD (major depressive disorder)    PAF (paroxysmal atrial fibrillation) (HCC) 09/2019   cardiologist-  dr b. agber-etang/  EP cardiologist-- dr Marven Slimmer   PMB (postmenopausal bleeding)    PONV (postoperative nausea and vomiting)    Pre-diabetes    PSVT (paroxysmal supraventricular tachycardia) (HCC)    Pulmonary emphysema (HCC)    followed by pcp   Pulmonary nodule    followed by pcp   Spondylolisthesis of lumbar region     Past Surgical History:  Procedure Laterality Date   ATRIAL FIBRILLATION ABLATION N/A 07/15/2020   Procedure: ATRIAL FIBRILLATION ABLATION;  Surgeon: Boyce Byes, MD;  Location: [facility];  Service: Cardiovascular;  Laterality: N/A;   BREAST BIOPSY Right 07/17/2023   MM RT BREAST BX W LOC DEV  1ST LESION IMAGE BX SPEC STEREO GUIDE 07/17/2023 [facility]   CARDIAC CATHETERIZATION  06/03/2013   @[facility]  by dr Lady Gary;  per pt was told normal coronary arteries   CARDIOVERSION N/A 05/11/2020   Procedure: CARDIOVERSION;  Surgeon: Debbe Odea, MD;  Location: [facility] ORS;  Service: Cardiovascular;  Laterality: N/A;   CARPAL TUNNEL RELEASE Left 06/23/2020   Procedure: CARPAL TUNNEL RELEASE;  Surgeon: Cindee Salt, MD;  Location: [facility];  Service: Orthopedics;  Laterality: Left;  IV  REGIONAL FOREARM BLOCK   CARPAL TUNNEL RELEASE Right 2000   COLONOSCOPY  2020   HYSTEROSCOPY W/ ENDOMETRIAL ABLATION  02/23/2005   @[facility] ;  w/ novasure   HYSTEROSCOPY WITH D & C N/A 07/24/2022   Procedure: ATTEMPTED DILATATION AND CURETTAGE /HYSTEROSCOPY;  Surgeon: Jerene Bears, MD;  Location: [facility];  Service: Gynecology;  Laterality: N/A;   OPERATIVE ULTRASOUND N/A 07/24/2022   Procedure: OPERATIVE ULTRASOUND;  Surgeon: Jerene Bears, MD;  Location: [facility];  Service: Gynecology;  Laterality: N/A;   REVISION AMPUTATION OF FINGER Left 12/17/2000   @[facility]  by dr sypher;  injury  left little finger   TUBAL LIGATION Bilateral    yrs ago     reports that she quit smoking about 3 years ago. Her smoking use included cigarettes. She started smoking about 40 years ago. She has a 37 pack-year smoking history. She has never used smokeless tobacco. She reports that she does not currently use alcohol. She reports that she does not use drugs.  Allergies  Allergen Reactions   Codeine Nausea And Vomiting    Confusion and could not stay awake   Meloxicam Nausea Only    And stomach cramping   Mushroom Other (See Comments)   Paroxetine Hcl Other (See Comments)    Sleep all the time    Family History  Problem Relation Age of Onset   Heart disease Mother    Diabetes Mother    COPD Mother    Hypertension Mother    Arthritis Mother    High Cholesterol Mother    Kidney disease Mother    Cancer Father 32       kidney cancer with removal   Heart attack Father    Alcohol abuse Father    Hearing loss Father    Heart disease Father    High Cholesterol Father    Hypertension Father    Heart attack Brother 58       heart attack that caused his death   Breast cancer Maternal Aunt 8   Breast cancer Maternal Grandmother 87   Depression Paternal Grandmother    Heart attack Paternal Grandfather    Heart disease Paternal Grandfather    Diabetes Other         family HX, unspecified    Prior to Admission medications   Medication Sig Start Date End Date Taking? Authorizing Provider  albuterol (VENTOLIN HFA) 108 (90 Base) MCG/ACT inhaler TAKE 2 PUFFS BY MOUTH EVERY 6 HOURS AS NEEDED FOR WHEEZE OR SHORTNESS OF BREATH Patient not taking: Reported on 11/21/2023 11/12/22   Eden Emms, NP  amitriptyline (ELAVIL) 50 MG tablet Take 50 mg by mouth at bedtime. 08/28/23   [provider]  apixaban (ELIQUIS) 5 MG TABS tablet TAKE 1 TABLET BY MOUTH TWICE A DAY 12/07/22   Debbe Odea, MD  ARIPiprazole (ABILIFY) 10 MG tablet TAKE 1/2 TABLET BY MOUTH DAILY 11/19/22   Eden Emms, NP  Cholecalciferol ([employer]  VIT D 5000 HIGH-POTENCY PO) Take by mouth every other day. Patient not taking: Reported on 11/21/2023    [provider]  clobetasol ointment (TEMOVATE) 0.05 % Apply to affected area every night for 4 weeks, then every other day for 4 weeks and then twice a week for 4 weeks or until resolution. Patient not taking: Reported on 11/11/2023 04/16/22   Federico Flake, MD  fluticasone [facility]) 50 MCG/ACT nasal spray Place 1 spray into both nostrils daily. 11/05/23   [provider]  loratadine (CLARITIN) 10 MG tablet Take 10 mg by mouth daily as needed. 11/06/23   [provider]  losartan (COZAAR) 50 MG tablet Take 1 tablet (50 mg total) by mouth daily. 10/15/22 11/21/23  Debbe Odea, MD  metoprolol succinate (TOPROL-XL) 50 MG 24 hr tablet Take 1.5 tablets (75 mg) by mouth twice daily. Take with or immediately following a meal. 02/14/23   Debbe Odea, MD  mirtazapine (REMERON) 15 MG tablet TAKE 1 TABLET BY MOUTH EVERYDAY AT BEDTIME Patient not taking: Reported on 11/21/2023 11/19/22   Eden Emms, NP  pantoprazole (PROTONIX) 40 MG tablet TAKE 1 TABLET BY MOUTH EVERY DAY 12/10/22   Eden Emms, NP  traMADol (ULTRAM) 50 MG tablet Take 50 mg by mouth daily as needed for moderate pain or severe pain. Patient not taking:  Reported on 10/02/2023 05/03/22   [provider]  WIXELA INHUB 250-50 MCG/ACT AEPB INHALE 1 PUFF INTO THE LUNGS IN THE MORNING AND AT BEDTIME. Patient not taking: Reported on 01/02/2023 11/15/22   Eden Emms, NP    Physical Exam: Vitals:   11/21/23 1555 11/21/23 1600 11/21/23 1615 11/21/23 1700  BP: 112/81 118/80 121/88 130/77  Pulse: (!) 129 (!) 127 (!) 129 (!) 127  Resp: 14 19 16  (!) 23  Temp:      TempSrc:      SpO2: 96% 95% 96% 100%  Weight:      Height:        Constitutional: NAD, calm, comfortable Vitals:   11/21/23 1555 11/21/23 1600 11/21/23 1615 11/21/23 1700  BP: 112/81 118/80 121/88 130/77  Pulse: (!) 129 (!) 127 (!) 129 (!) 127  Resp: 14 19 16  (!) 23  Temp:      TempSrc:      SpO2: 96% 95% 96% 100%  Weight:      Height:       Eyes: PERRL, lids and conjunctivae normal ENMT: Mucous membranes are moist. Posterior pharynx clear of any exudate or lesions.Normal dentition.  Neck: normal, supple, no masses, no thyromegaly Respiratory: clear to auscultation bilaterally, no wheezing, no crackles. Normal respiratory effort. No accessory muscle use.  Cardiovascular: Regular rate and rhythm, no murmurs / rubs / gallops. No extremity edema. 2+ pedal pulses. No carotid bruits.  Abdomen: no tenderness, no masses palpated. No hepatosplenomegaly. Bowel sounds positive.  Musculoskeletal: no clubbing / cyanosis. No joint deformity upper and lower extremities. Good ROM, no contractures. Normal muscle tone.  Skin: no rashes, lesions, ulcers. No induration Neurologic: CN 2-12 grossly intact. Sensation intact, DTR normal. Strength 5/5 in all 4.  Psychiatric: Normal judgment and insight. Alert and oriented x 3. Normal mood.     Labs on Admission: I have personally reviewed following labs and imaging studies  CBC: Recent Labs  Lab 11/21/23 1445  WBC 9.6  HGB 15.0  HCT 45.5  MCV 92.3  PLT 230   Basic Metabolic Panel: Recent Labs  Lab 11/21/23 1445  NA 139  K  3.9  CL 104  CO2 26  GLUCOSE 100*  BUN 8  CREATININE 0.86  CALCIUM 9.4  MG 2.1   GFR: Estimated Creatinine Clearance: 86 mL/min (by C-G formula based on SCr of 0.86 mg/dL). Liver Function Tests: Recent Labs  Lab 11/21/23 1445  AST 39  ALT 49*  ALKPHOS 81  BILITOT 0.6  PROT 8.1  ALBUMIN 4.2   No results for input(s): "LIPASE", "AMYLASE" in the last 168 hours. No results for input(s): "AMMONIA" in the last 168 hours. Coagulation Profile: Recent Labs  Lab 11/21/23 1445  INR 1.3*   Cardiac Enzymes: No results for input(s): "CKTOTAL", "CKMB", "CKMBINDEX", "TROPONINI" in the last 168 hours. BNP (last 3 results) No results for input(s): "PROBNP" in the last 8760 hours. HbA1C: No results for input(s): "HGBA1C" in the last 72 hours. CBG: No results for input(s): "GLUCAP" in the last 168 hours. Lipid Profile: No results for input(s): "CHOL", "HDL", "LDLCALC", "TRIG", "CHOLHDL", "LDLDIRECT" in the last 72 hours. Thyroid Function Tests: No results for input(s): "TSH", "T4TOTAL", "FREET4", "T3FREE", "THYROIDAB" in the last 72 hours. Anemia Panel: No results for input(s): "VITAMINB12", "FOLATE", "FERRITIN", "TIBC", "IRON", "RETICCTPCT" in the last 72 hours. Urine analysis:    Component Value Date/Time   BILIRUBINUR neg 04/16/2022 1439   PROTEINUR Negative 04/16/2022 1439   UROBILINOGEN 0.2 04/16/2022 1439   NITRITE neg 04/16/2022 1439   LEUKOCYTESUR Negative 04/16/2022 1439    Radiological Exams on Admission: No results found.  EKG: Independently reviewed.  A flutter with 2-1 transduction  Assessment/Plan Principal Problem:   Flutter-fibrillation (HCC)  (please populate well all problems here in Problem List. (For example, if patient is on BP meds at home and you resume or decide to hold them, it is a problem that needs to be her. Same for CAD, COPD, HLD and so on)  A flutter with RVR - Continue Cardizem drip - Antiflatulent discussed with cardiology Dr. Gollan  regarding rate control plan, cardiology does not recommend amiodarone drip at this point, for the possible interaction with ablation.  Patient declined offer of cardioversion. - Continue metoprolol 75 mg twice daily - Hold off losartan - Continue Eliquis -TSH and UDS  COPD - No acute concern - Avoid albuterol  Anxiety/depression - No acute concern, continue Abilify and Remeron  DVT prophylaxis: Eliquis Code Status: Full code Family Communication: Husband at bedside Disposition Plan: Expect less than 2 midnight hospital Consults called: Cardiology Admission status: PCU observation   [place] MD [facility] Pager [phone]  11/21/2023, 5:21 PM

## 2023-11-21 NOTE — ED Notes (Signed)
 CCMD contaced to place pt on the cardiac monitor.

## 2023-11-21 NOTE — ED Notes (Signed)
Hospitalist Provider at bedside. 

## 2023-11-21 NOTE — ED Provider Notes (Signed)
 [facility] Provider Note    Event Date/Time   First MD Initiated Contact with Patient 11/21/23 1503     (approximate)   History   Tachycardia   HPI  Victoria Williamson is a 58 y.o. female  with history of SVT who comes in with concerns for elevated heart rate.  Patient is scheduled for ablation next Friday.  She is on Eliquis.  Patient reports that she has been feeling her heart racing for a few days now.  She was given 6 then 12 of adenosine.  I reviewed the cardiology note from 11/11/2023 where patient has a history of paroxysmal A-fib status post ablation on 12/21, SVT she has been compliant with Toprol XL, Eliquis  Patient reports that her heart rate speed up and then if she calms down and sits down that it will go away after a few minutes.  She reports that she has this intermittent nature of palpitations for over a month.  She reports a prior ablation years ago and that it prevented her from having any episodes for a few years but now they have started back up again.  She is planned to have an ablation.  She states that she felt fine this morning when she was at the cardiology office she went back into a SVT versus A-fib.  She denies really any symptoms other than just feeling her heart racing.  She denies feeling like any fluid on her.   Physical Exam   Triage Vital Signs: ED Triage Vitals  Encounter Vitals Group     BP 11/21/23 1445 (!) 156/112     Systolic BP Percentile --      Diastolic BP Percentile --      Pulse Rate 11/21/23 1445 (!) 147     Resp 11/21/23 1445 16     Temp 11/21/23 1445 97.6 F (36.4 C)     Temp Source 11/21/23 1445 Oral     SpO2 11/21/23 1445 100 %     Weight 11/21/23 1446 230 lb 2.6 oz (104.4 kg)     Height 11/21/23 1446 5\' 4"  (1.626 m)     Head Circumference --      Peak Flow --      Pain Score 11/21/23 1446 0     Pain Loc --      Pain Education --      Exclude from Growth Chart --     Most recent vital  signs: Vitals:   11/21/23 1445  BP: (!) 156/112  Pulse: (!) 147  Resp: 16  Temp: 97.6 F (36.4 C)  SpO2: 100%     General: Awake, no distress.  CV:  Good peripheral perfusion.  Tachycardic Resp:  Normal effort.  Abd:  No distention.  Soft and nontender Other:  No swelling in legs.  No calf tenderness   ED Results / Procedures / Treatments   Labs (all labs ordered are listed, but only abnormal results are displayed) Labs Reviewed  CBC  COMPREHENSIVE METABOLIC PANEL WITH GFR  APTT  PROTIME-INR  TROPONIN I (HIGH SENSITIVITY)     EKG  My interpretation of EKG:  Atrial flutter with a rate of 151 without any ST elevation or T wave inversions, QTc prolonged at 547 Repeat EKG appears sinus tachycardia rate of 137, no ST elevation no T wave inversions   RADIOLOGY I have reviewed the xray personally and interpreted no focal pneumonia noted   PROCEDURES:  Critical Care performed: Yes, see critical  care procedure note(s)  .1-3 Lead EKG Interpretation  Performed by: Concha Se, MD Authorized by: Concha Se, MD     Interpretation: abnormal     ECG rate:  150   ECG rate assessment: tachycardic     Rhythm: atrial flutter     Ectopy: none     Conduction: normal   .Critical Care  Performed by: Concha Se, MD Authorized by: Concha Se, MD   Critical care provider statement:    Critical care time (minutes):  30   Critical care was necessary to treat or prevent imminent or life-threatening deterioration of the following conditions:  Cardiac failure   Critical care was time spent personally by me on the following activities:  Development of treatment plan with patient or surrogate, discussions with consultants, evaluation of patient's response to treatment, examination of patient, ordering and review of laboratory studies, ordering and review of radiographic studies, ordering and performing treatments and interventions, pulse oximetry, re-evaluation of patient's  condition and review of old charts    MEDICATIONS ORDERED IN ED: Medications  diltiazem (CARDIZEM) 125 mg in dextrose 5% 125 mL (1 mg/mL) infusion (5 mg/hr Intravenous New Bag/Given 11/21/23 1541)  diltiazem (CARDIZEM) injection 10 mg (10 mg Intravenous Given 11/21/23 1534)  diltiazem (CARDIZEM) injection 15 mg (15 mg Intravenous Given 11/21/23 1549)     IMPRESSION / MDM / ASSESSMENT AND PLAN / ED COURSE  I reviewed the triage vital signs and the nursing notes.   Patient's presentation is most consistent with acute presentation with potential threat to life or bodily function.   Patient comes in with concerns for SVT versus atrial flutter.  I discussed case with Dr. Mariah Milling.  He was actually down with patient when she received the adenosine.  They did think it looked more like an SVT and did break but then read right back into it.  We had discussed cardioversion with the patient but she did not want to have this done given she reports going in and out of it for some time now that if we cardioverted her it would be high chance that she would just flip right back into the rhythm later today or tomorrow.  Therefore after discussion with Dr. Mariah Milling about antiarrhythmics versus rate control the decision was made to start off with some diltiazem to see if this could help patient.  Labs ordered evaluate for any Electra abnormalities, AKI.  Seems less likely consistent with ACS. No signs or symptoms of CHF or DVT.   INR 1.3 CBC reassuring CMP shows normal potassium troponin negative  Discussed with Dr. Mariah Milling who discussed with EP who did want to keep patient off amiodarone.  Will admit to the hospitalist under diltiazem infusion  The patient is on the cardiac monitor to evaluate for evidence of arrhythmia and/or significant heart rate changes.      FINAL CLINICAL IMPRESSION(S) / ED DIAGNOSES   Final diagnoses:  SVT (supraventricular tachycardia) (HCC)     Rx / DC Orders   ED Discharge  Orders     None        Note:  This document was prepared using Dragon voice recognition software and may include unintentional dictation errors.   Concha Se, MD 11/21/23 [phone]

## 2023-11-21 NOTE — ED Triage Notes (Signed)
 Pt here from [facility] in SVT. Pt received 6mg  and 12mg  of Adenosine at 1345. Pt states she has felt her heart racing for the past few days. Pt is scheduled for an ablation next Friday. Pt is on Eliquis.

## 2023-11-21 NOTE — ED Triage Notes (Signed)
 First Nurse Note: Patient to ED from [facility]. PT was in SVT and given 6 of adenosine and then another 12 around 1345 per staff. Charge RN called at this time and patient taken to RM 9 by [place], Charity fundraiser.

## 2023-11-21 NOTE — Progress Notes (Signed)
 Cardiology Office Note:  .   Date:  11/21/2023  ID:  Victoria Williamson, DOB 11-Sep-1965, MRN [id number] PCP: Luise Saint, MD  [facility] Providers Cardiologist:  Constancia Delton, MD Electrophysiologist:  Boyce Byes, MD    History of Present Illness: .   Victoria Williamson is a 58 y.o. female with a past medical history of hypertension, paroxysmal atrial fibrillation status post ablation (07/2020), SVT, obesity, who presents today for follow-up of her paroxysmal atrial fibrillation.   Previous PET myocardial perfusion stress test in 01/2020 showed no evidence of ischemia.  Echocardiogram 12/2019 showed normal systolic function, normal wall thickness, EF 55%, G2 DD.  Left heart cath in 2014 she was told she had normal coronary arteries.  Echocardiogram completed 07/2021 revealed LVEF of 60-65%, borderline ascending aortic dilatation at 38 mm.  Of note she was initially diagnosed with atrial fibrillation in February 2021.  She underwent catheter ablation for atrial fibrillation in December 2021.  During the procedure, the pulmonary veins and CTI were ablated.  She was last seen in clinic by EP 09/2023 with reported SVT that was different than her prior A-fib episodes.  They discussed antiarrhythmic drugs in the past but there was were going to interfere with her Abilify and were not started.  She was taking metoprolol and was doing well without sustained recurrence of arrhythmia.  After further discussion of treatment options it was decided that she would proceed with redo catheter ablation which was deemed reasonable.  She was advised she would need to hold her metoprolol for 5 days prior to her ablation procedure.   She was last seen in clinic/7/25 by Dr.Agbor-Etang.  She was continued to have palpitations over the past several months initially happening once daily milligram 1 twice daily lasting a few minutes with associated symptoms of dizziness.  She was evaluated by EP and with  repeat ablation scheduled for later this month.  She been compliant with her Toprol-XL and Eliquis without any issues.  There were no medication changes that were made and no further testing that was ordered it was still waiting until her ablation was planned with APP.  She returns to clinic today with palpitations, fatigue, and shortness of breath. She has been scheduled for an ablation procedure with EP on 11/29/23. She has not missed any doses of her Eliquis. Denies any bleeding with no blood noted in her stool or urine. On arrival her EKG was completed and revealed she was in SVT at 150 bpm.  She been having ongoing incidence of elevated heart rate with any movement. She stated that this morning just getting in the shower and dressed caused her heart rate to become and remain elevated. She stated that over the last several weeks it has been an ongoing issue.  She has been scheduled for her upcoming ablation procedure and has been advised to start holding her metoprolol this Saturday.  There were several discussions over antiarrhythmic medications with Dr. Marven Slimmer but unfortunately she was unable to take the majority of those due to her current Abilify prescription.  ROS: 10 point review of system has been reviewed and considered negative except what is listed in HPI  Studies Reviewed: Aaron Aas   EKG Interpretation Date/Time:  Thursday November 21 2023 13:20:26 EDT Ventricular Rate:  150 PR Interval:  142 QRS Duration:  90 QT Interval:  334 QTC Calculation: 527 R Axis:   46  Text Interpretation: Sinus tachycardia Nonspecific ST abnormality When compared with ECG of  11-Nov-2023 10:09, Vent. rate has increased BY  83 BPM ST now depressed in Lateral leads Confirmed by Ronald Cockayne ([id number]) on 11/21/2023 1:23:50 PM    2D echo 10/29/2023 1. Left ventricular ejection fraction, by estimation, is 65 to 70%. The  left ventricle has normal function. The left ventricle has no regional  wall motion abnormalities.  Left ventricular diastolic parameters are  consistent with Grade II diastolic  dysfunction (pseudonormalization). The average left ventricular global  longitudinal strain is -19.3 %. The global longitudinal strain is normal.   2. Right ventricular systolic function is normal. The right ventricular  size is normal. There is normal pulmonary artery systolic pressure. The  estimated right ventricular systolic pressure is 20.2 mmHg.   3. Left atrial size was mildly dilated.   4. The mitral valve is normal in structure. Mild mitral valve  regurgitation. No evidence of mitral stenosis.   5. The aortic valve is normal in structure. Aortic valve regurgitation is  not visualized. No aortic stenosis is present.   6. The inferior vena cava is normal in size with greater than 50%  respiratory variability, suggesting right atrial pressure of 3 mmHg.   2D echo 07/27/2021 1. Left ventricular ejection fraction, by estimation, is 60 to 65%. The  left ventricle has normal function. The left ventricle has no regional  wall motion abnormalities. There is mild left ventricular hypertrophy.  Left ventricular diastolic parameters  were normal.   2. Right ventricular systolic function is normal. The right ventricular  size is normal. There is mildly elevated pulmonary artery systolic  pressure. The estimated right ventricular systolic pressure is 36.8 mmHg.   3. The mitral valve is normal in structure. No evidence of mitral valve  regurgitation. No evidence of mitral stenosis.   4. The aortic valve was not well visualized. Aortic valve regurgitation  is not visualized. No aortic stenosis is present.   5. There is borderline dilatation of the ascending aorta, measuring 38  mm.   6. The inferior vena cava is normal in size with greater than 50%  respiratory variability, suggesting right atrial pressure of 3 mmHg.    Risk Assessment/Calculations:    CHA2DS2-VASc Score = 3   This indicates a 3.2% annual  risk of stroke. The patient's score is based upon: CHF History: 1 HTN History: 1 Diabetes History: 0 Stroke History: 0 Vascular Disease History: 0 Age Score: 0 Gender Score: 1            Physical Exam:   VS:  BP 120/80 (BP Location: Left Arm, Patient Position: Sitting, Cuff Size: Large)   Pulse (!) 150   Ht 5\' 4"  (1.626 m)   Wt 230 lb 2 oz (104.4 kg)   SpO2 98%   BMI 39.50 kg/m    Wt Readings from Last 3 Encounters:  11/21/23 230 lb 2.6 oz (104.4 kg)  11/21/23 230 lb 2 oz (104.4 kg)  11/11/23 229 lb 6.4 oz (104.1 kg)    GEN: Well nourished, well developed in no acute distress NECK: No JVD; No carotid bruits CARDIAC: RRR, tachycardic, no murmurs, rubs, gallops RESPIRATORY:  Clear to auscultation without rales, wheezing or rhonchi  ABDOMEN: Soft, non-tender, non-distended EXTREMITIES: Trace pretibial edema; No deformity   ASSESSMENT AND PLAN: .   Paroxysmal SVT/palpitation treatment stable admission EKG (SVT with rates of 150.  She was feeling fatigue and shortness of breath.  Once heart rate got above 160 bpm she did not feel palpitations.  Blood pressure remained stable.  She was connected to the EKG machine for continuous strips as well as the defibrillator.  An IV was started in her right [facility] with normal saline.  She was given 6 mg of adenosine IV with no change in heart rate she was then given an additional 12 mg dose where she had a slight slowing of her heart rate to reveal an underlying rhythm of sinus rhythm in the 70s versus atrial flutter.  With continued inability to decrease her rate she was sent to the emergency department for further evaluation.  Prior to administration of adenosine we did speak with the EP Dr. Daneil Dunker who is on-call and advised potentially to try to give her adenosine to break heart rate and if not she would likely need cardioversion or further evaluation in the emergency department.  Continuous telemetry monitoring with strips.  During the process were  reviewed with Dr. Gollan and without being able to start her on IV diltiazem or amiodarone in clinic and advised that she be evaluated in the emergency department.  Paroxysmal atrial fibrillation status post ablation 07/2020 with continuing symptoms of shortness of breath, fatigue, occasional palpitations.  She is scheduled for an upcoming ablation on 11/29/23.  She has been continued on apixaban 5 mg twice daily for CHA2DS2-VASc score of at least 3 for stroke prophylaxis with no missed doses and no incidence of bleeding.  She has also been continued on Toprol-XL 75 mg twice daily. She had previously been advised to stop taking her Toprol-XL this coming Saturday after further conversation with the EP it was advised that she would stop taking her Toprol-XL after Monday dose.  With her being in SVT today without any reoccurrence of A-fib or a flutter during brief pause after adenosine further workup will be completed in the emergency department.  Primary hypertension with blood pressure of 120/80 which should remain stable.  She had been continued on Toprol-XL 75 mg twice daily and losartan 50 mg daily.  Has been encouraged to continue to monitor pressures 1 to 2 hours postmedication administration at home as well.  Obesity with BMI 39.51.  Weight loss is advised.  Ectatic/mild ascending aortic dilatation previously reviewed chest CT ascending aorta measuring 40 mm 07/2020, 38 mm, echo 07/2021, repeat echo completed 10/2023 with no mention of findings. Will have primary cardiologist review films.       Dispo: Patient return to clinic to see MD/APP in 1 to 2 weeks or sooner if needed post emergency department visit.  Signed, Amber Williard, NP

## 2023-11-21 NOTE — ED Notes (Signed)
 Message to Vallarie Gauze, MD: Victoria Williamson was on a diltiazem drip at 15mg /hr. I gave her a cup of ice water with her metoprolol and HR came down to 86-93. I have stopped her dilt drip as she is now in NSR HR 86, bp 127/77. She has since gotten up to restroom and HR has remained stable.

## 2023-11-21 NOTE — ED Notes (Signed)
 Patient placed on cardiac pads

## 2023-11-22 ENCOUNTER — Encounter: Payer: Self-pay | Admitting: Internal Medicine

## 2023-11-22 DIAGNOSIS — I4891 Unspecified atrial fibrillation: Secondary | ICD-10-CM | POA: Diagnosis not present

## 2023-11-22 DIAGNOSIS — I471 Supraventricular tachycardia, unspecified: Secondary | ICD-10-CM | POA: Diagnosis not present

## 2023-11-22 DIAGNOSIS — I4892 Unspecified atrial flutter: Secondary | ICD-10-CM | POA: Diagnosis not present

## 2023-11-22 LAB — HIV ANTIBODY (ROUTINE TESTING W REFLEX): HIV Screen 4th Generation wRfx: NONREACTIVE

## 2023-11-22 MED ORDER — METOPROLOL SUCCINATE ER 100 MG PO TB24: 100.0000 mg | ORAL_TABLET | Freq: Two times a day (BID) | ORAL | Status: DC

## 2023-11-22 NOTE — Plan of Care (Signed)

## 2023-11-22 NOTE — Progress Notes (Signed)
 Cardiology Progress Note   Patient Name: Victoria Williamson Date of Encounter: 11/22/2023  Primary Cardiologist: Constancia Delton, MD  Subjective   Maintaining sinus rhythm this AM.  No events overnight.  Feels well.  Eager to go home. Objective   Inpatient Medications    Scheduled Meds:  amitriptyline   25 mg Oral QHS   apixaban   5 mg Oral BID   ARIPiprazole   5 mg Oral Daily   fluticasone   1 spray Each Nare Daily   metoprolol  succinate  100 mg Oral BID   pantoprazole   40 mg Oral Daily   Continuous Infusions:  PRN Meds: ipratropium, loratadine , ondansetron  **OR** ondansetron  (ZOFRAN ) IV   Vital Signs    Vitals:   11/21/23 2149 11/21/23 2332 11/22/23 0358 11/22/23 0921  BP: 112/84 114/67 101/66 107/69  Pulse: 72 71 66 68  Resp: 18 18 18    Temp: 98.6 F (37 C) 97.8 F (36.6 C) 97.6 F (36.4 C) 98.4 F (36.9 C)  TempSrc: Oral     SpO2: 100% 99% 97% 97%  Weight:      Height:        Intake/Output Summary (Last 24 hours) at 11/22/2023 1003 Last data filed at 11/21/2023 1954 Gross per 24 hour  Intake 68.27 ml  Output --  Net 68.27 ml   Filed Weights   11/21/23 1446  Weight: 104.4 kg    Physical Exam   GEN: Well nourished, well developed, in no acute distress.  HEENT: Grossly normal.  Neck: Supple, no JVD, carotid bruits, or masses. Cardiac: RRR, no murmurs, rubs, or gallops. No clubbing, cyanosis, edema.  Radials 2+, DP/PT 2+ and equal bilaterally.  Respiratory:  Respirations regular and unlabored, clear to auscultation bilaterally. GI: Soft, nontender, nondistended, BS + x 4. MS: no deformity or atrophy. Skin: warm and dry, no rash. Neuro:  Strength and sensation are intact. Psych: AAOx3.  Normal affect.  Labs    Chemistry Recent Labs  Lab 11/21/23 1445  NA 139  K 3.9  CL 104  CO2 26  GLUCOSE 100*  BUN 8  CREATININE 0.86  CALCIUM 9.4  PROT 8.1  ALBUMIN 4.2  AST 39  ALT 49*  ALKPHOS 81  BILITOT 0.6  GFRNONAA >60  ANIONGAP 9      Hematology Recent Labs  Lab 11/21/23 1445  WBC 9.6  RBC 4.93  HGB 15.0  HCT 45.5  MCV 92.3  MCH 30.4  MCHC 33.0  RDW 12.5  PLT 230    Cardiac Enzymes  Recent Labs  Lab 11/21/23 1445  TROPONINIHS 10      BNP    Component Value Date/Time   BNP 25.8 07/25/2021 1652    Lipids  Lab Results  Component Value Date   CHOL 135 01/02/2023   HDL 34.00 (L) 01/02/2023   LDLCALC 68 01/02/2023   TRIG 161.0 (H) 01/02/2023   CHOLHDL 4 01/02/2023    HbA1c  Lab Results  Component Value Date   HGBA1C 5.5 01/02/2023    Radiology    DG Chest Port 1 View Result Date: 11/21/2023 CLINICAL DATA:  Weakness EXAM: PORTABLE CHEST 1 VIEW COMPARISON:  07/25/2021 FINDINGS: The heart size and mediastinal contours are within normal limits. Both lungs are clear. The visualized skeletal structures are unremarkable. IMPRESSION: No active disease. Electronically Signed   By: Janeece Mechanic M.D.   On: 11/21/2023 17:25     Telemetry    RSR - Personally Reviewed  Patient Profile     58 y.o.  female with a hx of hypertension, paroxysmal atrial fibrillation s/p PVI in December 2021 - chronic eliquis , PSVT, and obesity, who was admitted from the office on 4/17 due to recurrent SVT.   Assessment & Plan    1.  PSVT:  Prior h/o SVT unsuccessfully managed w/ flecainide  resulting in discontinuation and plan for RFCA on 4/25.  Unfortunately, pt experienced significantly more SVT over the past 10 days, prompting office visit 4/17 and subsequent ED referral after she did not convert to sinus w/ adenosine  in the office.  She subsequently converted to sinus rhythm w/ IV diltiazem  in the ED, and this has since been transitioned to oral ? blocker.  Home dose of metoprolol  succinate now titrated to 100 mg bid.  No further SVT.  Feels well this AM.  Ok to discharge today.  B/c she is pending SVT ablation next Friday, she is aware that she will need to begin holding her ? blocker on Monday 4/21.  2.  PAF:  s/p  prior PVI in 2021.  No Afib this admission.  Cont ? blocker (see above) and eliquis .  Signed, Laneta Pintos, NP  11/22/2023, 10:03 AM    For questions or updates, please contact   Please consult [email or web address] for contact info under Cardiology/STEMI.

## 2023-11-22 NOTE — Progress Notes (Signed)
 Transition of Care [facility]) - Inpatient Brief Assessment   Patient Details  Name: Renika Camri Molloy MRN: [id number] Date of Birth: [birth year]  [facility] CM/SW Contact:    Auset Fritzler C Shaunda Tipping, RN Phone Number: 11/22/2023, 10:17 AM   Clinical Narrative: TOC continuing to follow patient's progress throughout discharge planning.   Transition of Care Asessment: Insurance and Status: Insurance coverage has been reviewed Patient has primary care physician: Yes   Prior level of function:: Independent Prior/Current Home Services: No current home services Social Drivers of Health Review: SDOH reviewed no interventions necessary Readmission risk has been reviewed: Yes Transition of care needs: no transition of care needs at this time

## 2023-11-22 NOTE — Discharge Summary (Signed)
 Physician Discharge Summary   Patient: Victoria Williamson MRN: [id number] DOB: 1966/01/26  Admit date:     11/21/2023  Discharge date: 11/22/2023  Discharge Physician: Burnard DELENA Cunning   PCP: Ada Patrcia HERO, MD   Recommendations at discharge:    Follow up with EP/Cardiology as scheduled next week for ablation Follow up with Primary Care Repeat CBC, CMP. Mg at follow up Follow up on HR control with increased metoprolol  dose  Discharge Diagnoses: Principal Problem:   Flutter-fibrillation (HCC) Active Problems:   Shortness of breath   Palpitations  Resolved Problems:   * No resolved hospital problems. *  Hospital Course: Victoria Williamson is a 58 y.o. female with medical history significant of paroxysmal SVT/paroxysmal a flutter on Eliquis , status post ablation 2021, HTN, chronic HFpEF, COPD, morbid obesity, sent from cardiologist office for evaluation of recurrent A-flutter.   Patient has history of paroxysmal SVT/A-flutter, has been controlled with metoprolol .  Recently, starting January-February, however due to work stress, patient started to feel episodes of palpitations.  Patient was instructed to increase metoprolol  dosage to 100 mg twice daily for rate control however her blood pressure dropped significantly and patient went back to take metoprolol  75 mg twice daily.  In addition, patient was scheduled to have ablation on April 25.  Today, patient went back to see cardiology for follow-up, but found the patient in rapid heart rate, patient was given adenosine  6 mg and 12 mg and EKG showed a flutter and patient sent to ED for further management.  Patient denies any chest pain shortness of breath cough.   ED Course: Afebrile, heart rate 130-150, blood pressure 120/75.  Chest x-ray showed no acute infiltrates.  Blood work showed K3.9, magnesium 2.0, BUN 8 creatinine 0.8 hemoglobin 15 WBC 10.6.   Patient was given Cardizem  bolus x 1 initially rhythm converted back to sinus however  soon went back to a flutter again.  Cardizem  drip started   Cardiology was consulted. HR's improved on Cardizem  drip. Metoprolol  dose was increased and HR's remain controlled today both at rest and with ambulation around the unit.  Patient is cleared by Cardiology, medically stable and requests discharge home today.  Assessment and Plan:  Paroxysmal SVT Hx of A-fib/flutter  - Treated with IV Cardizem  drip and HR's improved - Cardiology was consulted - Metoprolol  increased to 100 mg BID - Follow up next week with EP for scheduled ablation - Hold off losartan  to avoid hypotension - resume at follow up if BP will tolerate - Continue Eliquis    COPD Stable.  --Continue home regimen   Anxiety/depression Stable.  --Continue Abilify  and Remeron        Consultants: Cardiology Procedures performed: None  Disposition: Home Diet recommendation:  Cardiac diet DISCHARGE MEDICATION: Allergies as of 11/22/2023       Reactions   Codeine Nausea And Vomiting   Confusion and could not stay awake   Meloxicam Nausea Only   And stomach cramping   Mushroom Other (See Comments)   Paroxetine Hcl Other (See Comments)   Sleep all the time        Medication List     PAUSE taking these medications    losartan  50 MG tablet Wait to take this until your doctor or other care provider tells you to start again. Commonly known as: COZAAR  Take 1 tablet (50 mg total) by mouth daily.       STOP taking these medications    [employer] VIT D 5000 HIGH-POTENCY PO  mirtazapine  15 MG tablet Commonly known as: REMERON    topiramate 25 MG tablet Commonly known as: TOPAMAX   traMADol  50 MG tablet Commonly known as: ULTRAM    Wixela Inhub 250-50 MCG/ACT Aepb Generic drug: fluticasone -salmeterol       TAKE these medications    albuterol  108 (90 Base) MCG/ACT inhaler Commonly known as: VENTOLIN  HFA TAKE 2 PUFFS BY MOUTH EVERY 6 HOURS AS NEEDED FOR WHEEZE OR SHORTNESS OF BREATH    amitriptyline  50 MG tablet Commonly known as: ELAVIL  Take 50 mg by mouth at bedtime.   ARIPiprazole  10 MG tablet Commonly known as: ABILIFY  TAKE 1/2 TABLET BY MOUTH DAILY   benzonatate 100 MG capsule Commonly known as: TESSALON Take 100 mg by mouth 3 (three) times daily as needed for cough.   clobetasol  ointment 0.05 % Commonly known as: TEMOVATE  Apply to affected area every night for 4 weeks, then every other day for 4 weeks and then twice a week for 4 weeks or until resolution.   Eliquis  5 MG Tabs tablet Generic drug: apixaban  TAKE 1 TABLET BY MOUTH TWICE A DAY   fluticasone  50 MCG/ACT nasal spray Commonly known as: FLONASE  Place 1 spray into both nostrils daily.   loratadine  10 MG tablet Commonly known as: CLARITIN  Take 10 mg by mouth daily as needed.   metoprolol  succinate 100 MG 24 hr tablet Commonly known as: TOPROL -XL Take 1 tablet (100 mg total) by mouth 2 (two) times daily. Take with or immediately following a meal. What changed:  medication strength how much to take how to take this when to take this additional instructions   neomycin -polymyxin b-dexamethasone  3.5-10000-0.1 Susp Commonly known as: MAXITROL Place 2 drops into both eyes every 6 (six) hours.   pantoprazole  40 MG tablet Commonly known as: PROTONIX  TAKE 1 TABLET BY MOUTH EVERY DAY        Discharge Exam: Filed Weights   11/21/23 1446  Weight: 104.4 kg   General exam: awake, alert, no acute distress HEENT: atraumatic, clear conjunctiva, anicteric sclera, moist mucus membranes, hearing grossly normal  Respiratory system: CTAB, no wheezes, rales or rhonchi, normal respiratory effort. Cardiovascular system: normal S1/S2, RRR, no JVD, murmurs, rubs, gallops, no pedal edema.   Gastrointestinal system: soft, NT, ND, no HSM felt, +bowel sounds. Central nervous system: A&O x4. no gross focal neurologic deficits, normal speech Extremities: moves all, no edema, normal tone Skin: dry, intact,  normal temperature, normal color,No rashes, lesions or ulcers Psychiatry: normal mood, congruent affect, judgement and insight appear normal   Condition at discharge: stable  The results of significant diagnostics from this hospitalization (including imaging, microbiology, ancillary and laboratory) are listed below for reference.   Imaging Studies: DG Chest Port 1 View Result Date: 11/21/2023 CLINICAL DATA:  Weakness EXAM: PORTABLE CHEST 1 VIEW COMPARISON:  07/25/2021 FINDINGS: The heart size and mediastinal contours are within normal limits. Both lungs are clear. The visualized skeletal structures are unremarkable. IMPRESSION: No active disease. Electronically Signed   By: Franky Crease M.D.   On: 11/21/2023 17:25   ECHOCARDIOGRAM COMPLETE Result Date: 10/29/2023    ECHOCARDIOGRAM REPORT   Patient Name:   Victoria Williamson Date of Exam: 10/29/2023 Medical Rec #:  [id number]           Height:       64.0 in Accession #:    [id number]          Weight:       232.0 lb Date of Birth:  July 17, 1966  BSA:          2.084 m Patient Age:    57 years            BP:           111/67 mmHg Patient Gender: F                   HR:           80 bpm. Exam Location:  [facility] Procedure: 2D Echo, Cardiac Doppler, Color Doppler and Strain Analysis (Both            Spectral and Color Flow Doppler were utilized during procedure). Indications:    I48.91* Unspeicified atrial fibrillation  History:        Patient has prior history of Echocardiogram examinations, most                 recent 07/27/2021. CHF, Abnormal ECG, Arrythmias:Atrial                 Fibrillation and Tachycardia; Risk Factors:Hypertension and                 Former Smoker.  Sonographer:    Doyal Point MHA, BS, RDCS Referring Phys: [id number] OLE ONEIDA HOLTS  Sonographer Comments: Patient is obese. Image acquisition challenging due to patient body habitus. IMPRESSIONS  1. Left ventricular ejection fraction, by estimation, is 65 to 70%. The left  ventricle has normal function. The left ventricle has no regional wall motion abnormalities. Left ventricular diastolic parameters are consistent with Grade II diastolic dysfunction (pseudonormalization). The average left ventricular global longitudinal strain is -19.3 %. The global longitudinal strain is normal.  2. Right ventricular systolic function is normal. The right ventricular size is normal. There is normal pulmonary artery systolic pressure. The estimated right ventricular systolic pressure is 20.2 mmHg.  3. Left atrial size was mildly dilated.  4. The mitral valve is normal in structure. Mild mitral valve regurgitation. No evidence of mitral stenosis.  5. The aortic valve is normal in structure. Aortic valve regurgitation is not visualized. No aortic stenosis is present.  6. The inferior vena cava is normal in size with greater than 50% respiratory variability, suggesting right atrial pressure of 3 mmHg. FINDINGS  Left Ventricle: Left ventricular ejection fraction, by estimation, is 65 to 70%. The left ventricle has normal function. The left ventricle has no regional wall motion abnormalities. The average left ventricular global longitudinal strain is -19.3 %. Strain was performed and the global longitudinal strain is normal. The left ventricular internal cavity size was normal in size. There is no left ventricular hypertrophy. Left ventricular diastolic parameters are consistent with Grade II diastolic dysfunction (pseudonormalization). Right Ventricle: The right ventricular size is normal. No increase in right ventricular wall thickness. Right ventricular systolic function is normal. There is normal pulmonary artery systolic pressure. The tricuspid regurgitant velocity is 1.95 m/s, and  with an assumed right atrial pressure of 5 mmHg, the estimated right ventricular systolic pressure is 20.2 mmHg. Left Atrium: Left atrial size was mildly dilated. Right Atrium: Right atrial size was normal in size.  Pericardium: There is no evidence of pericardial effusion. Mitral Valve: The mitral valve is normal in structure. Mild mitral valve regurgitation. No evidence of mitral valve stenosis. Tricuspid Valve: The tricuspid valve is normal in structure. Tricuspid valve regurgitation is mild . No evidence of tricuspid stenosis. Aortic Valve: The aortic valve is normal in structure. Aortic valve regurgitation is not visualized. No aortic stenosis  is present. Aortic valve mean gradient measures 5.0 mmHg. Aortic valve peak gradient measures 9.1 mmHg. Aortic valve area, by VTI measures 2.76 cm. Pulmonic Valve: The pulmonic valve was normal in structure. Pulmonic valve regurgitation is not visualized. No evidence of pulmonic stenosis. Aorta: The aortic root is normal in size and structure. Venous: The inferior vena cava is normal in size with greater than 50% respiratory variability, suggesting right atrial pressure of 3 mmHg. IAS/Shunts: No atrial level shunt detected by color flow Doppler. Additional Comments: 3D was performed not requiring image post processing on an independent workstation and was indeterminate.  LEFT VENTRICLE PLAX 2D LVIDd:         4.60 cm   Diastology LVIDs:         2.40 cm   LV e' medial:    6.64 cm/s LV PW:         1.00 cm   LV E/e' medial:  16.9 LV IVS:        1.10 cm   LV e' lateral:   5.33 cm/s LVOT diam:     2.00 cm   LV E/e' lateral: 21.0 LV SV:         84 LV SV Index:   40        2D Longitudinal Strain LVOT Area:     3.14 cm  2D Strain GLS Avg:     -19.3 %  RIGHT VENTRICLE RV S prime:     14.50 cm/s TAPSE (M-mode): 2.5 cm LEFT ATRIUM             Index LA diam:        3.90 cm 1.87 cm/m LA Vol (A2C):   45.2 ml 21.69 ml/m LA Vol (A4C):   76.3 ml 36.62 ml/m LA Biplane Vol: 60.0 ml 28.79 ml/m  AORTIC VALVE AV Area (Vmax):    2.70 cm AV Area (Vmean):   2.70 cm AV Area (VTI):     2.76 cm AV Vmax:           151.00 cm/s AV Vmean:          100.000 cm/s AV VTI:            0.305 m AV Peak Grad:       9.1 mmHg AV Mean Grad:      5.0 mmHg LVOT Vmax:         130.00 cm/s LVOT Vmean:        85.900 cm/s LVOT VTI:          0.268 m LVOT/AV VTI ratio: 0.88  AORTA Ao Root diam: 2.70 cm Ao Asc diam:  3.80 cm MITRAL VALVE                TRICUSPID VALVE MV Area (PHT): 3.99 cm     TR Peak grad:   15.2 mmHg MV Decel Time: 190 msec     TR Vmax:        195.00 cm/s MV E velocity: 112.00 cm/s MV A velocity: 99.00 cm/s   SHUNTS MV E/A ratio:  1.13         Systemic VTI:  0.27 m                             Systemic Diam: 2.00 cm Evalene Lunger MD Electronically signed by Evalene Lunger MD Signature Date/Time: 10/29/2023/7:05:53 PM    Final     Microbiology: Results for orders placed or performed  during the hospital encounter of 07/25/21  Resp Panel by RT-PCR (Flu A&B, Covid) Nasopharyngeal Swab     Status: None   Collection Time: 07/25/21  4:52 PM   Specimen: Nasopharyngeal Swab; Nasopharyngeal(NP) swabs in vial transport medium  Result Value Ref Range Status   SARS Coronavirus 2 by RT PCR NEGATIVE NEGATIVE Final    Comment: (NOTE) SARS-CoV-2 target nucleic acids are NOT DETECTED.  The SARS-CoV-2 RNA is generally detectable in upper respiratory specimens during the acute phase of infection. The lowest concentration of SARS-CoV-2 viral copies this assay can detect is 138 copies/mL. A negative result does not preclude SARS-Cov-2 infection and should not be used as the sole basis for treatment or other patient management decisions. A negative result may occur with  improper specimen collection/handling, submission of specimen other than nasopharyngeal swab, presence of viral mutation(s) within the areas targeted by this assay, and inadequate number of viral copies(<138 copies/mL). A negative result must be combined with clinical observations, patient history, and epidemiological information. The expected result is Negative.  Fact Sheet for Patients:  bloggercourse.com  Fact Sheet for  Healthcare Providers:  seriousbroker.it  This test is no t yet approved or cleared by the [place]  FDA and  has been authorized for detection and/or diagnosis of SARS-CoV-2 by FDA under an Emergency Use Authorization (EUA). This EUA will remain  in effect (meaning this test can be used) for the duration of the COVID-19 declaration under Section 564(b)(1) of the Act, 21 U.S.C.section 360bbb-3(b)(1), unless the authorization is terminated  or revoked sooner.       Influenza A by PCR NEGATIVE NEGATIVE Final   Influenza B by PCR NEGATIVE NEGATIVE Final    Comment: (NOTE) The Xpert Xpress SARS-CoV-2/FLU/RSV plus assay is intended as an aid in the diagnosis of influenza from Nasopharyngeal swab specimens and should not be used as a sole basis for treatment. Nasal washings and aspirates are unacceptable for Xpert Xpress SARS-CoV-2/FLU/RSV testing.  Fact Sheet for Patients: bloggercourse.com  Fact Sheet for Healthcare Providers: seriousbroker.it  This test is not yet approved or cleared by the [place]  FDA and has been authorized for detection and/or diagnosis of SARS-CoV-2 by FDA under an Emergency Use Authorization (EUA). This EUA will remain in effect (meaning this test can be used) for the duration of the COVID-19 declaration under Section 564(b)(1) of the Act, 21 U.S.C. section 360bbb-3(b)(1), unless the authorization is terminated or revoked.  Performed at [facility], [address] [place], [place] [address]     Labs: CBC: Recent Labs  Lab 11/21/23 1445  WBC 9.6  HGB 15.0  HCT 45.5  MCV 92.3  PLT 230   Basic Metabolic Panel: Recent Labs  Lab 11/21/23 1445  NA 139  K 3.9  CL 104  CO2 26  GLUCOSE 100*  BUN 8  CREATININE 0.86  CALCIUM 9.4  MG 2.1   Liver Function Tests: Recent Labs  Lab 11/21/23 1445  AST 39  ALT 49*  ALKPHOS 81  BILITOT 0.6  PROT 8.1   ALBUMIN 4.2   CBG: No results for input(s): GLUCAP in the last 168 hours.  Discharge time spent: less than 30 minutes.  Signed: Burnard DELENA Cunning, DO [facility] 11/22/2023

## 2023-11-22 NOTE — Progress Notes (Signed)
   [facility] [address] [place], [place] [address] [phone]   To whom it may concern,  Please excuse Victoria Williamson from work from 11/18/2023 through 12/01/2023.  Sincerely,    Laneta Pintos, NP [facility]

## 2023-11-22 NOTE — Progress Notes (Signed)
 Pt ambulated around unit with nurse , HR up to 88. No s/s of distress or WOB. Pt tolerated it well. Anders Katz 11/22/23 10:21 AM

## 2023-11-25 ENCOUNTER — Telehealth (HOSPITAL_COMMUNITY): Payer: Self-pay

## 2023-11-25 ENCOUNTER — Encounter: Payer: Self-pay | Admitting: Cardiology

## 2023-11-25 NOTE — Telephone Encounter (Signed)
 Call placed to patient to discuss upcoming procedure.   No CT needed per Dr. Marven Slimmer Labs: completed.   Any recent signs of acute illness or been started on antibiotics? Pt had ED visit- admitted on 4/17-4/18/25 for SVT.  Any new medications started? Metoprolol  increased to 100 mg twice daily Any medications to hold? Metoprolol - last dose today. Will make Dr. Marven Slimmer aware patient will have only held medication 3 days vs. 5 days as previously instructed.  Any missed doses of blood thinner?  No Advised patient to continue taking ANTICOAGULANT: Eliquis  (Apixaban ) twice daily without missing any doses.  Medication instructions:  On the morning of your procedure DO NOT take any medication., including Eliquis  or the procedure may be rescheduled. Nothing to eat or drink after midnight prior to your procedure.  Confirmed patient is scheduled for Atrial Fibrillation Ablation on Friday, April 25 with Dr. Harvie Liner. Instructed patient to arrive at the Main Entrance A at [facility]: [address] [place], [place] [address] and check in at Admitting at 12:30 PM.  Advised of plan to go home the same day and will only stay overnight if medically necessary. You MUST have a responsible adult to drive you home and MUST be with you the first 24 hours after you arrive home or your procedure could be cancelled.  Patient verbalized understanding to all instructions provided and agreed to proceed with procedure.

## 2023-11-26 ENCOUNTER — Encounter: Payer: Self-pay | Admitting: *Deleted

## 2023-11-26 NOTE — Telephone Encounter (Signed)
 See MyChart message. Patient was given work note until her procedure.

## 2023-11-26 NOTE — Telephone Encounter (Signed)
 Per Dr. Marven Slimmer, Yes. Ok to continue w procedure.   Patient made aware of provider's response and verbalized understanding and appreciation for the call.

## 2023-11-26 NOTE — Telephone Encounter (Signed)
 Can you help with this? A note is fine until her procedure since she is so symptomatic to her atrial flutter/atrial fibrillation/SVT

## 2023-11-28 ENCOUNTER — Encounter: Payer: Self-pay | Admitting: Cardiology

## 2023-11-28 NOTE — Pre-Procedure Instructions (Signed)
 Instructed patient on the following items: Arrival time 1200 Nothing to eat or drink after midnight No meds AM of procedure Responsible person to drive you home and stay with you for 24 hrs  Have you missed any doses of anti-coagulant Eliquis - takes twice a day, hasn't missed any doses in last 4 weeks.  Don't miss dose morning of procedure.

## 2023-11-29 ENCOUNTER — Ambulatory Visit (HOSPITAL_COMMUNITY)
Admission: RE | Admit: 2023-11-29 | Discharge: 2023-11-30 | Disposition: A | Discharge status: Home / Self Care | Payer: PRIVATE HEALTH INSURANCE | Attending: Cardiology | Admitting: Cardiology

## 2023-11-29 ENCOUNTER — Ambulatory Visit (HOSPITAL_BASED_OUTPATIENT_CLINIC_OR_DEPARTMENT_OTHER): Payer: PRIVATE HEALTH INSURANCE | Admitting: Certified Registered Nurse Anesthetist

## 2023-11-29 ENCOUNTER — Ambulatory Visit (HOSPITAL_COMMUNITY)
Admission: RE | Disposition: A | Discharge status: Home / Self Care | Payer: Self-pay | Source: Home / Self Care | Attending: Cardiology

## 2023-11-29 ENCOUNTER — Other Ambulatory Visit: Payer: Self-pay

## 2023-11-29 ENCOUNTER — Ambulatory Visit (HOSPITAL_COMMUNITY): Payer: Self-pay | Admitting: Certified Registered Nurse Anesthetist

## 2023-11-29 DIAGNOSIS — I77819 Aortic ectasia, unspecified site: Secondary | ICD-10-CM | POA: Diagnosis not present

## 2023-11-29 DIAGNOSIS — I483 Typical atrial flutter: Secondary | ICD-10-CM | POA: Insufficient documentation

## 2023-11-29 DIAGNOSIS — Z87891 Personal history of nicotine dependence: Secondary | ICD-10-CM

## 2023-11-29 DIAGNOSIS — I4819 Other persistent atrial fibrillation: Secondary | ICD-10-CM | POA: Insufficient documentation

## 2023-11-29 DIAGNOSIS — I4891 Unspecified atrial fibrillation: Secondary | ICD-10-CM | POA: Diagnosis present

## 2023-11-29 DIAGNOSIS — Z7901 Long term (current) use of anticoagulants: Secondary | ICD-10-CM | POA: Diagnosis not present

## 2023-11-29 DIAGNOSIS — I10 Essential (primary) hypertension: Secondary | ICD-10-CM

## 2023-11-29 DIAGNOSIS — I471 Supraventricular tachycardia, unspecified: Secondary | ICD-10-CM | POA: Diagnosis not present

## 2023-11-29 DIAGNOSIS — J449 Chronic obstructive pulmonary disease, unspecified: Secondary | ICD-10-CM | POA: Diagnosis not present

## 2023-11-29 HISTORY — PX: ATRIAL FIBRILLATION ABLATION: EP1191

## 2023-11-29 LAB — POCT ACTIVATED CLOTTING TIME: Activated Clotting Time: 366 s

## 2023-11-29 MED ORDER — ONDANSETRON HCL 4 MG/2ML IJ SOLN: 4.0000 mg | Freq: Four times a day (QID) | INTRAMUSCULAR | Status: DC | PRN

## 2023-11-29 MED ORDER — FENTANYL CITRATE (PF) 100 MCG/2ML IJ SOLN
INTRAMUSCULAR | Status: AC
  Filled 2023-11-29: qty 2

## 2023-11-29 MED ORDER — PROPOFOL 10 MG/ML IV BOLUS
INTRAVENOUS | Status: DC | PRN
  Administered 2023-11-29: 120 mg via INTRAVENOUS

## 2023-11-29 MED ORDER — ROCURONIUM BROMIDE 10 MG/ML (PF) SYRINGE
PREFILLED_SYRINGE | INTRAVENOUS | Status: DC | PRN
  Administered 2023-11-29: 20 mg via INTRAVENOUS
  Administered 2023-11-29: 10 mg via INTRAVENOUS
  Administered 2023-11-29: 50 mg via INTRAVENOUS

## 2023-11-29 MED ORDER — SODIUM CHLORIDE 0.9 % IV SOLN: INTRAVENOUS | Status: DC

## 2023-11-29 MED ORDER — SODIUM CHLORIDE 0.9 % IV SOLN: 250.0000 mL | INTRAVENOUS | Status: DC | PRN

## 2023-11-29 MED ORDER — ONDANSETRON HCL 4 MG/2ML IJ SOLN
INTRAMUSCULAR | Status: DC | PRN
Start: 2023-11-29 — End: 2023-11-29
  Administered 2023-11-29: 4 mg via INTRAVENOUS

## 2023-11-29 MED ORDER — MIDAZOLAM HCL 2 MG/2ML IJ SOLN
INTRAMUSCULAR | Status: DC | PRN
  Administered 2023-11-29: 2 mg via INTRAVENOUS

## 2023-11-29 MED ORDER — SUGAMMADEX SODIUM 200 MG/2ML IV SOLN
INTRAVENOUS | Status: DC | PRN
  Administered 2023-11-29: 208.6 mg via INTRAVENOUS

## 2023-11-29 MED ORDER — COLCHICINE 0.6 MG PO TABS
0.6000 mg | ORAL_TABLET | Freq: Two times a day (BID) | ORAL | Status: DC
  Administered 2023-11-29: 0.6 mg via ORAL
  Filled 2023-11-29: qty 1

## 2023-11-29 MED ORDER — PANTOPRAZOLE SODIUM 40 MG PO TBEC
40.0000 mg | DELAYED_RELEASE_TABLET | Freq: Every day | ORAL | Status: DC
  Administered 2023-11-29: 40 mg via ORAL
  Filled 2023-11-29: qty 1

## 2023-11-29 MED ORDER — OXYCODONE HCL 5 MG PO TABS: 5.0000 mg | ORAL_TABLET | Freq: Once | ORAL | Status: DC | PRN

## 2023-11-29 MED ORDER — SODIUM CHLORIDE 0.9% FLUSH: 3.0000 mL | INTRAVENOUS | Status: DC | PRN

## 2023-11-29 MED ORDER — ATROPINE SULFATE 1 MG/ML IV SOLN
INTRAVENOUS | Status: DC | PRN
Start: 2023-11-29 — End: 2023-11-29
  Administered 2023-11-29: 1 mg via INTRAVENOUS

## 2023-11-29 MED ORDER — ACETAMINOPHEN 325 MG PO TABS: 650.0000 mg | ORAL_TABLET | ORAL | Status: DC | PRN

## 2023-11-29 MED ORDER — FENTANYL CITRATE (PF) 100 MCG/2ML IJ SOLN
25.0000 ug | INTRAMUSCULAR | Status: DC | PRN
  Administered 2023-11-29: 50 ug via INTRAVENOUS

## 2023-11-29 MED ORDER — DEXAMETHASONE SODIUM PHOSPHATE 10 MG/ML IJ SOLN
INTRAMUSCULAR | Status: DC | PRN
  Administered 2023-11-29: 10 mg via INTRAVENOUS

## 2023-11-29 MED ORDER — HEPARIN (PORCINE) IN NACL 1000-0.9 UT/500ML-% IV SOLN
INTRAVENOUS | Status: DC | PRN
  Administered 2023-11-29 (×3): 500 mL

## 2023-11-29 MED ORDER — PROPOFOL 500 MG/50ML IV EMUL
INTRAVENOUS | Status: DC | PRN
  Administered 2023-11-29: 150 ug/kg/min via INTRAVENOUS

## 2023-11-29 MED ORDER — PROTAMINE SULFATE 10 MG/ML IV SOLN
INTRAVENOUS | Status: DC | PRN
  Administered 2023-11-29: 5 mg via INTRAVENOUS
  Administered 2023-11-29: 30 mg via INTRAVENOUS

## 2023-11-29 MED ORDER — PHENYLEPHRINE HCL-NACL 20-0.9 MG/250ML-% IV SOLN
INTRAVENOUS | Status: DC | PRN
  Administered 2023-11-29: 30 ug/min via INTRAVENOUS

## 2023-11-29 MED ORDER — MIDAZOLAM HCL 2 MG/2ML IJ SOLN
INTRAMUSCULAR | Status: AC
  Filled 2023-11-29: qty 2

## 2023-11-29 MED ORDER — HEPARIN SODIUM (PORCINE) 1000 UNIT/ML IJ SOLN
INTRAMUSCULAR | Status: AC
  Filled 2023-11-29: qty 10

## 2023-11-29 MED ORDER — OXYCODONE HCL 5 MG/5ML PO SOLN
5.0000 mg | Freq: Once | ORAL | Status: DC | PRN
  Filled 2023-11-29: qty 5

## 2023-11-29 MED ORDER — FENTANYL CITRATE (PF) 250 MCG/5ML IJ SOLN
INTRAMUSCULAR | Status: DC | PRN
  Administered 2023-11-29 (×2): 50 ug via INTRAVENOUS

## 2023-11-29 MED ORDER — SODIUM CHLORIDE 0.9% FLUSH: 3.0000 mL | Freq: Two times a day (BID) | INTRAVENOUS | Status: DC

## 2023-11-29 MED ORDER — METOPROLOL SUCCINATE ER 100 MG PO TB24
100.0000 mg | ORAL_TABLET | Freq: Two times a day (BID) | ORAL | Status: DC
  Administered 2023-11-29: 100 mg via ORAL
  Filled 2023-11-29: qty 1

## 2023-11-29 MED ORDER — HEPARIN SODIUM (PORCINE) 1000 UNIT/ML IJ SOLN
INTRAMUSCULAR | Status: DC | PRN
  Administered 2023-11-29: 16000 [IU] via INTRAVENOUS

## 2023-11-29 MED ORDER — ARIPIPRAZOLE 5 MG PO TABS
5.0000 mg | ORAL_TABLET | Freq: Every day | ORAL | Status: DC
  Filled 2023-11-29: qty 1

## 2023-11-29 MED ORDER — APIXABAN 5 MG PO TABS
5.0000 mg | ORAL_TABLET | Freq: Two times a day (BID) | ORAL | Status: DC
  Administered 2023-11-29: 5 mg via ORAL
  Filled 2023-11-29: qty 1

## 2023-11-29 NOTE — Discharge Instructions (Signed)

## 2023-11-29 NOTE — H&P (Signed)
 Electrophysiology Office Follow up Visit Note:     Date:  11/29/2023    ID:  Victoria Williamson Reason, DOB March 14, 1966, MRN [id number]   PCP:  Dorothe Gaster, NP     [facility] Cardiologist:  Constancia Delton, MD  [facility] Electrophysiologist:  Boyce Byes, MD      Interval History:       Victoria Williamson is a 58 y.o. female who presents for a follow up visit.    She had a catheter ablation for atrial fibrillation July 15, 2020. During that procedure, the pulmonary veins and CTI were ablated. I last saw the patient Dec 27, 2021.  At that appointment, the patient reported SVT that was different than her prior A-fib episodes.  We considered antiarrhythmic drugs in the past but these were going to interact with her Abilify  and mirtazapine  so were not started.  She was taking metoprolol  and was doing well without sustained recurrence of arrhythmia.  She saw Dr. Junnie Olives in March 2024.  At that appointment she was still taking Eliquis  for stroke prophylaxis.   She is doing well. She brought in HR logs from her fit bit showing bursts of tachycardia up to 140-160s bpm. She is symptomatic during these episodes.  Presents for AF ablation today. Procedure reviewed.  Objective Past medical, surgical, social and family history were reviewed.   ROS:   Please see the history of present illness.    All other systems reviewed and are negative.   EKGs/Labs/Other Studies Reviewed:     The following studies were reviewed today:   Fitbit data reviewed today.           Physical Exam:     VS:  BP 130/84 (BP Location: Left Arm, Patient Position: Sitting, Cuff Size: Normal)   Pulse 99   Ht 5\' 4"  (1.626 m)   Wt 232 lb (105.2 kg)   SpO2 96%   BMI 39.82 kg/m         Wt Readings from Last 3 Encounters:  10/02/23 232 lb (105.2 kg)  01/02/23 232 lb 6 oz (105.4 kg)  10/24/22 230 lb 12.8 oz (104.7 kg)      GEN: no distress CARD: RRR, No MRG RESP: No IWOB. CTAB.      Assessment ASSESSMENT:     1. PAF (paroxysmal atrial fibrillation) (HCC)   2. SVT (supraventricular tachycardia) (HCC)   3. Primary hypertension     PLAN:     In order of problems listed above:   #Atrial fibrillation #Atrial flutter #SVT The patient has a history of supraventricular arrhythmias and atrial fibrillation.  She had a prior catheter ablation and did well for some time but then developed SVT.  Originally this was thought to be all secondary to a COVID infection but she is continued having symptoms with rates significantly greater than 100 bpm.  These are also symptomatic.  Treatment strategies have been discussed with the patient including antiarrhythmic drugs, redo catheter ablation and conservative management.  Invasive therapy would include redo catheter ablation of atrial fibrillation and EP study to assess for inducible SVT.  I have discussed the treatment options in detail with the patient and she wishes to proceed with redo catheter ablation which I think is very reasonable.   She will need to hold her metoprolol  for 5 days prior to catheter ablation.   Discussed treatment options today for AF, atrial flutter and SVT including antiarrhythmic drug therapy and ablation. Discussed risks, recovery and likelihood of  success with each treatment strategy. Risk, benefits, and alternatives to EP study and ablation for afib and SVT were discussed. These risks include but are not limited to stroke, bleeding, vascular damage, tamponade, perforation, damage to the esophagus, lungs, phrenic nerve and other structures, pulmonary vein stenosis, worsening renal function, coronary vasospasm and death.  Discussed potential need for repeat ablation procedures and antiarrhythmic drugs after an initial ablation. The patient understands these risk and wishes to proceed.  We will therefore proceed with catheter ablation at the next available time.  Carto, ICE, anesthesia are requested for the  procedure.  Will also obtain CT PV protocol prior to the procedure to exclude LAA thrombus and further evaluate atrial anatomy.   #Hypertension At goal today.  Recommend checking blood pressures 1-2 times per week at home and recording the values.  Recommend bringing these recordings to the primary care physician.   #Dilated ascending aorta Continue with annual follow up with general cardiology.    Presents for AF ablation today. Procedure reviewed.     Signed, Victoria Liner, MD, [facility], [facility] 11/29/2023 Electrophysiology [facility]

## 2023-11-29 NOTE — Anesthesia Preprocedure Evaluation (Signed)
 Anesthesia Evaluation  Patient identified by MRN, date of birth, ID band Patient awake    Reviewed: Allergy & Precautions, NPO status , Patient's Chart, lab work & pertinent test results  History of Anesthesia Complications (+) PONV and history of anesthetic complications  Airway Mallampati: II  TM Distance: >3 FB Neck ROM: Full    Dental no notable dental hx.    Pulmonary COPD, former smoker   Pulmonary exam normal        Cardiovascular hypertension, + Peripheral Vascular Disease, +CHF and + DOE  + dysrhythmias Atrial Fibrillation  Rhythm:Regular Rate:Normal     Neuro/Psych  PSYCHIATRIC DISORDERS Anxiety Depression Bipolar Disorder      GI/Hepatic Neg liver ROS,GERD  Medicated,,  Endo/Other  negative endocrine ROS    Renal/GU negative Renal ROS  Female GU complaint     Musculoskeletal  (+) Arthritis , Osteoarthritis,    Abdominal Normal abdominal exam  (+)   Peds  Hematology negative hematology ROS (+)   Anesthesia Other Findings   Reproductive/Obstetrics                              Anesthesia Physical Anesthesia Plan  ASA: 3  Anesthesia Plan: General   Post-op Pain Management:    Induction: Intravenous  PONV Risk Score and Plan: 4 or greater and Ondansetron , Dexamethasone , Midazolam  and Treatment may vary due to age or medical condition  Airway Management Planned: Oral ETT  Additional Equipment: None  Intra-op Plan:   Post-operative Plan: Extubation in OR  Informed Consent: I have reviewed the patients History and Physical, chart, labs and discussed the procedure including the risks, benefits and alternatives for the proposed anesthesia with the patient or authorized representative who has indicated his/her understanding and acceptance.     Dental advisory given  Plan Discussed with: CRNA  Anesthesia Plan Comments:         Anesthesia Quick Evaluation

## 2023-11-29 NOTE — Transfer of Care (Signed)
 Immediate Anesthesia Transfer of Care Note  Patient: Andraya Levorn Reason  Procedure(s) Performed: ATRIAL FIBRILLATION ABLATION  Patient Location: PACU and Cath Lab  Anesthesia Type:General  Level of Consciousness: awake, alert , and oriented  Airway & Oxygen Therapy: Patient Spontanous Breathing and Patient connected to face mask oxygen  Post-op Assessment: Report given to RN and Post -op Vital signs reviewed and stable  Post vital signs: Reviewed and stable  Last Vitals:  Vitals Value Taken Time  BP 108/65 11/29/23 1704  Temp    Pulse 93 11/29/23 1705  Resp 21 11/29/23 1705  SpO2 96 % 11/29/23 1705  Vitals shown include unfiled device data.  Last Pain:  Vitals:   11/29/23 1205  TempSrc: Oral  PainSc: 0-No pain         Complications: There were no known notable events for this encounter.

## 2023-11-29 NOTE — Anesthesia Procedure Notes (Signed)
 Procedure Name: Intubation Date/Time: 11/29/2023 3:10 PM  Performed by: Andee Bamberger, CRNAPre-anesthesia Checklist: Patient identified, Emergency Drugs available, Suction available and Patient being monitored Patient Re-evaluated:Patient Re-evaluated prior to induction Oxygen Delivery Method: Circle system utilized Preoxygenation: Pre-oxygenation with 100% oxygen Induction Type: IV induction Ventilation: Mask ventilation without difficulty Laryngoscope Size: Mac and 3 Grade View: Grade II Tube type: Oral Tube size: 7.0 mm Number of attempts: 2 Airway Equipment and Method: Stylet and Oral airway Placement Confirmation: ETT inserted through vocal cords under direct vision, positive ETCO2 and breath sounds checked- equal and bilateral Tube secured with: Tape Dental Injury: Teeth and Oropharynx as per pre-operative assessment

## 2023-11-30 DIAGNOSIS — I4819 Other persistent atrial fibrillation: Secondary | ICD-10-CM

## 2023-11-30 MED ORDER — COLCHICINE 0.6 MG PO TABS: 0.6000 mg | ORAL_TABLET | Freq: Two times a day (BID) | ORAL | 0 refills | Status: DC

## 2023-11-30 NOTE — Progress Notes (Signed)
 Patient seen and examined today.  She is post atrial fibrillation ablation.  She remains in sinus rhythm.  No acute issues overnight.  Victoria Williamson plan for discharge today.  [name] need colchicine  and Protonix  per Dr. Candace Cerise procedure note.  GEN: No acute distress.   Neck: No JVD Cardiac: RRR, no murmurs, rubs, or gallops.  Respiratory: normal BS bases bilaterally. GI: Soft, nontender, non-distended  MS: No edema; No deformity. Neuro:  Nonfocal  Skin: warm and dry Psych: Normal affect   Shanara Schnieders, MD

## 2023-11-30 NOTE — Progress Notes (Signed)
 Verbalized understanding of dc instructions. All belongings given to patient.

## 2023-11-30 NOTE — Anesthesia Postprocedure Evaluation (Signed)
 Anesthesia Post Note  Patient: Victoria Williamson  Procedure(s) Performed: ATRIAL FIBRILLATION ABLATION     Patient location during evaluation: PACU Anesthesia Type: General Level of consciousness: awake and alert Pain management: pain level controlled Vital Signs Assessment: post-procedure vital signs reviewed and stable Respiratory status: spontaneous breathing, nonlabored ventilation, respiratory function stable and patient connected to nasal cannula oxygen Cardiovascular status: blood pressure returned to baseline and stable Postop Assessment: no apparent nausea or vomiting Anesthetic complications: no   There were no known notable events for this encounter.  Last Vitals:  Vitals:   11/30/23 0335 11/30/23 0741  BP: 116/67 (!) 103/58  Pulse: 81 82  Resp: 16 15  Temp: 36.6 C 36.9 C  SpO2: 96%     Last Pain:  Vitals:   11/30/23 0741  TempSrc: Oral  PainSc:                  [name] Virdie Penning

## 2023-11-30 NOTE — Plan of Care (Signed)

## 2023-11-30 NOTE — Discharge Summary (Addendum)
 Discharge Summary    Patient ID: Victoria Williamson MRN: [id number]; DOB: [birth year]  Admit date: 11/29/2023 Discharge date: 11/30/2023  PCP:  Luise Saint, MD   [facility] Providers Cardiologist:  Constancia Delton, MD  Electrophysiologist:  Boyce Byes, MD       Discharge Diagnoses    Principal Problem:   Atrial fibrillation [facility])   Diagnostic Studies/Procedures    ATRIAL FIBRILLATION ABLATION 11/29/23 1. Pulmonary vein re-isolation (wide antral circumferential lesion set) 2. Posterior wall ablation and isolation  3. Cavotricuspid isthmus ablation for typical atrial flutter 4. Three-dimensional electroanatomic mapping of atrial fibrillation 5. Intracardiac echocardiography 6. Transseptal puncture of an intact septum. 7. Comprehensive EP study  _____________   History of Present Illness     Victoria Williamson is a 58 y.o. female with a history of paroxysmal atrial fibrillation/flutter status post PVI/CTI ablation in 2021, SVT, dilated ascending aorta, hypertension.  She did well for some time after PVI ablation.  However, she has recently developed symptomatic SVT and saw Dr. Marven Slimmer.  Antiarrhythmic drugs versus redo catheter ablation were discussed.  It was ultimately decided to proceed with redo catheter ablation.  In the interim, she was admitted earlier this month to [facility] with recurrent SVT.  She converted to sinus rhythm with adenosine  and diltiazem .  Hospital Course     Consultants: none   She presented to the hospital on 11/29/2023 and underwent successful redo PVI ablation, successful ablation/isolation of the posterior wall and redo ablation of typical atrial flutter along the cavotricuspid isthmus.  Intracardiac echo demonstrated trivial pericardial effusion.  It was recommended she go home on colchicine  0.6 mg twice daily for 5 days and Protonix  40 mg daily for 5 days (confirmed with Dr. Lawana Pray).  She was seen this morning by Dr. Lawana Pray  and has remained in sinus rhythm.  She is felt to be stable and ready for discharge to home.  The patient has a follow-up with Suzann Riddle, NP 12/27/2023 at 1:30 PM.     Did the patient have an acute coronary syndrome (MI, NSTEMI, STEMI, etc) this admission?:  No                               Did the patient have a percutaneous coronary intervention (stent / angioplasty)?:  No.      _____________  Discharge Vitals Blood pressure (!) 103/58, pulse 82, temperature 98.4 F (36.9 C), temperature source Oral, resp. rate 15, height 5\' 4"  (1.626 m), weight 104.3 kg, SpO2 96%.  Filed Weights   11/29/23 1205  Weight: 104.3 kg    Labs & Radiologic Studies  _____________    Disposition   Pt is being discharged home today in good condition.  Follow-up Plans & Appointments     Follow-up Information     Riddle, Suzann, NP Follow up on 12/27/2023.   Specialty: Cardiology Why: Post procedure follow up at 1:30 pm. Please arrive 15 minutes early and bring all of your medications. Contact information: [address] [place] [address] [phone]                Discharge Instructions     Diet - low sodium heart healthy   Complete by: As directed    Increase activity slowly   Complete by: As directed    Other Restrictions   Complete by: As directed    See patient instructions for restrictions  Discharge Medications   Allergies as of 11/30/2023       Reactions   Codeine Nausea And Vomiting   Confusion and could not stay awake   Meloxicam Nausea Only   And stomach cramping   Mushroom Diarrhea   Paroxetine Hcl Other (See Comments)   Sleep all the time        Medication List     PAUSE taking these medications    losartan  50 MG tablet Wait to take this until your doctor or other care provider tells you to start again. Commonly known as: COZAAR  Take 1 tablet (50 mg total) by mouth daily.       TAKE these medications    ARIPiprazole  5  MG tablet Commonly known as: ABILIFY  Take 5 mg by mouth daily.   clobetasol  ointment 0.05 % Commonly known as: TEMOVATE  Apply to affected area every night for 4 weeks, then every other day for 4 weeks and then twice a week for 4 weeks or until resolution.   colchicine  0.6 MG tablet Take 1 tablet (0.6 mg total) by mouth 2 (two) times daily for 5 days.   Eliquis  5 MG Tabs tablet Generic drug: apixaban  TAKE 1 TABLET BY MOUTH TWICE A DAY   fluticasone  50 MCG/ACT nasal spray Commonly known as: FLONASE  Place 1 spray into both nostrils daily as needed for allergies.   loratadine  10 MG tablet Commonly known as: CLARITIN  Take 10 mg by mouth daily as needed for allergies.   metoprolol  succinate 100 MG 24 hr tablet Commonly known as: TOPROL -XL Take 1 tablet (100 mg total) by mouth 2 (two) times daily. Take with or immediately following a meal.   pantoprazole  40 MG tablet Commonly known as: PROTONIX  TAKE 1 TABLET BY MOUTH EVERY DAY       Outstanding Labs/Studies   Duration of Discharge Encounter: APP Time: 31 minutes   Signed, Marlyse Single, PA-C 11/30/2023, 9:17 AM  I have seen and examined this patient with Marlyse Single.  Agree with above, note added to reflect my findings.  Patient went to the hospital yesterday after atrial fibrillation ablation.  Had ablation of the posterior wall, PVI, CTI.  She remains in sinus rhythm today.  Hady Niemczyk plan for discharge today with follow-up in clinic.  GEN: Well nourished, well developed, in no acute distress  HEENT: normal  Neck: no JVD, carotid bruits, or masses Cardiac: RRR; no murmurs, rubs, or gallops,no edema  Respiratory:  clear to auscultation bilaterally, normal work of breathing GI: soft, nontender, nondistended, + BS MS: no deformity or atrophy  Skin: warm and dry Neuro:  Strength and sensation are intact Psych: euthymic mood, full affect   MD time of discharge: 33 minutes  Peter Keyworth M. Meher Kucinski MD 11/30/2023 11:04 AM

## 2023-12-01 ENCOUNTER — Encounter (HOSPITAL_COMMUNITY): Payer: Self-pay | Admitting: Cardiology

## 2023-12-02 ENCOUNTER — Encounter: Payer: Self-pay | Admitting: Cardiology

## 2023-12-02 MED FILL — Fentanyl Citrate Preservative Free (PF) Inj 100 MCG/2ML: INTRAMUSCULAR | Qty: 2 | Status: AC

## 2023-12-03 ENCOUNTER — Encounter: Payer: Self-pay | Admitting: Cardiology

## 2023-12-17 ENCOUNTER — Telehealth: Payer: Self-pay | Admitting: Cardiology

## 2023-12-17 NOTE — Telephone Encounter (Signed)
 Left message for patient to call back.   Appointment made for patient tomorrow with EP APP.

## 2023-12-17 NOTE — Progress Notes (Unsigned)
 Electrophysiology Office Note:   Date:  12/18/2023  ID:  Victoria Williamson, DOB 22-Sep-1965, MRN [id number]  Primary Cardiologist: Constancia Delton, MD Primary Heart Failure: None Electrophysiologist: Boyce Byes, MD      History of Present Illness:   Victoria Williamson is a 58 y.o. female with h/o AF, AFL, SVT, dilated ascending aorta, HTN seen today for routine electrophysiology followup.   She was admitted 4/25-4/26/25 for re-do PVI ablation, ablation of the posterior wall and redo ablation of CTI.  She was discharged on colchicine  and Protonix  post procedure.   Pt called EP Clinic on 12/17/23 with reports of a "knot at her ablation site, drainage (unsure of color), and odor".   Since last being seen in our clinic the patient reports her right groin site has been fine. She noted a swelling in her left groin site, tenderness and drainage. She is unsure of the color of drainage. Has not had fever/chills.   She denies chest pain, palpitations, dyspnea, PND, orthopnea, nausea, vomiting, dizziness, syncope, edema, weight gain, or early satiety.   Review of systems complete and found to be negative unless listed in HPI.   EP Information / Studies Reviewed:    EKG is not ordered today. EKG from 11/30/23 reviewed which showed 79 bpm      Studies:  ECHO 2021 > LVEF 60-65% CT Cardiac Morphology 05/2020 > normal PV drainage into LA EPS 07/15/2020 > successful PVI, successful ablation of the CTI Cardiac Monitor 11/2020 > HR 54-190 bpm, ave 79 bpm, 7 episodes of SVT, longest lasting 1h33 min with ave rate of 128 bpm (AT), no evidence of AF ECHO 10/2023 > LVEF 65-70%, no RWMA, GII DD, LA mildly dilated, mild MV regurgitation EPS 11/29/23 > successful re-do PVI, successful ablation / isolation of the posterior wall, successful re-do ablation of typical AFL along the CTI  Arrhythmia / AAD AF AFL   Risk Assessment/Calculations:    CHA2DS2-VASc Score = 3   This indicates a 3.2% annual  risk of stroke. The patient's score is based upon: CHF History: 1 HTN History: 1 Diabetes History: 0 Stroke History: 0 Vascular Disease History: 0 Age Score: 0 Gender Score: 1             Physical Exam:   VS:  BP 138/70 (BP Location: Left Arm, Patient Position: Sitting, Cuff Size: Large)   Pulse 62   Resp 16   Ht 5\' 4"  (1.626 m)   Wt 232 lb 8 oz (105.5 kg)   SpO2 97%   BMI 39.91 kg/m    Wt Readings from Last 3 Encounters:  12/18/23 232 lb 8 oz (105.5 kg)  11/29/23 230 lb (104.3 kg)  11/21/23 230 lb 2.6 oz (104.4 kg)     GEN: pleasant, well nourished, well developed in no acute distress NECK: No JVD; No carotid bruits CARDIAC: Regular rate and rhythm, no murmurs, rubs, gallops RESPIRATORY:  Clear to auscultation without rales, wheezing or rhonchi  ABDOMEN: Soft, non-tender, non-distended EXTREMITIES:  No edema; No deformity. Right groin site well healed. Left groin site with ~2 inch narrow discrete hematoma, no drainage from puncture site, no warmth / redness or swelling, TTP  ASSESSMENT AND PLAN:    Persistent Atrial Fibrillation & Atrial Flutter  CHA2DS2-VASc 3, s/p PVI & CTI re-do ablation 11/29/23  -continue OAC for stroke prophylaxis  -no evidence of AF burden since ablation     Secondary Hypercoagulable State  -continue Eliquis  5mg  BID, dose reviewed and appropriate by  age / wt  R Groin Hematoma  -very small, no odor, drainage on exam. Tender to palpation, unable to express any drainage -keflex 500 mg BID x5 days  -encouraged patient to eat yogurt 1-2x per day while on abx, call if issues with subsequent yeast infection & will give diflucan x1   Follow up with EP APP as planned for post procedure follow up in [place], repeat groin check   Signed, Creighton Doffing, NP-C, AGACNP-BC [facility] - Electrophysiology  12/18/2023, 9:47 AM

## 2023-12-17 NOTE — Telephone Encounter (Signed)
 Called and spoke with patient. Patient states that she feels like her ablation site is infected. Patient states that there is a knot, it is red and warm to the touch. Patient reports drainage with odor but unsure what color the drainage is. Patient denies fever.

## 2023-12-17 NOTE — Telephone Encounter (Signed)
 Pt called in stating she thinks her wound from ablation is infected. She states it is red with a knot in it. She also said it is oozing (not sure what color but she said if she puts her hand on it, it feels wet) and it also has an odor. Please advise.

## 2023-12-18 ENCOUNTER — Encounter: Payer: Self-pay | Admitting: Pulmonary Disease

## 2023-12-18 ENCOUNTER — Ambulatory Visit: Payer: PRIVATE HEALTH INSURANCE | Attending: Pulmonary Disease | Admitting: Pulmonary Disease

## 2023-12-18 VITALS — BP 138/70 | HR 62 | Resp 16 | Ht 64.0 in | Wt 232.5 lb

## 2023-12-18 DIAGNOSIS — I483 Typical atrial flutter: Secondary | ICD-10-CM

## 2023-12-18 DIAGNOSIS — I4819 Other persistent atrial fibrillation: Secondary | ICD-10-CM

## 2023-12-18 DIAGNOSIS — D6869 Other thrombophilia: Secondary | ICD-10-CM

## 2023-12-18 MED ORDER — CEPHALEXIN 500 MG PO CAPS: 500.0000 mg | ORAL_CAPSULE | Freq: Two times a day (BID) | ORAL | 0 refills | Status: AC

## 2023-12-18 NOTE — Patient Instructions (Addendum)
 Medication Instructions:  Your physician has recommended you make the following change in your medication:  START CEPHALEXIN 500 MG TWICE DAILY FOR 5 DAYS. MAKE SURE TO EAT YOGURT WHILE ON THIS MEDICATION.   *If you need a refill on your cardiac medications before your next appointment, please call your pharmacy*  Lab Work: NONE If you have labs (blood work) drawn today and your tests are completely normal, you will receive your results only by: MyChart Message (if you have MyChart) OR A paper copy in the mail If you have any lab test that is abnormal or we need to change your treatment, we will call you to review the results.  Testing/Procedures: NONE  Follow-Up: At [facility], you and your health needs are our priority.  As part of our continuing mission to provide you with exceptional heart care, our providers are all part of one team.  This team includes your primary Cardiologist (physician) and Advanced Practice Providers or APPs (Physician Assistants and Nurse Practitioners) who all work together to provide you with the care you need, when you need it.  Your next appointment:   KEEP SCHEDULED FOLLOW-UP   We recommend signing up for the patient portal called "MyChart".  Sign up information is provided on this After Visit Summary.  MyChart is used to connect with patients for Virtual Visits (Telemedicine).  Patients are able to view lab/test results, encounter notes, upcoming appointments, etc.  Non-urgent messages can be sent to your provider as well.   To learn more about what you can do with MyChart, go to [email or web address].   Other Instructions

## 2023-12-27 ENCOUNTER — Encounter: Payer: Self-pay | Admitting: Cardiology

## 2023-12-27 ENCOUNTER — Ambulatory Visit: Payer: PRIVATE HEALTH INSURANCE | Attending: Cardiology | Admitting: Cardiology

## 2023-12-27 VITALS — BP 119/79 | HR 64 | Resp 16 | Ht 64.0 in | Wt 236.0 lb

## 2023-12-27 DIAGNOSIS — I4891 Unspecified atrial fibrillation: Secondary | ICD-10-CM | POA: Diagnosis not present

## 2023-12-27 DIAGNOSIS — I77810 Thoracic aortic ectasia: Secondary | ICD-10-CM

## 2023-12-27 DIAGNOSIS — D6869 Other thrombophilia: Secondary | ICD-10-CM | POA: Diagnosis not present

## 2023-12-27 DIAGNOSIS — I4892 Unspecified atrial flutter: Secondary | ICD-10-CM

## 2023-12-27 DIAGNOSIS — I480 Paroxysmal atrial fibrillation: Secondary | ICD-10-CM

## 2023-12-27 NOTE — Progress Notes (Signed)
 Electrophysiology Clinic Note    Date:  12/27/2023  Patient ID:  Victoria Williamson, DOB 1966/03/19, MRN [id number] PCP:  Luise Saint, MD  Cardiologist:  Constancia Delton, MD Electrophysiologist: Boyce Byes, MD   Discussed the use of AI scribe software for clinical note transcription with the patient, who gave verbal consent to proceed.   Patient Profile    Chief Complaint: AF ablation, groin site follow-up  History of Present Illness: Victoria Williamson is a 58 y.o. female with PMH notable for AF, AFL, SVT, dilated ascending aorta, HTN ; seen today for Boyce Byes, MD for routine electrophysiology followup.   She is s/p AF ablation w isolation of pulm vein, CTI on 07/2020.   She was admitted 4/25-4/26/25 for re-do PVI ablation, ablation of the posterior wall and redo ablation of CTI.  She was discharged on colchicine  and Protonix  post procedure.    Pt called EP Clinic on 12/17/23 with reports of a "knot at her ablation site, drainage (unsure of color), and odor". She saw NP Alena An 5/14 in clinic to eval L groin site which had a ~2in discrete hematoma, no drainage/warmth/redness/tenderness. She was prescribed keflex  x 5d.   On follow-up today, she had one brief episode of an elevated HR a few days after the ablation, but has not had any since. She occasionally has a tightening sensation in her chest that resolves quickly, typically in the evening after work, without associated symptoms such as shortness of breath, dizziness, or lightheadedness.  Her groin site has significantly improved. She has a small knot in L groin, much smaller than previous. She has completed her abx without adverse side effects. She denies pain, edema, or redness to the area.   She continues to take eliquis  BID, no significant bleeding issues though does have occasional bleeding from gum when brushing teeth.      Arrhythmia/Device History No specialty comments available.     ROS:   Please see the history of present illness. All other systems are reviewed and otherwise negative.    Physical Exam    VS:  BP 119/79 (BP Location: Left Arm, Patient Position: Sitting, Cuff Size: Large)   Pulse 64   Resp 16   Ht 5\' 4"  (1.626 m)   Wt 236 lb (107 kg)   SpO2 95%   BMI 40.51 kg/m  BMI: Body mass index is 40.51 kg/m.  Wt Readings from Last 3 Encounters:  12/27/23 236 lb (107 kg)  12/18/23 232 lb 8 oz (105.5 kg)  11/29/23 230 lb (104.3 kg)     GEN- The patient is well appearing, alert and oriented x 3 today.   Lungs- Clear to ausculation bilaterally, normal work of breathing.  Heart- Regular rate and rhythm, no murmurs, rubs or gallops Extremities- Trace peripheral edema, warm, dry  L groin - small ~1cm hematoma, no redness, or edema to the area. Not tender to palpation.    Studies Reviewed   Previous EP, cardiology notes.    EKG is ordered. Personal review of EKG from today shows:    EKG Interpretation Date/Time:  Friday Dec 27 2023 13:19:37 EDT Ventricular Rate:  69 PR Interval:  152 QRS Duration:  94 QT Interval:  406 QTC Calculation: 435 R Axis:   12  Text Interpretation: Normal sinus rhythm Normal ECG Confirmed by Leena Tiede [phone]) on 12/27/2023 1:23:27 PM    TTE, 10/29/2023  1. Left ventricular ejection fraction, by estimation, is 65 to 70%. The  left ventricle has normal function. The left ventricle has no regional wall motion abnormalities. Left ventricular diastolic parameters are consistent with Grade II diastolic dysfunction (pseudonormalization). The average left ventricular global  longitudinal strain is -19.3 %. The global longitudinal strain is normal.   2. Right ventricular systolic function is normal. The right ventricular size is normal. There is normal pulmonary artery systolic pressure. The estimated right ventricular systolic pressure is 20.2 mmHg.   3. Left atrial size was mildly dilated.   4. The mitral valve is normal in  structure. Mild mitral valve regurgitation. No evidence of mitral stenosis.   5. The aortic valve is normal in structure. Aortic valve regurgitation is not visualized. No aortic stenosis is present.   6. The inferior vena cava is normal in size with greater than 50% respiratory variability, suggesting right atrial pressure of 3 mmHg.   Chest CT, 07/07/2020 1. 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 Recommend annual imaging followup by CTA or MRA. This recommendation follows 2010 ACCF/AHA/AATS/ACR/ASA/SCA/SCAI/SIR/STS/SVM Guidelines for the Diagnosis and Management of Patients with Thoracic Aortic Disease. Circulation. 2010; 121: W119-J478. Aortic aneurysm NOS (ICD10-I71.9).   Assessment and Plan     #) parox AFib #) aflutter #) post-procedural hematoma S/p AF ablation 2021 S/p redo AF, aflutter ablation 11/2023 Maintaining sinus rhythm since most recent ablation Groin site continues to heal well   #) Hypercoag d/t parox afib CHA2DS2-VASc Score = at least 3 [CHF History: 0, HTN History: 1, Diabetes History: 0, Stroke History: 0, Vascular Disease History: 1, Age Score: 0, Gender Score: 1].  Therefore, Stroke ppx - 5mg  eliquis  BID, appropriately dosed No significant bleeding concerns         Current medicines are reviewed at length with the patient today.   The patient does not have concerns regarding her medicines.  The following changes were made today:  none  Labs/ tests ordered today include:  Orders Placed This Encounter  Procedures   EKG 12-Lead     Disposition: Follow up with Dr. Marven Slimmer or EP APP in 2 months    Signed, Adaline Holly, NP  12/27/23  2:37 PM  Electrophysiology [facility]

## 2023-12-27 NOTE — Patient Instructions (Signed)
 Medication Instructions:  The current medical regimen is effective;  continue present plan and medications as directed. Please refer to the Current Medication list given to you today.   *If you need a refill on your cardiac medications before your next appointment, please call your pharmacy*  Follow-Up: At [facility], you and your health needs are our priority.  As part of our continuing mission to provide you with exceptional heart care, our providers are all part of one team.  This team includes your primary Cardiologist (physician) and Advanced Practice Providers or APPs (Physician Assistants and Nurse Practitioners) who all work together to provide you with the care you need, when you need it.  Your next appointment:   Keep as scheduled   We recommend signing up for the patient portal called "MyChart".  Sign up information is provided on this After Visit Summary.  MyChart is used to connect with patients for Virtual Visits (Telemedicine).  Patients are able to view lab/test results, encounter notes, upcoming appointments, etc.  Non-urgent messages can be sent to your provider as well.   To learn more about what you can do with MyChart, go to [email or web address].

## 2024-01-03 ENCOUNTER — Other Ambulatory Visit: Payer: Self-pay | Admitting: *Deleted

## 2024-01-03 MED ORDER — FLUCONAZOLE 150 MG PO TABS: 150.0000 mg | ORAL_TABLET | Freq: Once | ORAL | 0 refills | Status: AC

## 2024-01-21 ENCOUNTER — Ambulatory Visit (INDEPENDENT_AMBULATORY_CARE_PROVIDER_SITE_OTHER): Payer: PRIVATE HEALTH INSURANCE | Admitting: Nurse Practitioner

## 2024-01-21 VITALS — BP 120/90 | HR 60 | Temp 97.8°F | Ht 64.0 in | Wt 240.4 lb

## 2024-01-21 DIAGNOSIS — M545 Low back pain, unspecified: Secondary | ICD-10-CM

## 2024-01-21 DIAGNOSIS — G8929 Other chronic pain: Secondary | ICD-10-CM

## 2024-01-21 NOTE — Assessment & Plan Note (Signed)
 Longstanding worsening back pain.  Patient has tried conservative treatment of physical therapy and doing at home physical therapy for several months.  Will obtain MR of the lumbar spine query lumbar stenosis

## 2024-01-21 NOTE — Progress Notes (Signed)
 Acute Office Visit  Subjective:     Patient ID: Victoria Williamson Reason, female    DOB: 04-Nov-1965, 58 y.o.   MRN: [id number]  Chief Complaint  Patient presents with   Back Pain    Pt complains of middle and lower back pain that's been ongoing for about a year. Pt states pain has worsened. Pt complains of burning pain and when walking the pain increases. X-Ray done in 2021 found mild disc bulging in L4,L5.     HPI Patient is in today for back pain with a history of Heart failure, HTN, A-fib, emphysema, GERD, lumbar radiculitis, chronic bilateral low back pain.  Patient has had a MR t and L spine both done in 2023.  T spine showed non neoplastic cyst  L spine showed severe facet arthropathy at l4-l5. She did had several bulging discs  States that it has been going on for several years. She was seen by an internationalist and getting injections. States that they got to where   States that she has been doing some exercises that she has learned fro [employer] [facility]. Statse that she has been doing it for 4-5 months and sometimes it makes it worse.  States that she quit her job because of the positioning  States that in [place] or march of 2024 it started and it has been getting worse. She did not injure herself She has tried advil that will help some but not all the way Tylenol  does not help Dull achy burning pain that is intermittetn. States that she does not hurt with rest. Stats that when she walks or does acitivites like cleaning house  States that she did have some left leg numbness when she was laying on her right side.    Review of Systems  Constitutional:  Negative for chills and fever.  Respiratory:  Negative for shortness of breath.   Cardiovascular:  Negative for chest pain.  Musculoskeletal:  Positive for back pain.  Neurological:  Positive for tingling. Negative for weakness and headaches.        Objective:    BP (!) 120/90   Pulse 60   Temp 97.8 F (36.6 C) (Oral)    Ht 5' 4 (1.626 m)   Wt 240 lb 6.4 oz (109 kg)   SpO2 99%   BMI 41.26 kg/m  BP Readings from Last 3 Encounters:  01/21/24 (!) 120/90  12/27/23 119/79  12/18/23 138/70   Wt Readings from Last 3 Encounters:  01/21/24 240 lb 6.4 oz (109 kg)  12/27/23 236 lb (107 kg)  12/18/23 232 lb 8 oz (105.5 kg)   SpO2 Readings from Last 3 Encounters:  01/21/24 99%  12/27/23 95%  12/18/23 97%      Physical Exam Vitals and nursing note reviewed.  Constitutional:      Appearance: Normal appearance.   Cardiovascular:     Rate and Rhythm: Normal rate and regular rhythm.     Pulses:          Dorsalis pedis pulses are 1+ on the right side and 1+ on the left side.     Heart sounds: Normal heart sounds.  Pulmonary:     Effort: Pulmonary effort is normal.     Breath sounds: Normal breath sounds.   Musculoskeletal:        General: Tenderness present.     Lumbar back: Tenderness and bony tenderness present. Negative right straight leg raise test and negative left straight leg raise test.  Right lower leg: Edema present.     Left lower leg: Edema present.   Neurological:     Mental Status: She is alert.     Deep Tendon Reflexes:     Reflex Scores:      Patellar reflexes are 1+ on the right side and 1+ on the left side.    Comments: Bilateral lower extremity strength 5/5    No results found for any visits on 01/21/24.      Assessment & Plan:   Problem List Items Addressed This Visit       Other   Chronic bilateral low back pain without sciatica - Primary   Longstanding worsening back pain.  Patient has tried conservative treatment of physical therapy and doing at home physical therapy for several months.  Will obtain MR of the lumbar spine query lumbar stenosis      Relevant Orders   MR Lumbar Spine Wo Contrast    No orders of the defined types were placed in this encounter.   Return if symptoms worsen or fail to improve.  Victoria Shay, NP

## 2024-01-21 NOTE — Patient Instructions (Signed)
 [facility] [address]  [phone]  Call and schedule and the MRI. I have placed the order

## 2024-01-22 ENCOUNTER — Encounter: Payer: Self-pay | Admitting: *Deleted

## 2024-02-03 ENCOUNTER — Other Ambulatory Visit: Payer: Self-pay | Admitting: *Deleted

## 2024-02-03 ENCOUNTER — Encounter: Payer: Self-pay | Admitting: Cardiology

## 2024-02-03 MED ORDER — METOPROLOL SUCCINATE ER 100 MG PO TB24: 100.0000 mg | ORAL_TABLET | Freq: Two times a day (BID) | ORAL | 0 refills | Status: DC

## 2024-02-04 ENCOUNTER — Encounter: Payer: Self-pay | Admitting: Cardiology

## 2024-02-04 ENCOUNTER — Telehealth: Payer: Self-pay

## 2024-02-04 ENCOUNTER — Ambulatory Visit: Payer: Self-pay | Attending: Cardiology | Admitting: Cardiology

## 2024-02-04 VITALS — BP 132/88 | HR 64 | Ht 64.0 in | Wt 242.0 lb

## 2024-02-04 DIAGNOSIS — I77810 Thoracic aortic ectasia: Secondary | ICD-10-CM

## 2024-02-04 DIAGNOSIS — Z79899 Other long term (current) drug therapy: Secondary | ICD-10-CM

## 2024-02-04 DIAGNOSIS — I10 Essential (primary) hypertension: Secondary | ICD-10-CM

## 2024-02-04 DIAGNOSIS — I471 Supraventricular tachycardia, unspecified: Secondary | ICD-10-CM

## 2024-02-04 DIAGNOSIS — R0602 Shortness of breath: Secondary | ICD-10-CM

## 2024-02-04 DIAGNOSIS — I480 Paroxysmal atrial fibrillation: Secondary | ICD-10-CM

## 2024-02-04 DIAGNOSIS — I483 Typical atrial flutter: Secondary | ICD-10-CM

## 2024-02-04 DIAGNOSIS — R600 Localized edema: Secondary | ICD-10-CM

## 2024-02-04 MED ORDER — POTASSIUM CHLORIDE ER 10 MEQ PO TBCR: 10.0000 meq | EXTENDED_RELEASE_TABLET | Freq: Every day | ORAL | 3 refills | Status: DC

## 2024-02-04 MED ORDER — FUROSEMIDE 20 MG PO TABS: 20.0000 mg | ORAL_TABLET | Freq: Every day | ORAL | 3 refills | Status: DC

## 2024-02-04 NOTE — Patient Instructions (Addendum)
 Medication Instructions:  Your physician recommends the following medication changes.  START TAKING: Lasix  20 mg daily Potassium 10 daily  *If you need a refill on your cardiac medications before your next appointment, please call your pharmacy*  Lab Work: Your provider would like for you to return in 2 weeks to have the following labs drawn: BMP.   Please go to [facility] [address] ([facility] [address], [place] [address] You do not need an appointment.  They are open from 8 am- 4:30 pm.  Lunch from 1:00 pm- 2:00 pm You do not need to be fasting.   You may also go to one of the following [facility]:  [address] [place], [place] [address] Phone: [phone] Lab hours: Mon-Fri 8 am- 5 pm    Lunch 12 pm- 1 pm  [address] [place],  [place]  [address]  [place]  Phone: [phone] Lab hours: 7 am- 4 pm Lunch 12 pm-1 pm   [address] [place],  [place]  [address]  [place]  Phone: [phone] Lab hours: Mon-Fri 8 am- 5 pm    Lunch 12 pm- 1 pm  If you have labs (blood work) drawn today and your tests are completely normal, you will receive your results only by: MyChart Message (if you have MyChart) OR A paper copy in the mail If you have any lab test that is abnormal or we need to change your treatment, we will call you to review the results.  Testing/Procedures: No test ordered today   Follow-Up: At [facility], you and your health needs are our priority.  As part of our continuing mission to provide you with exceptional heart care, our providers are all part of one team.  This team includes your primary Cardiologist (physician) and Advanced Practice Providers or APPs (Physician Assistants and Nurse Practitioners) who all work together to provide you with the care you need, when you need it.  Your next appointment:   2 month(s)  Provider:   Redell Cave, MD or Tylene Lunch, NP       Patient assistance request sent in for Eliquis 

## 2024-02-04 NOTE — Telephone Encounter (Signed)
 Copied from CRM [id number]. Topic: Clinical - Request for Lab/Test Order >> Feb 04, 2024 11:59 AM Burnard DEL wrote: Reason for CRM: Patient called in stating that [facility] is still saying they have not received order for MRI.

## 2024-02-04 NOTE — Progress Notes (Signed)
 Cardiology Office Note   Date:  02/04/2024  ID:  Victoria Williamson, DOB March 14, 1966, MRN [id number] PCP: [name], NP  [facility] Providers Cardiologist:  Redell Cave, MD Electrophysiologist:  OLE ONEIDA HOLTS, MD     History of Present Illness Victoria Williamson is a 58 y.o. female with a past medical history of hypertension, paroxysmal atrial fibrillation status post ablation (07/2020 and 11/2023), SVT, obesity, chronic HFpEF, mild aortic dilatation, palpitations, who is here today for follow-up.   Previous PET myocardial perfusion stress test in 01/2020 showed no evidence of ischemia.  Echocardiogram 12/2019 showed normal systolic function, normal wall thickness, EF 55%, G2 DD.  Left heart cath in 2014 she was told she had normal coronary arteries.  Echocardiogram completed 07/2021 revealed LVEF of 60-65%, borderline ascending aortic dilatation at 38 mm.  Of note she was initially diagnosed with atrial fibrillation in February 2021.  She underwent catheter ablation for atrial fibrillation in December 2021.  During the procedure, the pulmonary veins and CTI were ablated.  She was last seen in clinic by EP 09/2023 with reported SVT that was different than her prior A-fib episodes.  They discussed antiarrhythmic drugs in the past but there was were going to interfere with her Abilify  and were not started.  She was taking metoprolol  and was doing well without sustained recurrence of arrhythmia.  After further discussion of treatment options it was decided that she would proceed with redo catheter ablation which was deemed reasonable.  She was advised she would need to hold her metoprolol  for 5 days prior to her ablation procedure.  She was seen in clinic/7/25 by Dr.Agbor-Etang.  She was continued to have palpitations over the past several months initially happening once daily milligram 1 twice daily lasting a few minutes with associated symptoms of dizziness.  She was evaluated by EP  and with repeat ablation scheduled for later this month.  She been compliant with her Toprol -XL and Eliquis  without any issues.  There were no medication changes that were made and no further testing that was ordered it was still waiting until her ablation was planned with APP.   She was seen in clinic 11/21/2023 where she continued palpitations, fatigue, shortness of breath.  She had been scheduled for an ablation procedure with EP in April 25.  She did not miss any doses of apixaban  at that time.  EKG was completed and revealed she was in SVT at 150 bpm.  She was treated with adenosine  6 mg and 12 mg in clinic and she continued to be in atrial flutter.  She was sent to the emergency department for further evaluation.   She was admitted to the hospital 4/25 - 11/30/2023 for redo PVI ablation, ablation posterior wall redo ablation of CTI.  She was discharged on colchicine  and Protonix  postprocedure.  She was seen in EP clinic 12/17/2023 with reports of a knot at her ablation site, drainage, or odor.  Left groin site had approximately 2-discrete hematoma, no drainage/warmth/redness/tenderness.  She was prescribed Keflex  for 5 days.  She was last seen in clinic 12/27/2023 stating she had 1 brief episode of elevated heart rate few days after the ablation but is not any since that time.  She stated she occasionally had a tightening sensation in her chest that resolves quickly, typically in the evening after work, without associated symptoms such as shortness of breath, dizziness, or lightheadedness.  Left groin site had significantly improved since antibiotic therapy.  She returns clinic  today stating that since her ablation procedure she has been having worsening shortness of breath, weight gain, and peripheral edema.  She denies any chest pain.  She has had 1 episode of elevated heart rate approximately 119 bpm since her ablation.  She had a hematoma to the left groin and ended up taking antibiotics to prevent  infection.  That has resolved since she has finished antibiotic therapy.  States that she has been compliant with her current medication regimen.  States having no missed doses of apixaban  and no noted blood in her urine or stool.  Losartan  continues to remain on hold until blood pressure is consistently above 130 and they can restart likely at half dose.  ROS: 10 point review of systems has been reviewed and considered negative except what was listed in the HPI  Studies Reviewed EKG Interpretation Date/Time:  Tuesday February 04 2024 14:54:41 EDT Ventricular Rate:  64 PR Interval:  152 QRS Duration:  88 QT Interval:  420 QTC Calculation: 433 R Axis:   32  Text Interpretation: Normal sinus rhythm Normal ECG When compared with ECG of 27-Dec-2023 13:19, No significant change was found Confirmed by Gerard Frederick ([id number]) on 02/04/2024 2:58:10 PM    2D echo 10/29/2023 1. Left ventricular ejection fraction, by estimation, is 65 to 70%. The  left ventricle has normal function. The left ventricle has no regional  wall motion abnormalities. Left ventricular diastolic parameters are  consistent with Grade II diastolic  dysfunction (pseudonormalization). The average left ventricular global  longitudinal strain is -19.3 %. The global longitudinal strain is normal.   2. Right ventricular systolic function is normal. The right ventricular  size is normal. There is normal pulmonary artery systolic pressure. The  estimated right ventricular systolic pressure is 20.2 mmHg.   3. Left atrial size was mildly dilated.   4. The mitral valve is normal in structure. Mild mitral valve  regurgitation. No evidence of mitral stenosis.   5. The aortic valve is normal in structure. Aortic valve regurgitation is  not visualized. No aortic stenosis is present.   6. The inferior vena cava is normal in size with greater than 50%  respiratory variability, suggesting right atrial pressure of 3 mmHg.    2D echo  07/27/2021 1. Left ventricular ejection fraction, by estimation, is 60 to 65%. The  left ventricle has normal function. The left ventricle has no regional  wall motion abnormalities. There is mild left ventricular hypertrophy.  Left ventricular diastolic parameters  were normal.   2. Right ventricular systolic function is normal. The right ventricular  size is normal. There is mildly elevated pulmonary artery systolic  pressure. The estimated right ventricular systolic pressure is 36.8 mmHg.   3. The mitral valve is normal in structure. No evidence of mitral valve  regurgitation. No evidence of mitral stenosis.   4. The aortic valve was not well visualized. Aortic valve regurgitation  is not visualized. No aortic stenosis is present.   5. There is borderline dilatation of the ascending aorta, measuring 38  mm.   6. The inferior vena cava is normal in size with greater than 50%  respiratory variability, suggesting right atrial pressure of 3 mmHg.   Chest CT, 07/07/2020 1. 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 Recommend annual imaging followup by CTA or MRA. This recommendation follows 2010 ACCF/AHA/AATS/ACR/ASA/SCA/SCAI/SIR/STS/SVM Guidelines for the Diagnosis and Management of Patients with Thoracic Aortic Disease. Circulation. 2010; 121: Z733-z630. Aortic aneurysm NOS (ICD10-I71.9).  Risk Assessment/Calculations  CHA2DS2-VASc Score = 3   This indicates a 3.2% annual risk of stroke. The patient's score is based upon: CHF History: 0 HTN History: 1 Diabetes History: 0 Stroke History: 0 Vascular Disease History: 1 Age Score: 0 Gender Score: 1            Physical Exam VS:  BP 132/88 (BP Location: Left Arm, Patient Position: Sitting, Cuff Size: Large)   Pulse 64   Ht 5' 4 (1.626 m)   Wt 242 lb (109.8 kg)   SpO2 99%   BMI 41.54 kg/m        Wt Readings from Last 3 Encounters:  02/04/24 242 lb (109.8 kg)  01/21/24 240 lb 6.4 oz (109 kg)  12/27/23 236  lb (107 kg)    GEN: Well nourished, well developed in no acute distress NECK: No JVD; No carotid bruits CARDIAC: RRR, no murmurs, rubs, gallops RESPIRATORY:  Clear to auscultation without rales, wheezing or rhonchi  ABDOMEN: Soft, non-tender, non-distended EXTREMITIES:  No edema; No deformity   ASSESSMENT AND PLAN Paroxysmal atrial fibrillation/flutter /pSVT status post ablation in 11/2023.  EKG today reveals sinus rhythm with rate of 64 with no acute changes.  She is continued on apixaban  5 mg twice daily for CHA2DS2-VASc score of at least 3 for stroke prophylaxis and continued on metoprolol  succinate 100 mg twice daily.  Encouraged to continue to follow-up with EP had previously scheduled appointment.  Primary hypertension with blood pressure today 132/88.  Blood pressures remain stable.  She is continued on Toprol -XL 100 mg twice daily.  Previously was on losartan  50 mg daily that remains on hold.  It was to be restarted once blood pressure improved to greater than 130 systolic with starting furosemide  today losartan  continues to remain on hold.  She has been encouraged to monitor pressure 1 to 2 hours postmedication administration as well.  Shortness of breath with last echocardiogram revealing an LVEF of 65 to 70%, left ventricular function, no regional wall motion abnormalities, grade 2 diastolic dysfunction, mild MR. With weight gain and peripheral edema today she is being started on furosemide  20 mg daily with potassium chloride  10 mEq daily and repeat BMP in 2 weeks.  Upon further chart review it is noted that her weight is elevated.  Talking with her she had previously been on torsemide  and is unsure of when her diuretic had fallen off of her medication list.  Ectatic/Mild ascending aortic dilatation was measured at 30 mm on echo in 07/2021 repeat echo completed 10/2023 with no mention of findings.  Will continue to monitor with surveillance studies.  Morbid obesity with a BMI of 41.54.   Makes pregnancies difficult.  She would benefit from weight loss.  Has been encouraged to incorporate dietary changes and increase activity as tolerated.       Dispo: Patient to return to clinic to see MD/APP in 8 weeks or sooner if needed for further evaluation.  Signed, Stephane Niemann, NP

## 2024-02-05 NOTE — Telephone Encounter (Signed)
 Order has been placed and in the order details and has [facility] with an address and we either fax this to them or get them electronically

## 2024-02-14 ENCOUNTER — Encounter: Payer: Self-pay | Admitting: Nurse Practitioner

## 2024-02-19 ENCOUNTER — Telehealth: Payer: Self-pay | Admitting: Cardiology

## 2024-02-19 NOTE — Telephone Encounter (Signed)
 Pt states that we were supposed to send in pt assistance for Eliquis  and she called them and they have not rec'vd anything from us  yet They gave her a fax # of [phone] att: Autumn. Please call pt with update

## 2024-02-21 LAB — CBC
Hematocrit: 40.3 % (ref 34.0–46.6)
Hemoglobin: 13.1 g/dL (ref 11.1–15.9)
MCH: 30 pg (ref 26.6–33.0)
MCHC: 32.5 g/dL (ref 31.5–35.7)
MCV: 92 fL (ref 79–97)
Platelets: 203 x10E3/uL (ref 150–450)
RBC: 4.36 x10E6/uL (ref 3.77–5.28)
RDW: 12.4 % (ref 11.7–15.4)
WBC: 7.9 x10E3/uL (ref 3.4–10.8)

## 2024-02-25 ENCOUNTER — Other Ambulatory Visit: Payer: Self-pay | Admitting: Cardiology

## 2024-02-25 NOTE — Telephone Encounter (Signed)
Pt calling for a update

## 2024-02-26 ENCOUNTER — Encounter: Payer: Self-pay | Admitting: Nurse Practitioner

## 2024-02-26 DIAGNOSIS — G8929 Other chronic pain: Secondary | ICD-10-CM

## 2024-02-27 ENCOUNTER — Telehealth: Payer: Self-pay

## 2024-02-27 ENCOUNTER — Ambulatory Visit: Payer: PRIVATE HEALTH INSURANCE | Attending: Cardiology | Admitting: Cardiology

## 2024-02-27 VITALS — BP 140/84 | HR 67 | Ht 64.0 in | Wt 246.6 lb

## 2024-02-27 DIAGNOSIS — I480 Paroxysmal atrial fibrillation: Secondary | ICD-10-CM

## 2024-02-27 DIAGNOSIS — I4892 Unspecified atrial flutter: Secondary | ICD-10-CM

## 2024-02-27 DIAGNOSIS — D6869 Other thrombophilia: Secondary | ICD-10-CM

## 2024-02-27 DIAGNOSIS — Z79899 Other long term (current) drug therapy: Secondary | ICD-10-CM | POA: Diagnosis not present

## 2024-02-27 NOTE — Telephone Encounter (Signed)
 Clinic made aware team does not have any paperwork for pt. Staff onsite again on 02/28/24 and can mail patient another BMS PAP application.

## 2024-02-27 NOTE — Patient Instructions (Signed)
 Medication Instructions:  The current medical regimen is effective;  continue present plan and medications.  *If you need a refill on your cardiac medications before your next appointment, please call your pharmacy*  Follow-Up: At [facility], you and your health needs are our priority.  As part of our continuing mission to provide you with exceptional heart care, our providers are all part of one team.  This team includes your primary Cardiologist (physician) and Advanced Practice Providers or APPs (Physician Assistants and Nurse Practitioners) who all work together to provide you with the care you need, when you need it.  Your next appointment:   6 month(s)  Provider:   Ole Holts, MD or Suzann Riddle, NP    We recommend signing up for the patient portal called MyChart.  Sign up information is provided on this After Visit Summary.  MyChart is used to connect with patients for Virtual Visits (Telemedicine).  Patients are able to view lab/test results, encounter notes, upcoming appointments, etc.  Non-urgent messages can be sent to your provider as well.   To learn more about what you can do with MyChart, go to [email or web address].   Other Instructions Keep scheduled appointment with Dr.Agbor-Etang.      I have sent a message to our patient assistance team for the Eliquis - they are going to reach out to you in regards to this. Please call us  if you get low on Eliquis .

## 2024-02-27 NOTE — Telephone Encounter (Signed)
-----   Message from Nurse Mliss T sent at 02/27/2024  1:39 PM EDT ----- Walterine! Can we check in on Eliquis  patient assistance for this patient, she was seeing Suzann Riddle, NP today in office and said she sent some paperwork in, but the company was saying they did not have anything.   Is there anyway we can look into this for her?   I am going to supply her with a few samples until we figure this out, if there is not already paperwork information, can we assist in getting this for her? She is running out, so the sooner the better would be amazing!    Thank you all for the help!

## 2024-02-27 NOTE — Progress Notes (Signed)
 Electrophysiology Clinic Note    Date:  02/27/2024  Patient ID:  Malajah Ahmiyah Coil, DOB 01-Oct-1965, MRN [id number] PCP:  [name], NP  Cardiologist:  Redell Cave, MD   Electrophysiologist:  OLE ONEIDA HOLTS, MD  Electrophysiology APP:  Katessa Attridge, NP    Discussed the use of AI scribe software for clinical note transcription with the patient, who gave verbal consent to proceed.   Patient Profile    Chief Complaint: AF ablation follow-up  History of Present Illness: Caterina Lella Mullany is a 58 y.o. female with PMH notable for AFib, AFL, SVT, dilated ascending aorta, HTN ; seen today for OLE ONEIDA HOLTS, MD for routine electrophysiology follow-up s/p Ablation.  She is s/p AF ablation w isolation of pulm vein, CTI on 07/2020.    She was admitted 4/25-4/26/25 for re-do PVI ablation, ablation of the posterior wall and redo ablation of CTI.   She had concerns post-procedurally of a knot with drainage at her L groin access site, and was rx'd keflex . I last saw her 12/2023 where her groin knot was much smaller and nontender, no further drainage.   She saw NP Hammock for routine cardiology follow up earlier this month, c/o increased edema, SOB. She was prescribed daily lasix  at that time.   On follow-up today, her groin site is completely healed.  She continues to have SOB with walking short distances but not while sitting or talking. She was SOB walking from parking spot to the clinic door. She is taking lasix  daily and has noticed improvement in her edema but not her SOB. She is able to sleep flat for a couple hours, then has to relocate to recliner because of back pain. No coughing, no change in appetite.  She checks her BP regularly, and it has alerted her twice to AF episodes since ablation. She is asymptomatic of this, whereas prior to ablation she was quite symptomatic during AF episodes.   She continues to take eliquis  BID, no bleeding concerns. She has  recently lost her health insurance and is worried about paying for eliquis  out of pocket.    Arrhythmia/Device History No specialty comments available.    ROS:  Please see the history of present illness. All other systems are reviewed and otherwise negative.    Physical Exam    VS:  BP (!) 140/84 (BP Location: Left Arm, Patient Position: Sitting, Cuff Size: Normal)   Pulse 67   Ht 5' 4 (1.626 m)   Wt 246 lb 9.6 oz (111.9 kg)   SpO2 94%   BMI 42.33 kg/m  BMI: Body mass index is 42.33 kg/m.      Wt Readings from Last 3 Encounters:  02/27/24 246 lb 9.6 oz (111.9 kg)  02/04/24 242 lb (109.8 kg)  01/21/24 240 lb 6.4 oz (109 kg)     GEN- The patient is well appearing, alert and oriented x 3 today.   Lungs- Clear to ausculation bilaterally, normal work of breathing.  Heart- Regular rate and rhythm, no murmurs, rubs or gallops Extremities- 1+ peripheral edema, warm, dry    Studies Reviewed   Previous EP, cardiology notes.    EKG is ordered. Personal review of EKG from today shows:    EKG Interpretation Date/Time:  Thursday February 27 2024 13:25:02 EDT Ventricular Rate:  67 PR Interval:  150 QRS Duration:  92 QT Interval:  414 QTC Calculation: 437 R Axis:   54  Text Interpretation: Normal sinus rhythm Normal ECG Confirmed  by Avanelle Pixley [phone]) on 02/27/2024 1:28:40 PM    CTA chest, aorta 05/29/2023 1. Stable mild aneurysmal dilatation of the ascending aorta measuring 4 cm. Recommend annual imaging followup by CTA or MRA. This recommendation follows 2010 ACCF/AHA/AATS/ACR/ASA/SCA/SCAI/SIR/STS/SVM Guidelines for the Diagnosis and Management of Patients with Thoracic Aortic Disease. Circulation. 2010; 121: Z733-z630. Aortic aneurysm NOS (ICD10-I71.9) 2. Emphysema. 3. 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 MRI with contrast may be beneficial for further characterization.  TTE, 10/29/2023  1. Left ventricular  ejection fraction, by estimation, is 65 to 70%. The left ventricle has normal function. The left ventricle has no regional wall motion abnormalities. Left ventricular diastolic parameters are consistent with Grade II diastolic dysfunction (pseudonormalization). The average left ventricular global longitudinal strain is -19.3 %. The global longitudinal strain is normal.   2. Right ventricular systolic function is normal. The right ventricular size is normal. There is normal pulmonary artery systolic pressure. The estimated right ventricular systolic pressure is 20.2 mmHg.   3. Left atrial size was mildly dilated.   4. The mitral valve is normal in structure. Mild mitral valve regurgitation. No evidence of mitral stenosis.   5. The aortic valve is normal in structure. Aortic valve regurgitation is not visualized. No aortic stenosis is present.   6. The inferior vena cava is normal in size with greater than 50% respiratory variability, suggesting right atrial pressure of 3 mmHg.    Chest CT, 07/07/2020 1. 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 Recommend annual imaging followup by CTA or MRA. This recommendation follows 2010 ACCF/AHA/AATS/ACR/ASA/SCA/SCAI/SIR/STS/SVM Guidelines for the Diagnosis and Management of Patients with Thoracic Aortic Disease. Circulation. 2010; 121: Z733-z630. Aortic aneurysm NOS (ICD10-I71.9).  Assessment and Plan     #) parox AFib #) aflutter S/p AFib ablation 2021, s/p redo afib, aflutter ablation 11/2023 BP machine notifies her of irregular heart rhythm, but she is asymptomatic Considered updated 2 week zio, though with patient's loss of insurance, will defer at this time Consider flecainide  or tikosyn in AFib continues Continue 100mg  toprol  BID   #) Hypercoag d/t parox afib CHA2DS2-VASc Score = at least 3 [CHF History: 0, HTN History: 1, Diabetes History: 0, Stroke History: 0, Vascular Disease History: 1, Age Score: 0, Gender Score: 1].  Therefore,  the patient's annual risk of stroke is 3.2 %.    Stroke ppx - 5mg  eliquis  BID, appropriately dosed No bleeding concerns Will msg pharmacy team regarding patient assistance Consider warfarin if eliquis  becomes cost-prohibitive  #) diastolic HF #) lower extremity edema #) DOE Recently started on 20mg  lasix  daily Has noticed slight improvement in lower extremity edema, but not significant Due for BMP today Consider increasing dose if Cr is stable       Current medicines are reviewed at length with the patient today.   The patient has concerns regarding her medicines.  The following changes were made today:  none  Labs/ tests ordered today include:  Orders Placed This Encounter  Procedures   EKG 12-Lead     Disposition: Follow up with Dr. Cindie or EP APP in 6 months  Follow-up with Dr. Darliss in ~6 weeks to re-eval AFib   Signed, Chantal Needle, NP  02/27/24  2:47 PM  Electrophysiology [facility]

## 2024-02-28 ENCOUNTER — Ambulatory Visit: Payer: Self-pay | Admitting: Nurse Practitioner

## 2024-02-28 ENCOUNTER — Ambulatory Visit: Payer: Self-pay | Admitting: Medical

## 2024-02-28 LAB — BASIC METABOLIC PANEL WITH GFR
BUN/Creatinine Ratio: 14 (ref 9–23)
BUN: 14 mg/dL (ref 6–24)
CO2: 23 mmol/L (ref 20–29)
Calcium: 9.2 mg/dL (ref 8.7–10.2)
Chloride: 102 mmol/L (ref 96–106)
Creatinine, Ser: 1.01 mg/dL — ABNORMAL HIGH (ref 0.57–1.00)
Glucose: 72 mg/dL (ref 70–99)
Potassium: 4 mmol/L (ref 3.5–5.2)
Sodium: 142 mmol/L (ref 134–144)
eGFR: 65 mL/min/1.73 (ref >59)

## 2024-02-28 NOTE — Telephone Encounter (Signed)
 PAP: Patient assistance application for Eliquis  through [employer] ([employer] has been mailed to pt's home address on file. Provider portion of application will be faxed to provider's office once pt portion has been received.

## 2024-03-13 ENCOUNTER — Other Ambulatory Visit (HOSPITAL_COMMUNITY): Payer: Self-pay

## 2024-03-13 NOTE — Telephone Encounter (Signed)
 2nd attempt PAP: Patient assistance application for Eliquis  through [employer] has been mailed to pt's home address on file.

## 2024-03-27 NOTE — Telephone Encounter (Signed)
 Unable to obtain requested info from pt, closing encounter.

## 2024-04-09 ENCOUNTER — Ambulatory Visit: Payer: Self-pay | Attending: Cardiology | Admitting: Cardiology

## 2024-04-09 ENCOUNTER — Telehealth: Payer: Self-pay | Admitting: Pharmacy Technician

## 2024-04-09 ENCOUNTER — Ambulatory Visit: Admitting: Nurse Practitioner

## 2024-04-09 ENCOUNTER — Encounter: Payer: Self-pay | Admitting: Cardiology

## 2024-04-09 VITALS — BP 130/72 | HR 67 | Temp 98.1°F | Ht 64.0 in | Wt 297.4 lb

## 2024-04-09 VITALS — BP 130/80 | HR 67 | Ht 64.0 in | Wt 246.2 lb

## 2024-04-09 DIAGNOSIS — K219 Gastro-esophageal reflux disease without esophagitis: Secondary | ICD-10-CM | POA: Diagnosis not present

## 2024-04-09 DIAGNOSIS — I10 Essential (primary) hypertension: Secondary | ICD-10-CM | POA: Diagnosis not present

## 2024-04-09 DIAGNOSIS — M5441 Lumbago with sciatica, right side: Secondary | ICD-10-CM

## 2024-04-09 DIAGNOSIS — I480 Paroxysmal atrial fibrillation: Secondary | ICD-10-CM | POA: Diagnosis not present

## 2024-04-09 DIAGNOSIS — R350 Frequency of micturition: Secondary | ICD-10-CM | POA: Diagnosis not present

## 2024-04-09 DIAGNOSIS — G8929 Other chronic pain: Secondary | ICD-10-CM

## 2024-04-09 DIAGNOSIS — R221 Localized swelling, mass and lump, neck: Secondary | ICD-10-CM | POA: Diagnosis not present

## 2024-04-09 LAB — POCT URINALYSIS DIPSTICK
Bilirubin, UA: NEGATIVE
Blood, UA: NEGATIVE
Glucose, UA: NEGATIVE
Ketones, UA: NEGATIVE
Leukocytes, UA: NEGATIVE
Nitrite, UA: NEGATIVE
Protein, UA: NEGATIVE
Spec Grav, UA: 1.01 (ref 1.010–1.025)
Urobilinogen, UA: 0.2 U/dL
pH, UA: 7.5 (ref 5.0–8.0)

## 2024-04-09 LAB — COMPREHENSIVE METABOLIC PANEL WITH GFR
ALT: 42 U/L — ABNORMAL HIGH (ref 0–35)
AST: 31 U/L (ref 0–37)
Albumin: 4 g/dL (ref 3.5–5.2)
Alkaline Phosphatase: 81 U/L (ref 39–117)
BUN: 14 mg/dL (ref 6–23)
CO2: 30 meq/L (ref 19–32)
Calcium: 9 mg/dL (ref 8.4–10.5)
Chloride: 105 meq/L (ref 96–112)
Creatinine, Ser: 0.93 mg/dL (ref 0.40–1.20)
GFR: 68.44 mL/min (ref >60.00)
Glucose, Bld: 112 mg/dL — ABNORMAL HIGH (ref 70–99)
Potassium: 3.9 meq/L (ref 3.5–5.1)
Sodium: 140 meq/L (ref 135–145)
Total Bilirubin: 0.5 mg/dL (ref 0.2–1.2)
Total Protein: 7.2 g/dL (ref 6.0–8.3)

## 2024-04-09 LAB — CBC WITH DIFFERENTIAL/PLATELET
Basophils Absolute: 0 K/uL (ref 0.0–0.1)
Basophils Relative: 0.6 % (ref 0.0–3.0)
Eosinophils Absolute: 0.3 K/uL (ref 0.0–0.7)
Eosinophils Relative: 5.1 % — ABNORMAL HIGH (ref 0.0–5.0)
HCT: 39.7 % (ref 36.0–46.0)
Hemoglobin: 13 g/dL (ref 12.0–15.0)
Lymphocytes Relative: 36.7 % (ref 12.0–46.0)
Lymphs Abs: 2.5 K/uL (ref 0.7–4.0)
MCHC: 32.8 g/dL (ref 30.0–36.0)
MCV: 89.1 fl (ref 78.0–100.0)
Monocytes Absolute: 0.7 K/uL (ref 0.1–1.0)
Monocytes Relative: 10 % (ref 3.0–12.0)
Neutro Abs: 3.2 K/uL (ref 1.4–7.7)
Neutrophils Relative %: 47.6 % (ref 43.0–77.0)
Platelets: 193 K/uL (ref 150.0–400.0)
RBC: 4.45 Mil/uL (ref 3.87–5.11)
RDW: 13.4 % (ref 11.5–15.5)
WBC: 6.8 K/uL (ref 4.0–10.5)

## 2024-04-09 MED ORDER — MIRTAZAPINE 15 MG PO TABS: 15.0000 mg | ORAL_TABLET | Freq: Every day | ORAL | 1 refills | Status: DC

## 2024-04-09 MED ORDER — APIXABAN 5 MG PO TABS: 5.0000 mg | ORAL_TABLET | Freq: Two times a day (BID) | ORAL | 3 refills | Status: AC

## 2024-04-09 MED ORDER — SEMAGLUTIDE-WEIGHT MANAGEMENT 1 MG/0.5ML ~~LOC~~ SOAJ
1.0000 mg | SUBCUTANEOUS | 0 refills | Status: DC
Start: 2024-06-06 — End: 2024-05-21

## 2024-04-09 MED ORDER — SEMAGLUTIDE-WEIGHT MANAGEMENT 0.25 MG/0.5ML ~~LOC~~ SOAJ
0.2500 mg | SUBCUTANEOUS | 0 refills | Status: AC
Start: 2024-04-09 — End: 2024-05-07

## 2024-04-09 MED ORDER — PANTOPRAZOLE SODIUM 40 MG PO TBEC: 40.0000 mg | DELAYED_RELEASE_TABLET | Freq: Every day | ORAL | 1 refills | Status: AC

## 2024-04-09 MED ORDER — METOPROLOL SUCCINATE ER 100 MG PO TB24: 100.0000 mg | ORAL_TABLET | Freq: Every day | ORAL | 3 refills | Status: AC

## 2024-04-09 MED ORDER — SEMAGLUTIDE-WEIGHT MANAGEMENT 0.5 MG/0.5ML ~~LOC~~ SOAJ
0.5000 mg | SUBCUTANEOUS | 0 refills | Status: DC
Start: 2024-05-08 — End: 2024-05-21

## 2024-04-09 MED ORDER — SEMAGLUTIDE-WEIGHT MANAGEMENT 2.4 MG/0.75ML ~~LOC~~ SOAJ
2.4000 mg | SUBCUTANEOUS | 0 refills | Status: DC
Start: 2024-08-03 — End: 2024-05-21

## 2024-04-09 MED ORDER — FUROSEMIDE 20 MG PO TABS: 20.0000 mg | ORAL_TABLET | ORAL | Status: DC | PRN

## 2024-04-09 MED ORDER — POTASSIUM CHLORIDE ER 10 MEQ PO TBCR: 10.0000 meq | EXTENDED_RELEASE_TABLET | ORAL | Status: DC | PRN

## 2024-04-09 MED ORDER — SEMAGLUTIDE-WEIGHT MANAGEMENT 1.7 MG/0.75ML ~~LOC~~ SOAJ
1.7000 mg | SUBCUTANEOUS | 0 refills | Status: DC
Start: 2024-07-05 — End: 2024-05-21

## 2024-04-09 NOTE — Telephone Encounter (Signed)
   Pharmacy Patient Advocate Encounter   Received notification from Cover[employer]Meds that prior authorization for wegovy  is required/requested.   Insurance verification completed.   The patient is insured through [employer] .   Per test claim: PA required; PA submitted to above mentioned insurance via Latent Key/confirmation #/EOC [id number] Status is pending

## 2024-04-09 NOTE — Patient Instructions (Addendum)
 Nice to see you today  I will be in touch with the labs once I have them Follow up with me as scheduled If you decide you want an Ultrasound let me know

## 2024-04-09 NOTE — Patient Instructions (Signed)
 Medication Instructions:  - CHANGE lasix  20 to as needed - CHANGE potassium chloride  to as needed Start taking Wegovy   Month 1: 0.25 mg once a week.  Month 2: 0.5 mg once a week. (Call or send us  a MyChart message when you are on week 2, so we can send your next dose in for you).  Month 3: 1 mg once a week.  Month 4: 1.7 mg once a week.  Month 5 and beyond: 2.4 mg once a week (maintenance dose)   *If you need a refill on your cardiac medications before your next appointment, please call your pharmacy*  Lab Work: No labs ordered today  If you have labs (blood work) drawn today and your tests are completely normal, you will receive your results only by: MyChart Message (if you have MyChart) OR A paper copy in the mail If you have any lab test that is abnormal or we need to change your treatment, we will call you to review the results.  Testing/Procedures: No test ordered today   Follow-Up: At [facility], you and your health needs are our priority.  As part of our continuing mission to provide you with exceptional heart care, our providers are all part of one team.  This team includes your primary Cardiologist (physician) and Advanced Practice Providers or APPs (Physician Assistants and Nurse Practitioners) who all work together to provide you with the care you need, when you need it.  Your next appointment:   6 month(s)  Provider:   You may see Redell Cave, MD or one of the following Advanced Practice Providers on your designated Care Team:   Lonni Meager, NP Lesley Maffucci, PA-C Bernardino Bring, PA-C Cadence Antigo, PA-C Tylene Lunch, NP Barnie Hila, NP    We recommend signing up for the patient portal called MyChart.  Sign up information is provided on this After Visit Summary.  MyChart is used to connect with patients for Virtual Visits (Telemedicine).  Patients are able to view lab/test results, encounter notes, upcoming appointments, etc.   Non-urgent messages can be sent to your provider as well.   To learn more about what you can do with MyChart, go to [email or web address].

## 2024-04-09 NOTE — Progress Notes (Addendum)
 Acute Office Visit  Subjective:     Patient ID: Victoria Williamson, female    DOB: Dec 15, 1965, 58 y.o.   MRN: [id number]  Chief Complaint  Patient presents with   Cyst    Pt complains of knot on both sides of cheeks. Pt complains of no pain but states both sides are slightly hard. No sore throat. Swallowing is normal.    Back Pain    Pt complains of chronic back pain. Requests to restart Tramadol  to help with the pain.       Discussed the use of AI scribe software for clinical note transcription with the patient, who gave verbal consent to proceed.  History of Present Illness Victoria Williamson is a 58 year old female who presents with neck swelling and urinary symptoms.  She noticed bilateral neck swelling a couple of days ago, with the right side larger than the left. There is no associated pain, sore throat, drainage, ear pain, fever, chills, or dysphagia. The swelling was discovered upon palpation of her neck.  She experiences urinary symptoms characterized by a sensation of pressure during urination, though it is not painful. She reports increased frequency of urination but denies hematuria or changes in urine odor. She has not taken any over-the-counter medications for these symptoms and has been maintaining adequate hydration.  She has a history of chronic back pain, described as a dull aching pain at the waistline, exacerbated by movement and persistent throughout the day. The pain occasionally radiates down her right leg to the knee and has caused numbness in the left leg in the past. She reports leg weakness, particularly when walking, and attributes some of this to her weight. She has not yet undergone physical therapy due to insurance issues but reports having had an MRI of her back in the past.  Her current medications include Lasix  and potassium for swelling, Eliquis , Abilify , Flonase , metoprolol , and Protonix . She recently saw a cardiologist who prescribed Wegovy   for weight loss, pending insurance approval. She has been out of mirtazapine  for a week, which she uses for sleep, and is considering whether she needs a refill of aripiprazole , as she has a large supply from a previous prescription.  Review of Systems  Constitutional:  Negative for chills and fever.  HENT:  Negative for congestion, ear discharge, ear pain, sinus pain and sore throat.   Respiratory:  Negative for shortness of breath.   Cardiovascular:  Negative for chest pain.  Gastrointestinal:  Negative for abdominal pain, nausea and vomiting.  Genitourinary:  Positive for frequency. Negative for dysuria, hematuria and urgency.  Musculoskeletal:  Positive for back pain.  Neurological:  Negative for headaches.        Objective:    BP 130/72   Pulse 67   Temp 98.1 F (36.7 C) (Oral)   Ht 5' 4 (1.626 m)   SpO2 97%   BMI 42.27 kg/m    Physical Exam Vitals and nursing note reviewed.  Constitutional:      Appearance: Normal appearance.  Cardiovascular:     Rate and Rhythm: Normal rate and regular rhythm.     Heart sounds: Normal heart sounds.  Pulmonary:     Effort: Pulmonary effort is normal.     Breath sounds: Normal breath sounds.  Abdominal:     General: Bowel sounds are normal. There is no distension.     Palpations: There is no mass.     Tenderness: There is no abdominal tenderness. There is no  right CVA tenderness or left CVA tenderness.     Hernia: No hernia is present.  Musculoskeletal:        General: Tenderness present.     Lumbar back: Tenderness present. No bony tenderness. Negative right straight leg raise test and negative left straight leg raise test.       Back:  Neurological:     Mental Status: She is alert.     Results for orders placed or performed in visit on 04/09/24  Urine Culture   Specimen: Blood  Result Value Ref Range   MICRO NUMBER: [id number]    SPECIMEN QUALITY: Adequate    Sample Source NOT GIVEN    STATUS: FINAL    Result: No  Growth   Comprehensive metabolic panel with GFR  Result Value Ref Range   Sodium 140 135 - 145 mEq/L   Potassium 3.9 3.5 - 5.1 mEq/L   Chloride 105 96 - 112 mEq/L   CO2 30 19 - 32 mEq/L   Glucose, Bld 112 (H) 70 - 99 mg/dL   BUN 14 6 - 23 mg/dL   Creatinine, Ser 9.06 0.40 - 1.20 mg/dL   Total Bilirubin 0.5 0.2 - 1.2 mg/dL   Alkaline Phosphatase 81 39 - 117 U/L   AST 31 0 - 37 U/L   ALT 42 (H) 0 - 35 U/L   Total Protein 7.2 6.0 - 8.3 g/dL   Albumin 4.0 3.5 - 5.2 g/dL   GFR 31.55 >39.99 mL/min   Calcium 9.0 8.4 - 10.5 mg/dL  CBC with Differential/Platelet  Result Value Ref Range   WBC 6.8 4.0 - 10.5 K/uL   RBC 4.45 3.87 - 5.11 Mil/uL   Hemoglobin 13.0 12.0 - 15.0 g/dL   HCT 60.2 63.9 - 53.9 %   MCV 89.1 78.0 - 100.0 fl   MCHC 32.8 30.0 - 36.0 g/dL   RDW 86.5 88.4 - 84.4 %   Platelets 193.0 150.0 - 400.0 K/uL   Neutrophils Relative % 47.6 43.0 - 77.0 %   Lymphocytes Relative 36.7 12.0 - 46.0 %   Monocytes Relative 10.0 3.0 - 12.0 %   Eosinophils Relative 5.1 (H) 0.0 - 5.0 %   Basophils Relative 0.6 0.0 - 3.0 %   Neutro Abs 3.2 1.4 - 7.7 K/uL   Lymphs Abs 2.5 0.7 - 4.0 K/uL   Monocytes Absolute 0.7 0.1 - 1.0 K/uL   Eosinophils Absolute 0.3 0.0 - 0.7 K/uL   Basophils Absolute 0.0 0.0 - 0.1 K/uL  POCT urinalysis dipstick  Result Value Ref Range   Color, UA Yellow    Clarity, UA cloudy    Glucose, UA Negative Negative   Bilirubin, UA neg    Ketones, UA neg    Spec Grav, UA 1.010 1.010 - 1.025   Blood, UA neg    pH, UA 7.5 5.0 - 8.0   Protein, UA Negative Negative   Urobilinogen, UA 0.2 0.2 or 1.0 E.U./dL   Nitrite, UA neg    Leukocytes, UA Negative Negative   Appearance     Odor          Assessment & Plan:   Problem List Items Addressed This Visit       Digestive   Gastroesophageal reflux disease without esophagitis   Relevant Medications   pantoprazole  (PROTONIX ) 40 MG tablet   Other Visit Diagnoses       Urinary frequency    -  Primary   Relevant  Orders   POCT urinalysis dipstick (Completed)  Urine Culture (Completed)     Chronic bilateral low back pain with right-sided sciatica       Relevant Medications   mirtazapine  (REMERON ) 15 MG tablet   Other Relevant Orders   Ambulatory referral to Physical Therapy     Mass of neck       Relevant Orders   Comprehensive metabolic panel with GFR (Completed)   CBC with Differential/Platelet (Completed)       Meds ordered this encounter  Medications   pantoprazole  (PROTONIX ) 40 MG tablet    Sig: Take 1 tablet (40 mg total) by mouth daily.    Dispense:  90 tablet    Refill:  1    Supervising Provider:   RANDEEN HARDY A [[id number]]   mirtazapine  (REMERON ) 15 MG tablet    Sig: Take 1 tablet (15 mg total) by mouth at bedtime.    Dispense:  90 tablet    Refill:  1    Supervising Provider:   RANDEEN HARDY A [[id number]]   Assessment and Plan Assessment & Plan Chronic low back pain with right-sided sciatica Chronic bilateral low back pain with left-sided radiculopathy, likely due to disc bulges, epidural lipoma, arthritis, anterolisthesis, and left-sided foraminal narrowing causing nerve impingement. Pain radiates to the right knee with occasional numbness in the left leg and leg weakness. Weight loss and physical therapy recommended over medication. - Resubmit referral for physical therapy to a location that accepts Medicaid. - Encourage weight loss to potentially reduce back pain. - Continue current medications as prescribed.  Urinary frequency and pressure, evaluating for urinary tract infection Urinary frequency and pressure without infection indicated by urinalysis. Culture to rule out urinary tract infection. Advised dietary changes to reduce bladder irritation. - Send urine for culture to rule out infection. - Encourage plenty of fluid intake. - Advise avoiding acidic caffeine, and artificial sweeteners.  Bilateral neck swelling, likely benign salivary gland prominence Bilateral neck  swelling likely due to benign salivary gland prominence. No pain, fever, or systemic symptoms. Differential includes lymphadenopathy, but examination suggests normal anatomy. Blood work to ensure no underlying issues. - Check blood work to assess red and white blood cells and differential. - Offer ultrasound if swelling persists or changes, but currently opting to monitor.   Return if symptoms worsen or fail to improve.  Adina Crandall, NP

## 2024-04-09 NOTE — Progress Notes (Addendum)
 Cardiology Office Note:    Date:  04/09/2024   ID:  Victoria Williamson, DOB 1965/08/10, MRN [id number]  PCP:  [name], NP  [facility] Cardiologist:  Redell Cave, MD  [facility] Electrophysiologist:  OLE ONEIDA HOLTS, MD   Referring MD: [name], NP   Chief Complaint  Patient presents with   2 month follow up     Patient c/o shortness of breath, weakness & bilateral LE edema.      History of Present Illness:    Victoria Williamson is a 58 y.o. female with a hx of hypertension, paroxysmal atrial fibrillation s/p ablation 07/2020, redo ablation, 11/2023 SVT, obesity who presents for follow-up.   Doing okay, has occasional leg edema.  Endorses adequate diuresing with Lasix  but has not taken Lasix  over the past 3 days.  Describes 1 episode of palpitations lasting a few seconds since ablation.  States she would not like to undergo ablation in the future.  Denies any bleeding issues with Eliquis .  Requesting refills.  Takes Toprol -XL 100 mg daily.  Twice daily dosing of Toprol -XL caused significant fatigue.  BP adequately controlled on only Toprol -XL.  She stopped losartan  after ablation procedure 5 months ago.   Prior notes Echo 3/25 EF 65 to 70%, G2DD Echo 07/2021 EF 60 to 65%, borderline ascending aorta dilatation 38 mm. EKG on 03/17/2020 while in the ED showed SVT, heart rate 147.    Review of EMR showed patient had a PET myocardial perfusion stress test 01/2020 with no evidence of ischemia.   Echocardiogram on 12/2019 showed normal systolic function, normal wall thickness, EF 55%, grade 2 diastolic dysfunction.   Had a left heart cath in 2014 and was told she had normal coronary arteries.  Patient states having symptoms of weakness/fatigue, shortness of breath whenever she goes into atrial fibrillation.  Initially diagnosed with atrial fibrillation in February 2021.    Past Medical History:  Diagnosis Date   Anticoagulant long-term use    eliquis ---  managed by cardiology   Arthritis    Ascending aortic aneurysm (HCC) 05/2022   had surgical consult 05-21-2022 w/ wayne gold PA ,   4.0 cm per CT 05-21-2022 in epic, active survillance   Chronic low back pain    DDD (degenerative disc disease), lumbar    Diastolic CHF, chronic (HCC)    followed by cardiology;    last echo 07-27-2021 ef 60-65%   DOE (dyspnea on exertion)    Dysautonomia (HCC) 12/18/2013   dx by cardiologist--- dr fernande   [phone]  pt stated no issues in passed few yrs)   Edema of both lower extremities    intermittant per pt   Fatty liver    GAD (generalized anxiety disorder)    History of adenomatous polyp of colon    Hx of skin cancer, basal cell    Hypertension    MDD (major depressive disorder)    PAF (paroxysmal atrial fibrillation) (HCC) 09/2019   cardiologist-  dr b. agbor-etang/  EP cardiologist-- dr holts   PMB (postmenopausal bleeding)    PONV (postoperative nausea and vomiting)    Pre-diabetes    PSVT (paroxysmal supraventricular tachycardia) (HCC)    Pulmonary emphysema (HCC)    followed by pcp   Pulmonary nodule    followed by pcp   Spondylolisthesis of lumbar region     Past Surgical History:  Procedure Laterality Date   ATRIAL FIBRILLATION ABLATION N/A 07/15/2020   Procedure: ATRIAL FIBRILLATION ABLATION;  Surgeon:  Cindie Ole DASEN, MD;  Location: [facility] INVASIVE CV LAB;  Service: Cardiovascular;  Laterality: N/A;   ATRIAL FIBRILLATION ABLATION N/A 11/29/2023   Procedure: ATRIAL FIBRILLATION ABLATION;  Surgeon: Cindie Ole DASEN, MD;  Location: [facility] INVASIVE CV LAB;  Service: Cardiovascular;  Laterality: N/A;   BREAST BIOPSY Right 07/17/2023   MM RT BREAST BX W LOC DEV 1ST LESION IMAGE BX SPEC STEREO GUIDE 07/17/2023 [facility]MOGR[facility]   CARDIAC CATHETERIZATION  06/03/2013   @[facility]  by dr bosie;  per pt was told normal coronary arteries   CARDIOVERSION N/A 05/11/2020   Procedure: CARDIOVERSION;  Surgeon: Darliss Rogue, MD;  Location: [facility]  ORS;  Service: Cardiovascular;  Laterality: N/A;   CARPAL TUNNEL RELEASE Left 06/23/2020   Procedure: CARPAL TUNNEL RELEASE;  Surgeon: Murrell Kuba, MD;  Location: [facility];  Service: Orthopedics;  Laterality: Left;  IV REGIONAL FOREARM BLOCK   CARPAL TUNNEL RELEASE Right 2000   COLONOSCOPY  2020   HYSTEROSCOPY W/ ENDOMETRIAL ABLATION  02/23/2005   @[facility] ;  w/ novasure   HYSTEROSCOPY WITH D & C N/A 07/24/2022   Procedure: ATTEMPTED DILATATION AND CURETTAGE /HYSTEROSCOPY;  Surgeon: Cleotilde Ronal RAMAN, MD;  Location: [facility];  Service: Gynecology;  Laterality: N/A;   OPERATIVE ULTRASOUND N/A 07/24/2022   Procedure: OPERATIVE ULTRASOUND;  Surgeon: Cleotilde Ronal RAMAN, MD;  Location: [facility];  Service: Gynecology;  Laterality: N/A;   REVISION AMPUTATION OF FINGER Left 12/17/2000   @[facility]  by dr sypher;  injury  left little finger   TUBAL LIGATION Bilateral    yrs ago    Current Medications: Current Meds  Medication Sig   ARIPiprazole  (ABILIFY ) 5 MG tablet Take 5 mg by mouth daily.   clobetasol  ointment (TEMOVATE ) 0.05 % Apply to affected area every night for 4 weeks, then every other day for 4 weeks and then twice a week for 4 weeks or until resolution.   fluticasone  (FLONASE ) 50 MCG/ACT nasal spray Place 1 spray into both nostrils daily as needed for allergies.   loratadine  (CLARITIN ) 10 MG tablet Take 10 mg by mouth daily as needed for allergies.   mirtazapine  (REMERON ) 15 MG tablet Take 15 mg by mouth at bedtime.   pantoprazole  (PROTONIX ) 40 MG tablet TAKE 1 TABLET BY MOUTH EVERY DAY   semaglutide -weight management (WEGOVY ) 0.25 MG/0.5ML SOAJ SQ injection Inject 0.25 mg into the skin once a week for 28 days.   [START ON 05/08/2024] semaglutide -weight management (WEGOVY ) 0.5 MG/0.5ML SOAJ SQ injection Inject 0.5 mg into the skin once a week for 28 days.   [START ON 06/06/2024] semaglutide -weight management (WEGOVY ) 1 MG/0.5ML SOAJ SQ injection Inject 1 mg  into the skin once a week for 28 days.   [START ON 07/05/2024] semaglutide -weight management (WEGOVY ) 1.7 MG/0.75ML SOAJ SQ injection Inject 1.7 mg into the skin once a week for 28 days.   [START ON 08/03/2024] semaglutide -weight management (WEGOVY ) 2.4 MG/0.75ML SOAJ SQ injection Inject 2.4 mg into the skin once a week for 28 days.   [DISCONTINUED] apixaban  (ELIQUIS ) 5 MG TABS tablet TAKE 1 TABLET BY MOUTH TWICE A DAY   [DISCONTINUED] metoprolol  succinate (TOPROL -XL) 100 MG 24 hr tablet Take 1 tablet (100 mg total) by mouth 2 (two) times daily. Take with or immediately following a meal. (Patient taking differently: Take 100 mg by mouth daily. Take with or immediately following a meal.)     Allergies:   Codeine, Meloxicam, Mushroom, and Paroxetine hcl   Social History   Socioeconomic History  Marital status: Married    Spouse name: Lamar   Number of children: 2   Years of education: Not on file   Highest education level: 12th grade  Occupational History   Not on file  Tobacco Use   Smoking status: Former    Current packs/day: 0.00    Average packs/day: 1 pack/day for 37.0 years (37.0 ttl pk-yrs)    Types: Cigarettes    Start date: 12/29/1982    Quit date: 12/29/2019    Years since quitting: 4.2   Smokeless tobacco: Never  Vaping Use   Vaping status: Every Day   Substances: Nicotine   Devices: juul  Substance and Sexual Activity   Alcohol use: Not Currently   Drug use: Never   Sexual activity: Yes    Partners: Male    Birth control/protection: Post-menopausal  Other Topics Concern   Not on file  Social History Narrative   Lives at home with husband      Fulltime: Designer, industrial/product   Social Drivers of Health   Financial Resource Strain: Low Risk  (01/17/2024)   Overall Financial Resource Strain (CARDIA)    Difficulty of Paying Living Expenses: Not very hard  Food Insecurity: No Food Insecurity (01/17/2024)   Hunger Vital Sign    Worried About Running Out of Food in the Last  Year: Never true    Ran Out of Food in the Last Year: Never true  Transportation Needs: No Transportation Needs (01/17/2024)   PRAPARE - Administrator, Civil Service (Medical): No    Lack of Transportation (Non-Medical): No  Physical Activity: Inactive (01/17/2024)   Exercise Vital Sign    Days of Exercise per Week: 0 days    Minutes of Exercise per Session: Not on file  Stress: Stress Concern Present (01/17/2024)   [employer] of Occupational Health - Occupational Stress Questionnaire    Feeling of Stress: Very much  Social Connections: Moderately Integrated (01/17/2024)   Social Connection and Isolation Panel    Frequency of Communication with Friends and Family: More than three times a week    Frequency of Social Gatherings with Friends and Family: Once a week    Attends Religious Services: More than 4 times per year    Active Member of [employer] or Organizations: No    Attends Engineer, structural: Not on file    Marital Status: Married     Family History: The patient's family history includes Alcohol abuse in her father; Arthritis in her mother; Breast cancer (age of onset: 38) in her maternal aunt; Breast cancer (age of onset: 66) in her maternal grandmother; COPD in her mother; Cancer (age of onset: 36) in her father; Depression in her paternal grandmother; Diabetes in her mother and another family member; Hearing loss in her father; Heart attack in her father and paternal grandfather; Heart attack (age of onset: 29) in her brother; Heart disease in her father, mother, and paternal grandfather; High Cholesterol in her father and mother; Hypertension in her father and mother; Kidney disease in her mother.  ROS:   Please see the history of present illness.     All other systems reviewed and are negative.  EKGs/Labs/Other Studies Reviewed:    The following studies were reviewed today:   EKG Interpretation Date/Time:  Thursday April 09 2024 08:32:28  EDT Ventricular Rate:  67 PR Interval:  168 QRS Duration:  92 QT Interval:  406 QTC Calculation: 429 R Axis:   42  Text Interpretation:  Normal sinus rhythm Normal ECG Confirmed by Darliss Rogue ([id number]) on 04/09/2024 8:40:47 AM    Recent Labs: 11/21/2023: ALT 49; Magnesium 2.1; TSH 3.173 02/20/2024: Hemoglobin 13.1; Platelets 203 02/27/2024: BUN 14; Creatinine, Ser 1.01; Potassium 4.0; Sodium 142  Recent Lipid Panel    Component Value Date/Time   CHOL 135 01/02/2023 1633   TRIG 161.0 (H) 01/02/2023 1633   HDL 34.00 (L) 01/02/2023 1633   CHOLHDL 4 01/02/2023 1633   VLDL 32.2 01/02/2023 1633   LDLCALC 68 01/02/2023 1633   LDLCALC 97 10/20/2021 1529    Physical Exam:    VS:  BP 130/80 (BP Location: Left Arm, Patient Position: Sitting, Cuff Size: Normal)   Pulse 67   Ht 5' 4 (1.626 m)   Wt 246 lb 4 oz (111.7 kg)   SpO2 97%   BMI 42.27 kg/m     Wt Readings from Last 3 Encounters:  04/09/24 246 lb 4 oz (111.7 kg)  02/27/24 246 lb 9.6 oz (111.9 kg)  02/04/24 242 lb (109.8 kg)     GEN:  Well nourished, well developed in no acute distress HEENT: Normal NECK: No JVD; No carotid bruits CARDIAC: Regular rate and rhythm, no murmurs RESPIRATORY:  Clear to auscultation without rales, wheezing or rhonchi  ABDOMEN: Soft, non-tender, non-distended MUSCULOSKELETAL:  trace edema; No deformity  SKIN: Warm and dry NEUROLOGIC:  Alert and oriented x 3 PSYCHIATRIC:  Normal affect   ASSESSMENT:    1. Paroxysmal atrial fibrillation (HCC)   2. Primary hypertension   3. Morbid obesity (HCC)    PLAN:    In order of problems listed above:  paroxysmal atrial fibrillation, s/p ablation 07/2020, redo ablation 11/2023.  Currently in sinus rhythm.  Occasional palpitations, possibly SVT.  Repeat ablation being planned.  CHA2DS2-VASc of 2. continue Toprol -XL 100 mg daily, Eliquis  5 mg twice daily.   hypertension, controlled. continue Toprol -XL 100 mg daily.  Stop losartan . Morbid obesity,  low-calorie diet, weight loss advised.  Start Wegovy . Patient will continue lifestyle modification including structured nutrition and physical activity.  Obesity likely contributing to leg edema.  Continue Lasix  20 mg daily as needed. Refer patient to dietician.  Follow-up in 6 months  Medication Adjustments/Labs and Tests Ordered: Current medicines are reviewed at length with the patient today.  Concerns regarding medicines are outlined above.  Orders Placed This Encounter  Procedures   EKG 12-Lead    Meds ordered this encounter  Medications   semaglutide -weight management (WEGOVY ) 0.25 MG/0.5ML SOAJ SQ injection    Sig: Inject 0.25 mg into the skin once a week for 28 days.    Dispense:  2 mL    Refill:  0   semaglutide -weight management (WEGOVY ) 0.5 MG/0.5ML SOAJ SQ injection    Sig: Inject 0.5 mg into the skin once a week for 28 days.    Dispense:  2 mL    Refill:  0   semaglutide -weight management (WEGOVY ) 1 MG/0.5ML SOAJ SQ injection    Sig: Inject 1 mg into the skin once a week for 28 days.    Dispense:  2 mL    Refill:  0   semaglutide -weight management (WEGOVY ) 1.7 MG/0.75ML SOAJ SQ injection    Sig: Inject 1.7 mg into the skin once a week for 28 days.    Dispense:  3 mL    Refill:  0   semaglutide -weight management (WEGOVY ) 2.4 MG/0.75ML SOAJ SQ injection    Sig: Inject 2.4 mg into the skin once a week for 28  days.    Dispense:  3 mL    Refill:  0   furosemide  (LASIX ) 20 MG tablet    Sig: Take 1 tablet (20 mg total) by mouth as needed.   potassium chloride  (KLOR-CON ) 10 MEQ tablet    Sig: Take 1 tablet (10 mEq total) by mouth as needed for up to 365 doses.   apixaban  (ELIQUIS ) 5 MG TABS tablet    Sig: Take 1 tablet (5 mg total) by mouth 2 (two) times daily.    Dispense:  180 tablet    Refill:  3    90 day supply   metoprolol  succinate (TOPROL -XL) 100 MG 24 hr tablet    Sig: Take 1 tablet (100 mg total) by mouth daily. Take with or immediately following a meal.     Dispense:  30 tablet    Refill:  3     Patient Instructions  Medication Instructions:  - CHANGE lasix  20 to as needed - CHANGE potassium chloride  to as needed Start taking Wegovy   Month 1: 0.25 mg once a week.  Month 2: 0.5 mg once a week. (Call or send us  a MyChart message when you are on week 2, so we can send your next dose in for you).  Month 3: 1 mg once a week.  Month 4: 1.7 mg once a week.  Month 5 and beyond: 2.4 mg once a week (maintenance dose)   *If you need a refill on your cardiac medications before your next appointment, please call your pharmacy*  Lab Work: No labs ordered today  If you have labs (blood work) drawn today and your tests are completely normal, you will receive your results only by: MyChart Message (if you have MyChart) OR A paper copy in the mail If you have any lab test that is abnormal or we need to change your treatment, we will call you to review the results.  Testing/Procedures: No test ordered today   Follow-Up: At [facility], you and your health needs are our priority.  As part of our continuing mission to provide you with exceptional heart care, our providers are all part of one team.  This team includes your primary Cardiologist (physician) and Advanced Practice Providers or APPs (Physician Assistants and Nurse Practitioners) who all work together to provide you with the care you need, when you need it.  Your next appointment:   6 month(s)  Provider:   You may see Redell Cave, MD or one of the following Advanced Practice Providers on your designated Care Team:   Lonni Meager, NP Lesley Maffucci, PA-C Bernardino Bring, PA-C Cadence Americus, PA-C Tylene Lunch, NP Barnie Hila, NP    We recommend signing up for the patient portal called MyChart.  Sign up information is provided on this After Visit Summary.  MyChart is used to connect with patients for Virtual Visits (Telemedicine).  Patients are able to view  lab/test results, encounter notes, upcoming appointments, etc.  Non-urgent messages can be sent to your provider as well.   To learn more about what you can do with MyChart, go to [email or web address].         Signed, Redell Cave, MD  04/09/2024 10:08 AM    [facility]

## 2024-04-10 ENCOUNTER — Encounter: Payer: Self-pay | Admitting: Nurse Practitioner

## 2024-04-10 LAB — URINE CULTURE
MICRO NUMBER:: 16923171
Result:: NO GROWTH
SPECIMEN QUALITY:: ADEQUATE

## 2024-04-10 NOTE — Telephone Encounter (Signed)
 Sent more information to ins. Her fam med dr had her as 297 weight as of yesterday and we had 246. She said her weight is 246

## 2024-04-13 ENCOUNTER — Ambulatory Visit: Payer: Self-pay | Admitting: Nurse Practitioner

## 2024-04-13 ENCOUNTER — Other Ambulatory Visit (HOSPITAL_COMMUNITY): Payer: Self-pay

## 2024-04-14 ENCOUNTER — Other Ambulatory Visit (HOSPITAL_COMMUNITY): Payer: Self-pay

## 2024-04-15 NOTE — Telephone Encounter (Signed)
 Pharmacy Patient Advocate Encounter  Received notification from [employer] that Prior Authorization for wegovy  0.25mg  has been DENIED.  Full denial letter will be uploaded to the media tab. See denial reason below.   PA #/Case ID/Reference #: I called insurance and they said they didn't like that 2 different doctors had 2 different weights on the same day even though we told them that the 246 weight was right. Also they said it had to be documented that she is currently on and will continue lifestyle modification including structured nutrition and physical activity   Wegovy  denied due to missing documentation of recent baseline weight/BMI and lifestyle modifications per GLP1s Weight Management criteria.

## 2024-04-16 NOTE — Telephone Encounter (Signed)
 I removed the weight from out encounter since we are unsure of it accuracy

## 2024-04-17 ENCOUNTER — Encounter: Payer: Self-pay | Admitting: Cardiology

## 2024-04-20 NOTE — Telephone Encounter (Signed)
 Pharmacy Patient Advocate Encounter  Received notification from [employer] that Prior Authorization for wegovy  0.25mg  has been APPROVED from 04/20/24 to 10/17/24. Spoke to pharmacy to process.Copay is $4.00.      I called the patient and left her a message

## 2024-04-20 NOTE — Telephone Encounter (Signed)
 Faxed updated documentation 04/20/24   I called the patient and asked her to send me the copy of the denial from insurance once she receives for the appeal

## 2024-04-22 NOTE — Telephone Encounter (Signed)
 Patient called back and is aware.

## 2024-04-24 ENCOUNTER — Encounter: Payer: Self-pay | Admitting: Nurse Practitioner

## 2024-04-27 ENCOUNTER — Encounter: Payer: Self-pay | Admitting: Acute Care

## 2024-04-28 NOTE — Therapy (Signed)
 OUTPATIENT PHYSICAL THERAPY THORACOLUMBAR EVALUATION   Patient Name: Victoria Williamson MRN: [id number] DOB:[birth year], 58 y.o., female Today's Date: 04/29/2024  END OF SESSION:  PT End of Session - 04/29/24 1431     Visit Number 1    Number of Visits 17    Date for Recertification  06/24/24    PT Start Time 1432    PT Stop Time 1514    PT Time Calculation (min) 42 min    Activity Tolerance Patient tolerated treatment well    Behavior During Therapy [facility] for tasks assessed/performed          Past Medical History:  Diagnosis Date   Anticoagulant long-term use    eliquis --- managed by cardiology   Arthritis    Ascending aortic aneurysm 05/2022   had surgical consult 05-21-2022 w/ wayne gold PA ,   4.0 cm per CT 05-21-2022 in epic, active survillance   Chronic low back pain    DDD (degenerative disc disease), lumbar    Diastolic CHF, chronic (HCC)    followed by cardiology;    last echo 07-27-2021 ef 60-65%   DOE (dyspnea on exertion)    Dysautonomia (HCC) 12/18/2013   dx by cardiologist--- dr fernande   [phone]  pt stated no issues in passed few yrs)   Edema of both lower extremities    intermittant per pt   Fatty liver    GAD (generalized anxiety disorder)    History of adenomatous polyp of colon    Hx of skin cancer, basal cell    Hypertension    MDD (major depressive disorder)    PAF (paroxysmal atrial fibrillation) (HCC) 09/2019   cardiologist-  dr b. agbor-etang/  EP cardiologist-- dr cindie   PMB (postmenopausal bleeding)    PONV (postoperative nausea and vomiting)    Pre-diabetes    PSVT (paroxysmal supraventricular tachycardia)    Pulmonary emphysema (HCC)    followed by pcp   Pulmonary nodule    followed by pcp   Spondylolisthesis of lumbar region    Past Surgical History:  Procedure Laterality Date   ATRIAL FIBRILLATION ABLATION N/A 07/15/2020   Procedure: ATRIAL FIBRILLATION ABLATION;  Surgeon: cindie Ole DASEN, MD;  Location: [facility];  Service: Cardiovascular;  Laterality: N/A;   ATRIAL FIBRILLATION ABLATION N/A 11/29/2023   Procedure: ATRIAL FIBRILLATION ABLATION;  Surgeon: cindie Ole DASEN, MD;  Location: [facility];  Service: Cardiovascular;  Laterality: N/A;   BREAST BIOPSY Right 07/17/2023   MM RT BREAST BX W LOC DEV 1ST LESION IMAGE BX SPEC STEREO GUIDE 07/17/2023 [facility]   CARDIAC CATHETERIZATION  06/03/2013   @[facility]  by dr bosie;  per pt was told normal coronary arteries   CARDIOVERSION N/A 05/11/2020   Procedure: CARDIOVERSION;  Surgeon: Darliss Rogue, MD;  Location: [facility];  Service: Cardiovascular;  Laterality: N/A;   CARPAL TUNNEL RELEASE Left 06/23/2020   Procedure: CARPAL TUNNEL RELEASE;  Surgeon: Murrell Kuba, MD;  Location: [facility];  Service: Orthopedics;  Laterality: Left;  IV REGIONAL FOREARM BLOCK   CARPAL TUNNEL RELEASE Right 2000   COLONOSCOPY  2020   HYSTEROSCOPY W/ ENDOMETRIAL ABLATION  02/23/2005   @[facility] ;  w/ novasure   HYSTEROSCOPY WITH D & C N/A 07/24/2022   Procedure: ATTEMPTED DILATATION AND CURETTAGE /HYSTEROSCOPY;  Surgeon: Cleotilde Ronal RAMAN, MD;  Location: [facility];  Service: Gynecology;  Laterality: N/A;   OPERATIVE ULTRASOUND N/A 07/24/2022   Procedure: OPERATIVE ULTRASOUND;  Surgeon: Cleotilde,  Ronal RAMAN, MD;  Location: [facility];  Service: Gynecology;  Laterality: N/A;   REVISION AMPUTATION OF FINGER Left 12/17/2000   @[facility]  by dr sypher;  injury  left little finger   TUBAL LIGATION Bilateral    yrs ago   Patient Active Problem List   Diagnosis Date Noted   Atrial flutter (HCC) 02/27/2024   Atrial fibrillation (HCC) 11/29/2023   Flutter-fibrillation (HCC) 11/21/2023   Palpitations 11/21/2023   Hypercoagulable state due to paroxysmal atrial fibrillation (HCC) 12/22/2022   History of endometrial ablation 08/07/2022   Postmenopausal bleeding 08/07/2022   Cervical stenosis (uterine cervix) 05/29/2022   Vulvar  irritation 05/29/2022   Endometrial mass 05/29/2022   Ascending aorta dilatation 05/29/2022   Paraspinal mass 05/29/2022   Prediabetes 05/18/2022   Other fatigue 05/18/2022   Shortness of breath 05/18/2022   Pulmonary emphysema (HCC) 05/18/2022   Esophageal dysphagia 05/18/2022   Right conjunctivitis 04/23/2022   Hematuria 12/01/2021   Abnormal uterine bleeding (AUB) 12/01/2021   Former smoker 10/20/2021   Chronic bilateral low back pain without sciatica 05/15/2021   COVID-19 04/25/2021   Obesity (BMI 30-39.9) 03/17/2021   Bipolar depression (HCC) 03/17/2021   Preventative health care 03/17/2021   Vitamin D deficiency 03/17/2021   Skin lesion 03/17/2021   SVT (supraventricular tachycardia) 11/23/2020   Arthritis    Carpal tunnel syndrome of left wrist 02/04/2020   Chronic heart failure with preserved ejection fraction (HCC) 01/11/2020   Hepatic steatosis 12/18/2019   High serum thyroid  stimulating hormone (TSH) 12/18/2019   Lower extremity edema 12/17/2019   Neck swelling 12/17/2019   Paroxysmal atrial fibrillation (HCC) 12/17/2019   Lumbar radiculitis 01/16/2019   Tubular adenoma 08/22/2018   B12 deficiency 01/14/2018   Essential hypertension 05/19/2014   Gastroesophageal reflux disease without esophagitis 05/19/2014   Vasovagal syndrome 12/25/2013   Anxiety and depression 12/20/2013   Dysautonomia (HCC) 12/18/2013    PCP: [name], NP  REFERRING PROVIDER: [name], NP  REFERRING DIAG:  G89.29,M54.41 (ICD-10-CM) - Chronic bilateral low back pain with right-sided sciatica  Rationale for Evaluation and Treatment: Rehabilitation  THERAPY DIAG:  Other low back pain  Muscle weakness (generalized)  ONSET DATE: chronic  SUBJECTIVE:                                                                                                                                                                                           SUBJECTIVE STATEMENT: Patient reports  she is coming to therapy for her back pain and her MRI showed pinched nerve on the L side. Intermittent pain down her L leg and to R knee.  Patient reports she had early retirement due to not being able to complete her job duties (heavy lifting, walking, squatting, etc.)   PERTINENT HISTORY:  Per MD note on 04/09/24, She has a history of chronic back pain, described as a dull aching pain at the waistline, exacerbated by movement and persistent throughout the day. The pain occasionally radiates down her right leg to the knee and has caused numbness in the left leg in the past. She reports leg weakness, particularly when walking, and attributes some of this to her weight.   PAIN:  Are you having pain? Yes: NPRS scale: 1-2/10; worst 10/10  Pain location: low back  Pain description: dull, ache, constant Aggravating factors: walking, lifting, standing >10-15 minutes  Relieving factors: sitting  PRECAUTIONS: None  RED FLAGS: None   WEIGHT BEARING RESTRICTIONS: No  FALLS:  Has patient fallen in last 6 months? No  LIVING ENVIRONMENT: Lives with: lives with their spouse Lives in: House/apartment Stairs: Yes: External: 4 steps; can reach both Has following equipment at home: None  OCCUPATION: retired   PLOF: Independent  PATIENT GOALS: to relieve some of the pain and be more mobile   OBJECTIVE:  Note: Objective measures were completed at Evaluation unless otherwise noted.  DIAGNOSTIC FINDINGS:  MRI SPINE LUMBAR WO CONTRAST Collected on Feb 26, 2024 9:45 AM  IMPRESSION: Mild degenerative changes of the lumbar spine.  PATIENT SURVEYS:  Modified Oswestry:  MODIFIED OSWESTRY DISABILITY SCALE  Date: 04/29/24 Score  Pain intensity 1 = The pain is bad, but I can manage without having to take (1) I can stand as long as I want but, it increases my pain. pain medication.  2. Personal care (washing, dressing, etc.) 0 =  I can take care of myself normally without causing increased pain.  3.  Lifting 2 = Pain prevents me from lifting heavy weights off the floor, activities (eg. sports, dancing). but I can manage if the weights are conveniently positioned (3) Pain prevents me from going out very often. (eg, on a table).  4. Walking 3 =  Pain prevents me from walking more than  mile.  5. Sitting 0 =  I can sit in any chair as long as I like.  6. Standing 4 =  Pain prevents me from standing more than 10 minutes.  7. Sleeping 5 =  Pain prevents me from sleeping at all.  8. Social Life 1 =  My social life is normal, but it increases my level of pain.  9. Traveling 0 =  I can travel anywhere without increased pain.  10. Employment/ Homemaking 5 = Pain prevents me from performing any job or homemaking chores.  Total 21/50 = 42%   Interpretation of scores: Score Category Description  0-20% Minimal Disability The patient can cope with most living activities. Usually no treatment is indicated apart from advice on lifting, sitting and exercise  21-40% Moderate Disability The patient experiences more pain and difficulty with sitting, lifting and standing. Travel and social life are more difficult and they may be disabled from work. Personal care, sexual activity and sleeping are not grossly affected, and the patient can usually be managed by conservative means  41-60% Severe Disability Pain remains the main problem in this group, but activities of daily living are affected. These patients require a detailed investigation  61-80% Crippled Back pain impinges on all aspects of the patient's life. Positive intervention is required  81-100% Bed-bound  These patients are either bed-bound or exaggerating their  symptoms  Bluford BRAVO, [place] DELENA Karon DELENA, et al. Surgery versus conservative management of stable thoracolumbar fracture: the PRESTO feasibility RCT. [place]): [employer]; 2021 Nov. [facility] Technology Assessment, No. 25.62.) Appendix 3, Oswestry Disability Index category  descriptors. Available from: FindJewelers.cz  Minimally Clinically Important Difference (MCID) = 12.8%  COGNITION: Overall cognitive status: Within functional limits for tasks assessed     SENSATION: WFL  MUSCLE LENGTH: Hamstrings: Right 40 deg; Left 45 deg  POSTURE: rounded shoulders and forward head  PALPATION: Tightness noted to lumbar paraspinals (L>R), piriformis bilaterally TTP over spinous process of lumbar spine during CPAs  LUMBAR ROM:   AROM eval  Flexion Fingertips to mid shin   Extension 25%   Right lateral flexion WFL  Left lateral flexion WFL*  Right rotation 50%  Left rotation 50%*   (Blank rows = not tested)  LOWER EXTREMITY ROM:     WFL  LOWER EXTREMITY MMT:    MMT Right eval Left eval  Hip flexion 4+ 4+  Hip extension    Hip abduction 4+ 4+  Hip adduction 4+ 4+  Hip internal rotation    Hip external rotation    Knee flexion 4+ 4+  Knee extension 4+ 4+  Ankle dorsiflexion 4 4  Ankle plantarflexion    Ankle inversion    Ankle eversion     (Blank rows = not tested)  LUMBAR SPECIAL TESTS:  Straight leg raise test: Negative and FABER test: Negative  FUNCTIONAL TESTS:  5 times sit to stand: 21.85 seconds 6 minute walk test: to be completed visit #2  10 meter walk test: to be completed visit #2   GAIT: Distance walked: 15' Assistive device utilized: None Level of assistance: Complete Independence Comments: guarded gait, no other obvious gait deficits   TREATMENT DATE: 04/29/24                                                                                                                                 PATIENT EDUCATION:  Education details: HEP, POC, goals  Person educated: Patient Education method: Explanation, Demonstration, and Handouts Education comprehension: verbalized understanding and returned demonstration  HOME EXERCISE PROGRAM: Access Code: [email or web address] URL:  [email or web address] Date: 04/29/2024 Prepared by: Maryanne Finder  Exercises - Seated Piriformis Stretch  - 2-3 x daily - 5-7 x weekly - 3-5 reps - 30-60 seconds hold - Seated Hamstring Stretch  - 2-3 x daily - 5-7 x weekly - 3-5 reps - 30-60 seconds hold - Supine Lower Trunk Rotation  - 2-3 x daily - 5-7 x weekly - 3 sets - 10 reps  ASSESSMENT:  CLINICAL IMPRESSION: Patient is a 58 y.o. female who was seen today for physical therapy evaluation and treatment for low back pain with intermittent pain in LLE. Patient demonstrates BLE weakness, decreased activity tolerance, decreased lumbar ROM, and pain. Patient with muscular tightness in hamstrings, piriformis, and lumbar paraspinals (L>R). HEP initiated with stretching focused  on low back rotation, hamstring, and piriformis. Patient with 42% on MODI indicative of severe disability related to her back pain. Patient will benefit from skilled PT services to address listed impairments to improve quality of life and decrease pain.    OBJECTIVE IMPAIRMENTS: Abnormal gait, decreased activity tolerance, decreased endurance, decreased mobility, difficulty walking, decreased strength, impaired flexibility, postural dysfunction, and pain.   ACTIVITY LIMITATIONS: lifting, bending, standing, squatting, stairs, and locomotion level  PARTICIPATION LIMITATIONS: cleaning, shopping, community activity, and occupation  PERSONAL FACTORS: Age, Behavior pattern, Past/current experiences, and Time since onset of injury/illness/exacerbation are also affecting patient's functional outcome.   REHAB POTENTIAL: Good  CLINICAL DECISION MAKING: Stable/uncomplicated  EVALUATION COMPLEXITY: Moderate   GOALS: Goals reviewed with patient? Yes  SHORT TERM GOALS: Target date: 05/27/2024  Patient will be independent in HEP to improve strength/mobility for better functional independence with ADLs.  Baseline: Goal status: INITIAL   LONG TERM GOALS:  Target date: 06/24/2024  Patient will reduce modified Oswestry score to <20 as to demonstrate minimal disability with ADLs including improved sleeping tolerance, walking/sitting tolerance etc for better mobility with ADLs. Baseline:  Goal status: INITIAL  2.  Patient will report a worst pain of 3/10 on NRPS in low back to improve tolerance with ADLs and reduced symptoms with activities.  Baseline: 04/29/24: 10/10 Goal status: INITIAL  3.  Patient (< 30 years old) will complete five times sit to stand test in < 10 seconds indicating an increased LE strength and improved balance.  Baseline:  Goal status: INITIAL  4.  Patient will improve by 41m (164') in order to demonstrate clinically significant improvement in cardiopulmonary endurance and community ambulation  Baseline:  Goal status: INITIAL  5.  Patient will tolerate standing >30 minutes to demonstrate improved tolerance to activity to be able to complete iADLs at home.  Baseline: 04/29/24: unable to tolerate >10-15 minutes  Goal status: INITIAL   PLAN:  PT FREQUENCY: 1-2x/week  PT DURATION: 8 weeks  PLANNED INTERVENTIONS: [id number]- PT Re-evaluation, 97750- Physical Performance Testing, 97110-Therapeutic exercises, 97530- Therapeutic activity, 97112- Neuromuscular re-education, 97535- Self Care, [id number]- Manual therapy, [phone]- Gait training, [phone]- Electrical stimulation (unattended), [phone]- Electrical stimulation (manual), Patient/Family education, Balance training, Stair training, Joint mobilization, Joint manipulation, Spinal manipulation, Spinal mobilization, Cryotherapy, and Moist heat.  PLAN FOR NEXT SESSION: HEP review, , core strengthening, stretching    Maryanne Finder, PT, DPT Physical Therapist - [place]  [facility] 04/29/2024, 3:16 PM

## 2024-04-29 ENCOUNTER — Ambulatory Visit: Attending: Nurse Practitioner

## 2024-04-29 DIAGNOSIS — M5441 Lumbago with sciatica, right side: Secondary | ICD-10-CM | POA: Insufficient documentation

## 2024-04-29 DIAGNOSIS — M6281 Muscle weakness (generalized): Secondary | ICD-10-CM | POA: Diagnosis present

## 2024-04-29 DIAGNOSIS — G8929 Other chronic pain: Secondary | ICD-10-CM | POA: Diagnosis not present

## 2024-04-29 DIAGNOSIS — M5459 Other low back pain: Secondary | ICD-10-CM | POA: Diagnosis present

## 2024-04-30 NOTE — Therapy (Signed)
 OUTPATIENT PHYSICAL THERAPY THORACOLUMBAR TREATMENT   Patient Name: Victoria Williamson MRN: [id number] DOB:1965/11/18, 58 y.o., female Today's Date: 05/04/2024  END OF SESSION:  PT End of Session - 05/04/24 0814     Visit Number 2    Number of Visits 17    Date for Recertification  06/24/24    PT Start Time 0815    PT Stop Time 0857    PT Time Calculation (min) 42 min    Activity Tolerance Patient tolerated treatment well    Behavior During Therapy [facility] for tasks assessed/performed           Past Medical History:  Diagnosis Date   Anticoagulant long-term use    eliquis --- managed by cardiology   Arthritis    Ascending aortic aneurysm 05/2022   had surgical consult 05-21-2022 w/ wayne gold PA ,   4.0 cm per CT 05-21-2022 in epic, active survillance   Chronic low back pain    DDD (degenerative disc disease), lumbar    Diastolic CHF, chronic (HCC)    followed by cardiology;    last echo 07-27-2021 ef 60-65%   DOE (dyspnea on exertion)    Dysautonomia (HCC) 12/18/2013   dx by cardiologist--- dr fernande   [phone]  pt stated no issues in passed few yrs)   Edema of both lower extremities    intermittant per pt   Fatty liver    GAD (generalized anxiety disorder)    History of adenomatous polyp of colon    Hx of skin cancer, basal cell    Hypertension    MDD (major depressive disorder)    PAF (paroxysmal atrial fibrillation) (HCC) 09/2019   cardiologist-  dr b. agbor-etang/  EP cardiologist-- dr cindie   PMB (postmenopausal bleeding)    PONV (postoperative nausea and vomiting)    Pre-diabetes    PSVT (paroxysmal supraventricular tachycardia)    Pulmonary emphysema (HCC)    followed by pcp   Pulmonary nodule    followed by pcp   Spondylolisthesis of lumbar region    Past Surgical History:  Procedure Laterality Date   ATRIAL FIBRILLATION ABLATION N/A 07/15/2020   Procedure: ATRIAL FIBRILLATION ABLATION;  Surgeon: cindie Ole DASEN, MD;  Location: [facility];  Service: Cardiovascular;  Laterality: N/A;   ATRIAL FIBRILLATION ABLATION N/A 11/29/2023   Procedure: ATRIAL FIBRILLATION ABLATION;  Surgeon: cindie Ole DASEN, MD;  Location: [facility];  Service: Cardiovascular;  Laterality: N/A;   BREAST BIOPSY Right 07/17/2023   MM RT BREAST BX W LOC DEV 1ST LESION IMAGE BX SPEC STEREO GUIDE 07/17/2023 [facility]   CARDIAC CATHETERIZATION  06/03/2013   @[facility]  by dr bosie;  per pt was told normal coronary arteries   CARDIOVERSION N/A 05/11/2020   Procedure: CARDIOVERSION;  Surgeon: Darliss Rogue, MD;  Location: [facility] OR[facility];  Service: Cardiovascular;  Laterality: N/A;   CARPAL TUNNEL RELEASE Left 06/23/2020   Procedure: CARPAL TUNNEL RELEASE;  Surgeon: Murrell Kuba, MD;  Location: [facility];  Service: Orthopedics;  Laterality: Left;  IV REGIONAL FOREARM BLOCK   CARPAL TUNNEL RELEASE Right 2000   COLONOSCOPY  2020   HYSTEROSCOPY W/ ENDOMETRIAL ABLATION  02/23/2005   @[facility] ;  w/ novasure   HYSTEROSCOPY WITH D & C N/A 07/24/2022   Procedure: ATTEMPTED DILATATION AND CURETTAGE /HYSTEROSCOPY;  Surgeon: Cleotilde Ronal RAMAN, MD;  Location: [facility];  Service: Gynecology;  Laterality: N/A;   OPERATIVE ULTRASOUND N/A 07/24/2022   Procedure: OPERATIVE ULTRASOUND;  Surgeon:  Cleotilde Ronal RAMAN, MD;  Location: [facility];  Service: Gynecology;  Laterality: N/A;   REVISION AMPUTATION OF FINGER Left 12/17/2000   @[facility]  by dr sypher;  injury  left little finger   TUBAL LIGATION Bilateral    yrs ago   Patient Active Problem List   Diagnosis Date Noted   Atrial flutter (HCC) 02/27/2024   Atrial fibrillation (HCC) 11/29/2023   Flutter-fibrillation (HCC) 11/21/2023   Palpitations 11/21/2023   Hypercoagulable state due to paroxysmal atrial fibrillation (HCC) 12/22/2022   History of endometrial ablation 08/07/2022   Postmenopausal bleeding 08/07/2022   Cervical stenosis (uterine cervix) 05/29/2022   Vulvar  irritation 05/29/2022   Endometrial mass 05/29/2022   Ascending aorta dilatation 05/29/2022   Paraspinal mass 05/29/2022   Prediabetes 05/18/2022   Other fatigue 05/18/2022   Shortness of breath 05/18/2022   Pulmonary emphysema (HCC) 05/18/2022   Esophageal dysphagia 05/18/2022   Right conjunctivitis 04/23/2022   Hematuria 12/01/2021   Abnormal uterine bleeding (AUB) 12/01/2021   Former smoker 10/20/2021   Chronic bilateral low back pain without sciatica 05/15/2021   COVID-19 04/25/2021   Obesity (BMI 30-39.9) 03/17/2021   Bipolar depression (HCC) 03/17/2021   Preventative health care 03/17/2021   Vitamin D deficiency 03/17/2021   Skin lesion 03/17/2021   SVT (supraventricular tachycardia) 11/23/2020   Arthritis    Carpal tunnel syndrome of left wrist 02/04/2020   Chronic heart failure with preserved ejection fraction (HCC) 01/11/2020   Hepatic steatosis 12/18/2019   High serum thyroid  stimulating hormone (TSH) 12/18/2019   Lower extremity edema 12/17/2019   Neck swelling 12/17/2019   Paroxysmal atrial fibrillation (HCC) 12/17/2019   Lumbar radiculitis 01/16/2019   Tubular adenoma 08/22/2018   B12 deficiency 01/14/2018   Essential hypertension 05/19/2014   Gastroesophageal reflux disease without esophagitis 05/19/2014   Vasovagal syndrome 12/25/2013   Anxiety and depression 12/20/2013   Dysautonomia (HCC) 12/18/2013    PCP: [name], NP  REFERRING PROVIDER: [name], NP  REFERRING DIAG:  G89.29,M54.41 (ICD-10-CM) - Chronic bilateral low back pain with right-sided sciatica  Rationale for Evaluation and Treatment: Rehabilitation  THERAPY DIAG:  Other low back pain  Muscle weakness (generalized)  ONSET DATE: chronic  SUBJECTIVE:                                                                                                                                                                                           SUBJECTIVE STATEMENT: Patient reports  she is coming to therapy for her back pain and her MRI showed pinched nerve on the L side. Intermittent pain down her L leg and to R  knee. Patient reports she had early retirement due to not being able to complete her job duties (heavy lifting, walking, squatting, etc.)   PERTINENT HISTORY:  Per MD note on 04/09/24, She has a history of chronic back pain, described as a dull aching pain at the waistline, exacerbated by movement and persistent throughout the day. The pain occasionally radiates down her right leg to the knee and has caused numbness in the left leg in the past. She reports leg weakness, particularly when walking, and attributes some of this to her weight.   PAIN:  Are you having pain? Yes: NPRS scale: 1-2/10; worst 10/10  Pain location: low back  Pain description: dull, ache, constant Aggravating factors: walking, lifting, standing >10-15 minutes  Relieving factors: sitting  PRECAUTIONS: None  RED FLAGS: None   WEIGHT BEARING RESTRICTIONS: No  FALLS:  Has patient fallen in last 6 months? No  LIVING ENVIRONMENT: Lives with: lives with their spouse Lives in: House/apartment Stairs: Yes: External: 4 steps; can reach both Has following equipment at home: None  OCCUPATION: retired   PLOF: Independent  PATIENT GOALS: to relieve some of the pain and be more mobile   OBJECTIVE:  Note: Objective measures were completed at Evaluation unless otherwise noted.  DIAGNOSTIC FINDINGS:  MRI SPINE LUMBAR WO CONTRAST Collected on Feb 26, 2024 9:45 AM  IMPRESSION: Mild degenerative changes of the lumbar spine.  PATIENT SURVEYS:  Modified Oswestry:  MODIFIED OSWESTRY DISABILITY SCALE  Date: 04/29/24 Score  Pain intensity 1 = The pain is bad, but I can manage without having to take (1) I can stand as long as I want but, it increases my pain. pain medication.  2. Personal care (washing, dressing, etc.) 0 =  I can take care of myself normally without causing increased pain.  3.  Lifting 2 = Pain prevents me from lifting heavy weights off the floor, activities (eg. sports, dancing). but I can manage if the weights are conveniently positioned (3) Pain prevents me from going out very often. (eg, on a table).  4. Walking 3 =  Pain prevents me from walking more than  mile.  5. Sitting 0 =  I can sit in any chair as long as I like.  6. Standing 4 =  Pain prevents me from standing more than 10 minutes.  7. Sleeping 5 =  Pain prevents me from sleeping at all.  8. Social Life 1 =  My social life is normal, but it increases my level of pain.  9. Traveling 0 =  I can travel anywhere without increased pain.  10. Employment/ Homemaking 5 = Pain prevents me from performing any job or homemaking chores.  Total 21/50 = 42%   Interpretation of scores: Score Category Description  0-20% Minimal Disability The patient can cope with most living activities. Usually no treatment is indicated apart from advice on lifting, sitting and exercise  21-40% Moderate Disability The patient experiences more pain and difficulty with sitting, lifting and standing. Travel and social life are more difficult and they may be disabled from work. Personal care, sexual activity and sleeping are not grossly affected, and the patient can usually be managed by conservative means  41-60% Severe Disability Pain remains the main problem in this group, but activities of daily living are affected. These patients require a detailed investigation  61-80% Crippled Back pain impinges on all aspects of the patient's life. Positive intervention is required  81-100% Bed-bound  These patients are either bed-bound or exaggerating  their symptoms  Bluford FORBES Zoe DELENA Karon DELENA, et al. Surgery versus conservative management of stable thoracolumbar fracture: the PRESTO feasibility RCT. [place] ([place]): [employer]; 2021 Nov. [facility] Technology Assessment, No. 25.62.) Appendix 3, Oswestry Disability Index category  descriptors. Available from: FindJewelers.cz  Minimally Clinically Important Difference (MCID) = 12.8%  COGNITION: Overall cognitive status: Within functional limits for tasks assessed     SENSATION: WFL  MUSCLE LENGTH: Hamstrings: Right 40 deg; Left 45 deg  POSTURE: rounded shoulders and forward head  PALPATION: Tightness noted to lumbar paraspinals (L>R), piriformis bilaterally TTP over spinous process of lumbar spine during CPAs  LUMBAR ROM:   AROM eval  Flexion Fingertips to mid shin   Extension 25%   Right lateral flexion WFL  Left lateral flexion WFL*  Right rotation 50%  Left rotation 50%*   (Blank rows = not tested)  LOWER EXTREMITY ROM:     WFL  LOWER EXTREMITY MMT:    MMT Right eval Left eval  Hip flexion 4+ 4+  Hip extension    Hip abduction 4+ 4+  Hip adduction 4+ 4+  Hip internal rotation    Hip external rotation    Knee flexion 4+ 4+  Knee extension 4+ 4+  Ankle dorsiflexion 4 4  Ankle plantarflexion    Ankle inversion    Ankle eversion     (Blank rows = not tested)  LUMBAR SPECIAL TESTS:  Straight leg raise test: Negative and FABER test: Negative  FUNCTIONAL TESTS:  5 times sit to stand: 21.85 seconds 6 minute walk test: to be completed visit #2  10 meter walk test: to be completed visit #2   GAIT: Distance walked: 88' Assistive device utilized: None Level of assistance: Complete Independence Comments: guarded gait, no other obvious gait deficits   TREATMENT DATE: 05/04/24                                                                                                                               Subjective: Patient reports no pain arrival. Did not complete her HEP as instructed but did do them one day.   : 1242' with no AD   Therapeutic Exercise:   Supine therapist assisted hamstring stretch   2 x 30 seconds each LE   Supine therapist assisted SKTC   2 x 30 seconds each LE   Supine  piriformis therapist assisted stretch  2 x 30 seconds each LE  Lower trunk rotation x 10  Hooklying marching 2 x 10  Hooklying hip abduction with RTB around knees 2 x 10  Forward trunk flexion with physioball x 10, to L x 10, to R x 10   PATIENT EDUCATION:  Education details: HEP, POC, goals  Person educated: Patient Education method: Explanation, Demonstration, and Handouts Education comprehension: verbalized understanding and returned demonstration  HOME EXERCISE PROGRAM: Access Code: [email or web address] URL: [email or web address] Date: 04/29/2024 Prepared by: Maryanne Finder  Exercises - Seated Piriformis Stretch  -  2-3 x daily - 5-7 x weekly - 3-5 reps - 30-60 seconds hold - Seated Hamstring Stretch  - 2-3 x daily - 5-7 x weekly - 3-5 reps - 30-60 seconds hold - Supine Lower Trunk Rotation  - 2-3 x daily - 5-7 x weekly - 3 sets - 10 reps  ASSESSMENT:  CLINICAL IMPRESSION: Patient seen for initial treatment for low back pain after evaluation. Session focused on low back/LE stretching and core activation training. Tolerated session well with no increase in pain. Patient will benefit from skilled PT services to address listed impairments to improve quality of life and decrease pain.    OBJECTIVE IMPAIRMENTS: Abnormal gait, decreased activity tolerance, decreased endurance, decreased mobility, difficulty walking, decreased strength, impaired flexibility, postural dysfunction, and pain.   ACTIVITY LIMITATIONS: lifting, bending, standing, squatting, stairs, and locomotion level  PARTICIPATION LIMITATIONS: cleaning, shopping, community activity, and occupation  PERSONAL FACTORS: Age, Behavior pattern, Past/current experiences, and Time since onset of injury/illness/exacerbation are also affecting patient's functional outcome.   REHAB POTENTIAL: Good  CLINICAL DECISION MAKING: Stable/uncomplicated  EVALUATION COMPLEXITY: Moderate   GOALS: Goals reviewed with patient?  Yes  SHORT TERM GOALS: Target date: 05/27/2024  Patient will be independent in HEP to improve strength/mobility for better functional independence with ADLs.  Baseline: Goal status: INITIAL   LONG TERM GOALS: Target date: 06/24/2024  Patient will reduce modified Oswestry score to <20 as to demonstrate minimal disability with ADLs including improved sleeping tolerance, walking/sitting tolerance etc for better mobility with ADLs. Baseline:  Goal status: INITIAL  2.  Patient will report a worst pain of 3/10 on NRPS in low back to improve tolerance with ADLs and reduced symptoms with activities.  Baseline: 04/29/24: 10/10 Goal status: INITIAL  3.  Patient (< 12 years old) will complete five times sit to stand test in < 10 seconds indicating an increased LE strength and improved balance.  Baseline:  Goal status: INITIAL  4.  Patient will improve by 78m (164') in order to demonstrate clinically significant improvement in cardiopulmonary endurance and community ambulation  Baseline:  Goal status: INITIAL  5.  Patient will tolerate standing >30 minutes to demonstrate improved tolerance to activity to be able to complete iADLs at home.  Baseline: 04/29/24: unable to tolerate >10-15 minutes  Goal status: INITIAL   PLAN:  PT FREQUENCY: 1-2x/week  PT DURATION: 8 weeks  PLANNED INTERVENTIONS: [id number]- PT Re-evaluation, 97750- Physical Performance Testing, 97110-Therapeutic exercises, 97530- Therapeutic activity, 97112- Neuromuscular re-education, 97535- Self Care, [id number]- Manual therapy, [phone]- Gait training, [phone]- Electrical stimulation (unattended), [phone]- Electrical stimulation (manual), Patient/Family education, Balance training, Stair training, Joint mobilization, Joint manipulation, Spinal manipulation, Spinal mobilization, Cryotherapy, and Moist heat.  PLAN FOR NEXT SESSION: HEP review, core strengthening, stretching    Maryanne Finder, PT, DPT Physical Therapist - [place]   [facility] 05/04/2024, 8:15 AM

## 2024-05-04 ENCOUNTER — Ambulatory Visit

## 2024-05-04 DIAGNOSIS — M6281 Muscle weakness (generalized): Secondary | ICD-10-CM

## 2024-05-04 DIAGNOSIS — M5459 Other low back pain: Secondary | ICD-10-CM

## 2024-05-05 ENCOUNTER — Encounter

## 2024-05-05 NOTE — Therapy (Signed)
 OUTPATIENT PHYSICAL THERAPY THORACOLUMBAR TREATMENT   Patient Name: Victoria Williamson MRN: [id number] DOB:1966-06-03, 58 y.o., female Today's Date: 05/05/2024  END OF SESSION:     Past Medical History:  Diagnosis Date   Anticoagulant long-term use    eliquis --- managed by cardiology   Arthritis    Ascending aortic aneurysm 05/2022   had surgical consult 05-21-2022 w/ wayne gold PA ,   4.0 cm per CT 05-21-2022 in epic, active survillance   Chronic low back pain    DDD (degenerative disc disease), lumbar    Diastolic CHF, chronic (HCC)    followed by cardiology;    last echo 07-27-2021 ef 60-65%   DOE (dyspnea on exertion)    Dysautonomia (HCC) 12/18/2013   dx by cardiologist--- dr fernande   [phone]  pt stated no issues in passed few yrs)   Edema of both lower extremities    intermittant per pt   Fatty liver    GAD (generalized anxiety disorder)    History of adenomatous polyp of colon    Hx of skin cancer, basal cell    Hypertension    MDD (major depressive disorder)    PAF (paroxysmal atrial fibrillation) (HCC) 09/2019   cardiologist-  dr b. agbor-etang/  EP cardiologist-- dr cindie   PMB (postmenopausal bleeding)    PONV (postoperative nausea and vomiting)    Pre-diabetes    PSVT (paroxysmal supraventricular tachycardia)    Pulmonary emphysema (HCC)    followed by pcp   Pulmonary nodule    followed by pcp   Spondylolisthesis of lumbar region    Past Surgical History:  Procedure Laterality Date   ATRIAL FIBRILLATION ABLATION N/A 07/15/2020   Procedure: ATRIAL FIBRILLATION ABLATION;  Surgeon: cindie Ole DASEN, MD;  Location: [facility];  Service: Cardiovascular;  Laterality: N/A;   ATRIAL FIBRILLATION ABLATION N/A 11/29/2023   Procedure: ATRIAL FIBRILLATION ABLATION;  Surgeon: cindie Ole DASEN, MD;  Location: [facility];  Service: Cardiovascular;  Laterality: N/A;   BREAST BIOPSY Right 07/17/2023   MM RT BREAST BX W LOC DEV 1ST LESION IMAGE  BX SPEC STEREO GUIDE 07/17/2023 [facility]   CARDIAC CATHETERIZATION  06/03/2013   @[facility]  by dr bosie;  per pt was told normal coronary arteries   CARDIOVERSION N/A 05/11/2020   Procedure: CARDIOVERSION;  Surgeon: Darliss Rogue, MD;  Location: [facility];  Service: Cardiovascular;  Laterality: N/A;   CARPAL TUNNEL RELEASE Left 06/23/2020   Procedure: CARPAL TUNNEL RELEASE;  Surgeon: Murrell Kuba, MD;  Location: [facility];  Service: Orthopedics;  Laterality: Left;  IV REGIONAL FOREARM BLOCK   CARPAL TUNNEL RELEASE Right 2000   COLONOSCOPY  2020   HYSTEROSCOPY W/ ENDOMETRIAL ABLATION  02/23/2005   @[facility] ;  w/ novasure   HYSTEROSCOPY WITH D & C N/A 07/24/2022   Procedure: ATTEMPTED DILATATION AND CURETTAGE /HYSTEROSCOPY;  Surgeon: Cleotilde Ronal RAMAN, MD;  Location: [facility];  Service: Gynecology;  Laterality: N/A;   OPERATIVE ULTRASOUND N/A 07/24/2022   Procedure: OPERATIVE ULTRASOUND;  Surgeon: Cleotilde Ronal RAMAN, MD;  Location: [facility];  Service: Gynecology;  Laterality: N/A;   REVISION AMPUTATION OF FINGER Left 12/17/2000   @[facility]  by dr sypher;  injury  left little finger   TUBAL LIGATION Bilateral    yrs ago   Patient Active Problem List   Diagnosis Date Noted   Atrial flutter (HCC) 02/27/2024   Atrial fibrillation (HCC) 11/29/2023   Flutter-fibrillation (HCC) 11/21/2023   Palpitations 11/21/2023  Hypercoagulable state due to paroxysmal atrial fibrillation (HCC) 12/22/2022   History of endometrial ablation 08/07/2022   Postmenopausal bleeding 08/07/2022   Cervical stenosis (uterine cervix) 05/29/2022   Vulvar irritation 05/29/2022   Endometrial mass 05/29/2022   Ascending aorta dilatation 05/29/2022   Paraspinal mass 05/29/2022   Prediabetes 05/18/2022   Other fatigue 05/18/2022   Shortness of breath 05/18/2022   Pulmonary emphysema (HCC) 05/18/2022   Esophageal dysphagia 05/18/2022   Right conjunctivitis 04/23/2022   Hematuria  12/01/2021   Abnormal uterine bleeding (AUB) 12/01/2021   Former smoker 10/20/2021   Chronic bilateral low back pain without sciatica 05/15/2021   COVID-19 04/25/2021   Obesity (BMI 30-39.9) 03/17/2021   Bipolar depression (HCC) 03/17/2021   Preventative health care 03/17/2021   Vitamin D deficiency 03/17/2021   Skin lesion 03/17/2021   SVT (supraventricular tachycardia) 11/23/2020   Arthritis    Carpal tunnel syndrome of left wrist 02/04/2020   Chronic heart failure with preserved ejection fraction (HCC) 01/11/2020   Hepatic steatosis 12/18/2019   High serum thyroid  stimulating hormone (TSH) 12/18/2019   Lower extremity edema 12/17/2019   Neck swelling 12/17/2019   Paroxysmal atrial fibrillation (HCC) 12/17/2019   Lumbar radiculitis 01/16/2019   Tubular adenoma 08/22/2018   B12 deficiency 01/14/2018   Essential hypertension 05/19/2014   Gastroesophageal reflux disease without esophagitis 05/19/2014   Vasovagal syndrome 12/25/2013   Anxiety and depression 12/20/2013   Dysautonomia (HCC) 12/18/2013    PCP: [name], NP  REFERRING PROVIDER: [name], NP  REFERRING DIAG:  G89.29,M54.41 (ICD-10-CM) - Chronic bilateral low back pain with right-sided sciatica  Rationale for Evaluation and Treatment: Rehabilitation  THERAPY DIAG:  Other low back pain  Muscle weakness (generalized)  ONSET DATE: chronic  SUBJECTIVE:                                                                                                                                                                                           SUBJECTIVE STATEMENT: Patient reports she is coming to therapy for her back pain and her MRI showed pinched nerve on the L side. Intermittent pain down her L leg and to R knee. Patient reports she had early retirement due to not being able to complete her job duties (heavy lifting, walking, squatting, etc.)   PERTINENT HISTORY:  Per MD note on 04/09/24, She has a  history of chronic back pain, described as a dull aching pain at the waistline, exacerbated by movement and persistent throughout the day. The pain occasionally radiates down her right leg to the knee and has caused numbness in the left leg in the past. She  reports leg weakness, particularly when walking, and attributes some of this to her weight.   PAIN:  Are you having pain? Yes: NPRS scale: 1-2/10; worst 10/10  Pain location: low back  Pain description: dull, ache, constant Aggravating factors: walking, lifting, standing >10-15 minutes  Relieving factors: sitting  PRECAUTIONS: None  RED FLAGS: None   WEIGHT BEARING RESTRICTIONS: No  FALLS:  Has patient fallen in last 6 months? No  LIVING ENVIRONMENT: Lives with: lives with their spouse Lives in: House/apartment Stairs: Yes: External: 4 steps; can reach both Has following equipment at home: None  OCCUPATION: retired   PLOF: Independent  PATIENT GOALS: to relieve some of the pain and be more mobile   OBJECTIVE:  Note: Objective measures were completed at Evaluation unless otherwise noted.  DIAGNOSTIC FINDINGS:  MRI SPINE LUMBAR WO CONTRAST Collected on Feb 26, 2024 9:45 AM  IMPRESSION: Mild degenerative changes of the lumbar spine.  PATIENT SURVEYS:  Modified Oswestry:  MODIFIED OSWESTRY DISABILITY SCALE  Date: 04/29/24 Score  Pain intensity 1 = The pain is bad, but I can manage without having to take (1) I can stand as long as I want but, it increases my pain. pain medication.  2. Personal care (washing, dressing, etc.) 0 =  I can take care of myself normally without causing increased pain.  3. Lifting 2 = Pain prevents me from lifting heavy weights off the floor, activities (eg. sports, dancing). but I can manage if the weights are conveniently positioned (3) Pain prevents me from going out very often. (eg, on a table).  4. Walking 3 =  Pain prevents me from walking more than  mile.  5. Sitting 0 =  I can sit in  any chair as long as I like.  6. Standing 4 =  Pain prevents me from standing more than 10 minutes.  7. Sleeping 5 =  Pain prevents me from sleeping at all.  8. Social Life 1 =  My social life is normal, but it increases my level of pain.  9. Traveling 0 =  I can travel anywhere without increased pain.  10. Employment/ Homemaking 5 = Pain prevents me from performing any job or homemaking chores.  Total 21/50 = 42%   Interpretation of scores: Score Category Description  0-20% Minimal Disability The patient can cope with most living activities. Usually no treatment is indicated apart from advice on lifting, sitting and exercise  21-40% Moderate Disability The patient experiences more pain and difficulty with sitting, lifting and standing. Travel and social life are more difficult and they may be disabled from work. Personal care, sexual activity and sleeping are not grossly affected, and the patient can usually be managed by conservative means  41-60% Severe Disability Pain remains the main problem in this group, but activities of daily living are affected. These patients require a detailed investigation  61-80% Crippled Back pain impinges on all aspects of the patient's life. Positive intervention is required  81-100% Bed-bound  These patients are either bed-bound or exaggerating their symptoms  Bluford FORBES Zoe DELENA Karon DELENA, et al. Surgery versus conservative management of stable thoracolumbar fracture: the PRESTO feasibility RCT. [place] ([place]): [employer]; 2021 Nov. [place] [facility] Technology Assessment, No. 25.62.) Appendix 3, Oswestry Disability Index category descriptors. Available from: FindJewelers.cz  Minimally Clinically Important Difference (MCID) = 12.8%  COGNITION: Overall cognitive status: Within functional limits for tasks assessed     SENSATION: WFL  MUSCLE LENGTH: Hamstrings: Right 40 deg; Left 45 deg  POSTURE: rounded shoulders and  forward head  PALPATION: Tightness noted to lumbar paraspinals (L>R), piriformis bilaterally TTP over spinous process of lumbar spine during CPAs  LUMBAR ROM:   AROM eval  Flexion Fingertips to mid shin   Extension 25%   Right lateral flexion WFL  Left lateral flexion WFL*  Right rotation 50%  Left rotation 50%*   (Blank rows = not tested)  LOWER EXTREMITY ROM:     WFL  LOWER EXTREMITY MMT:    MMT Right eval Left eval  Hip flexion 4+ 4+  Hip extension    Hip abduction 4+ 4+  Hip adduction 4+ 4+  Hip internal rotation    Hip external rotation    Knee flexion 4+ 4+  Knee extension 4+ 4+  Ankle dorsiflexion 4 4  Ankle plantarflexion    Ankle inversion    Ankle eversion     (Blank rows = not tested)  LUMBAR SPECIAL TESTS:  Straight leg raise test: Negative and FABER test: Negative  FUNCTIONAL TESTS:  5 times sit to stand: 21.85 seconds 6 minute walk test: to be completed visit #2  10 meter walk test: to be completed visit #2   GAIT: Distance walked: 74' Assistive device utilized: None Level of assistance: Complete Independence Comments: guarded gait, no other obvious gait deficits   TREATMENT DATE: 05/05/24                                                                                                                              ***  Subjective: Patient reports no pain arrival. Did not complete her HEP as instructed but did do them one day.    Therapeutic Exercise:   Supine therapist assisted hamstring stretch   2 x 30 seconds each LE   Supine therapist assisted SKTC   2 x 30 seconds each LE   Supine piriformis therapist assisted stretch  2 x 30 seconds each LE  Lower trunk rotation x 10  Hooklying marching 2 x 10  Hooklying hip abduction with RTB around knees 2 x 10  Forward trunk flexion with physioball x 10, to L x 10, to R x 10   PATIENT EDUCATION:  Education details: HEP, POC, goals  Person educated: Patient Education method:  Explanation, Demonstration, and Handouts Education comprehension: verbalized understanding and returned demonstration  HOME EXERCISE PROGRAM: Access Code: [email or web address] URL: [email or web address] Date: 04/29/2024 Prepared by: Maryanne Finder  Exercises - Seated Piriformis Stretch  - 2-3 x daily - 5-7 x weekly - 3-5 reps - 30-60 seconds hold - Seated Hamstring Stretch  - 2-3 x daily - 5-7 x weekly - 3-5 reps - 30-60 seconds hold - Supine Lower Trunk Rotation  - 2-3 x daily - 5-7 x weekly - 3 sets - 10 reps  ASSESSMENT:  CLINICAL IMPRESSION: ***  Patient seen for initial treatment for low back pain after evaluation. Session focused on low back/LE stretching and core activation training. Tolerated session well  with no increase in pain. Patient will benefit from skilled PT services to address listed impairments to improve quality of life and decrease pain.    OBJECTIVE IMPAIRMENTS: Abnormal gait, decreased activity tolerance, decreased endurance, decreased mobility, difficulty walking, decreased strength, impaired flexibility, postural dysfunction, and pain.   ACTIVITY LIMITATIONS: lifting, bending, standing, squatting, stairs, and locomotion level  PARTICIPATION LIMITATIONS: cleaning, shopping, community activity, and occupation  PERSONAL FACTORS: Age, Behavior pattern, Past/current experiences, and Time since onset of injury/illness/exacerbation are also affecting patient's functional outcome.   REHAB POTENTIAL: Good  CLINICAL DECISION MAKING: Stable/uncomplicated  EVALUATION COMPLEXITY: Moderate   GOALS: Goals reviewed with patient? Yes  SHORT TERM GOALS: Target date: 05/27/2024  Patient will be independent in HEP to improve strength/mobility for better functional independence with ADLs.  Baseline: Goal status: INITIAL   LONG TERM GOALS: Target date: 06/24/2024  Patient will reduce modified Oswestry score to <20 as to demonstrate minimal disability with ADLs  including improved sleeping tolerance, walking/sitting tolerance etc for better mobility with ADLs. Baseline:  Goal status: INITIAL  2.  Patient will report a worst pain of 3/10 on NRPS in low back to improve tolerance with ADLs and reduced symptoms with activities.  Baseline: 04/29/24: 10/10 Goal status: INITIAL  3.  Patient (< 43 years old) will complete five times sit to stand test in < 10 seconds indicating an increased LE strength and improved balance.  Baseline:  Goal status: INITIAL  4.  Patient will improve by 6m (164') in order to demonstrate clinically significant improvement in cardiopulmonary endurance and community ambulation  Baseline:  Goal status: INITIAL  5.  Patient will tolerate standing >30 minutes to demonstrate improved tolerance to activity to be able to complete iADLs at home.  Baseline: 04/29/24: unable to tolerate >10-15 minutes  Goal status: INITIAL   PLAN:  PT FREQUENCY: 1-2x/week  PT DURATION: 8 weeks  PLANNED INTERVENTIONS: [id number]- PT Re-evaluation, 97750- Physical Performance Testing, 97110-Therapeutic exercises, 97530- Therapeutic activity, 97112- Neuromuscular re-education, 97535- Self Care, [id number]- Manual therapy, [phone]- Gait training, [phone]- Electrical stimulation (unattended), [phone]- Electrical stimulation (manual), Patient/Family education, Balance training, Stair training, Joint mobilization, Joint manipulation, Spinal manipulation, Spinal mobilization, Cryotherapy, and Moist heat.  PLAN FOR NEXT SESSION: HEP review, core strengthening, stretching    Maryanne Finder, PT, DPT Physical Therapist - [place]  [facility] 05/05/2024, 8:54 AM

## 2024-05-06 ENCOUNTER — Encounter: Payer: Self-pay | Admitting: Nurse Practitioner

## 2024-05-06 ENCOUNTER — Ambulatory Visit: Attending: Nurse Practitioner

## 2024-05-06 DIAGNOSIS — M5459 Other low back pain: Secondary | ICD-10-CM | POA: Insufficient documentation

## 2024-05-06 DIAGNOSIS — M6281 Muscle weakness (generalized): Secondary | ICD-10-CM | POA: Diagnosis not present

## 2024-05-11 NOTE — Therapy (Signed)
 OUTPATIENT PHYSICAL THERAPY THORACOLUMBAR TREATMENT   Patient Name: Reigan Tolliver MRN: [id number] DOB:[birth year], 58 y.o., female Today's Date: 05/12/2024  END OF SESSION:  PT End of Session - 05/12/24 1106     Visit Number 4    Number of Visits 17    Date for Recertification  06/24/24    PT Start Time 1115    PT Stop Time 1157    PT Time Calculation (min) 42 min    Activity Tolerance Patient tolerated treatment well    Behavior During Therapy [facility] for tasks assessed/performed             Past Medical History:  Diagnosis Date   Anticoagulant long-term use    eliquis --- managed by cardiology   Arthritis    Ascending aortic aneurysm 05/2022   had surgical consult 05-21-2022 w/ wayne gold PA ,   4.0 cm per CT 05-21-2022 in epic, active survillance   Chronic low back pain    DDD (degenerative disc disease), lumbar    Diastolic CHF, chronic (HCC)    followed by cardiology;    last echo 07-27-2021 ef 60-65%   DOE (dyspnea on exertion)    Dysautonomia (HCC) 12/18/2013   dx by cardiologist--- dr fernande   [phone]  pt stated no issues in passed few yrs)   Edema of both lower extremities    intermittant per pt   Fatty liver    GAD (generalized anxiety disorder)    History of adenomatous polyp of colon    Hx of skin cancer, basal cell    Hypertension    MDD (major depressive disorder)    PAF (paroxysmal atrial fibrillation) (HCC) 09/2019   cardiologist-  dr b. agbor-etang/  EP cardiologist-- dr cindie   PMB (postmenopausal bleeding)    PONV (postoperative nausea and vomiting)    Pre-diabetes    PSVT (paroxysmal supraventricular tachycardia)    Pulmonary emphysema (HCC)    followed by pcp   Pulmonary nodule    followed by pcp   Spondylolisthesis of lumbar region    Past Surgical History:  Procedure Laterality Date   ATRIAL FIBRILLATION ABLATION N/A 07/15/2020   Procedure: ATRIAL FIBRILLATION ABLATION;  Surgeon: cindie Ole DASEN, MD;  Location: [facility];  Service: Cardiovascular;  Laterality: N/A;   ATRIAL FIBRILLATION ABLATION N/A 11/29/2023   Procedure: ATRIAL FIBRILLATION ABLATION;  Surgeon: cindie Ole DASEN, MD;  Location: [facility];  Service: Cardiovascular;  Laterality: N/A;   BREAST BIOPSY Right 07/17/2023   MM RT BREAST BX W LOC DEV 1ST LESION IMAGE BX SPEC STEREO GUIDE 07/17/2023 [facility]   CARDIAC CATHETERIZATION  06/03/2013   @[facility]  by dr bosie;  per pt was told normal coronary arteries   CARDIOVERSION N/A 05/11/2020   Procedure: CARDIOVERSION;  Surgeon: Darliss Rogue, MD;  Location: [facility];  Service: Cardiovascular;  Laterality: N/A;   CARPAL TUNNEL RELEASE Left 06/23/2020   Procedure: CARPAL TUNNEL RELEASE;  Surgeon: Murrell Kuba, MD;  Location: [facility];  Service: Orthopedics;  Laterality: Left;  IV REGIONAL FOREARM BLOCK   CARPAL TUNNEL RELEASE Right 2000   COLONOSCOPY  2020   HYSTEROSCOPY W/ ENDOMETRIAL ABLATION  02/23/2005   @[facility] ;  w/ novasure   HYSTEROSCOPY WITH D & C N/A 07/24/2022   Procedure: ATTEMPTED DILATATION AND CURETTAGE /HYSTEROSCOPY;  Surgeon: Cleotilde Ronal RAMAN, MD;  Location: [facility];  Service: Gynecology;  Laterality: N/A;   OPERATIVE ULTRASOUND N/A 07/24/2022   Procedure: OPERATIVE ULTRASOUND;  Surgeon: Cleotilde Ronal RAMAN, MD;  Location: [facility];  Service: Gynecology;  Laterality: N/A;   REVISION AMPUTATION OF FINGER Left 12/17/2000   @[facility]  by dr sypher;  injury  left little finger   TUBAL LIGATION Bilateral    yrs ago   Patient Active Problem List   Diagnosis Date Noted   Atrial flutter (HCC) 02/27/2024   Atrial fibrillation (HCC) 11/29/2023   Flutter-fibrillation (HCC) 11/21/2023   Palpitations 11/21/2023   Hypercoagulable state due to paroxysmal atrial fibrillation (HCC) 12/22/2022   History of endometrial ablation 08/07/2022   Postmenopausal bleeding 08/07/2022   Cervical stenosis (uterine cervix) 05/29/2022   Vulvar  irritation 05/29/2022   Endometrial mass 05/29/2022   Ascending aorta dilatation 05/29/2022   Paraspinal mass 05/29/2022   Prediabetes 05/18/2022   Other fatigue 05/18/2022   Shortness of breath 05/18/2022   Pulmonary emphysema (HCC) 05/18/2022   Esophageal dysphagia 05/18/2022   Right conjunctivitis 04/23/2022   Hematuria 12/01/2021   Abnormal uterine bleeding (AUB) 12/01/2021   Former smoker 10/20/2021   Chronic bilateral low back pain without sciatica 05/15/2021   COVID-19 04/25/2021   Obesity (BMI 30-39.9) 03/17/2021   Bipolar depression (HCC) 03/17/2021   Preventative health care 03/17/2021   Vitamin D deficiency 03/17/2021   Skin lesion 03/17/2021   SVT (supraventricular tachycardia) 11/23/2020   Arthritis    Carpal tunnel syndrome of left wrist 02/04/2020   Chronic heart failure with preserved ejection fraction (HCC) 01/11/2020   Hepatic steatosis 12/18/2019   High serum thyroid  stimulating hormone (TSH) 12/18/2019   Lower extremity edema 12/17/2019   Neck swelling 12/17/2019   Paroxysmal atrial fibrillation (HCC) 12/17/2019   Lumbar radiculitis 01/16/2019   Tubular adenoma 08/22/2018   B12 deficiency 01/14/2018   Essential hypertension 05/19/2014   Gastroesophageal reflux disease without esophagitis 05/19/2014   Vasovagal syndrome 12/25/2013   Anxiety and depression 12/20/2013   Dysautonomia (HCC) 12/18/2013    PCP: [name], NP  REFERRING PROVIDER: [name], NP  REFERRING DIAG:  G89.29,M54.41 (ICD-10-CM) - Chronic bilateral low back pain with right-sided sciatica  Rationale for Evaluation and Treatment: Rehabilitation  THERAPY DIAG:  Other low back pain  Muscle weakness (generalized)  ONSET DATE: chronic  SUBJECTIVE:                                                                                                                                                                                           SUBJECTIVE STATEMENT: Patient reports  she is coming to therapy for her back pain and her MRI showed pinched nerve on the L side. Intermittent pain down her L leg and to  R knee. Patient reports she had early retirement due to not being able to complete her job duties (heavy lifting, walking, squatting, etc.)   PERTINENT HISTORY:  Per MD note on 04/09/24, She has a history of chronic back pain, described as a dull aching pain at the waistline, exacerbated by movement and persistent throughout the day. The pain occasionally radiates down her right leg to the knee and has caused numbness in the left leg in the past. She reports leg weakness, particularly when walking, and attributes some of this to her weight.   PAIN:  Are you having pain? Yes: NPRS scale: 1-2/10; worst 10/10  Pain location: low back  Pain description: dull, ache, constant Aggravating factors: walking, lifting, standing >10-15 minutes  Relieving factors: sitting  PRECAUTIONS: None  RED FLAGS: None   WEIGHT BEARING RESTRICTIONS: No  FALLS:  Has patient fallen in last 6 months? No  LIVING ENVIRONMENT: Lives with: lives with their spouse Lives in: House/apartment Stairs: Yes: External: 4 steps; can reach both Has following equipment at home: None  OCCUPATION: retired   PLOF: Independent  PATIENT GOALS: to relieve some of the pain and be more mobile   OBJECTIVE:  Note: Objective measures were completed at Evaluation unless otherwise noted.  DIAGNOSTIC FINDINGS:  MRI SPINE LUMBAR WO CONTRAST Collected on Feb 26, 2024 9:45 AM  IMPRESSION: Mild degenerative changes of the lumbar spine.  PATIENT SURVEYS:  Modified Oswestry:  MODIFIED OSWESTRY DISABILITY SCALE  Date: 04/29/24 Score  Pain intensity 1 = The pain is bad, but I can manage without having to take (1) I can stand as long as I want but, it increases my pain. pain medication.  2. Personal care (washing, dressing, etc.) 0 =  I can take care of myself normally without causing increased pain.  3.  Lifting 2 = Pain prevents me from lifting heavy weights off the floor, activities (eg. sports, dancing). but I can manage if the weights are conveniently positioned (3) Pain prevents me from going out very often. (eg, on a table).  4. Walking 3 =  Pain prevents me from walking more than  mile.  5. Sitting 0 =  I can sit in any chair as long as I like.  6. Standing 4 =  Pain prevents me from standing more than 10 minutes.  7. Sleeping 5 =  Pain prevents me from sleeping at all.  8. Social Life 1 =  My social life is normal, but it increases my level of pain.  9. Traveling 0 =  I can travel anywhere without increased pain.  10. Employment/ Homemaking 5 = Pain prevents me from performing any job or homemaking chores.  Total 21/50 = 42%   Interpretation of scores: Score Category Description  0-20% Minimal Disability The patient can cope with most living activities. Usually no treatment is indicated apart from advice on lifting, sitting and exercise  21-40% Moderate Disability The patient experiences more pain and difficulty with sitting, lifting and standing. Travel and social life are more difficult and they may be disabled from work. Personal care, sexual activity and sleeping are not grossly affected, and the patient can usually be managed by conservative means  41-60% Severe Disability Pain remains the main problem in this group, but activities of daily living are affected. These patients require a detailed investigation  61-80% Crippled Back pain impinges on all aspects of the patient's life. Positive intervention is required  81-100% Bed-bound  These patients are either bed-bound or  exaggerating their symptoms  Bluford FORBES Zoe DELENA Karon DELENA, et al. Surgery versus conservative management of stable thoracolumbar fracture: the PRESTO feasibility RCT. [place] ([place]): [employer]; 2021 Nov. [facility] Technology Assessment, No. 25.62.) Appendix 3, Oswestry Disability Index category  descriptors. Available from: FindJewelers.cz  Minimally Clinically Important Difference (MCID) = 12.8%  COGNITION: Overall cognitive status: Within functional limits for tasks assessed     SENSATION: WFL  MUSCLE LENGTH: Hamstrings: Right 40 deg; Left 45 deg  POSTURE: rounded shoulders and forward head  PALPATION: Tightness noted to lumbar paraspinals (L>R), piriformis bilaterally TTP over spinous process of lumbar spine during CPAs  LUMBAR ROM:   AROM eval  Flexion Fingertips to mid shin   Extension 25%   Right lateral flexion WFL  Left lateral flexion WFL*  Right rotation 50%  Left rotation 50%*   (Blank rows = not tested)  LOWER EXTREMITY ROM:     WFL  LOWER EXTREMITY MMT:    MMT Right eval Left eval  Hip flexion 4+ 4+  Hip extension    Hip abduction 4+ 4+  Hip adduction 4+ 4+  Hip internal rotation    Hip external rotation    Knee flexion 4+ 4+  Knee extension 4+ 4+  Ankle dorsiflexion 4 4  Ankle plantarflexion    Ankle inversion    Ankle eversion     (Blank rows = not tested)  LUMBAR SPECIAL TESTS:  Straight leg raise test: Negative and FABER test: Negative  FUNCTIONAL TESTS:  5 times sit to stand: 21.85 seconds 6 minute walk test: to be completed visit #2  10 meter walk test: to be completed visit #2   GAIT: Distance walked: 28' Assistive device utilized: None Level of assistance: Complete Independence Comments: guarded gait, no other obvious gait deficits   TREATMENT DATE: 05/12/24                                                                                                                                 Subjective: Patient reports 2-3/10 pain in her low back. Mild soreness after last session.    Therapeutic Exercise:  Nustep level 5-3 x 5 minutes (seat 9) to improve cardiorespiratory endurance and improve trunk mobility  Forward trunk flexion with physioball 2 x 10, to L 2 x 10, to R 2 x 10 (one set at  beginning and one at end of session)  Seated twists with 2kg med ball 2 x 10 each side   Seated marching with BTB around knees 3 x 10   Standing B shoulder extension with BTB 3 x 10   Standing paloff press 5# at OMEGA 3 x 10 each side   Sitting row 25# at OMEGA 3 x 10   Sitting chest press 20# at OMEGA 3 x 10   PATIENT EDUCATION:  Education details: HEP, POC, goals  Person educated: Patient Education method: Explanation, Demonstration, and Handouts Education comprehension: verbalized understanding and  returned demonstration  HOME EXERCISE PROGRAM: Access Code: [email or web address] URL: [email or web address] Date: 05/06/2024 Prepared by: Maryanne Finder  Exercises - Seated Piriformis Stretch  - 2-3 x daily - 5-7 x weekly - 3-5 reps - 30-60 seconds hold - Seated Hamstring Stretch  - 2-3 x daily - 5-7 x weekly - 3-5 reps - 30-60 seconds hold - Supine Lower Trunk Rotation  - 2-3 x daily - 5-7 x weekly - 3 sets - 10 reps - Sidelying Thoracic Rotation with Open Book  - 2-3 x daily - 5-7 x weekly - 2 sets - 10 reps - Hooklying Clamshell with Resistance  - 1-2 x daily - 5-7 x weekly - 3 sets - 10 reps - Supine March with Resistance Band  - 1-2 x daily - 5-7 x weekly - 3 sets - 10 reps ASSESSMENT:  CLINICAL IMPRESSION:    Continued PT POC focused on low back pain. Targeted core activation through UE strengthening this date with good tolerance. Pain remained the same at 3/10. Patient will benefit from skilled PT services to address listed impairments to improve quality of life and decrease pain.    OBJECTIVE IMPAIRMENTS: Abnormal gait, decreased activity tolerance, decreased endurance, decreased mobility, difficulty walking, decreased strength, impaired flexibility, postural dysfunction, and pain.   ACTIVITY LIMITATIONS: lifting, bending, standing, squatting, stairs, and locomotion level  PARTICIPATION LIMITATIONS: cleaning, shopping, community activity, and occupation  PERSONAL  FACTORS: Age, Behavior pattern, Past/current experiences, and Time since onset of injury/illness/exacerbation are also affecting patient's functional outcome.   REHAB POTENTIAL: Good  CLINICAL DECISION MAKING: Stable/uncomplicated  EVALUATION COMPLEXITY: Moderate   GOALS: Goals reviewed with patient? Yes  SHORT TERM GOALS: Target date: 05/27/2024  Patient will be independent in HEP to improve strength/mobility for better functional independence with ADLs.  Baseline: Goal status: INITIAL   LONG TERM GOALS: Target date: 06/24/2024  Patient will reduce modified Oswestry score to <20 as to demonstrate minimal disability with ADLs including improved sleeping tolerance, walking/sitting tolerance etc for better mobility with ADLs. Baseline:  Goal status: INITIAL  2.  Patient will report a worst pain of 3/10 on NRPS in low back to improve tolerance with ADLs and reduced symptoms with activities.  Baseline: 04/29/24: 10/10 Goal status: INITIAL  3.  Patient (< 74 years old) will complete five times sit to stand test in < 10 seconds indicating an increased LE strength and improved balance.  Baseline:  Goal status: INITIAL  4.  Patient will improve by 63m (164') in order to demonstrate clinically significant improvement in cardiopulmonary endurance and community ambulation  Baseline:  Goal status: INITIAL  5.  Patient will tolerate standing >30 minutes to demonstrate improved tolerance to activity to be able to complete iADLs at home.  Baseline: 04/29/24: unable to tolerate >10-15 minutes  Goal status: INITIAL   PLAN:  PT FREQUENCY: 1-2x/week  PT DURATION: 8 weeks  PLANNED INTERVENTIONS: 97164- PT Re-evaluation, 97750- Physical Performance Testing, 97110-Therapeutic exercises, 97530- Therapeutic activity, 97112- Neuromuscular re-education, 97535- Self Care, [id number]- Manual therapy, [phone]- Gait training, [phone]- Electrical stimulation (unattended), [phone]- Electrical stimulation  (manual), Patient/Family education, Balance training, Stair training, Joint mobilization, Joint manipulation, Spinal manipulation, Spinal mobilization, Cryotherapy, and Moist heat.  PLAN FOR NEXT SESSION: HEP review, core strengthening, stretching    Maryanne Finder, PT, DPT Physical Therapist - [place]  [facility] 05/12/2024, 11:07 AM

## 2024-05-12 ENCOUNTER — Encounter: Payer: Self-pay | Admitting: Cardiology

## 2024-05-12 ENCOUNTER — Ambulatory Visit

## 2024-05-12 DIAGNOSIS — M6281 Muscle weakness (generalized): Secondary | ICD-10-CM

## 2024-05-12 DIAGNOSIS — M5459 Other low back pain: Secondary | ICD-10-CM | POA: Diagnosis not present

## 2024-05-13 ENCOUNTER — Encounter: Payer: Self-pay | Admitting: Cardiology

## 2024-05-13 DIAGNOSIS — E669 Obesity, unspecified: Secondary | ICD-10-CM

## 2024-05-13 NOTE — Therapy (Signed)
 OUTPATIENT PHYSICAL THERAPY THORACOLUMBAR TREATMENT   Patient Name: Victoria Williamson MRN: [id number] DOB:[birth year], 58 y.o., female Today's Date: 05/14/2024  END OF SESSION:  PT End of Session - 05/14/24 0814     Visit Number 5    Number of Visits 17    Date for Recertification  06/24/24    PT Start Time 0815    PT Stop Time 0857    PT Time Calculation (min) 42 min    Activity Tolerance Patient tolerated treatment well    Behavior During Therapy [facility] for tasks assessed/performed              Past Medical History:  Diagnosis Date   Anticoagulant long-term use    eliquis --- managed by cardiology   Arthritis    Ascending aortic aneurysm 05/2022   had surgical consult 05-21-2022 w/ wayne gold PA ,   4.0 cm per CT 05-21-2022 in epic, active survillance   Chronic low back pain    DDD (degenerative disc disease), lumbar    Diastolic CHF, chronic (HCC)    followed by cardiology;    last echo 07-27-2021 ef 60-65%   DOE (dyspnea on exertion)    Dysautonomia (HCC) 12/18/2013   dx by cardiologist--- dr fernande   [phone]  pt stated no issues in passed few yrs)   Edema of both lower extremities    intermittant per pt   Fatty liver    GAD (generalized anxiety disorder)    History of adenomatous polyp of colon    Hx of skin cancer, basal cell    Hypertension    MDD (major depressive disorder)    PAF (paroxysmal atrial fibrillation) (HCC) 09/2019   cardiologist-  dr b. agbor-etang/  EP cardiologist-- dr cindie   PMB (postmenopausal bleeding)    PONV (postoperative nausea and vomiting)    Pre-diabetes    PSVT (paroxysmal supraventricular tachycardia)    Pulmonary emphysema (HCC)    followed by pcp   Pulmonary nodule    followed by pcp   Spondylolisthesis of lumbar region    Past Surgical History:  Procedure Laterality Date   ATRIAL FIBRILLATION ABLATION N/A 07/15/2020   Procedure: ATRIAL FIBRILLATION ABLATION;  Surgeon: cindie Ole DASEN, MD;  Location: [facility]  INVASIVE CV LAB;  Service: Cardiovascular;  Laterality: N/A;   ATRIAL FIBRILLATION ABLATION N/A 11/29/2023   Procedure: ATRIAL FIBRILLATION ABLATION;  Surgeon: cindie Ole DASEN, MD;  Location: [facility] INVASIVE CV LAB;  Service: Cardiovascular;  Laterality: N/A;   BREAST BIOPSY Right 07/17/2023   MM RT BREAST BX W LOC DEV 1ST LESION IMAGE BX SPEC STEREO GUIDE 07/17/2023 [facility]   CARDIAC CATHETERIZATION  06/03/2013   @[facility]  by dr bosie;  per pt was told normal coronary arteries   CARDIOVERSION N/A 05/11/2020   Procedure: CARDIOVERSION;  Surgeon: Darliss Rogue, MD;  Location: [facility];  Service: Cardiovascular;  Laterality: N/A;   CARPAL TUNNEL RELEASE Left 06/23/2020   Procedure: CARPAL TUNNEL RELEASE;  Surgeon: Murrell Kuba, MD;  Location: [facility];  Service: Orthopedics;  Laterality: Left;  IV REGIONAL FOREARM BLOCK   CARPAL TUNNEL RELEASE Right 2000   COLONOSCOPY  2020   HYSTEROSCOPY W/ ENDOMETRIAL ABLATION  02/23/2005   @[facility] ;  w/ novasure   HYSTEROSCOPY WITH D & C N/A 07/24/2022   Procedure: ATTEMPTED DILATATION AND CURETTAGE /HYSTEROSCOPY;  Surgeon: Cleotilde Ronal RAMAN, MD;  Location: [facility];  Service: Gynecology;  Laterality: N/A;   OPERATIVE ULTRASOUND N/A 07/24/2022   Procedure: OPERATIVE  ULTRASOUND;  Surgeon: Cleotilde Ronal RAMAN, MD;  Location: [facility];  Service: Gynecology;  Laterality: N/A;   REVISION AMPUTATION OF FINGER Left 12/17/2000   @[facility]  by dr sypher;  injury  left little finger   TUBAL LIGATION Bilateral    yrs ago   Patient Active Problem List   Diagnosis Date Noted   Atrial flutter (HCC) 02/27/2024   Atrial fibrillation (HCC) 11/29/2023   Flutter-fibrillation (HCC) 11/21/2023   Palpitations 11/21/2023   Hypercoagulable state due to paroxysmal atrial fibrillation (HCC) 12/22/2022   History of endometrial ablation 08/07/2022   Postmenopausal bleeding 08/07/2022   Cervical stenosis (uterine cervix) 05/29/2022    Vulvar irritation 05/29/2022   Endometrial mass 05/29/2022   Ascending aorta dilatation 05/29/2022   Paraspinal mass 05/29/2022   Prediabetes 05/18/2022   Other fatigue 05/18/2022   Shortness of breath 05/18/2022   Pulmonary emphysema (HCC) 05/18/2022   Esophageal dysphagia 05/18/2022   Right conjunctivitis 04/23/2022   Hematuria 12/01/2021   Abnormal uterine bleeding (AUB) 12/01/2021   Former smoker 10/20/2021   Chronic bilateral low back pain without sciatica 05/15/2021   COVID-19 04/25/2021   Obesity (BMI 30-39.9) 03/17/2021   Bipolar depression (HCC) 03/17/2021   Preventative health care 03/17/2021   Vitamin D deficiency 03/17/2021   Skin lesion 03/17/2021   SVT (supraventricular tachycardia) 11/23/2020   Arthritis    Carpal tunnel syndrome of left wrist 02/04/2020   Chronic heart failure with preserved ejection fraction (HCC) 01/11/2020   Hepatic steatosis 12/18/2019   High serum thyroid  stimulating hormone (TSH) 12/18/2019   Lower extremity edema 12/17/2019   Neck swelling 12/17/2019   Paroxysmal atrial fibrillation (HCC) 12/17/2019   Lumbar radiculitis 01/16/2019   Tubular adenoma 08/22/2018   B12 deficiency 01/14/2018   Essential hypertension 05/19/2014   Gastroesophageal reflux disease without esophagitis 05/19/2014   Vasovagal syndrome 12/25/2013   Anxiety and depression 12/20/2013   Dysautonomia (HCC) 12/18/2013    PCP: [name], NP  REFERRING PROVIDER: [name], NP  REFERRING DIAG:  G89.29,M54.41 (ICD-10-CM) - Chronic bilateral low back pain with right-sided sciatica  Rationale for Evaluation and Treatment: Rehabilitation  THERAPY DIAG:  Other low back pain  Muscle weakness (generalized)  ONSET DATE: chronic  SUBJECTIVE:                                                                                                                                                                                           SUBJECTIVE STATEMENT: Patient  reports she is coming to therapy for her back pain and her MRI showed pinched nerve on the L side. Intermittent pain down her L leg  and to R knee. Patient reports she had early retirement due to not being able to complete her job duties (heavy lifting, walking, squatting, etc.)   PERTINENT HISTORY:  Per MD note on 04/09/24, She has a history of chronic back pain, described as a dull aching pain at the waistline, exacerbated by movement and persistent throughout the day. The pain occasionally radiates down her right leg to the knee and has caused numbness in the left leg in the past. She reports leg weakness, particularly when walking, and attributes some of this to her weight.   PAIN:  Are you having pain? Yes: NPRS scale: 1-2/10; worst 10/10  Pain location: low back  Pain description: dull, ache, constant Aggravating factors: walking, lifting, standing >10-15 minutes  Relieving factors: sitting  PRECAUTIONS: None  RED FLAGS: None   WEIGHT BEARING RESTRICTIONS: No  FALLS:  Has patient fallen in last 6 months? No  LIVING ENVIRONMENT: Lives with: lives with their spouse Lives in: House/apartment Stairs: Yes: External: 4 steps; can reach both Has following equipment at home: None  OCCUPATION: retired   PLOF: Independent  PATIENT GOALS: to relieve some of the pain and be more mobile   OBJECTIVE:  Note: Objective measures were completed at Evaluation unless otherwise noted.  DIAGNOSTIC FINDINGS:  MRI SPINE LUMBAR WO CONTRAST Collected on Feb 26, 2024 9:45 AM  IMPRESSION: Mild degenerative changes of the lumbar spine.  PATIENT SURVEYS:  Modified Oswestry:  MODIFIED OSWESTRY DISABILITY SCALE  Date: 04/29/24 Score  Pain intensity 1 = The pain is bad, but I can manage without having to take (1) I can stand as long as I want but, it increases my pain. pain medication.  2. Personal care (washing, dressing, etc.) 0 =  I can take care of myself normally without causing increased  pain.  3. Lifting 2 = Pain prevents me from lifting heavy weights off the floor, activities (eg. sports, dancing). but I can manage if the weights are conveniently positioned (3) Pain prevents me from going out very often. (eg, on a table).  4. Walking 3 =  Pain prevents me from walking more than  mile.  5. Sitting 0 =  I can sit in any chair as long as I like.  6. Standing 4 =  Pain prevents me from standing more than 10 minutes.  7. Sleeping 5 =  Pain prevents me from sleeping at all.  8. Social Life 1 =  My social life is normal, but it increases my level of pain.  9. Traveling 0 =  I can travel anywhere without increased pain.  10. Employment/ Homemaking 5 = Pain prevents me from performing any job or homemaking chores.  Total 21/50 = 42%   Interpretation of scores: Score Category Description  0-20% Minimal Disability The patient can cope with most living activities. Usually no treatment is indicated apart from advice on lifting, sitting and exercise  21-40% Moderate Disability The patient experiences more pain and difficulty with sitting, lifting and standing. Travel and social life are more difficult and they may be disabled from work. Personal care, sexual activity and sleeping are not grossly affected, and the patient can usually be managed by conservative means  41-60% Severe Disability Pain remains the main problem in this group, but activities of daily living are affected. These patients require a detailed investigation  61-80% Crippled Back pain impinges on all aspects of the patient's life. Positive intervention is required  81-100% Bed-bound  These patients are either  bed-bound or exaggerating their symptoms  Bluford FORBES Zoe DELENA Karon DELENA, et al. Surgery versus conservative management of stable thoracolumbar fracture: the PRESTO feasibility RCT. [place]): [employer]; 2021 Nov. [facility] Technology Assessment, No. 25.62.) Appendix 3, Oswestry Disability Index  category descriptors. Available from: FindJewelers.cz  Minimally Clinically Important Difference (MCID) = 12.8%  COGNITION: Overall cognitive status: Within functional limits for tasks assessed     SENSATION: WFL  MUSCLE LENGTH: Hamstrings: Right 40 deg; Left 45 deg  POSTURE: rounded shoulders and forward head  PALPATION: Tightness noted to lumbar paraspinals (L>R), piriformis bilaterally TTP over spinous process of lumbar spine during CPAs  LUMBAR ROM:   AROM eval  Flexion Fingertips to mid shin   Extension 25%   Right lateral flexion WFL  Left lateral flexion WFL*  Right rotation 50%  Left rotation 50%*   (Blank rows = not tested)  LOWER EXTREMITY ROM:     WFL  LOWER EXTREMITY MMT:    MMT Right eval Left eval  Hip flexion 4+ 4+  Hip extension    Hip abduction 4+ 4+  Hip adduction 4+ 4+  Hip internal rotation    Hip external rotation    Knee flexion 4+ 4+  Knee extension 4+ 4+  Ankle dorsiflexion 4 4  Ankle plantarflexion    Ankle inversion    Ankle eversion     (Blank rows = not tested)  LUMBAR SPECIAL TESTS:  Straight leg raise test: Negative and FABER test: Negative  FUNCTIONAL TESTS:  5 times sit to stand: 21.85 seconds 6 minute walk test: to be completed visit #2  10 meter walk test: to be completed visit #2   GAIT: Distance walked: 29' Assistive device utilized: None Level of assistance: Complete Independence Comments: guarded gait, no other obvious gait deficits   TREATMENT DATE: 05/14/24                                                                                                                     Subjective: Patient reports no pain but sore from last session. She went shopping with a friend yesterday and was walking for an hour when her back started to really hurt and had to find a chair to sit down in.    Therapeutic Exercise:  Nustep level 5-3 x 5 minutes (seat 9) to improve cardiorespiratory  endurance and improve trunk mobility  Lower trunk rotation x 10   Sidelying open book stretch x 10 each side   Hooklying TrA activation with green physioball squeeze 2 x 10 with 3 second hold    Hooklying TrA activation with green physioball squeeze + alternating overhead reach  2 x 10   Supine with feet on green physioball with overhead reach with 3kg med ball 3 x 10   Modified dead bug with feet on green physioball 2 x 10 each side   Seated twists with 3kg med ball 2 x 10 each side   Forward trunk flexion with physioball x 10, L  x 10, R x 10    PATIENT EDUCATION:  Education details: HEP, POC, goals  Person educated: Patient Education method: Explanation, Demonstration, and Handouts Education comprehension: verbalized understanding and returned demonstration  HOME EXERCISE PROGRAM: Access Code: [email or web address] URL: [email or web address] Date: 05/06/2024 Prepared by: Maryanne Finder  Exercises - Seated Piriformis Stretch  - 2-3 x daily - 5-7 x weekly - 3-5 reps - 30-60 seconds hold - Seated Hamstring Stretch  - 2-3 x daily - 5-7 x weekly - 3-5 reps - 30-60 seconds hold - Supine Lower Trunk Rotation  - 2-3 x daily - 5-7 x weekly - 3 sets - 10 reps - Sidelying Thoracic Rotation with Open Book  - 2-3 x daily - 5-7 x weekly - 2 sets - 10 reps - Hooklying Clamshell with Resistance  - 1-2 x daily - 5-7 x weekly - 3 sets - 10 reps - Supine March with Resistance Band  - 1-2 x daily - 5-7 x weekly - 3 sets - 10 reps  ASSESSMENT:  CLINICAL IMPRESSION:     Continued PT POC focused on low back pain. Session focused on core strengthening in supine/hooklying. Adjusted treatment session 2/2 soreness from previous session. Tolerated session well overall with no increase in pain. Patient will benefit from skilled PT services to address listed impairments to improve quality of life and decrease pain.    OBJECTIVE IMPAIRMENTS: Abnormal gait, decreased activity tolerance, decreased  endurance, decreased mobility, difficulty walking, decreased strength, impaired flexibility, postural dysfunction, and pain.   ACTIVITY LIMITATIONS: lifting, bending, standing, squatting, stairs, and locomotion level  PARTICIPATION LIMITATIONS: cleaning, shopping, community activity, and occupation  PERSONAL FACTORS: Age, Behavior pattern, Past/current experiences, and Time since onset of injury/illness/exacerbation are also affecting patient's functional outcome.   REHAB POTENTIAL: Good  CLINICAL DECISION MAKING: Stable/uncomplicated  EVALUATION COMPLEXITY: Moderate   GOALS: Goals reviewed with patient? Yes  SHORT TERM GOALS: Target date: 05/27/2024  Patient will be independent in HEP to improve strength/mobility for better functional independence with ADLs.  Baseline: Goal status: INITIAL   LONG TERM GOALS: Target date: 06/24/2024  Patient will reduce modified Oswestry score to <20 as to demonstrate minimal disability with ADLs including improved sleeping tolerance, walking/sitting tolerance etc for better mobility with ADLs. Baseline:  Goal status: INITIAL  2.  Patient will report a worst pain of 3/10 on NRPS in low back to improve tolerance with ADLs and reduced symptoms with activities.  Baseline: 04/29/24: 10/10 Goal status: INITIAL  3.  Patient (< 21 years old) will complete five times sit to stand test in < 10 seconds indicating an increased LE strength and improved balance.  Baseline:  Goal status: INITIAL  4.  Patient will improve by 20m (164') in order to demonstrate clinically significant improvement in cardiopulmonary endurance and community ambulation  Baseline:  Goal status: INITIAL  5.  Patient will tolerate standing >30 minutes to demonstrate improved tolerance to activity to be able to complete iADLs at home.  Baseline: 04/29/24: unable to tolerate >10-15 minutes  Goal status: INITIAL   PLAN:  PT FREQUENCY: 1-2x/week  PT DURATION: 8  weeks  PLANNED INTERVENTIONS: 97164- PT Re-evaluation, 97750- Physical Performance Testing, 97110-Therapeutic exercises, 97530- Therapeutic activity, 97112- Neuromuscular re-education, 97535- Self Care, [id number]- Manual therapy, [phone]- Gait training, [phone]- Electrical stimulation (unattended), [phone]- Electrical stimulation (manual), Patient/Family education, Balance training, Stair training, Joint mobilization, Joint manipulation, Spinal manipulation, Spinal mobilization, Cryotherapy, and Moist heat.  PLAN FOR NEXT SESSION: HEP review, core strengthening, stretching  Maryanne Finder, PT, DPT Physical Therapist - [place]  [facility] 05/14/2024, 8:14 AM

## 2024-05-14 ENCOUNTER — Ambulatory Visit

## 2024-05-14 DIAGNOSIS — M5459 Other low back pain: Secondary | ICD-10-CM

## 2024-05-14 DIAGNOSIS — M6281 Muscle weakness (generalized): Secondary | ICD-10-CM | POA: Diagnosis not present

## 2024-05-14 MED ORDER — TIRZEPATIDE 5 MG/0.5ML ~~LOC~~ SOAJ: 5.0000 mg | Freq: Once | SUBCUTANEOUS | Status: AC

## 2024-05-14 MED ORDER — TIRZEPATIDE 2.5 MG/0.5ML ~~LOC~~ SOAJ: 2.5000 mg | Freq: Once | SUBCUTANEOUS | Status: AC

## 2024-05-19 ENCOUNTER — Ambulatory Visit

## 2024-05-20 ENCOUNTER — Telehealth: Payer: Self-pay | Admitting: Cardiology

## 2024-05-20 NOTE — Therapy (Incomplete)
 OUTPATIENT PHYSICAL THERAPY THORACOLUMBAR TREATMENT   Patient Name: Victoria Williamson MRN: [id number] DOB:1966-01-18, 58 y.o., female Today's Date: 05/20/2024  END OF SESSION:        Past Medical History:  Diagnosis Date   Anticoagulant long-term use    eliquis --- managed by cardiology   Arthritis    Ascending aortic aneurysm 05/2022   had surgical consult 05-21-2022 w/ wayne gold PA ,   4.0 cm per CT 05-21-2022 in epic, active survillance   Chronic low back pain    DDD (degenerative disc disease), lumbar    Diastolic CHF, chronic (HCC)    followed by cardiology;    last echo 07-27-2021 ef 60-65%   DOE (dyspnea on exertion)    Dysautonomia (HCC) 12/18/2013   dx by cardiologist--- dr fernande   [phone]  pt stated no issues in passed few yrs)   Edema of both lower extremities    intermittant per pt   Fatty liver    GAD (generalized anxiety disorder)    History of adenomatous polyp of colon    Hx of skin cancer, basal cell    Hypertension    MDD (major depressive disorder)    PAF (paroxysmal atrial fibrillation) (HCC) 09/2019   cardiologist-  dr b. agbor-etang/  EP cardiologist-- dr cindie   PMB (postmenopausal bleeding)    PONV (postoperative nausea and vomiting)    Pre-diabetes    PSVT (paroxysmal supraventricular tachycardia)    Pulmonary emphysema (HCC)    followed by pcp   Pulmonary nodule    followed by pcp   Spondylolisthesis of lumbar region    Past Surgical History:  Procedure Laterality Date   ATRIAL FIBRILLATION ABLATION N/A 07/15/2020   Procedure: ATRIAL FIBRILLATION ABLATION;  Surgeon: cindie Ole DASEN, MD;  Location: [facility];  Service: Cardiovascular;  Laterality: N/A;   ATRIAL FIBRILLATION ABLATION N/A 11/29/2023   Procedure: ATRIAL FIBRILLATION ABLATION;  Surgeon: cindie Ole DASEN, MD;  Location: [facility];  Service: Cardiovascular;  Laterality: N/A;   BREAST BIOPSY Right 07/17/2023   MM RT BREAST BX W LOC DEV 1ST LESION  IMAGE BX SPEC STEREO GUIDE 07/17/2023 [facility]   CARDIAC CATHETERIZATION  06/03/2013   @[facility]  by dr bosie;  per pt was told normal coronary arteries   CARDIOVERSION N/A 05/11/2020   Procedure: CARDIOVERSION;  Surgeon: Darliss Rogue, MD;  Location: [facility];  Service: Cardiovascular;  Laterality: N/A;   CARPAL TUNNEL RELEASE Left 06/23/2020   Procedure: CARPAL TUNNEL RELEASE;  Surgeon: Murrell Kuba, MD;  Location: [facility];  Service: Orthopedics;  Laterality: Left;  IV REGIONAL FOREARM BLOCK   CARPAL TUNNEL RELEASE Right 2000   COLONOSCOPY  2020   HYSTEROSCOPY W/ ENDOMETRIAL ABLATION  02/23/2005   @[facility] ;  w/ novasure   HYSTEROSCOPY WITH D & C N/A 07/24/2022   Procedure: ATTEMPTED DILATATION AND CURETTAGE /HYSTEROSCOPY;  Surgeon: Cleotilde Ronal RAMAN, MD;  Location: [facility];  Service: Gynecology;  Laterality: N/A;   OPERATIVE ULTRASOUND N/A 07/24/2022   Procedure: OPERATIVE ULTRASOUND;  Surgeon: Cleotilde Ronal RAMAN, MD;  Location: [facility];  Service: Gynecology;  Laterality: N/A;   REVISION AMPUTATION OF FINGER Left 12/17/2000   @[facility]  by dr sypher;  injury  left little finger   TUBAL LIGATION Bilateral    yrs ago   Patient Active Problem List   Diagnosis Date Noted   Atrial flutter (HCC) 02/27/2024   Atrial fibrillation (HCC) 11/29/2023   Flutter-fibrillation (HCC) 11/21/2023  Palpitations 11/21/2023   Hypercoagulable state due to paroxysmal atrial fibrillation (HCC) 12/22/2022   History of endometrial ablation 08/07/2022   Postmenopausal bleeding 08/07/2022   Cervical stenosis (uterine cervix) 05/29/2022   Vulvar irritation 05/29/2022   Endometrial mass 05/29/2022   Ascending aorta dilatation 05/29/2022   Paraspinal mass 05/29/2022   Prediabetes 05/18/2022   Other fatigue 05/18/2022   Shortness of breath 05/18/2022   Pulmonary emphysema (HCC) 05/18/2022   Esophageal dysphagia 05/18/2022   Right conjunctivitis 04/23/2022    Hematuria 12/01/2021   Abnormal uterine bleeding (AUB) 12/01/2021   Former smoker 10/20/2021   Chronic bilateral low back pain without sciatica 05/15/2021   COVID-19 04/25/2021   Obesity (BMI 30-39.9) 03/17/2021   Bipolar depression (HCC) 03/17/2021   Preventative health care 03/17/2021   Vitamin D deficiency 03/17/2021   Skin lesion 03/17/2021   SVT (supraventricular tachycardia) 11/23/2020   Arthritis    Carpal tunnel syndrome of left wrist 02/04/2020   Chronic heart failure with preserved ejection fraction (HCC) 01/11/2020   Hepatic steatosis 12/18/2019   High serum thyroid  stimulating hormone (TSH) 12/18/2019   Lower extremity edema 12/17/2019   Neck swelling 12/17/2019   Paroxysmal atrial fibrillation (HCC) 12/17/2019   Lumbar radiculitis 01/16/2019   Tubular adenoma 08/22/2018   B12 deficiency 01/14/2018   Essential hypertension 05/19/2014   Gastroesophageal reflux disease without esophagitis 05/19/2014   Vasovagal syndrome 12/25/2013   Anxiety and depression 12/20/2013   Dysautonomia (HCC) 12/18/2013    PCP: [name], NP  REFERRING PROVIDER: [name], NP  REFERRING DIAG:  G89.29,M54.41 (ICD-10-CM) - Chronic bilateral low back pain with right-sided sciatica  Rationale for Evaluation and Treatment: Rehabilitation  THERAPY DIAG:  Muscle weakness (generalized)  Other low back pain  ONSET DATE: chronic  SUBJECTIVE:                                                                                                                                                                                           SUBJECTIVE STATEMENT: Patient reports she is coming to therapy for her back pain and her MRI showed pinched nerve on the L side. Intermittent pain down her L leg and to R knee. Patient reports she had early retirement due to not being able to complete her job duties (heavy lifting, walking, squatting, etc.)   PERTINENT HISTORY:  Per MD note on 04/09/24, She  has a history of chronic back pain, described as a dull aching pain at the waistline, exacerbated by movement and persistent throughout the day. The pain occasionally radiates down her right leg to the knee and has caused numbness in the left leg  in the past. She reports leg weakness, particularly when walking, and attributes some of this to her weight.   PAIN:  Are you having pain? Yes: NPRS scale: 1-2/10; worst 10/10  Pain location: low back  Pain description: dull, ache, constant Aggravating factors: walking, lifting, standing >10-15 minutes  Relieving factors: sitting  PRECAUTIONS: None  RED FLAGS: None   WEIGHT BEARING RESTRICTIONS: No  FALLS:  Has patient fallen in last 6 months? No  LIVING ENVIRONMENT: Lives with: lives with their spouse Lives in: House/apartment Stairs: Yes: External: 4 steps; can reach both Has following equipment at home: None  OCCUPATION: retired   PLOF: Independent  PATIENT GOALS: to relieve some of the pain and be more mobile   OBJECTIVE:  Note: Objective measures were completed at Evaluation unless otherwise noted.  DIAGNOSTIC FINDINGS:  MRI SPINE LUMBAR WO CONTRAST Collected on Feb 26, 2024 9:45 AM  IMPRESSION: Mild degenerative changes of the lumbar spine.  PATIENT SURVEYS:  Modified Oswestry:  MODIFIED OSWESTRY DISABILITY SCALE  Date: 04/29/24 Score  Pain intensity 1 = The pain is bad, but I can manage without having to take (1) I can stand as long as I want but, it increases my pain. pain medication.  2. Personal care (washing, dressing, etc.) 0 =  I can take care of myself normally without causing increased pain.  3. Lifting 2 = Pain prevents me from lifting heavy weights off the floor, activities (eg. sports, dancing). but I can manage if the weights are conveniently positioned (3) Pain prevents me from going out very often. (eg, on a table).  4. Walking 3 =  Pain prevents me from walking more than  mile.  5. Sitting 0 =  I can  sit in any chair as long as I like.  6. Standing 4 =  Pain prevents me from standing more than 10 minutes.  7. Sleeping 5 =  Pain prevents me from sleeping at all.  8. Social Life 1 =  My social life is normal, but it increases my level of pain.  9. Traveling 0 =  I can travel anywhere without increased pain.  10. Employment/ Homemaking 5 = Pain prevents me from performing any job or homemaking chores.  Total 21/50 = 42%   Interpretation of scores: Score Category Description  0-20% Minimal Disability The patient can cope with most living activities. Usually no treatment is indicated apart from advice on lifting, sitting and exercise  21-40% Moderate Disability The patient experiences more pain and difficulty with sitting, lifting and standing. Travel and social life are more difficult and they may be disabled from work. Personal care, sexual activity and sleeping are not grossly affected, and the patient can usually be managed by conservative means  41-60% Severe Disability Pain remains the main problem in this group, but activities of daily living are affected. These patients require a detailed investigation  61-80% Crippled Back pain impinges on all aspects of the patient's life. Positive intervention is required  81-100% Bed-bound  These patients are either bed-bound or exaggerating their symptoms  Bluford FORBES Zoe DELENA Karon DELENA, et al. Surgery versus conservative management of stable thoracolumbar fracture: the PRESTO feasibility RCT. [place] ([place]): [employer]; 2021 Nov. [facility] Technology Assessment, No. 25.62.) Appendix 3, Oswestry Disability Index category descriptors. Available from: FindJewelers.cz  Minimally Clinically Important Difference (MCID) = 12.8%  COGNITION: Overall cognitive status: Within functional limits for tasks assessed     SENSATION: WFL  MUSCLE LENGTH: Hamstrings: Right 40  deg; Left 45 deg  POSTURE: rounded shoulders  and forward head  PALPATION: Tightness noted to lumbar paraspinals (L>R), piriformis bilaterally TTP over spinous process of lumbar spine during CPAs  LUMBAR ROM:   AROM eval  Flexion Fingertips to mid shin   Extension 25%   Right lateral flexion WFL  Left lateral flexion WFL*  Right rotation 50%  Left rotation 50%*   (Blank rows = not tested)  LOWER EXTREMITY ROM:     WFL  LOWER EXTREMITY MMT:    MMT Right eval Left eval  Hip flexion 4+ 4+  Hip extension    Hip abduction 4+ 4+  Hip adduction 4+ 4+  Hip internal rotation    Hip external rotation    Knee flexion 4+ 4+  Knee extension 4+ 4+  Ankle dorsiflexion 4 4  Ankle plantarflexion    Ankle inversion    Ankle eversion     (Blank rows = not tested)  LUMBAR SPECIAL TESTS:  Straight leg raise test: Negative and FABER test: Negative  FUNCTIONAL TESTS:  5 times sit to stand: 21.85 seconds 6 minute walk test: to be completed visit #2  10 meter walk test: to be completed visit #2   GAIT: Distance walked: 64' Assistive device utilized: None Level of assistance: Complete Independence Comments: guarded gait, no other obvious gait deficits   TREATMENT DATE: 05/20/24                                                                                                                    *** Subjective: Patient reports no pain but sore from last session. She went shopping with a friend yesterday and was walking for an hour when her back started to really hurt and had to find a chair to sit down in.    Therapeutic Exercise:  Nustep level 5-3 x 5 minutes (seat 9) to improve cardiorespiratory endurance and improve trunk mobility  Lower trunk rotation x 10   Sidelying open book stretch x 10 each side   Hooklying TrA activation with green physioball squeeze 2 x 10 with 3 second hold    Hooklying TrA activation with green physioball squeeze + alternating overhead reach  2 x 10   Supine with feet on green physioball with  overhead reach with 3kg med ball 3 x 10   Modified dead bug with feet on green physioball 2 x 10 each side   Seated twists with 3kg med ball 2 x 10 each side   Forward trunk flexion with physioball x 10, L x 10, R x 10    PATIENT EDUCATION:  Education details: HEP, POC, goals  Person educated: Patient Education method: Explanation, Demonstration, and Handouts Education comprehension: verbalized understanding and returned demonstration  HOME EXERCISE PROGRAM: Access Code: [email or web address] URL: [email or web address] Date: 05/06/2024 Prepared by: Maryanne Finder  Exercises - Seated Piriformis Stretch  - 2-3 x daily - 5-7 x weekly - 3-5 reps - 30-60 seconds hold - Seated Hamstring Stretch  - 2-3 x daily -  5-7 x weekly - 3-5 reps - 30-60 seconds hold - Supine Lower Trunk Rotation  - 2-3 x daily - 5-7 x weekly - 3 sets - 10 reps - Sidelying Thoracic Rotation with Open Book  - 2-3 x daily - 5-7 x weekly - 2 sets - 10 reps - Hooklying Clamshell with Resistance  - 1-2 x daily - 5-7 x weekly - 3 sets - 10 reps - Supine March with Resistance Band  - 1-2 x daily - 5-7 x weekly - 3 sets - 10 reps  ASSESSMENT:  CLINICAL IMPRESSION:    ***  Continued PT POC focused on low back pain. Session focused on core strengthening in supine/hooklying. Adjusted treatment session 2/2 soreness from previous session. Tolerated session well overall with no increase in pain. Patient will benefit from skilled PT services to address listed impairments to improve quality of life and decrease pain.    OBJECTIVE IMPAIRMENTS: Abnormal gait, decreased activity tolerance, decreased endurance, decreased mobility, difficulty walking, decreased strength, impaired flexibility, postural dysfunction, and pain.   ACTIVITY LIMITATIONS: lifting, bending, standing, squatting, stairs, and locomotion level  PARTICIPATION LIMITATIONS: cleaning, shopping, community activity, and occupation  PERSONAL FACTORS: Age, Behavior  pattern, Past/current experiences, and Time since onset of injury/illness/exacerbation are also affecting patient's functional outcome.   REHAB POTENTIAL: Good  CLINICAL DECISION MAKING: Stable/uncomplicated  EVALUATION COMPLEXITY: Moderate   GOALS: Goals reviewed with patient? Yes  SHORT TERM GOALS: Target date: 05/27/2024  Patient will be independent in HEP to improve strength/mobility for better functional independence with ADLs.  Baseline: Goal status: INITIAL   LONG TERM GOALS: Target date: 06/24/2024  Patient will reduce modified Oswestry score to <20 as to demonstrate minimal disability with ADLs including improved sleeping tolerance, walking/sitting tolerance etc for better mobility with ADLs. Baseline:  Goal status: INITIAL  2.  Patient will report a worst pain of 3/10 on NRPS in low back to improve tolerance with ADLs and reduced symptoms with activities.  Baseline: 04/29/24: 10/10 Goal status: INITIAL  3.  Patient (< 26 years old) will complete five times sit to stand test in < 10 seconds indicating an increased LE strength and improved balance.  Baseline:  Goal status: INITIAL  4.  Patient will improve by 71m (164') in order to demonstrate clinically significant improvement in cardiopulmonary endurance and community ambulation  Baseline:  Goal status: INITIAL  5.  Patient will tolerate standing >30 minutes to demonstrate improved tolerance to activity to be able to complete iADLs at home.  Baseline: 04/29/24: unable to tolerate >10-15 minutes  Goal status: INITIAL   PLAN:  PT FREQUENCY: 1-2x/week  PT DURATION: 8 weeks  PLANNED INTERVENTIONS: 97164- PT Re-evaluation, 97750- Physical Performance Testing, 97110-Therapeutic exercises, 97530- Therapeutic activity, 97112- Neuromuscular re-education, 97535- Self Care, [id number]- Manual therapy, [phone]- Gait training, [phone]- Electrical stimulation (unattended), [phone]- Electrical stimulation (manual), Patient/Family  education, Balance training, Stair training, Joint mobilization, Joint manipulation, Spinal manipulation, Spinal mobilization, Cryotherapy, and Moist heat.  PLAN FOR NEXT SESSION: HEP review, core strengthening, stretching    Maryanne Finder, PT, DPT Physical Therapist - [place]  [facility] 05/20/2024, 10:23 AM

## 2024-05-20 NOTE — Telephone Encounter (Signed)
 [facility] is requesting a callback to see the status of the PA for the wegovy . Please advise

## 2024-05-21 ENCOUNTER — Telehealth: Payer: Self-pay | Admitting: Pharmacy Technician

## 2024-05-21 ENCOUNTER — Other Ambulatory Visit: Payer: Self-pay

## 2024-05-21 ENCOUNTER — Ambulatory Visit

## 2024-05-21 NOTE — Telephone Encounter (Signed)
     Effective 05/06/24, medicaid discontinued coverage of GLP1 medications for weight loss (such as wegovy  and zepbound) unless the patient has a documented history of a heart attack or stroke. Zepbound will continued to be covered for patients with moderate to severe sleep apnea (AHI 15-30). Mounjaro, Trulicity, Orlispat and Byetta are only covered by medicaid if the patient is diabetic. It appears the patient has not had a heart attack nor a stroke nor is diabetic.

## 2024-05-21 NOTE — Telephone Encounter (Signed)
 Called and left detailed voicemail to Tonya with cover my meds regarding patient insurance denial and reason for denial of wegovy . Information given in regards to this, also told Tonya patient was called and given this information as well. Wegovy  taken off patient medication list.

## 2024-05-22 ENCOUNTER — Encounter: Payer: Self-pay | Admitting: Nurse Practitioner

## 2024-05-22 ENCOUNTER — Ambulatory Visit: Payer: Self-pay

## 2024-05-22 NOTE — Telephone Encounter (Signed)
 Can we triage please

## 2024-05-22 NOTE — Telephone Encounter (Signed)
 noted

## 2024-05-22 NOTE — Telephone Encounter (Signed)
 See triage note and disposition

## 2024-05-22 NOTE — Telephone Encounter (Signed)
 FYI Only or Action Required?: FYI only for provider.  Patient was last seen in primary care on 04/09/2024 by [name], NP.  Called Nurse Triage reporting Dizziness, Cough, and Nausea.  Symptoms began yesterday.  Interventions attempted: Other: honey.  Symptoms are: severe dizziness/vertigo since yesterday, something rattling in chest, nausea, productive cough with clear mucus, left ear itching, ear congestion, blurry vision, sore throat and headache started with cough gradually worsening.  Triage Disposition: Go to ED Now (Notify PCP)  Patient/caregiver understands and will follow disposition?: Yes          Copied from CRM [id number]. Topic: Clinical - Red Word Triage >> May 22, 2024  1:03 PM Victoria Williamson wrote: Red Word that prompted transfer to Nurse Triage:  extreme dizziness   Pt stated she is experiencing extreme dizziness and thinks its because she stopped taking the wegovy . ----------------------------------------------------------------------- From previous Reason for Contact - Scheduling: Patient/patient representative is calling to schedule an appointment. Refer to attachments for appointment information. Reason for Disposition  SEVERE dizziness (vertigo) (e.g., unable to walk without assistance)  Answer Assessment - Initial Assessment Questions 1. DESCRIPTION: Describe your dizziness.     She states she has been getting sick feeling on her stomach. She states she feels dizzy and off balance.  2. VERTIGO: Do you feel like either you or the room is spinning or tilting?      Yes.  3. LIGHTHEADED: Do you feel lightheaded? (e.g., somewhat faint, woozy, weak upon standing)     Yes.  4. SEVERITY: How bad is it?  Can you walk?     Severe. She is able to walk but it requiring her husband to help her.  5. ONSET:  When did the dizziness begin?     Yesterday. She states it was severe last night, she had to sit back down when she stood up and needed  assistance from her husband to get to her chair.  6. AGGRAVATING FACTORS: Does anything make it worse? (e.g., standing, change in head position)     Changing head position, reaching over, standing. Improves when sitting still in her chair.  7. CAUSE: What do you think is causing the dizziness?     She had to abruptly stop her Wegovy  because insurance stopped covering it. Last dose on Wednesday.  8. RECURRENT SYMPTOM: Have you had dizziness before? If Yes, ask: When was the last time? What happened that time?     No.  9. OTHER SYMPTOMS: Do you have any other symptoms? (e.g., earache, headache, numbness, tinnitus, vomiting, weakness)     Productive cough (clear mucous) x 1 week, left ear itching, ear congestion, something rattling in my chest, sore throat and headache started with cough, nausea, blurry vision worsening the past few days. Denies fever, chest pain, SOB, unilateral numbness or weakness, changes in speech, vomiting, ear aches.  Protocols used: Dizziness - Vertigo-A-AH

## 2024-05-25 NOTE — Therapy (Incomplete)
 OUTPATIENT PHYSICAL THERAPY THORACOLUMBAR TREATMENT   Patient Name: Victoria Williamson MRN: [id number] DOB:[birth year], 58 y.o., female Today's Date: 05/25/2024  END OF SESSION:        Past Medical History:  Diagnosis Date   Anticoagulant long-term use    eliquis --- managed by cardiology   Arthritis    Ascending aortic aneurysm 05/2022   had surgical consult 05-21-2022 w/ wayne gold PA ,   4.0 cm per CT 05-21-2022 in epic, active survillance   Chronic low back pain    DDD (degenerative disc disease), lumbar    Diastolic CHF, chronic (HCC)    followed by cardiology;    last echo 07-27-2021 ef 60-65%   DOE (dyspnea on exertion)    Dysautonomia (HCC) 12/18/2013   dx by cardiologist--- dr fernande   [phone]  pt stated no issues in passed few yrs)   Edema of both lower extremities    intermittant per pt   Fatty liver    GAD (generalized anxiety disorder)    History of adenomatous polyp of colon    Hx of skin cancer, basal cell    Hypertension    MDD (major depressive disorder)    PAF (paroxysmal atrial fibrillation) (HCC) 09/2019   cardiologist-  dr b. agbor-etang/  EP cardiologist-- dr cindie   PMB (postmenopausal bleeding)    PONV (postoperative nausea and vomiting)    Pre-diabetes    PSVT (paroxysmal supraventricular tachycardia)    Pulmonary emphysema (HCC)    followed by pcp   Pulmonary nodule    followed by pcp   Spondylolisthesis of lumbar region    Past Surgical History:  Procedure Laterality Date   ATRIAL FIBRILLATION ABLATION N/A 07/15/2020   Procedure: ATRIAL FIBRILLATION ABLATION;  Surgeon: cindie Ole DASEN, MD;  Location: [facility];  Service: Cardiovascular;  Laterality: N/A;   ATRIAL FIBRILLATION ABLATION N/A 11/29/2023   Procedure: ATRIAL FIBRILLATION ABLATION;  Surgeon: cindie Ole DASEN, MD;  Location: [facility];  Service: Cardiovascular;  Laterality: N/A;   BREAST BIOPSY Right 07/17/2023   MM RT BREAST BX W LOC DEV 1ST LESION  IMAGE BX SPEC STEREO GUIDE 07/17/2023 [facility]   CARDIAC CATHETERIZATION  06/03/2013   @[facility]  by dr bosie;  per pt was told normal coronary arteries   CARDIOVERSION N/A 05/11/2020   Procedure: CARDIOVERSION;  Surgeon: Darliss Rogue, MD;  Location: [facility];  Service: Cardiovascular;  Laterality: N/A;   CARPAL TUNNEL RELEASE Left 06/23/2020   Procedure: CARPAL TUNNEL RELEASE;  Surgeon: Murrell Kuba, MD;  Location: [facility];  Service: Orthopedics;  Laterality: Left;  IV REGIONAL FOREARM BLOCK   CARPAL TUNNEL RELEASE Right 2000   COLONOSCOPY  2020   HYSTEROSCOPY W/ ENDOMETRIAL ABLATION  02/23/2005   @[facility] ;  w/ novasure   HYSTEROSCOPY WITH D & C N/A 07/24/2022   Procedure: ATTEMPTED DILATATION AND CURETTAGE /HYSTEROSCOPY;  Surgeon: Cleotilde Ronal RAMAN, MD;  Location: [facility];  Service: Gynecology;  Laterality: N/A;   OPERATIVE ULTRASOUND N/A 07/24/2022   Procedure: OPERATIVE ULTRASOUND;  Surgeon: Cleotilde Ronal RAMAN, MD;  Location: [facility];  Service: Gynecology;  Laterality: N/A;   REVISION AMPUTATION OF FINGER Left 12/17/2000   @[facility]  by dr sypher;  injury  left little finger   TUBAL LIGATION Bilateral    yrs ago   Patient Active Problem List   Diagnosis Date Noted   Atrial flutter (HCC) 02/27/2024   Atrial fibrillation (HCC) 11/29/2023   Flutter-fibrillation (HCC) 11/21/2023  Palpitations 11/21/2023   Hypercoagulable state due to paroxysmal atrial fibrillation (HCC) 12/22/2022   History of endometrial ablation 08/07/2022   Postmenopausal bleeding 08/07/2022   Cervical stenosis (uterine cervix) 05/29/2022   Vulvar irritation 05/29/2022   Endometrial mass 05/29/2022   Ascending aorta dilatation 05/29/2022   Paraspinal mass 05/29/2022   Prediabetes 05/18/2022   Other fatigue 05/18/2022   Shortness of breath 05/18/2022   Pulmonary emphysema (HCC) 05/18/2022   Esophageal dysphagia 05/18/2022   Right conjunctivitis 04/23/2022    Hematuria 12/01/2021   Abnormal uterine bleeding (AUB) 12/01/2021   Former smoker 10/20/2021   Chronic bilateral low back pain without sciatica 05/15/2021   COVID-19 04/25/2021   Obesity (BMI 30-39.9) 03/17/2021   Bipolar depression (HCC) 03/17/2021   Preventative health care 03/17/2021   Vitamin D deficiency 03/17/2021   Skin lesion 03/17/2021   SVT (supraventricular tachycardia) 11/23/2020   Arthritis    Carpal tunnel syndrome of left wrist 02/04/2020   Chronic heart failure with preserved ejection fraction (HCC) 01/11/2020   Hepatic steatosis 12/18/2019   High serum thyroid  stimulating hormone (TSH) 12/18/2019   Lower extremity edema 12/17/2019   Neck swelling 12/17/2019   Paroxysmal atrial fibrillation (HCC) 12/17/2019   Lumbar radiculitis 01/16/2019   Tubular adenoma 08/22/2018   B12 deficiency 01/14/2018   Essential hypertension 05/19/2014   Gastroesophageal reflux disease without esophagitis 05/19/2014   Vasovagal syndrome 12/25/2013   Anxiety and depression 12/20/2013   Dysautonomia (HCC) 12/18/2013    PCP: [name], NP  REFERRING PROVIDER: [name], NP  REFERRING DIAG:  G89.29,M54.41 (ICD-10-CM) - Chronic bilateral low back pain with right-sided sciatica  Rationale for Evaluation and Treatment: Rehabilitation  THERAPY DIAG:  Other low back pain  Muscle weakness (generalized)  ONSET DATE: chronic  SUBJECTIVE:                                                                                                                                                                                           SUBJECTIVE STATEMENT: Patient reports she is coming to therapy for her back pain and her MRI showed pinched nerve on the L side. Intermittent pain down her L leg and to R knee. Patient reports she had early retirement due to not being able to complete her job duties (heavy lifting, walking, squatting, etc.)   PERTINENT HISTORY:  Per MD note on 04/09/24, She  has a history of chronic back pain, described as a dull aching pain at the waistline, exacerbated by movement and persistent throughout the day. The pain occasionally radiates down her right leg to the knee and has caused numbness in the left leg  in the past. She reports leg weakness, particularly when walking, and attributes some of this to her weight.   PAIN:  Are you having pain? Yes: NPRS scale: 1-2/10; worst 10/10  Pain location: low back  Pain description: dull, ache, constant Aggravating factors: walking, lifting, standing >10-15 minutes  Relieving factors: sitting  PRECAUTIONS: None  RED FLAGS: None   WEIGHT BEARING RESTRICTIONS: No  FALLS:  Has patient fallen in last 6 months? No  LIVING ENVIRONMENT: Lives with: lives with their spouse Lives in: House/apartment Stairs: Yes: External: 4 steps; can reach both Has following equipment at home: None  OCCUPATION: retired   PLOF: Independent  PATIENT GOALS: to relieve some of the pain and be more mobile   OBJECTIVE:  Note: Objective measures were completed at Evaluation unless otherwise noted.  DIAGNOSTIC FINDINGS:  MRI SPINE LUMBAR WO CONTRAST Collected on Feb 26, 2024 9:45 AM  IMPRESSION: Mild degenerative changes of the lumbar spine.  PATIENT SURVEYS:  Modified Oswestry:  MODIFIED OSWESTRY DISABILITY SCALE  Date: 04/29/24 Score  Pain intensity 1 = The pain is bad, but I can manage without having to take (1) I can stand as long as I want but, it increases my pain. pain medication.  2. Personal care (washing, dressing, etc.) 0 =  I can take care of myself normally without causing increased pain.  3. Lifting 2 = Pain prevents me from lifting heavy weights off the floor, activities (eg. sports, dancing). but I can manage if the weights are conveniently positioned (3) Pain prevents me from going out very often. (eg, on a table).  4. Walking 3 =  Pain prevents me from walking more than  mile.  5. Sitting 0 =  I can  sit in any chair as long as I like.  6. Standing 4 =  Pain prevents me from standing more than 10 minutes.  7. Sleeping 5 =  Pain prevents me from sleeping at all.  8. Social Life 1 =  My social life is normal, but it increases my level of pain.  9. Traveling 0 =  I can travel anywhere without increased pain.  10. Employment/ Homemaking 5 = Pain prevents me from performing any job or homemaking chores.  Total 21/50 = 42%   Interpretation of scores: Score Category Description  0-20% Minimal Disability The patient can cope with most living activities. Usually no treatment is indicated apart from advice on lifting, sitting and exercise  21-40% Moderate Disability The patient experiences more pain and difficulty with sitting, lifting and standing. Travel and social life are more difficult and they may be disabled from work. Personal care, sexual activity and sleeping are not grossly affected, and the patient can usually be managed by conservative means  41-60% Severe Disability Pain remains the main problem in this group, but activities of daily living are affected. These patients require a detailed investigation  61-80% Crippled Back pain impinges on all aspects of the patient's life. Positive intervention is required  81-100% Bed-bound  These patients are either bed-bound or exaggerating their symptoms  Bluford FORBES Zoe DELENA Karon DELENA, et al. Surgery versus conservative management of stable thoracolumbar fracture: the PRESTO feasibility RCT. [place] ([place]): [employer]; 2021 Nov. [facility] Technology Assessment, No. 25.62.) Appendix 3, Oswestry Disability Index category descriptors. Available from: FindJewelers.cz  Minimally Clinically Important Difference (MCID) = 12.8%  COGNITION: Overall cognitive status: Within functional limits for tasks assessed     SENSATION: WFL  MUSCLE LENGTH: Hamstrings: Right 40  deg; Left 45 deg  POSTURE: rounded shoulders  and forward head  PALPATION: Tightness noted to lumbar paraspinals (L>R), piriformis bilaterally TTP over spinous process of lumbar spine during CPAs  LUMBAR ROM:   AROM eval  Flexion Fingertips to mid shin   Extension 25%   Right lateral flexion WFL  Left lateral flexion WFL*  Right rotation 50%  Left rotation 50%*   (Blank rows = not tested)  LOWER EXTREMITY ROM:     WFL  LOWER EXTREMITY MMT:    MMT Right eval Left eval  Hip flexion 4+ 4+  Hip extension    Hip abduction 4+ 4+  Hip adduction 4+ 4+  Hip internal rotation    Hip external rotation    Knee flexion 4+ 4+  Knee extension 4+ 4+  Ankle dorsiflexion 4 4  Ankle plantarflexion    Ankle inversion    Ankle eversion     (Blank rows = not tested)  LUMBAR SPECIAL TESTS:  Straight leg raise test: Negative and FABER test: Negative  FUNCTIONAL TESTS:  5 times sit to stand: 21.85 seconds 6 minute walk test: to be completed visit #2  10 meter walk test: to be completed visit #2   GAIT: Distance walked: 64' Assistive device utilized: None Level of assistance: Complete Independence Comments: guarded gait, no other obvious gait deficits   TREATMENT DATE: 05/25/24                                                                                                                    *** Subjective: Patient reports no pain but sore from last session. She went shopping with a friend yesterday and was walking for an hour when her back started to really hurt and had to find a chair to sit down in.    Therapeutic Exercise:  Nustep level 5-3 x 5 minutes (seat 9) to improve cardiorespiratory endurance and improve trunk mobility  Lower trunk rotation x 10   Sidelying open book stretch x 10 each side   Hooklying TrA activation with green physioball squeeze 2 x 10 with 3 second hold    Hooklying TrA activation with green physioball squeeze + alternating overhead reach  2 x 10   Supine with feet on green physioball with  overhead reach with 3kg med ball 3 x 10   Modified dead bug with feet on green physioball 2 x 10 each side   Seated twists with 3kg med ball 2 x 10 each side   Forward trunk flexion with physioball x 10, L x 10, R x 10    PATIENT EDUCATION:  Education details: HEP, POC, goals  Person educated: Patient Education method: Explanation, Demonstration, and Handouts Education comprehension: verbalized understanding and returned demonstration  HOME EXERCISE PROGRAM: Access Code: [email or web address] URL: [email or web address] Date: 05/06/2024 Prepared by: Maryanne Finder  Exercises - Seated Piriformis Stretch  - 2-3 x daily - 5-7 x weekly - 3-5 reps - 30-60 seconds hold - Seated Hamstring Stretch  - 2-3 x daily -  5-7 x weekly - 3-5 reps - 30-60 seconds hold - Supine Lower Trunk Rotation  - 2-3 x daily - 5-7 x weekly - 3 sets - 10 reps - Sidelying Thoracic Rotation with Open Book  - 2-3 x daily - 5-7 x weekly - 2 sets - 10 reps - Hooklying Clamshell with Resistance  - 1-2 x daily - 5-7 x weekly - 3 sets - 10 reps - Supine March with Resistance Band  - 1-2 x daily - 5-7 x weekly - 3 sets - 10 reps  ASSESSMENT:  CLINICAL IMPRESSION:    ***  Continued PT POC focused on low back pain. Session focused on core strengthening in supine/hooklying. Adjusted treatment session 2/2 soreness from previous session. Tolerated session well overall with no increase in pain. Patient will benefit from skilled PT services to address listed impairments to improve quality of life and decrease pain.    OBJECTIVE IMPAIRMENTS: Abnormal gait, decreased activity tolerance, decreased endurance, decreased mobility, difficulty walking, decreased strength, impaired flexibility, postural dysfunction, and pain.   ACTIVITY LIMITATIONS: lifting, bending, standing, squatting, stairs, and locomotion level  PARTICIPATION LIMITATIONS: cleaning, shopping, community activity, and occupation  PERSONAL FACTORS: Age, Behavior  pattern, Past/current experiences, and Time since onset of injury/illness/exacerbation are also affecting patient's functional outcome.   REHAB POTENTIAL: Good  CLINICAL DECISION MAKING: Stable/uncomplicated  EVALUATION COMPLEXITY: Moderate   GOALS: Goals reviewed with patient? Yes  SHORT TERM GOALS: Target date: 05/27/2024  Patient will be independent in HEP to improve strength/mobility for better functional independence with ADLs.  Baseline: Goal status: INITIAL   LONG TERM GOALS: Target date: 06/24/2024  Patient will reduce modified Oswestry score to <20 as to demonstrate minimal disability with ADLs including improved sleeping tolerance, walking/sitting tolerance etc for better mobility with ADLs. Baseline:  Goal status: INITIAL  2.  Patient will report a worst pain of 3/10 on NRPS in low back to improve tolerance with ADLs and reduced symptoms with activities.  Baseline: 04/29/24: 10/10 Goal status: INITIAL  3.  Patient (< 8 years old) will complete five times sit to stand test in < 10 seconds indicating an increased LE strength and improved balance.  Baseline:  Goal status: INITIAL  4.  Patient will improve by 85m (164') in order to demonstrate clinically significant improvement in cardiopulmonary endurance and community ambulation  Baseline:  Goal status: INITIAL  5.  Patient will tolerate standing >30 minutes to demonstrate improved tolerance to activity to be able to complete iADLs at home.  Baseline: 04/29/24: unable to tolerate >10-15 minutes  Goal status: INITIAL   PLAN:  PT FREQUENCY: 1-2x/week  PT DURATION: 8 weeks  PLANNED INTERVENTIONS: 97164- PT Re-evaluation, 97750- Physical Performance Testing, 97110-Therapeutic exercises, 97530- Therapeutic activity, 97112- Neuromuscular re-education, 97535- Self Care, [id number]- Manual therapy, [id number]- Gait training, [phone]- Electrical stimulation (unattended), [id number]- Electrical stimulation (manual), Patient/Family  education, Balance training, Stair training, Joint mobilization, Joint manipulation, Spinal manipulation, Spinal mobilization, Cryotherapy, and Moist heat.  PLAN FOR NEXT SESSION: HEP review, core strengthening, stretching    Maryanne Finder, PT, DPT Physical Therapist - [place]  [facility] 05/25/2024, 12:27 PM

## 2024-05-26 ENCOUNTER — Ambulatory Visit

## 2024-05-26 DIAGNOSIS — M5459 Other low back pain: Secondary | ICD-10-CM

## 2024-05-26 DIAGNOSIS — M6281 Muscle weakness (generalized): Secondary | ICD-10-CM

## 2024-05-28 ENCOUNTER — Encounter: Payer: Self-pay | Admitting: Nurse Practitioner

## 2024-05-28 ENCOUNTER — Ambulatory Visit

## 2024-05-28 ENCOUNTER — Ambulatory Visit: Admitting: Physical Therapy

## 2024-05-29 ENCOUNTER — Ambulatory Visit
Admission: RE | Admit: 2024-05-29 | Discharge: 2024-05-29 | Disposition: A | Discharge status: Home / Self Care | Source: Ambulatory Visit | Attending: Acute Care | Admitting: Acute Care

## 2024-05-29 DIAGNOSIS — Z122 Encounter for screening for malignant neoplasm of respiratory organs: Secondary | ICD-10-CM | POA: Insufficient documentation

## 2024-05-29 DIAGNOSIS — Z87891 Personal history of nicotine dependence: Secondary | ICD-10-CM | POA: Insufficient documentation

## 2024-06-01 ENCOUNTER — Ambulatory Visit: Admitting: Nurse Practitioner

## 2024-06-01 VITALS — BP 120/70 | HR 65 | Temp 98.0°F | Ht 64.0 in | Wt 236.6 lb

## 2024-06-01 DIAGNOSIS — H8110 Benign paroxysmal vertigo, unspecified ear: Secondary | ICD-10-CM

## 2024-06-01 MED ORDER — MECLIZINE HCL 12.5 MG PO TABS: 12.5000 mg | ORAL_TABLET | Freq: Three times a day (TID) | ORAL | 0 refills | Status: DC | PRN

## 2024-06-01 NOTE — Patient Instructions (Signed)
 Nice to see you today I have sent some medication to the pharmacy  Follow up with me as scheduled in November for your physical and labs or sooner if you need me

## 2024-06-01 NOTE — Progress Notes (Signed)
 Established Patient Office Visit  Subjective   Patient ID: Victoria Williamson, female    DOB: 1965-10-12  Age: 58 y.o. MRN: [id number]  Chief Complaint  Patient presents with   Dizziness    Pt complains of having dizzy spells ongoing for a two weeks. Pt states of head spinning last Tuesday. Says it felt like she was going to pass out and vomit. Lasted 40 minutes. Pt is unsure if dizziness is due to wegovy  medication.       With a history of P-afib, SVT, abnormal TSh, bipolar, dysautonomia    Discussed the use of AI scribe software for clinical note transcription with the patient, who gave verbal consent to proceed.  History of Present Illness Victoria Williamson is a 58 year old female with atrial fibrillation who presents with episodes of dizziness and nausea.  She has been experiencing episodes of dizziness and nausea for the past two weeks. The first episode occurred at home during her normal routine, including cleaning, and was characterized by a spinning sensation, nausea, and a feeling of impending fainting and vomiting. This episode lasted approximately 20 minutes, and she required assistance from her husband to sit down as she was unable to walk straight.  The second episode occurred last Tuesday during a physical therapy appointment. This episode was more severe, with increased nausea and an inability to drive. Her husband had to pick her up, and her brother and sister-in-law brought her car home. This episode lasted about 40 minutes. She has not experienced any further episodes since last Tuesday.  She is concerned about her weight loss medication, Wegovy , which she had to discontinue due to insurance issues, and wonders if this could be related to her symptoms. During the episodes, she did not experience cold sweats, jitters, heart palpitations, or shortness of breath. No numbness, tingling, or slurred speech, although she felt weakness throughout her body during the  episodes.  Her past medical history includes atrial fibrillation. She takes metoprolol  and Eliquis  (apixaban ) for this condition. She previously took furosemide  and potassium, which she stopped two months ago.     Review of Systems  Constitutional:  Negative for chills and fever.  Respiratory:  Negative for shortness of breath.   Cardiovascular:  Negative for chest pain and palpitations.  Gastrointestinal:  Positive for nausea. Negative for abdominal pain.  Neurological:  Positive for dizziness. Negative for headaches.      Objective:     BP 120/70   Pulse 65   Temp 98 F (36.7 C) (Oral)   Ht 5' 4 (1.626 m)   Wt 236 lb 9.6 oz (107.3 kg)   SpO2 98%   BMI 40.61 kg/m    Physical Exam Vitals and nursing note reviewed.  Constitutional:      Appearance: Normal appearance.  HENT:     Right Ear: Tympanic membrane, ear canal and external ear normal.     Left Ear: Tympanic membrane, ear canal and external ear normal.     Mouth/Throat:     Mouth: Mucous membranes are moist.     Pharynx: Oropharynx is clear.  Eyes:     Extraocular Movements: Extraocular movements intact.     Pupils: Pupils are equal, round, and reactive to light.  Cardiovascular:     Rate and Rhythm: Normal rate and regular rhythm.     Heart sounds: Normal heart sounds.  Pulmonary:     Effort: Pulmonary effort is normal.     Breath sounds: Normal breath  sounds.  Lymphadenopathy:     Cervical: No cervical adenopathy.  Neurological:     General: No focal deficit present.     Mental Status: She is alert.     Motor: Motor function is intact.     Coordination: Coordination is intact.     Deep Tendon Reflexes:     Reflex Scores:      Bicep reflexes are 1+ on the right side and 1+ on the left side.      Patellar reflexes are 1+ on the right side and 1+ on the left side.    Comments: Bilateral upper and lower extremity strength 5/5      No results found for any visits on 06/01/24.    The 10-year  ASCVD risk score (Arnett DK, et al., 2019) is: 3%    Assessment & Plan:   Problem List Items Addressed This Visit   None Visit Diagnoses       Benign paroxysmal positional vertigo, unspecified laterality    -  Primary   Relevant Medications   meclizine (ANTIVERT) 12.5 MG tablet     Assessment and Plan Assessment & Plan Benign paroxysmal positional vertigo (BPPV) Symptoms consistent with BPPV, likely due to inner ear issues. Differential diagnosis includes stroke and cardiac issues, but less likely. - Prescribed meclizine up to three times daily as needed for vertigo, with drowsiness as a potential side effect. - Educated on BPPV management, meclizine use, and side effects. - Discussed Epley maneuvers as a future intervention if symptoms persist, noting initial symptom worsening. - Advised to keep meclizine available for future episodes. - Instructed to follow up if symptoms persist, worsen, or if meclizine is ineffective.   Return if symptoms worsen or fail to improve, for As scheudled .    Adina Crandall, NP

## 2024-06-02 ENCOUNTER — Ambulatory Visit

## 2024-06-02 DIAGNOSIS — M5459 Other low back pain: Secondary | ICD-10-CM

## 2024-06-02 DIAGNOSIS — M6281 Muscle weakness (generalized): Secondary | ICD-10-CM | POA: Diagnosis not present

## 2024-06-02 NOTE — Therapy (Signed)
 OUTPATIENT PHYSICAL THERAPY THORACOLUMBAR TREATMENT   Patient Name: Victoria Williamson MRN: [id number] DOB:08-06-1966, 58 y.o., female Today's Date: 06/02/2024  END OF SESSION:  PT End of Session - 06/02/24 1339     Visit Number 6    Number of Visits 17    Date for Recertification  06/24/24    Authorization Type HB auth# [id number] for 13 PT vsts from 9/24-12/23    Authorization - Visit Number 5    Authorization - Number of Visits 13    Progress Note Due on Visit 10    PT Start Time 1340    PT Stop Time 1420    PT Time Calculation (min) 40 min    Activity Tolerance Patient tolerated treatment well    Behavior During Therapy [facility] for tasks assessed/performed              Past Medical History:  Diagnosis Date   Anticoagulant long-term use    eliquis --- managed by cardiology   Arthritis    Ascending aortic aneurysm 05/2022   had surgical consult 05-21-2022 w/ Victoria gold PA ,   4.0 cm per CT 05-21-2022 in epic, active survillance   Chronic low back pain    DDD (degenerative disc disease), lumbar    Diastolic CHF, chronic (HCC)    followed by cardiology;    last echo 07-27-2021 ef 60-65%   DOE (dyspnea on exertion)    Dysautonomia (HCC) 12/18/2013   dx by cardiologist--- dr Victoria Williamson   [phone]  pt stated no issues in passed few yrs)   Edema of both lower extremities    intermittant per pt   Fatty liver    GAD (generalized anxiety disorder)    History of adenomatous polyp of colon    Hx of skin cancer, basal cell    Hypertension    MDD (major depressive disorder)    PAF (paroxysmal atrial fibrillation) (HCC) 09/2019   cardiologist-  dr b. agbor-etang/  EP cardiologist-- dr Victoria   PMB (postmenopausal bleeding)    PONV (postoperative nausea and vomiting)    Pre-diabetes    PSVT (paroxysmal supraventricular tachycardia)    Pulmonary emphysema (HCC)    followed by pcp   Pulmonary nodule    followed by pcp   Spondylolisthesis of lumbar region    Past Surgical  History:  Procedure Laterality Date   ATRIAL FIBRILLATION ABLATION N/A 07/15/2020   Procedure: ATRIAL FIBRILLATION ABLATION;  Surgeon: Victoria Ole DASEN, MD;  Location: [facility];  Service: Cardiovascular;  Laterality: N/A;   ATRIAL FIBRILLATION ABLATION N/A 11/29/2023   Procedure: ATRIAL FIBRILLATION ABLATION;  Surgeon: Victoria Ole DASEN, MD;  Location: [facility];  Service: Cardiovascular;  Laterality: N/A;   BREAST BIOPSY Right 07/17/2023   MM RT BREAST BX W LOC DEV 1ST LESION IMAGE BX SPEC STEREO GUIDE 07/17/2023 [facility]   CARDIAC CATHETERIZATION  06/03/2013   @[facility]  by dr Victoria Williamson;  per pt was told normal coronary arteries   CARDIOVERSION N/A 05/11/2020   Procedure: CARDIOVERSION;  Surgeon: Victoria Rogue, MD;  Location: [facility];  Service: Cardiovascular;  Laterality: N/A;   CARPAL TUNNEL RELEASE Left 06/23/2020   Procedure: CARPAL TUNNEL RELEASE;  Surgeon: Victoria Kuba, MD;  Location: [facility];  Service: Orthopedics;  Laterality: Left;  IV REGIONAL FOREARM BLOCK   CARPAL TUNNEL RELEASE Right 2000   COLONOSCOPY  2020   HYSTEROSCOPY W/ ENDOMETRIAL ABLATION  02/23/2005   @[facility] ;  w/ novasure   HYSTEROSCOPY WITH D &  C N/A 07/24/2022   Procedure: ATTEMPTED DILATATION AND CURETTAGE /HYSTEROSCOPY;  Surgeon: Victoria Ronal RAMAN, MD;  Location: [facility];  Service: Gynecology;  Laterality: N/A;   OPERATIVE ULTRASOUND N/A 07/24/2022   Procedure: OPERATIVE ULTRASOUND;  Surgeon: Victoria Ronal RAMAN, MD;  Location: [facility];  Service: Gynecology;  Laterality: N/A;   REVISION AMPUTATION OF FINGER Left 12/17/2000   @[facility]  by dr Victoria Williamson;  injury  left little finger   TUBAL LIGATION Bilateral    yrs ago   Patient Active Problem List   Diagnosis Date Noted   Atrial flutter (HCC) 02/27/2024   Atrial fibrillation (HCC) 11/29/2023   Flutter-fibrillation (HCC) 11/21/2023   Palpitations 11/21/2023   Hypercoagulable state due to paroxysmal  atrial fibrillation (HCC) 12/22/2022   History of endometrial ablation 08/07/2022   Postmenopausal bleeding 08/07/2022   Cervical stenosis (uterine cervix) 05/29/2022   Vulvar irritation 05/29/2022   Endometrial mass 05/29/2022   Ascending aorta dilatation 05/29/2022   Paraspinal mass 05/29/2022   Prediabetes 05/18/2022   Other fatigue 05/18/2022   Shortness of breath 05/18/2022   Pulmonary emphysema (HCC) 05/18/2022   Esophageal dysphagia 05/18/2022   Right conjunctivitis 04/23/2022   Hematuria 12/01/2021   Abnormal uterine bleeding (AUB) 12/01/2021   Former smoker 10/20/2021   Chronic bilateral low back pain without sciatica 05/15/2021   COVID-19 04/25/2021   Obesity (BMI 30-39.9) 03/17/2021   Bipolar depression (HCC) 03/17/2021   Preventative health care 03/17/2021   Vitamin D deficiency 03/17/2021   Skin lesion 03/17/2021   SVT (supraventricular tachycardia) 11/23/2020   Arthritis    Carpal tunnel syndrome of left wrist 02/04/2020   Chronic heart failure with preserved ejection fraction (HCC) 01/11/2020   Hepatic steatosis 12/18/2019   High serum thyroid  stimulating hormone (TSH) 12/18/2019   Lower extremity edema 12/17/2019   Neck swelling 12/17/2019   Paroxysmal atrial fibrillation (HCC) 12/17/2019   Lumbar radiculitis 01/16/2019   Tubular adenoma 08/22/2018   B12 deficiency 01/14/2018   Essential hypertension 05/19/2014   Gastroesophageal reflux disease without esophagitis 05/19/2014   Vasovagal syndrome 12/25/2013   Anxiety and depression 12/20/2013   Dysautonomia (HCC) 12/18/2013    PCP: Victoria Lynwood HERO, NP  REFERRING PROVIDER: [name], NP  REFERRING DIAG:  G89.29,M54.41 (ICD-10-CM) - Chronic bilateral low back pain with right-sided sciatica  Rationale for Evaluation and Treatment: Rehabilitation  THERAPY DIAG:  No diagnosis found.  ONSET DATE: chronic  SUBJECTIVE:  SUBJECTIVE STATEMENT: Patient reports she is coming to therapy for her back pain and her MRI showed pinched nerve on the L side. Intermittent pain down her L leg and to R knee. Patient reports she had early retirement due to not being able to complete her job duties (heavy lifting, walking, squatting, etc.)   PERTINENT HISTORY:  Per MD note on 04/09/24, She has a history of chronic back pain, described as a dull aching pain at the waistline, exacerbated by movement and persistent throughout the day. The pain occasionally radiates down her right leg to the knee and has caused numbness in the left leg in the past. She reports leg weakness, particularly when walking, and attributes some of this to her weight.   PAIN:  Are you having pain? Yes: NPRS scale: 1-2/10; worst 10/10  Pain location: low back  Pain description: dull, ache, constant Aggravating factors: walking, lifting, standing >10-15 minutes  Relieving factors: sitting  PRECAUTIONS: None  RED FLAGS: None   WEIGHT BEARING RESTRICTIONS: No  FALLS:  Has patient fallen in last 6 months? No  LIVING ENVIRONMENT: Lives with: lives with their spouse Lives in: House/apartment Stairs: Yes: External: 4 steps; can reach both Has following equipment at home: None  OCCUPATION: retired   PLOF: Independent  PATIENT GOALS: to relieve some of the pain and be more mobile   OBJECTIVE:  Note: Objective measures were completed at Evaluation unless otherwise noted.  DIAGNOSTIC FINDINGS:  MRI SPINE LUMBAR WO CONTRAST Collected on Feb 26, 2024 9:45 AM  IMPRESSION: Mild degenerative changes of the lumbar spine.  PATIENT SURVEYS:  Modified Oswestry:  MODIFIED OSWESTRY DISABILITY SCALE  Date: 04/29/24 Score  Pain intensity 1 = The pain is bad, but I can manage without having to take (1) I can stand as long as I want but, it increases my  pain. pain medication.  2. Personal care (washing, dressing, etc.) 0 =  I can take care of myself normally without causing increased pain.  3. Lifting 2 = Pain prevents me from lifting heavy weights off the floor, activities (eg. sports, dancing). but I can manage if the weights are conveniently positioned (3) Pain prevents me from going out very often. (eg, on a table).  4. Walking 3 =  Pain prevents me from walking more than  mile.  5. Sitting 0 =  I can sit in any chair as long as I like.  6. Standing 4 =  Pain prevents me from standing more than 10 minutes.  7. Sleeping 5 =  Pain prevents me from sleeping at all.  8. Social Life 1 =  My social life is normal, but it increases my level of pain.  9. Traveling 0 =  I can travel anywhere without increased pain.  10. Employment/ Homemaking 5 = Pain prevents me from performing any job or homemaking chores.  Total 21/50 = 42%   Interpretation of scores: Score Category Description  0-20% Minimal Disability The patient can cope with most living activities. Usually no treatment is indicated apart from advice on lifting, sitting and exercise  21-40% Moderate Disability The patient experiences more pain and difficulty with sitting, lifting and standing. Travel and social life are more difficult and they may be disabled from work. Personal care, sexual activity and sleeping are not grossly affected, and the patient can usually be managed by conservative means  41-60% Severe Disability Pain remains the main problem in this group, but activities of daily living are affected. These patients  require a detailed investigation  61-80% Crippled Back pain impinges on all aspects of the patient's life. Positive intervention is required  81-100% Bed-bound  These patients are either bed-bound or exaggerating their symptoms  Bluford FORBES Zoe DELENA Karon DELENA, et al. Surgery versus conservative management of stable thoracolumbar fracture: the PRESTO feasibility RCT.  [place]): [employer]; 2021 Nov. [facility] Technology Assessment, No. 25.62.) Appendix 3, Oswestry Disability Index category descriptors. Available from: Findjewelers.cz  Minimally Clinically Important Difference (MCID) = 12.8%  COGNITION: Overall cognitive status: Within functional limits for tasks assessed     SENSATION: WFL  MUSCLE LENGTH: Hamstrings: Right 40 deg; Left 45 deg  POSTURE: rounded shoulders and forward head  PALPATION: Tightness noted to lumbar paraspinals (L>R), piriformis bilaterally TTP over spinous process of lumbar spine during CPAs  LUMBAR ROM:   AROM eval  Flexion Fingertips to mid shin   Extension 25%   Right lateral flexion WFL  Left lateral flexion WFL*  Right rotation 50%  Left rotation 50%*   (Blank rows = not tested)  LOWER EXTREMITY ROM:     WFL  LOWER EXTREMITY MMT:    MMT Right eval Left eval  Hip flexion 4+ 4+  Hip extension    Hip abduction 4+ 4+  Hip adduction 4+ 4+  Hip internal rotation    Hip external rotation    Knee flexion 4+ 4+  Knee extension 4+ 4+  Ankle dorsiflexion 4 4  Ankle plantarflexion    Ankle inversion    Ankle eversion     (Blank rows = not tested)  LUMBAR SPECIAL TESTS:  Straight leg raise test: Negative and FABER test: Negative  FUNCTIONAL TESTS:  5 times sit to stand: 21.85 seconds 6 minute walk test: to be completed visit #2  10 meter walk test: to be completed visit #2   GAIT: Distance walked: 26' Assistive device utilized: None Level of assistance: Complete Independence Comments: guarded gait, no other obvious gait deficits   TREATMENT DATE: 06/02/24                                                                                                                     Subjective: Patient reports no pain at the session. Recently prescribed meclizine for the dizziness symptoms.     Therapeutic Exercise:  Lower trunk rotation x 15, PT assisted  rotation   Supine 90/90 with LE Extension  R/L: 3 x 8 reps, alternating LE   Supine 90/90 with OH med ball reach  1 x 10 - 2 Kg Med Ball  2 x 10 - 3 Kg Med Ball   Forward trunk flexion with physioball x 10, L x 10, R x 10   Therapeutic Activity:  Nustep level 5-2 x 6 minutes (seat 9) to improve cardiorespiratory endurance and improve trunk mobility  TRX Squat for improved  2 x 10   Kettlebell  Squats  1 x 10 - 10# KB   2 x 10 - 20# KB  Seated Pallof Press - Feet Unsupported   3 x 10 - Grey TB  Seated Russian Twist   3 x 20 - 3 Kg Med Ball   Lateral Stepping against resistance.   2 x 10 - Blue TB around thighs.   2 x 10 - Blue TB around ankles     PATIENT EDUCATION:  Education details: HEP, POC, goals  Person educated: Patient Education method: Explanation, Demonstration, and Handouts Education comprehension: verbalized understanding and returned demonstration  HOME EXERCISE PROGRAM: Access Code: [email or web address] URL: [email or web address] Date: 05/06/2024 Prepared by: Maryanne Finder  Exercises - Seated Piriformis Stretch  - 2-3 x daily - 5-7 x weekly - 3-5 reps - 30-60 seconds hold - Seated Hamstring Stretch  - 2-3 x daily - 5-7 x weekly - 3-5 reps - 30-60 seconds hold - Supine Lower Trunk Rotation  - 2-3 x daily - 5-7 x weekly - 3 sets - 10 reps - Sidelying Thoracic Rotation with Open Book  - 2-3 x daily - 5-7 x weekly - 2 sets - 10 reps - Hooklying Clamshell with Resistance  - 1-2 x daily - 5-7 x weekly - 3 sets - 10 reps - Supine March with Resistance Band  - 1-2 x daily - 5-7 x weekly - 3 sets - 10 reps  ASSESSMENT:  CLINICAL IMPRESSION:     Continued PT POC focused on management of LBP. PT focused on increasing intensity with core exercises and increasing LE strength. Good demonstration of proper squat mechanics and able to to tolerate increase in TA exercises. Discussed epley maneuver if BPPV symptoms persist following this appt. Patient will  benefit from skilled PT services to address listed impairments to improve quality of life and decrease pain.     OBJECTIVE IMPAIRMENTS: Abnormal gait, decreased activity tolerance, decreased endurance, decreased mobility, difficulty walking, decreased strength, impaired flexibility, postural dysfunction, and pain.   ACTIVITY LIMITATIONS: lifting, bending, standing, squatting, stairs, and locomotion level  PARTICIPATION LIMITATIONS: cleaning, shopping, community activity, and occupation  PERSONAL FACTORS: Age, Behavior pattern, Past/current experiences, and Time since onset of injury/illness/exacerbation are also affecting patient's functional outcome.   REHAB POTENTIAL: Good  CLINICAL DECISION MAKING: Stable/uncomplicated  EVALUATION COMPLEXITY: Moderate   GOALS: Goals reviewed with patient? Yes  SHORT TERM GOALS: Target date: 05/27/2024  Patient will be independent in HEP to improve strength/mobility for better functional independence with ADLs.  Baseline: Goal status: INITIAL   LONG TERM GOALS: Target date: 06/24/2024  Patient will reduce modified Oswestry score to <20 as to demonstrate minimal disability with ADLs including improved sleeping tolerance, walking/sitting tolerance etc for better mobility with ADLs. Baseline:  Goal status: INITIAL  2.  Patient will report a worst pain of 3/10 on NRPS in low back to improve tolerance with ADLs and reduced symptoms with activities.  Baseline: 04/29/24: 10/10 Goal status: INITIAL  3.  Patient (< 76 years old) will complete five times sit to stand test in < 10 seconds indicating an increased LE strength and improved balance.  Baseline: 21.85 seconds  Goal status: INITIAL  4.  Patient will improve by 10m (164') in order to demonstrate clinically significant improvement in cardiopulmonary endurance and community ambulation  Baseline: 1242' with no AD  Goal status: INITIAL  5.  Patient will tolerate standing >30 minutes to  demonstrate improved tolerance to activity to be able to complete iADLs at home.  Baseline: 04/29/24: unable to tolerate >10-15 minutes  Goal status: INITIAL   PLAN:  PT FREQUENCY: 1-2x/week  PT DURATION: 8 weeks  PLANNED INTERVENTIONS: [id number]- PT Re-evaluation, 97750- Physical Performance Testing, 97110-Therapeutic exercises, 97530- Therapeutic activity, 97112- Neuromuscular re-education, 97535- Self Care, [id number]- Manual therapy, [phone]- Gait training, [phone]- Electrical stimulation (unattended), [phone]- Electrical stimulation (manual), Patient/Family education, Balance training, Stair training, Joint mobilization, Joint manipulation, Spinal manipulation, Spinal mobilization, Cryotherapy, and Moist heat.  PLAN FOR NEXT SESSION: HEP review, core strengthening, stretching   Lonni Pall PT, DPT Physical Therapist- [place]  [facility] 06/02/2024, 1:41 PM

## 2024-06-04 ENCOUNTER — Ambulatory Visit

## 2024-06-04 DIAGNOSIS — M6281 Muscle weakness (generalized): Secondary | ICD-10-CM

## 2024-06-04 DIAGNOSIS — M5459 Other low back pain: Secondary | ICD-10-CM

## 2024-06-04 NOTE — Therapy (Signed)
 OUTPATIENT PHYSICAL THERAPY THORACOLUMBAR TREATMENT   Patient Name: Victoria Williamson MRN: [id number] DOB:27-May-1966, 58 y.o., female Today's Date: 06/04/2024  END OF SESSION:  PT End of Session - 06/04/24 1338     Visit Number 7    Number of Visits 17    Date for Recertification  06/24/24    Authorization Type HB auth# [id number] for 13 PT vsts from 9/24-12/23    Authorization - Visit Number 6    Authorization - Number of Visits 13    Progress Note Due on Visit 10    PT Start Time 1339    PT Stop Time 1420    PT Time Calculation (min) 41 min    Activity Tolerance Patient tolerated treatment well    Behavior During Therapy [facility] for tasks assessed/performed               Past Medical History:  Diagnosis Date   Anticoagulant long-term use    eliquis --- managed by cardiology   Arthritis    Ascending aortic aneurysm 05/2022   had surgical consult 05-21-2022 w/ wayne gold PA ,   4.0 cm per CT 05-21-2022 in epic, active survillance   Chronic low back pain    DDD (degenerative disc disease), lumbar    Diastolic CHF, chronic (HCC)    followed by cardiology;    last echo 07-27-2021 ef 60-65%   DOE (dyspnea on exertion)    Dysautonomia (HCC) 12/18/2013   dx by cardiologist--- dr fernande   [phone]  pt stated no issues in passed few yrs)   Edema of both lower extremities    intermittant per pt   Fatty liver    GAD (generalized anxiety disorder)    History of adenomatous polyp of colon    Hx of skin cancer, basal cell    Hypertension    MDD (major depressive disorder)    PAF (paroxysmal atrial fibrillation) (HCC) 09/2019   cardiologist-  dr b. agbor-etang/  EP cardiologist-- dr cindie   PMB (postmenopausal bleeding)    PONV (postoperative nausea and vomiting)    Pre-diabetes    PSVT (paroxysmal supraventricular tachycardia)    Pulmonary emphysema (HCC)    followed by pcp   Pulmonary nodule    followed by pcp   Spondylolisthesis of lumbar region    Past  Surgical History:  Procedure Laterality Date   ATRIAL FIBRILLATION ABLATION N/A 07/15/2020   Procedure: ATRIAL FIBRILLATION ABLATION;  Surgeon: Cindie Ole DASEN, MD;  Location: [facility];  Service: Cardiovascular;  Laterality: N/A;   ATRIAL FIBRILLATION ABLATION N/A 11/29/2023   Procedure: ATRIAL FIBRILLATION ABLATION;  Surgeon: Cindie Ole DASEN, MD;  Location: [facility];  Service: Cardiovascular;  Laterality: N/A;   BREAST BIOPSY Right 07/17/2023   MM RT BREAST BX W LOC DEV 1ST LESION IMAGE BX SPEC STEREO GUIDE 07/17/2023 [facility]   CARDIAC CATHETERIZATION  06/03/2013   @[facility]  by dr bosie;  per pt was told normal coronary arteries   CARDIOVERSION N/A 05/11/2020   Procedure: CARDIOVERSION;  Surgeon: Darliss Rogue, MD;  Location: [facility];  Service: Cardiovascular;  Laterality: N/A;   CARPAL TUNNEL RELEASE Left 06/23/2020   Procedure: CARPAL TUNNEL RELEASE;  Surgeon: Murrell Kuba, MD;  Location: [facility];  Service: Orthopedics;  Laterality: Left;  IV REGIONAL FOREARM BLOCK   CARPAL TUNNEL RELEASE Right 2000   COLONOSCOPY  2020   HYSTEROSCOPY W/ ENDOMETRIAL ABLATION  02/23/2005   @[facility] ;  w/ novasure   HYSTEROSCOPY WITH  D & C N/A 07/24/2022   Procedure: ATTEMPTED DILATATION AND CURETTAGE /HYSTEROSCOPY;  Surgeon: Cleotilde Ronal RAMAN, MD;  Location: [facility];  Service: Gynecology;  Laterality: N/A;   OPERATIVE ULTRASOUND N/A 07/24/2022   Procedure: OPERATIVE ULTRASOUND;  Surgeon: Cleotilde Ronal RAMAN, MD;  Location: [facility];  Service: Gynecology;  Laterality: N/A;   REVISION AMPUTATION OF FINGER Left 12/17/2000   @[facility]  by dr sypher;  injury  left little finger   TUBAL LIGATION Bilateral    yrs ago   Patient Active Problem List   Diagnosis Date Noted   Atrial flutter (HCC) 02/27/2024   Atrial fibrillation (HCC) 11/29/2023   Flutter-fibrillation (HCC) 11/21/2023   Palpitations 11/21/2023   Hypercoagulable state due to  paroxysmal atrial fibrillation (HCC) 12/22/2022   History of endometrial ablation 08/07/2022   Postmenopausal bleeding 08/07/2022   Cervical stenosis (uterine cervix) 05/29/2022   Vulvar irritation 05/29/2022   Endometrial mass 05/29/2022   Ascending aorta dilatation 05/29/2022   Paraspinal mass 05/29/2022   Prediabetes 05/18/2022   Other fatigue 05/18/2022   Shortness of breath 05/18/2022   Pulmonary emphysema (HCC) 05/18/2022   Esophageal dysphagia 05/18/2022   Right conjunctivitis 04/23/2022   Hematuria 12/01/2021   Abnormal uterine bleeding (AUB) 12/01/2021   Former smoker 10/20/2021   Chronic bilateral low back pain without sciatica 05/15/2021   COVID-19 04/25/2021   Obesity (BMI 30-39.9) 03/17/2021   Bipolar depression (HCC) 03/17/2021   Preventative health care 03/17/2021   Vitamin D deficiency 03/17/2021   Skin lesion 03/17/2021   SVT (supraventricular tachycardia) 11/23/2020   Arthritis    Carpal tunnel syndrome of left wrist 02/04/2020   Chronic heart failure with preserved ejection fraction (HCC) 01/11/2020   Hepatic steatosis 12/18/2019   High serum thyroid  stimulating hormone (TSH) 12/18/2019   Lower extremity edema 12/17/2019   Neck swelling 12/17/2019   Paroxysmal atrial fibrillation (HCC) 12/17/2019   Lumbar radiculitis 01/16/2019   Tubular adenoma 08/22/2018   B12 deficiency 01/14/2018   Essential hypertension 05/19/2014   Gastroesophageal reflux disease without esophagitis 05/19/2014   Vasovagal syndrome 12/25/2013   Anxiety and depression 12/20/2013   Dysautonomia (HCC) 12/18/2013    PCP: [name], NP  REFERRING PROVIDER: [name], NP  REFERRING DIAG:  G89.29,M54.41 (ICD-10-CM) - Chronic bilateral low back pain with right-sided sciatica  Rationale for Evaluation and Treatment: Rehabilitation  THERAPY DIAG:  Other low back pain  Muscle weakness (generalized)  ONSET DATE: chronic  SUBJECTIVE:  SUBJECTIVE STATEMENT: Patient reports she is coming to therapy for her back pain and her MRI showed pinched nerve on the L side. Intermittent pain down her L leg and to R knee. Patient reports she had early retirement due to not being able to complete her job duties (heavy lifting, walking, squatting, etc.)   PERTINENT HISTORY:  Per MD note on 04/09/24, She has a history of chronic back pain, described as a dull aching pain at the waistline, exacerbated by movement and persistent throughout the day. The pain occasionally radiates down her right leg to the knee and has caused numbness in the left leg in the past. She reports leg weakness, particularly when walking, and attributes some of this to her weight.   PAIN:  Are you having pain? Yes: NPRS scale: 1-2/10; worst 10/10  Pain location: low back  Pain description: dull, ache, constant Aggravating factors: walking, lifting, standing >10-15 minutes  Relieving factors: sitting  PRECAUTIONS: None  RED FLAGS: None   WEIGHT BEARING RESTRICTIONS: No  FALLS:  Has patient fallen in last 6 months? No  LIVING ENVIRONMENT: Lives with: lives with their spouse Lives in: House/apartment Stairs: Yes: External: 4 steps; can reach both Has following equipment at home: None  OCCUPATION: retired   PLOF: Independent  PATIENT GOALS: to relieve some of the pain and be more mobile   OBJECTIVE:  Note: Objective measures were completed at Evaluation unless otherwise noted.  DIAGNOSTIC FINDINGS:  MRI SPINE LUMBAR WO CONTRAST Collected on Feb 26, 2024 9:45 AM  IMPRESSION: Mild degenerative changes of the lumbar spine.  PATIENT SURVEYS:  Modified Oswestry:  MODIFIED OSWESTRY DISABILITY SCALE  Date: 04/29/24 Score  Pain intensity 1 = The pain is bad, but I can manage without having to take (1) I can  stand as long as I want but, it increases my pain. pain medication.  2. Personal care (washing, dressing, etc.) 0 =  I can take care of myself normally without causing increased pain.  3. Lifting 2 = Pain prevents me from lifting heavy weights off the floor, activities (eg. sports, dancing). but I can manage if the weights are conveniently positioned (3) Pain prevents me from going out very often. (eg, on a table).  4. Walking 3 =  Pain prevents me from walking more than  mile.  5. Sitting 0 =  I can sit in any chair as long as I like.  6. Standing 4 =  Pain prevents me from standing more than 10 minutes.  7. Sleeping 5 =  Pain prevents me from sleeping at all.  8. Social Life 1 =  My social life is normal, but it increases my level of pain.  9. Traveling 0 =  I can travel anywhere without increased pain.  10. Employment/ Homemaking 5 = Pain prevents me from performing any job or homemaking chores.  Total 21/50 = 42%   Interpretation of scores: Score Category Description  0-20% Minimal Disability The patient can cope with most living activities. Usually no treatment is indicated apart from advice on lifting, sitting and exercise  21-40% Moderate Disability The patient experiences more pain and difficulty with sitting, lifting and standing. Travel and social life are more difficult and they may be disabled from work. Personal care, sexual activity and sleeping are not grossly affected, and the patient can usually be managed by conservative means  41-60% Severe Disability Pain remains the main problem in this group, but activities of daily living are affected. These patients  require a detailed investigation  61-80% Crippled Back pain impinges on all aspects of the patient's life. Positive intervention is required  81-100% Bed-bound  These patients are either bed-bound or exaggerating their symptoms  Bluford FORBES Zoe DELENA Karon DELENA, et al. Surgery versus conservative management of stable  thoracolumbar fracture: the PRESTO feasibility RCT. [place] ([place]): [employer]; 2021 Nov. [facility] Technology Assessment, No. 25.62.) Appendix 3, Oswestry Disability Index category descriptors. Available from: [email or web address].cz  Minimally Clinically Important Difference (MCID) = 12.8%  COGNITION: Overall cognitive status: Within functional limits for tasks assessed     SENSATION: WFL  MUSCLE LENGTH: Hamstrings: Right 40 deg; Left 45 deg  POSTURE: rounded shoulders and forward head  PALPATION: Tightness noted to lumbar paraspinals (L>R), piriformis bilaterally TTP over spinous process of lumbar spine during CPAs  LUMBAR ROM:   AROM eval  Flexion Fingertips to mid shin   Extension 25%   Right lateral flexion WFL  Left lateral flexion WFL*  Right rotation 50%  Left rotation 50%*   (Blank rows = not tested)  LOWER EXTREMITY ROM:     WFL  LOWER EXTREMITY MMT:    MMT Right eval Left eval  Hip flexion 4+ 4+  Hip extension    Hip abduction 4+ 4+  Hip adduction 4+ 4+  Hip internal rotation    Hip external rotation    Knee flexion 4+ 4+  Knee extension 4+ 4+  Ankle dorsiflexion 4 4  Ankle plantarflexion    Ankle inversion    Ankle eversion     (Blank rows = not tested)  LUMBAR SPECIAL TESTS:  Straight leg raise test: Negative and FABER test: Negative  FUNCTIONAL TESTS:  5 times sit to stand: 21.85 seconds 6 minute walk test: to be completed visit #2  10 meter walk test: to be completed visit #2   GAIT: Distance walked: 39' Assistive device utilized: None Level of assistance: Complete Independence Comments: guarded gait, no other obvious gait deficits   TREATMENT DATE: 06/04/24                                                                                                                     Subjective: Patient reported soreness in her knees and thighs following last session. Patient without pain at start of session. No  dizzy spells since initial onset last week. No questions or concerns.   Canalith Repositioning:  BPPV Testing:    + Dix R Hallpike - Observable Nystagmus (Left Torsional upward beating)   Performed Epley maneuver x2 in order to correct BPPV. Improved symptoms noted by patient.     Time Spent educating patient on how to self perform epley maneuver upon onset of symptoms.   Provided updated HEP with R epley maneuver.   Therapeutic Activity (with intent to improve core stabilization with lifting, standing, walking):  Nustep level 5-2 x 6 minutes (seat 9) x UE/LE to improve cardiorespiratory endurance and improve trunk mobility.   Standing Pallof Press - Grey TB - on  Blue Balance Pad  R: 2 x 12   L: 2 x 12   Lateral Stepping against resistance.   4 x 12' - Blue TB around ankles (rest break)   4 x 12' - Blue TB around ankles   Seated Russian Twist with Med ball   3 x 20 - 3 Kg MB   Kettle bell Squat   2 x 10 - 20# KB    PATIENT EDUCATION:  Education details: HEP, POC, goals  Person educated: Patient Education method: Explanation, Demonstration, and Handouts Education comprehension: verbalized understanding and returned demonstration  HOME EXERCISE PROGRAM: Access Code: [email or web address] URL: [email or web address] Date: 06/04/2024 Prepared by: Lonni Pall  Exercises - Seated Piriformis Stretch  - 2-3 x daily - 5-7 x weekly - 3-5 reps - 30-60 seconds hold - Seated Hamstring Stretch  - 2-3 x daily - 5-7 x weekly - 3-5 reps - 30-60 seconds hold - Supine Lower Trunk Rotation  - 2-3 x daily - 5-7 x weekly - 3 sets - 10 reps - Sidelying Thoracic Rotation with Open Book  - 2-3 x daily - 5-7 x weekly - 2 sets - 10 reps - Hooklying Clamshell with Resistance  - 1-2 x daily - 5-7 x weekly - 3 sets - 10 reps - Supine March with Resistance Band  - 1-2 x daily - 5-7 x weekly - 3 sets - 10 reps  Patient Education - Right Epley Maneuver  Access Code: [email or web address] URL:  [email or web address] Date: 05/06/2024 Prepared by: Maryanne Finder  Exercises - Seated Piriformis Stretch  - 2-3 x daily - 5-7 x weekly - 3-5 reps - 30-60 seconds hold - Seated Hamstring Stretch  - 2-3 x daily - 5-7 x weekly - 3-5 reps - 30-60 seconds hold - Supine Lower Trunk Rotation  - 2-3 x daily - 5-7 x weekly - 3 sets - 10 reps - Sidelying Thoracic Rotation with Open Book  - 2-3 x daily - 5-7 x weekly - 2 sets - 10 reps - Hooklying Clamshell with Resistance  - 1-2 x daily - 5-7 x weekly - 3 sets - 10 reps - Supine March with Resistance Band  - 1-2 x daily - 5-7 x weekly - 3 sets - 10 reps  ASSESSMENT:  CLINICAL IMPRESSION:     Continued PT POC focused on management of LBP. PT focused on increasing intensity with core exercises and increasing LE strength.Patient with dizziness upon supine positioning for core exercises. PT tested for R Posterior BPPV; positive dix hall pike with observable nystagmus. Performed epley maneuver x2 with significant improvement of symptoms following intervention. Remainder of session focused on core and gluteal strengthening. Good carryover of squat mechanics after verbal cue wider of BoS. Educated patient on self maneuver techniques with epley maneuver.   Patient will benefit from skilled PT services to address listed impairments to improve quality of life and decrease pain.     OBJECTIVE IMPAIRMENTS: Abnormal gait, decreased activity tolerance, decreased endurance, decreased mobility, difficulty walking, decreased strength, impaired flexibility, postural dysfunction, and pain.   ACTIVITY LIMITATIONS: lifting, bending, standing, squatting, stairs, and locomotion level  PARTICIPATION LIMITATIONS: cleaning, shopping, community activity, and occupation  PERSONAL FACTORS: Age, Behavior pattern, Past/current experiences, and Time since onset of injury/illness/exacerbation are also affecting patient's functional outcome.   REHAB POTENTIAL:  Good  CLINICAL DECISION MAKING: Stable/uncomplicated  EVALUATION COMPLEXITY: Moderate   GOALS: Goals reviewed with patient? Yes  SHORT TERM GOALS: Target date: 05/27/2024  Patient will be independent in HEP to  improve strength/mobility for better functional independence with ADLs.  Baseline: Goal status: INITIAL   LONG TERM GOALS: Target date: 06/24/2024  Patient will reduce modified Oswestry score to <20 as to demonstrate minimal disability with ADLs including improved sleeping tolerance, walking/sitting tolerance etc for better mobility with ADLs. Baseline:  Goal status: INITIAL  2.  Patient will report a worst pain of 3/10 on NRPS in low back to improve tolerance with ADLs and reduced symptoms with activities.  Baseline: 04/29/24: 10/10 Goal status: INITIAL  3.  Patient (< 37 years old) will complete five times sit to stand test in < 10 seconds indicating an increased LE strength and improved balance.  Baseline: 21.85 seconds  Goal status: INITIAL  4.  Patient will improve by 40m (164') in order to demonstrate clinically significant improvement in cardiopulmonary endurance and community ambulation  Baseline: 1242' with no AD  Goal status: INITIAL  5.  Patient will tolerate standing >30 minutes to demonstrate improved tolerance to activity to be able to complete iADLs at home.  Baseline: 04/29/24: unable to tolerate >10-15 minutes  Goal status: INITIAL   PLAN:  PT FREQUENCY: 1-2x/week  PT DURATION: 8 weeks  PLANNED INTERVENTIONS: 97164- PT Re-evaluation, 97750- Physical Performance Testing, 97110-Therapeutic exercises, 97530- Therapeutic activity, 97112- Neuromuscular re-education, 97535- Self Care, [id number]- Manual therapy, [phone]- Gait training, [phone]- Electrical stimulation (unattended), [phone]- Electrical stimulation (manual), Patient/Family education, Balance training, Stair training, Joint mobilization, Joint manipulation, Spinal manipulation, Spinal mobilization,  Cryotherapy, and Moist heat.  PLAN FOR NEXT SESSION: HEP review, core strengthening, stretching   Lonni Pall PT, DPT Physical Therapist-   [facility] 06/04/2024, 1:38 PM

## 2024-06-07 NOTE — Therapy (Incomplete)
 OUTPATIENT PHYSICAL THERAPY THORACOLUMBAR TREATMENT   Patient Name: Victoria Williamson MRN: [id number] DOB:01/25/1966, 58 y.o., female Today's Date: 06/07/2024  END OF SESSION:         Past Medical History:  Diagnosis Date   Anticoagulant long-term use    eliquis --- managed by cardiology   Arthritis    Ascending aortic aneurysm 05/2022   had surgical consult 05-21-2022 w/ wayne gold PA ,   4.0 cm per CT 05-21-2022 in epic, active survillance   Chronic low back pain    DDD (degenerative disc disease), lumbar    Diastolic CHF, chronic (HCC)    followed by cardiology;    last echo 07-27-2021 ef 60-65%   DOE (dyspnea on exertion)    Dysautonomia (HCC) 12/18/2013   dx by cardiologist--- dr fernande   [phone]  pt stated no issues in passed few yrs)   Edema of both lower extremities    intermittant per pt   Fatty liver    GAD (generalized anxiety disorder)    History of adenomatous polyp of colon    Hx of skin cancer, basal cell    Hypertension    MDD (major depressive disorder)    PAF (paroxysmal atrial fibrillation) (HCC) 09/2019   cardiologist-  dr b. agbor-etang/  EP cardiologist-- dr cindie   PMB (postmenopausal bleeding)    PONV (postoperative nausea and vomiting)    Pre-diabetes    PSVT (paroxysmal supraventricular tachycardia)    Pulmonary emphysema (HCC)    followed by pcp   Pulmonary nodule    followed by pcp   Spondylolisthesis of lumbar region    Past Surgical History:  Procedure Laterality Date   ATRIAL FIBRILLATION ABLATION N/A 07/15/2020   Procedure: ATRIAL FIBRILLATION ABLATION;  Surgeon: Cindie Ole DASEN, MD;  Location: [facility];  Service: Cardiovascular;  Laterality: N/A;   ATRIAL FIBRILLATION ABLATION N/A 11/29/2023   Procedure: ATRIAL FIBRILLATION ABLATION;  Surgeon: Cindie Ole DASEN, MD;  Location: [facility];  Service: Cardiovascular;  Laterality: N/A;   BREAST BIOPSY Right 07/17/2023   MM RT BREAST BX W LOC DEV 1ST  LESION IMAGE BX SPEC STEREO GUIDE 07/17/2023 [facility]   CARDIAC CATHETERIZATION  06/03/2013   @[facility]  by dr bosie;  per pt was told normal coronary arteries   CARDIOVERSION N/A 05/11/2020   Procedure: CARDIOVERSION;  Surgeon: Darliss Rogue, MD;  Location: [facility];  Service: Cardiovascular;  Laterality: N/A;   CARPAL TUNNEL RELEASE Left 06/23/2020   Procedure: CARPAL TUNNEL RELEASE;  Surgeon: Murrell Kuba, MD;  Location: [facility];  Service: Orthopedics;  Laterality: Left;  IV REGIONAL FOREARM BLOCK   CARPAL TUNNEL RELEASE Right 2000   COLONOSCOPY  2020   HYSTEROSCOPY W/ ENDOMETRIAL ABLATION  02/23/2005   @[facility] ;  w/ novasure   HYSTEROSCOPY WITH D & C N/A 07/24/2022   Procedure: ATTEMPTED DILATATION AND CURETTAGE /HYSTEROSCOPY;  Surgeon: Cleotilde Ronal RAMAN, MD;  Location: [facility];  Service: Gynecology;  Laterality: N/A;   OPERATIVE ULTRASOUND N/A 07/24/2022   Procedure: OPERATIVE ULTRASOUND;  Surgeon: Cleotilde Ronal RAMAN, MD;  Location: [facility];  Service: Gynecology;  Laterality: N/A;   REVISION AMPUTATION OF FINGER Left 12/17/2000   @[facility]  by dr sypher;  injury  left little finger   TUBAL LIGATION Bilateral    yrs ago   Patient Active Problem List   Diagnosis Date Noted   Atrial flutter (HCC) 02/27/2024   Atrial fibrillation (HCC) 11/29/2023   Flutter-fibrillation (HCC) 11/21/2023  Palpitations 11/21/2023   Hypercoagulable state due to paroxysmal atrial fibrillation (HCC) 12/22/2022   History of endometrial ablation 08/07/2022   Postmenopausal bleeding 08/07/2022   Cervical stenosis (uterine cervix) 05/29/2022   Vulvar irritation 05/29/2022   Endometrial mass 05/29/2022   Ascending aorta dilatation 05/29/2022   Paraspinal mass 05/29/2022   Prediabetes 05/18/2022   Other fatigue 05/18/2022   Shortness of breath 05/18/2022   Pulmonary emphysema (HCC) 05/18/2022   Esophageal dysphagia 05/18/2022   Right conjunctivitis 04/23/2022    Hematuria 12/01/2021   Abnormal uterine bleeding (AUB) 12/01/2021   Former smoker 10/20/2021   Chronic bilateral low back pain without sciatica 05/15/2021   COVID-19 04/25/2021   Obesity (BMI 30-39.9) 03/17/2021   Bipolar depression (HCC) 03/17/2021   Preventative health care 03/17/2021   Vitamin D deficiency 03/17/2021   Skin lesion 03/17/2021   SVT (supraventricular tachycardia) 11/23/2020   Arthritis    Carpal tunnel syndrome of left wrist 02/04/2020   Chronic heart failure with preserved ejection fraction (HCC) 01/11/2020   Hepatic steatosis 12/18/2019   High serum thyroid  stimulating hormone (TSH) 12/18/2019   Lower extremity edema 12/17/2019   Neck swelling 12/17/2019   Paroxysmal atrial fibrillation (HCC) 12/17/2019   Lumbar radiculitis 01/16/2019   Tubular adenoma 08/22/2018   B12 deficiency 01/14/2018   Essential hypertension 05/19/2014   Gastroesophageal reflux disease without esophagitis 05/19/2014   Vasovagal syndrome 12/25/2013   Anxiety and depression 12/20/2013   Dysautonomia (HCC) 12/18/2013    PCP: [name], NP  REFERRING PROVIDER: [name], NP  REFERRING DIAG:  G89.29,M54.41 (ICD-10-CM) - Chronic bilateral low back pain with right-sided sciatica  Rationale for Evaluation and Treatment: Rehabilitation  THERAPY DIAG:  Other low back pain  Muscle weakness (generalized)  Dizziness and giddiness  ONSET DATE: chronic  SUBJECTIVE:                                                                                                                                                                                           SUBJECTIVE STATEMENT: Patient reports she is coming to therapy for her back pain and her MRI showed pinched nerve on the L side. Intermittent pain down her L leg and to R knee. Patient reports she had early retirement due to not being able to complete her job duties (heavy lifting, walking, squatting, etc.)   PERTINENT HISTORY:   Per MD note on 04/09/24, She has a history of chronic back pain, described as a dull aching pain at the waistline, exacerbated by movement and persistent throughout the day. The pain occasionally radiates down her right leg to the knee and has caused numbness  in the left leg in the past. She reports leg weakness, particularly when walking, and attributes some of this to her weight.   PAIN:  Are you having pain? Yes: NPRS scale: 1-2/10; worst 10/10  Pain location: low back  Pain description: dull, ache, constant Aggravating factors: walking, lifting, standing >10-15 minutes  Relieving factors: sitting  PRECAUTIONS: None  RED FLAGS: None   WEIGHT BEARING RESTRICTIONS: No  FALLS:  Has patient fallen in last 6 months? No  LIVING ENVIRONMENT: Lives with: lives with their spouse Lives in: House/apartment Stairs: Yes: External: 4 steps; can reach both Has following equipment at home: None  OCCUPATION: retired   PLOF: Independent  PATIENT GOALS: to relieve some of the pain and be more mobile   OBJECTIVE:  Note: Objective measures were completed at Evaluation unless otherwise noted.  DIAGNOSTIC FINDINGS:  MRI SPINE LUMBAR WO CONTRAST Collected on Feb 26, 2024 9:45 AM  IMPRESSION: Mild degenerative changes of the lumbar spine.  PATIENT SURVEYS:  Modified Oswestry:  MODIFIED OSWESTRY DISABILITY SCALE  Date: 04/29/24 Score  Pain intensity 1 = The pain is bad, but I can manage without having to take (1) I can stand as long as I want but, it increases my pain. pain medication.  2. Personal care (washing, dressing, etc.) 0 =  I can take care of myself normally without causing increased pain.  3. Lifting 2 = Pain prevents me from lifting heavy weights off the floor, activities (eg. sports, dancing). but I can manage if the weights are conveniently positioned (3) Pain prevents me from going out very often. (eg, on a table).  4. Walking 3 =  Pain prevents me from walking more than   mile.  5. Sitting 0 =  I can sit in any chair as long as I like.  6. Standing 4 =  Pain prevents me from standing more than 10 minutes.  7. Sleeping 5 =  Pain prevents me from sleeping at all.  8. Social Life 1 =  My social life is normal, but it increases my level of pain.  9. Traveling 0 =  I can travel anywhere without increased pain.  10. Employment/ Homemaking 5 = Pain prevents me from performing any job or homemaking chores.  Total 21/50 = 42%   Interpretation of scores: Score Category Description  0-20% Minimal Disability The patient can cope with most living activities. Usually no treatment is indicated apart from advice on lifting, sitting and exercise  21-40% Moderate Disability The patient experiences more pain and difficulty with sitting, lifting and standing. Travel and social life are more difficult and they may be disabled from work. Personal care, sexual activity and sleeping are not grossly affected, and the patient can usually be managed by conservative means  41-60% Severe Disability Pain remains the main problem in this group, but activities of daily living are affected. These patients require a detailed investigation  61-80% Crippled Back pain impinges on all aspects of the patient's life. Positive intervention is required  81-100% Bed-bound  These patients are either bed-bound or exaggerating their symptoms  Bluford FORBES Zoe DELENA Karon DELENA, et al. Surgery versus conservative management of stable thoracolumbar fracture: the PRESTO feasibility RCT. [place] ([place]): [employer]; 2021 Nov. [facility] Technology Assessment, No. 25.62.) Appendix 3, Oswestry Disability Index category descriptors. Available from: Findjewelers.cz  Minimally Clinically Important Difference (MCID) = 12.8%  COGNITION: Overall cognitive status: Within functional limits for tasks assessed     SENSATION: [facility]  MUSCLE  LENGTH: Hamstrings: Right 40 deg; Left 45  deg  POSTURE: rounded shoulders and forward head  PALPATION: Tightness noted to lumbar paraspinals (L>R), piriformis bilaterally TTP over spinous process of lumbar spine during CPAs  LUMBAR ROM:   AROM eval  Flexion Fingertips to mid shin   Extension 25%   Right lateral flexion WFL  Left lateral flexion WFL*  Right rotation 50%  Left rotation 50%*   (Blank rows = not tested)  LOWER EXTREMITY ROM:     WFL  LOWER EXTREMITY MMT:    MMT Right eval Left eval  Hip flexion 4+ 4+  Hip extension    Hip abduction 4+ 4+  Hip adduction 4+ 4+  Hip internal rotation    Hip external rotation    Knee flexion 4+ 4+  Knee extension 4+ 4+  Ankle dorsiflexion 4 4  Ankle plantarflexion    Ankle inversion    Ankle eversion     (Blank rows = not tested)  LUMBAR SPECIAL TESTS:  Straight leg raise test: Negative and FABER test: Negative  FUNCTIONAL TESTS:  5 times sit to stand: 21.85 seconds 6 minute walk test: to be completed visit #2  10 meter walk test: to be completed visit #2   GAIT: Distance walked: 73' Assistive device utilized: None Level of assistance: Complete Independence Comments: guarded gait, no other obvious gait deficits   TREATMENT DATE: 06/07/24                                                                                                                     Subjective: ***. No further questions or concerns.    BPPV TESTS:  Symptoms Duration Intensity Nystagmus  L Dix-Hallpike      R Dix-Hallpike      L Head Roll      R Head Roll      L Sidelying Test      R Sidelying Test      (blank = not tested)  Canalith Repositioning Treatment Pt treated with 2 bouts of Epley Maneuver for presumed R posterior canal BPPV. 2 minute holds in each position and retesting between maneuvers as well as after final maneuver.   Patient reported improvements in symptoms following 2 bouts of epley maneuver.   Therapeutic Activity (with intent to improve core stabilization  with lifting, standing, walking):  Nustep level 5-2 x 6 minutes (seat 9) x UE/LE to improve cardiorespiratory endurance and improve trunk mobility.   Standing Pallof Press - Grey TB - on [employer]  R: 2 x 12   L: 2 x 12   Lateral Stepping against resistance.   4 x 12' - Blue TB around ankles (rest break)   4 x 12' - Blue TB around ankles   Seated Russian Twist with Med ball   3 x 20 - 3 Kg MB   Kettle bell Squat   2 x 10 - 20# KB    PATIENT EDUCATION:  Education details: HEP, POC, goals  Person  educated: Patient Education method: Explanation, Demonstration, and Handouts Education comprehension: verbalized understanding and returned demonstration  HOME EXERCISE PROGRAM: Access Code: [email or web address] URL: [email or web address] Date: 06/04/2024 Prepared by: Lonni Pall  Exercises - Seated Piriformis Stretch  - 2-3 x daily - 5-7 x weekly - 3-5 reps - 30-60 seconds hold - Seated Hamstring Stretch  - 2-3 x daily - 5-7 x weekly - 3-5 reps - 30-60 seconds hold - Supine Lower Trunk Rotation  - 2-3 x daily - 5-7 x weekly - 3 sets - 10 reps - Sidelying Thoracic Rotation with Open Book  - 2-3 x daily - 5-7 x weekly - 2 sets - 10 reps - Hooklying Clamshell with Resistance  - 1-2 x daily - 5-7 x weekly - 3 sets - 10 reps - Supine March with Resistance Band  - 1-2 x daily - 5-7 x weekly - 3 sets - 10 reps  Patient Education - Right Epley Maneuver  Access Code: [email or web address] URL: [email or web address] Date: 05/06/2024 Prepared by: Maryanne Finder  Exercises - Seated Piriformis Stretch  - 2-3 x daily - 5-7 x weekly - 3-5 reps - 30-60 seconds hold - Seated Hamstring Stretch  - 2-3 x daily - 5-7 x weekly - 3-5 reps - 30-60 seconds hold - Supine Lower Trunk Rotation  - 2-3 x daily - 5-7 x weekly - 3 sets - 10 reps - Sidelying Thoracic Rotation with Open Book  - 2-3 x daily - 5-7 x weekly - 2 sets - 10 reps - Hooklying Clamshell with Resistance  - 1-2 x daily - 5-7  x weekly - 3 sets - 10 reps - Supine March with Resistance Band  - 1-2 x daily - 5-7 x weekly - 3 sets - 10 reps  ASSESSMENT:  CLINICAL IMPRESSION:     Continued PT POC focused on management of LBP. Session with continued focus on core and LE strengthening. PT deferred retesting for R BPPV due to absence of reported dizziness. BPPV symptoms improved since last session and PT reviewed R Epley maneuver with good carryover from the patient. She tolerated all PT interventions without exacerbation of LBP. Updated PT goals in order to address dizziness. Per self reported score on DHI patient's has *** disability due to dizziness.  Pt will still continue to benefit from skilled PT services in order to address current impairments and maximize improvements towards higher QoL.   OBJECTIVE IMPAIRMENTS: Abnormal gait, decreased activity tolerance, decreased endurance, decreased mobility, difficulty walking, decreased strength, impaired flexibility, postural dysfunction, and pain.   ACTIVITY LIMITATIONS: lifting, bending, standing, squatting, stairs, and locomotion level  PARTICIPATION LIMITATIONS: cleaning, shopping, community activity, and occupation  PERSONAL FACTORS: Age, Behavior pattern, Past/current experiences, and Time since onset of injury/illness/exacerbation are also affecting patient's functional outcome.   REHAB POTENTIAL: Good  CLINICAL DECISION MAKING: Stable/uncomplicated  EVALUATION COMPLEXITY: Moderate   GOALS: Goals reviewed with patient? Yes  SHORT TERM GOALS: Target date: 05/27/2024  Patient will be independent in HEP to improve strength/mobility for better functional independence with ADLs.  Baseline: Goal status: INITIAL   LONG TERM GOALS: Target date: 06/24/2024  Patient will reduce modified Oswestry score to <20 as to demonstrate minimal disability with ADLs including improved sleeping tolerance, walking/sitting tolerance etc for better mobility with ADLs. Baseline:   Goal status: INITIAL  2.  Patient will report a worst pain of 3/10 on NRPS in low back to improve tolerance with ADLs and reduced symptoms with activities.  Baseline: 04/29/24: 10/10 Goal status: INITIAL  3.  Patient (< 22 years old) will complete five times sit to stand test in < 10 seconds indicating an increased LE strength and improved balance.  Baseline: 21.85 seconds  Goal status: INITIAL  4.  Patient will improve by 67m (164') in order to demonstrate clinically significant improvement in cardiopulmonary endurance and community ambulation  Baseline: 1242' with no AD  Goal status: INITIAL  5.  Patient will tolerate standing >30 minutes to demonstrate improved tolerance to activity to be able to complete iADLs at home.  Baseline: 04/29/24: unable to tolerate >10-15 minutes  Goal status: INITIAL  6.  Pt will decrease DHI score by at least 18 points in order to demonstrate clinically significant reduction in disability secondary to dizziness.  Baseline: 06/07/2024: *** Goal status: INITIAL    PLAN:  PT FREQUENCY: 1-2x/week  PT DURATION: 8 weeks  PLANNED INTERVENTIONS: [id number]- PT Re-evaluation, 97750- Physical Performance Testing, 97110-Therapeutic exercises, 97530- Therapeutic activity, 97112- Neuromuscular re-education, 97535- Self Care, [id number]- Manual therapy, [phone]- Gait training, [phone]- Electrical stimulation (unattended), [phone]- Electrical stimulation (manual), Patient/Family education, Balance training, Stair training, Joint mobilization, Joint manipulation, Spinal manipulation, Spinal mobilization, Cryotherapy, and Moist heat.  PLAN FOR NEXT SESSION: HEP review, core strengthening, stretching, canalith repositioning    Lonni Pall PT, DPT Physical Therapist- [facility] 06/07/2024, 6:58 PM

## 2024-06-08 ENCOUNTER — Ambulatory Visit

## 2024-06-08 DIAGNOSIS — M5459 Other low back pain: Secondary | ICD-10-CM

## 2024-06-08 DIAGNOSIS — M6281 Muscle weakness (generalized): Secondary | ICD-10-CM

## 2024-06-08 DIAGNOSIS — R42 Dizziness and giddiness: Secondary | ICD-10-CM

## 2024-06-10 ENCOUNTER — Ambulatory Visit: Attending: Nurse Practitioner

## 2024-06-10 DIAGNOSIS — M5459 Other low back pain: Secondary | ICD-10-CM | POA: Diagnosis not present

## 2024-06-10 DIAGNOSIS — M6281 Muscle weakness (generalized): Secondary | ICD-10-CM | POA: Insufficient documentation

## 2024-06-10 NOTE — Therapy (Signed)
 OUTPATIENT PHYSICAL THERAPY THORACOLUMBAR TREATMENT   Patient Name: Victoria Williamson MRN: [id number] DOB:July 20, 1966, 58 y.o., female Today's Date: 06/10/2024  END OF SESSION:  PT End of Session - 06/10/24 1341     Visit Number 8    Number of Visits 17    Date for Recertification  06/24/24    Authorization Type HB auth# [id number] for 13 PT vsts from 9/24-12/23    Authorization - Number of Visits 13    Progress Note Due on Visit 10    PT Start Time 1338    PT Stop Time 1420    PT Time Calculation (min) 42 min    Activity Tolerance Patient tolerated treatment well    Behavior During Therapy [facility] for tasks assessed/performed           Past Medical History:  Diagnosis Date   Anticoagulant long-term use    eliquis --- managed by cardiology   Arthritis    Ascending aortic aneurysm 05/2022   had surgical consult 05-21-2022 w/ wayne gold PA ,   4.0 cm per CT 05-21-2022 in epic, active survillance   Chronic low back pain    DDD (degenerative disc disease), lumbar    Diastolic CHF, chronic (HCC)    followed by cardiology;    last echo 07-27-2021 ef 60-65%   DOE (dyspnea on exertion)    Dysautonomia (HCC) 12/18/2013   dx by cardiologist--- dr fernande   [phone]  pt stated no issues in passed few yrs)   Edema of both lower extremities    intermittant per pt   Fatty liver    GAD (generalized anxiety disorder)    History of adenomatous polyp of colon    Hx of skin cancer, basal cell    Hypertension    MDD (major depressive disorder)    PAF (paroxysmal atrial fibrillation) (HCC) 09/2019   cardiologist-  dr b. agbor-etang/  EP cardiologist-- dr cindie   PMB (postmenopausal bleeding)    PONV (postoperative nausea and vomiting)    Pre-diabetes    PSVT (paroxysmal supraventricular tachycardia)    Pulmonary emphysema (HCC)    followed by pcp   Pulmonary nodule    followed by pcp   Spondylolisthesis of lumbar region    Past Surgical History:  Procedure Laterality Date    ATRIAL FIBRILLATION ABLATION N/A 07/15/2020   Procedure: ATRIAL FIBRILLATION ABLATION;  Surgeon: Cindie Ole DASEN, MD;  Location: [facility];  Service: Cardiovascular;  Laterality: N/A;   ATRIAL FIBRILLATION ABLATION N/A 11/29/2023   Procedure: ATRIAL FIBRILLATION ABLATION;  Surgeon: Cindie Ole DASEN, MD;  Location: [facility];  Service: Cardiovascular;  Laterality: N/A;   BREAST BIOPSY Right 07/17/2023   MM RT BREAST BX W LOC DEV 1ST LESION IMAGE BX SPEC STEREO GUIDE 07/17/2023 [facility]   CARDIAC CATHETERIZATION  06/03/2013   @[facility]  by dr bosie;  per pt was told normal coronary arteries   CARDIOVERSION N/A 05/11/2020   Procedure: CARDIOVERSION;  Surgeon: Darliss Rogue, MD;  Location: [facility];  Service: Cardiovascular;  Laterality: N/A;   CARPAL TUNNEL RELEASE Left 06/23/2020   Procedure: CARPAL TUNNEL RELEASE;  Surgeon: Murrell Kuba, MD;  Location: [facility];  Service: Orthopedics;  Laterality: Left;  IV REGIONAL FOREARM BLOCK   CARPAL TUNNEL RELEASE Right 2000   COLONOSCOPY  2020   HYSTEROSCOPY W/ ENDOMETRIAL ABLATION  02/23/2005   @[facility] ;  w/ novasure   HYSTEROSCOPY WITH D & C N/A 07/24/2022   Procedure: ATTEMPTED DILATATION AND CURETTAGE /  HYSTEROSCOPY;  Surgeon: Cleotilde Ronal RAMAN, MD;  Location: [facility];  Service: Gynecology;  Laterality: N/A;   OPERATIVE ULTRASOUND N/A 07/24/2022   Procedure: OPERATIVE ULTRASOUND;  Surgeon: Cleotilde Ronal RAMAN, MD;  Location: [facility];  Service: Gynecology;  Laterality: N/A;   REVISION AMPUTATION OF FINGER Left 12/17/2000   @[facility]  by dr sypher;  injury  left little finger   TUBAL LIGATION Bilateral    yrs ago   Patient Active Problem List   Diagnosis Date Noted   Atrial flutter (HCC) 02/27/2024   Atrial fibrillation (HCC) 11/29/2023   Flutter-fibrillation (HCC) 11/21/2023   Palpitations 11/21/2023   Hypercoagulable state due to paroxysmal atrial fibrillation (HCC) 12/22/2022    History of endometrial ablation 08/07/2022   Postmenopausal bleeding 08/07/2022   Cervical stenosis (uterine cervix) 05/29/2022   Vulvar irritation 05/29/2022   Endometrial mass 05/29/2022   Ascending aorta dilatation 05/29/2022   Paraspinal mass 05/29/2022   Prediabetes 05/18/2022   Other fatigue 05/18/2022   Shortness of breath 05/18/2022   Pulmonary emphysema (HCC) 05/18/2022   Esophageal dysphagia 05/18/2022   Right conjunctivitis 04/23/2022   Hematuria 12/01/2021   Abnormal uterine bleeding (AUB) 12/01/2021   Former smoker 10/20/2021   Chronic bilateral low back pain without sciatica 05/15/2021   COVID-19 04/25/2021   Obesity (BMI 30-39.9) 03/17/2021   Bipolar depression (HCC) 03/17/2021   Preventative health care 03/17/2021   Vitamin D deficiency 03/17/2021   Skin lesion 03/17/2021   SVT (supraventricular tachycardia) 11/23/2020   Arthritis    Carpal tunnel syndrome of left wrist 02/04/2020   Chronic heart failure with preserved ejection fraction (HCC) 01/11/2020   Hepatic steatosis 12/18/2019   High serum thyroid  stimulating hormone (TSH) 12/18/2019   Lower extremity edema 12/17/2019   Neck swelling 12/17/2019   Paroxysmal atrial fibrillation (HCC) 12/17/2019   Lumbar radiculitis 01/16/2019   Tubular adenoma 08/22/2018   B12 deficiency 01/14/2018   Essential hypertension 05/19/2014   Gastroesophageal reflux disease without esophagitis 05/19/2014   Vasovagal syndrome 12/25/2013   Anxiety and depression 12/20/2013   Dysautonomia (HCC) 12/18/2013    PCP: [name], NP  REFERRING PROVIDER: [name], NP  REFERRING DIAG:  G89.29,M54.41 (ICD-10-CM) - Chronic bilateral low back pain with right-sided sciatica  Rationale for Evaluation and Treatment: Rehabilitation  THERAPY DIAG:  Other low back pain  Muscle weakness (generalized)  ONSET DATE: chronic  SUBJECTIVE:  SUBJECTIVE STATEMENT: Patient reports she is coming to therapy for her back pain and her MRI showed pinched nerve on the L side. Intermittent pain down her L leg and to R knee. Patient reports she had early retirement due to not being able to complete her job duties (heavy lifting, walking, squatting, etc.)   PERTINENT HISTORY:  Per MD note on 04/09/24, She has a history of chronic back pain, described as a dull aching pain at the waistline, exacerbated by movement and persistent throughout the day. The pain occasionally radiates down her right leg to the knee and has caused numbness in the left leg in the past. She reports leg weakness, particularly when walking, and attributes some of this to her weight.   PAIN:  Are you having pain? Yes: NPRS scale: 1-2/10; worst 10/10  Pain location: low back  Pain description: dull, ache, constant Aggravating factors: walking, lifting, standing >10-15 minutes  Relieving factors: sitting  PRECAUTIONS: None  RED FLAGS: None   WEIGHT BEARING RESTRICTIONS: No  FALLS:  Has patient fallen in last 6 months? No  LIVING ENVIRONMENT: Lives with: lives with their spouse Lives in: House/apartment Stairs: Yes: External: 4 steps; can reach both Has following equipment at home: None  OCCUPATION: retired   PLOF: Independent  PATIENT GOALS: to relieve some of the pain and be more mobile   OBJECTIVE:  Note: Objective measures were completed at Evaluation unless otherwise noted.  DIAGNOSTIC FINDINGS:  MRI SPINE LUMBAR WO CONTRAST Collected on Feb 26, 2024 9:45 AM  IMPRESSION: Mild degenerative changes of the lumbar spine.  PATIENT SURVEYS:  Modified Oswestry:  MODIFIED OSWESTRY DISABILITY SCALE  Date: 04/29/24 Score  Pain intensity 1 = The pain is bad, but I can manage without having to take (1) I can stand as long as I want but, it increases my pain.  pain medication.  2. Personal care (washing, dressing, etc.) 0 =  I can take care of myself normally without causing increased pain.  3. Lifting 2 = Pain prevents me from lifting heavy weights off the floor, activities (eg. sports, dancing). but I can manage if the weights are conveniently positioned (3) Pain prevents me from going out very often. (eg, on a table).  4. Walking 3 =  Pain prevents me from walking more than  mile.  5. Sitting 0 =  I can sit in any chair as long as I like.  6. Standing 4 =  Pain prevents me from standing more than 10 minutes.  7. Sleeping 5 =  Pain prevents me from sleeping at all.  8. Social Life 1 =  My social life is normal, but it increases my level of pain.  9. Traveling 0 =  I can travel anywhere without increased pain.  10. Employment/ Homemaking 5 = Pain prevents me from performing any job or homemaking chores.  Total 21/50 = 42%   Interpretation of scores: Score Category Description  0-20% Minimal Disability The patient can cope with most living activities. Usually no treatment is indicated apart from advice on lifting, sitting and exercise  21-40% Moderate Disability The patient experiences more pain and difficulty with sitting, lifting and standing. Travel and social life are more difficult and they may be disabled from work. Personal care, sexual activity and sleeping are not grossly affected, and the patient can usually be managed by conservative means  41-60% Severe Disability Pain remains the main problem in this group, but activities of daily living are affected. These patients  require a detailed investigation  61-80% Crippled Back pain impinges on all aspects of the patient's life. Positive intervention is required  81-100% Bed-bound  These patients are either bed-bound or exaggerating their symptoms  Bluford FORBES Zoe DELENA Karon DELENA, et al. Surgery versus conservative management of stable thoracolumbar fracture: the PRESTO feasibility RCT. [place]  ([place]): [employer]; 2021 Nov. [facility] Technology Assessment, No. 25.62.) Appendix 3, Oswestry Disability Index category descriptors. Available from: Findjewelers.cz  Minimally Clinically Important Difference (MCID) = 12.8%  COGNITION: Overall cognitive status: Within functional limits for tasks assessed     SENSATION: WFL  MUSCLE LENGTH: Hamstrings: Right 40 deg; Left 45 deg  POSTURE: rounded shoulders and forward head  PALPATION: Tightness noted to lumbar paraspinals (L>R), piriformis bilaterally TTP over spinous process of lumbar spine during CPAs  LUMBAR ROM:   AROM eval  Flexion Fingertips to mid shin   Extension 25%   Right lateral flexion WFL  Left lateral flexion WFL*  Right rotation 50%  Left rotation 50%*   (Blank rows = not tested)  LOWER EXTREMITY ROM:     WFL  LOWER EXTREMITY MMT:    MMT Right eval Left eval  Hip flexion 4+ 4+  Hip extension    Hip abduction 4+ 4+  Hip adduction 4+ 4+  Hip internal rotation    Hip external rotation    Knee flexion 4+ 4+  Knee extension 4+ 4+  Ankle dorsiflexion 4 4  Ankle plantarflexion    Ankle inversion    Ankle eversion     (Blank rows = not tested)  LUMBAR SPECIAL TESTS:  Straight leg raise test: Negative and FABER test: Negative  FUNCTIONAL TESTS:  5 times sit to stand: 21.85 seconds 6 minute walk test: to be completed visit #2  10 meter walk test: to be completed visit #2   GAIT: Distance walked: 42' Assistive device utilized: None Level of assistance: Complete Independence Comments: guarded gait, no other obvious gait deficits   TREATMENT DATE: 06/10/24                                                                                                                     Subjective: Patient without any bouts of dizziness this past weekend. Currently her pain is 4/10 NPS in the lumbar spine. Patient reports that she was participating in churches fall festival and  had pain during the cake walk.  No further questions or concerns.    BPPV TESTS:  Symptoms Duration Intensity Nystagmus  L Dix-Hallpike      R Dix-Hallpike None None None None   L Head Roll      R Head Roll      L Sidelying Test      R Sidelying Test       Blank Box = Not Tested   Therapeutic Exercise:  Supine Posterior Pelvic Tilt   1 x 10 x 10s hold   Supine 90/90 with leg extensions  1 x 10 - Alternating LE  1 x 8 - Alternating LE - minor lower back pain   Supine Anti Rotation with Alternating LE marching   2 x 16 reps - Grey TB  Supine Physioball UE/LE Squeeze for core activation  2 x 10s hold - Green physioball  1 x 15s hold - Green physioball  Passive Piriformis Stretch  R/L: 2 x 1 min ea leg   Passive Double Knee to Chest   2 bouts x 1 min per bout   Passive LE Crossover stretch (for lumbar spine)  R/L: 2 x 1 min ea leg   Therapeutic Activity (with intent to improve core stabilization with lifting, standing, walking):   Nustep level 6-2 x 6 minutes (seat 9) x UE/LE x > 80 SPM to improve cardiorespiratory endurance and improve trunk mobility.   Standing Repeated Extension  1 x 15 reps   Seated Russian Twist   2 x 20 - 3 Kg Med ball   Standing Pallof Press (Omega Cable machine)   R/L: 2 x 10 - 10#  Sit to Stand from Elevated mat table (19)   2 x 10 - Arms Crossed  Walking with Tidal Tank (20#)   4 x 20' BUE support holding across chest   Sled Push   4x 10 m - 25# added    PATIENT EDUCATION:  Education details: Exercise Technique   Person educated: Patient Education method: Explanation, Demonstration, and Handouts Education comprehension: verbalized understanding and returned demonstration  HOME EXERCISE PROGRAM: Access Code: [email or web address] URL: [email or web address] Date: 06/04/2024 Prepared by: Lonni Pall  Exercises - Seated Piriformis Stretch  - 2-3 x daily - 5-7 x weekly - 3-5 reps - 30-60 seconds hold - Seated Hamstring  Stretch  - 2-3 x daily - 5-7 x weekly - 3-5 reps - 30-60 seconds hold - Supine Lower Trunk Rotation  - 2-3 x daily - 5-7 x weekly - 3 sets - 10 reps - Sidelying Thoracic Rotation with Open Book  - 2-3 x daily - 5-7 x weekly - 2 sets - 10 reps - Hooklying Clamshell with Resistance  - 1-2 x daily - 5-7 x weekly - 3 sets - 10 reps - Supine March with Resistance Band  - 1-2 x daily - 5-7 x weekly - 3 sets - 10 reps  Patient Education - Right Epley Maneuver  Access Code: [email or web address] URL: [email or web address] Date: 05/06/2024 Prepared by: Maryanne Finder  Exercises - Seated Piriformis Stretch  - 2-3 x daily - 5-7 x weekly - 3-5 reps - 30-60 seconds hold - Seated Hamstring Stretch  - 2-3 x daily - 5-7 x weekly - 3-5 reps - 30-60 seconds hold - Supine Lower Trunk Rotation  - 2-3 x daily - 5-7 x weekly - 3 sets - 10 reps - Sidelying Thoracic Rotation with Open Book  - 2-3 x daily - 5-7 x weekly - 2 sets - 10 reps - Hooklying Clamshell with Resistance  - 1-2 x daily - 5-7 x weekly - 3 sets - 10 reps - Supine March with Resistance Band  - 1-2 x daily - 5-7 x weekly - 3 sets - 10 reps  ASSESSMENT:  CLINICAL IMPRESSION:     Continued PT POC focused on management of LBP and dizziness. PT re-evaluated symptoms of R BPPV. Negative R dix hall pike without reported symptoms from patient. Remainder of session focused on core and LE strengthening. No exacerbation of LBP throughout session. Reports of tightness in lower back with standing activities however no diffuse pain noted. Passive  stretch techniques utilized in order to increase lumbar ROM and decrease muscle tension. Patient will still benefit from skilled PT services in order to address listed impairments to improve quality of life and decrease pain.    OBJECTIVE IMPAIRMENTS: Abnormal gait, decreased activity tolerance, decreased endurance, decreased mobility, difficulty walking, decreased strength, impaired flexibility, postural  dysfunction, and pain.   ACTIVITY LIMITATIONS: lifting, bending, standing, squatting, stairs, and locomotion level  PARTICIPATION LIMITATIONS: cleaning, shopping, community activity, and occupation  PERSONAL FACTORS: Age, Behavior pattern, Past/current experiences, and Time since onset of injury/illness/exacerbation are also affecting patient's functional outcome.   REHAB POTENTIAL: Good  CLINICAL DECISION MAKING: Stable/uncomplicated  EVALUATION COMPLEXITY: Moderate   GOALS: Goals reviewed with patient? Yes  SHORT TERM GOALS: Target date: 05/27/2024  Patient will be independent in HEP to improve strength/mobility for better functional independence with ADLs.  Baseline: Goal status: INITIAL   LONG TERM GOALS: Target date: 06/24/2024  Patient will reduce modified Oswestry score to <20 as to demonstrate minimal disability with ADLs including improved sleeping tolerance, walking/sitting tolerance etc for better mobility with ADLs. Baseline:  Goal status: INITIAL  2.  Patient will report a worst pain of 3/10 on NRPS in low back to improve tolerance with ADLs and reduced symptoms with activities.  Baseline: 04/29/24: 10/10 Goal status: INITIAL  3.  Patient (< 48 years old) will complete five times sit to stand test in < 10 seconds indicating an increased LE strength and improved balance.  Baseline: 21.85 seconds  Goal status: INITIAL  4.  Patient will improve by 95m (164') in order to demonstrate clinically significant improvement in cardiopulmonary endurance and community ambulation  Baseline: 1242' with no AD  Goal status: INITIAL  5.  Patient will tolerate standing >30 minutes to demonstrate improved tolerance to activity to be able to complete iADLs at home.  Baseline: 04/29/24: unable to tolerate >10-15 minutes  Goal status: INITIAL   PLAN:  PT FREQUENCY: 1-2x/week  PT DURATION: 8 weeks  PLANNED INTERVENTIONS: 97164- PT Re-evaluation, 97750- Physical  Performance Testing, 97110-Therapeutic exercises, 97530- Therapeutic activity, 97112- Neuromuscular re-education, 97535- Self Care, [id number]- Manual therapy, [phone]- Gait training, [phone]- Electrical stimulation (unattended), [phone]- Electrical stimulation (manual), Patient/Family education, Balance training, Stair training, Joint mobilization, Joint manipulation, Spinal manipulation, Spinal mobilization, Cryotherapy, and Moist heat.  PLAN FOR NEXT SESSION: HEP review, core strengthening, stretching   Lonni Pall PT, DPT Physical Therapist- [place]  [facility] 06/10/2024, 1:44 PM

## 2024-06-15 ENCOUNTER — Other Ambulatory Visit: Payer: Self-pay | Admitting: Medical Genetics

## 2024-06-15 ENCOUNTER — Ambulatory Visit

## 2024-06-15 DIAGNOSIS — M5459 Other low back pain: Secondary | ICD-10-CM | POA: Diagnosis not present

## 2024-06-15 DIAGNOSIS — M6281 Muscle weakness (generalized): Secondary | ICD-10-CM

## 2024-06-15 NOTE — Therapy (Signed)
 OUTPATIENT PHYSICAL THERAPY THORACOLUMBAR TREATMENT   Patient Name: Talaysia Pinheiro MRN: [id number] DOB:[birth year], 58 y.o., female Today's Date: 06/15/2024  END OF SESSION:  PT End of Session - 06/15/24 1116     Visit Number 9    Number of Visits 17    Date for Recertification  06/24/24    Authorization Type HB auth# [id number] for 13 PT vsts from 9/24-12/23    Authorization - Number of Visits 13    Progress Note Due on Visit 10    Activity Tolerance Patient tolerated treatment well    Behavior During Therapy [facility] for tasks assessed/performed           Past Medical History:  Diagnosis Date   Anticoagulant long-term use    eliquis --- managed by cardiology   Arthritis    Ascending aortic aneurysm 05/2022   had surgical consult 05-21-2022 w/ wayne gold PA ,   4.0 cm per CT 05-21-2022 in epic, active survillance   Chronic low back pain    DDD (degenerative disc disease), lumbar    Diastolic CHF, chronic (HCC)    followed by cardiology;    last echo 07-27-2021 ef 60-65%   DOE (dyspnea on exertion)    Dysautonomia (HCC) 12/18/2013   dx by cardiologist--- dr fernande   [phone]  pt stated no issues in passed few yrs)   Edema of both lower extremities    intermittant per pt   Fatty liver    GAD (generalized anxiety disorder)    History of adenomatous polyp of colon    Hx of skin cancer, basal cell    Hypertension    MDD (major depressive disorder)    PAF (paroxysmal atrial fibrillation) (HCC) 09/2019   cardiologist-  dr b. agbor-etang/  EP cardiologist-- dr cindie   PMB (postmenopausal bleeding)    PONV (postoperative nausea and vomiting)    Pre-diabetes    PSVT (paroxysmal supraventricular tachycardia)    Pulmonary emphysema (HCC)    followed by pcp   Pulmonary nodule    followed by pcp   Spondylolisthesis of lumbar region    Past Surgical History:  Procedure Laterality Date   ATRIAL FIBRILLATION ABLATION N/A 07/15/2020   Procedure: ATRIAL FIBRILLATION  ABLATION;  Surgeon: Cindie Ole DASEN, MD;  Location: [facility];  Service: Cardiovascular;  Laterality: N/A;   ATRIAL FIBRILLATION ABLATION N/A 11/29/2023   Procedure: ATRIAL FIBRILLATION ABLATION;  Surgeon: Cindie Ole DASEN, MD;  Location: [facility];  Service: Cardiovascular;  Laterality: N/A;   BREAST BIOPSY Right 07/17/2023   MM RT BREAST BX W LOC DEV 1ST LESION IMAGE BX SPEC STEREO GUIDE 07/17/2023 [facility]   CARDIAC CATHETERIZATION  06/03/2013   @[facility]  by dr bosie;  per pt was told normal coronary arteries   CARDIOVERSION N/A 05/11/2020   Procedure: CARDIOVERSION;  Surgeon: Darliss Rogue, MD;  Location: [facility];  Service: Cardiovascular;  Laterality: N/A;   CARPAL TUNNEL RELEASE Left 06/23/2020   Procedure: CARPAL TUNNEL RELEASE;  Surgeon: Murrell Kuba, MD;  Location: [facility];  Service: Orthopedics;  Laterality: Left;  IV REGIONAL FOREARM BLOCK   CARPAL TUNNEL RELEASE Right 2000   COLONOSCOPY  2020   HYSTEROSCOPY W/ ENDOMETRIAL ABLATION  02/23/2005   @[facility] ;  w/ novasure   HYSTEROSCOPY WITH D & C N/A 07/24/2022   Procedure: ATTEMPTED DILATATION AND CURETTAGE /HYSTEROSCOPY;  Surgeon: Cleotilde Ronal RAMAN, MD;  Location: [facility];  Service: Gynecology;  Laterality: N/A;   OPERATIVE ULTRASOUND  N/A 07/24/2022   Procedure: OPERATIVE ULTRASOUND;  Surgeon: Cleotilde Ronal RAMAN, MD;  Location: [facility];  Service: Gynecology;  Laterality: N/A;   REVISION AMPUTATION OF FINGER Left 12/17/2000   @[facility]  by dr sypher;  injury  left little finger   TUBAL LIGATION Bilateral    yrs ago   Patient Active Problem List   Diagnosis Date Noted   Atrial flutter (HCC) 02/27/2024   Atrial fibrillation (HCC) 11/29/2023   Flutter-fibrillation (HCC) 11/21/2023   Palpitations 11/21/2023   Hypercoagulable state due to paroxysmal atrial fibrillation (HCC) 12/22/2022   History of endometrial ablation 08/07/2022   Postmenopausal bleeding  08/07/2022   Cervical stenosis (uterine cervix) 05/29/2022   Vulvar irritation 05/29/2022   Endometrial mass 05/29/2022   Ascending aorta dilatation 05/29/2022   Paraspinal mass 05/29/2022   Prediabetes 05/18/2022   Other fatigue 05/18/2022   Shortness of breath 05/18/2022   Pulmonary emphysema (HCC) 05/18/2022   Esophageal dysphagia 05/18/2022   Right conjunctivitis 04/23/2022   Hematuria 12/01/2021   Abnormal uterine bleeding (AUB) 12/01/2021   Former smoker 10/20/2021   Chronic bilateral low back pain without sciatica 05/15/2021   COVID-19 04/25/2021   Obesity (BMI 30-39.9) 03/17/2021   Bipolar depression (HCC) 03/17/2021   Preventative health care 03/17/2021   Vitamin D deficiency 03/17/2021   Skin lesion 03/17/2021   SVT (supraventricular tachycardia) 11/23/2020   Arthritis    Carpal tunnel syndrome of left wrist 02/04/2020   Chronic heart failure with preserved ejection fraction (HCC) 01/11/2020   Hepatic steatosis 12/18/2019   High serum thyroid  stimulating hormone (TSH) 12/18/2019   Lower extremity edema 12/17/2019   Neck swelling 12/17/2019   Paroxysmal atrial fibrillation (HCC) 12/17/2019   Lumbar radiculitis 01/16/2019   Tubular adenoma 08/22/2018   B12 deficiency 01/14/2018   Essential hypertension 05/19/2014   Gastroesophageal reflux disease without esophagitis 05/19/2014   Vasovagal syndrome 12/25/2013   Anxiety and depression 12/20/2013   Dysautonomia (HCC) 12/18/2013    PCP: [name], NP  REFERRING PROVIDER: [name], NP  REFERRING DIAG:  G89.29,M54.41 (ICD-10-CM) - Chronic bilateral low back pain with right-sided sciatica  Rationale for Evaluation and Treatment: Rehabilitation  THERAPY DIAG:  No diagnosis found.  ONSET DATE: chronic  SUBJECTIVE:                                                                                                                                                                                            SUBJECTIVE STATEMENT: Patient reports she is coming to therapy for her back pain and her MRI showed pinched nerve on the L side. Intermittent pain down her L  leg and to R knee. Patient reports she had early retirement due to not being able to complete her job duties (heavy lifting, walking, squatting, etc.)   PERTINENT HISTORY:  Per MD note on 04/09/24, She has a history of chronic back pain, described as a dull aching pain at the waistline, exacerbated by movement and persistent throughout the day. The pain occasionally radiates down her right leg to the knee and has caused numbness in the left leg in the past. She reports leg weakness, particularly when walking, and attributes some of this to her weight.   PAIN:  Are you having pain? Yes: NPRS scale: 1-2/10; worst 10/10  Pain location: low back  Pain description: dull, ache, constant Aggravating factors: walking, lifting, standing >10-15 minutes  Relieving factors: sitting  PRECAUTIONS: None  RED FLAGS: None   WEIGHT BEARING RESTRICTIONS: No  FALLS:  Has patient fallen in last 6 months? No  LIVING ENVIRONMENT: Lives with: lives with their spouse Lives in: House/apartment Stairs: Yes: External: 4 steps; can reach both Has following equipment at home: None  OCCUPATION: retired   PLOF: Independent  PATIENT GOALS: to relieve some of the pain and be more mobile   OBJECTIVE:  Note: Objective measures were completed at Evaluation unless otherwise noted.  DIAGNOSTIC FINDINGS:  MRI SPINE LUMBAR WO CONTRAST Collected on Feb 26, 2024 9:45 AM  IMPRESSION: Mild degenerative changes of the lumbar spine.  PATIENT SURVEYS:  Modified Oswestry:  MODIFIED OSWESTRY DISABILITY SCALE  Date: 04/29/24 Score  Pain intensity 1 = The pain is bad, but I can manage without having to take (1) I can stand as long as I want but, it increases my pain. pain medication.  2. Personal care (washing, dressing, etc.) 0 =  I can take care of myself  normally without causing increased pain.  3. Lifting 2 = Pain prevents me from lifting heavy weights off the floor, activities (eg. sports, dancing). but I can manage if the weights are conveniently positioned (3) Pain prevents me from going out very often. (eg, on a table).  4. Walking 3 =  Pain prevents me from walking more than  mile.  5. Sitting 0 =  I can sit in any chair as long as I like.  6. Standing 4 =  Pain prevents me from standing more than 10 minutes.  7. Sleeping 5 =  Pain prevents me from sleeping at all.  8. Social Life 1 =  My social life is normal, but it increases my level of pain.  9. Traveling 0 =  I can travel anywhere without increased pain.  10. Employment/ Homemaking 5 = Pain prevents me from performing any job or homemaking chores.  Total 21/50 = 42%   Interpretation of scores: Score Category Description  0-20% Minimal Disability The patient can cope with most living activities. Usually no treatment is indicated apart from advice on lifting, sitting and exercise  21-40% Moderate Disability The patient experiences more pain and difficulty with sitting, lifting and standing. Travel and social life are more difficult and they may be disabled from work. Personal care, sexual activity and sleeping are not grossly affected, and the patient can usually be managed by conservative means  41-60% Severe Disability Pain remains the main problem in this group, but activities of daily living are affected. These patients require a detailed investigation  61-80% Crippled Back pain impinges on all aspects of the patient's life. Positive intervention is required  81-100% Bed-bound  These patients are  either bed-bound or exaggerating their symptoms  Bluford FORBES Zoe DELENA Karon DELENA, et al. Surgery versus conservative management of stable thoracolumbar fracture: the PRESTO feasibility RCT. [place] ([place]): [employer]; 2021 Nov. [facility] Technology Assessment, No. 25.62.) Appendix  3, Oswestry Disability Index category descriptors. Available from: [email or web address].cz  Minimally Clinically Important Difference (MCID) = 12.8%  COGNITION: Overall cognitive status: Within functional limits for tasks assessed     SENSATION: WFL  MUSCLE LENGTH: Hamstrings: Right 40 deg; Left 45 deg  POSTURE: rounded shoulders and forward head  PALPATION: Tightness noted to lumbar paraspinals (L>R), piriformis bilaterally TTP over spinous process of lumbar spine during CPAs  LUMBAR ROM:   AROM eval  Flexion Fingertips to mid shin   Extension 25%   Right lateral flexion WFL  Left lateral flexion WFL*  Right rotation 50%  Left rotation 50%*   (Blank rows = not tested)  LOWER EXTREMITY ROM:     WFL  LOWER EXTREMITY MMT:    MMT Right eval Left eval  Hip flexion 4+ 4+  Hip extension    Hip abduction 4+ 4+  Hip adduction 4+ 4+  Hip internal rotation    Hip external rotation    Knee flexion 4+ 4+  Knee extension 4+ 4+  Ankle dorsiflexion 4 4  Ankle plantarflexion    Ankle inversion    Ankle eversion     (Blank rows = not tested)  LUMBAR SPECIAL TESTS:  Straight leg raise test: Negative and FABER test: Negative  FUNCTIONAL TESTS:  5 times sit to stand: 21.85 seconds 6 minute walk test: to be completed visit #2  10 meter walk test: to be completed visit #2   GAIT: Distance walked: 53' Assistive device utilized: None Level of assistance: Complete Independence Comments: guarded gait, no other obvious gait deficits   TREATMENT DATE: 06/15/24                                                                                                                     Subjective: Patient reports no dizziness since the epley maneuver. Patient reports 2/10 NPS in the lower back. She reports some numbness and tingling in the femoral dermatome after sleeping in prolonged supine position. Patient reports improved sleeping duration (4 hours) before  having to transition into the recliner. No further questions or concerns.    Therapeutic Exercise:  Supine 90/90 with leg extensions  2 x 10 - Alternating LE   2 x 10 - with med ball press 3Kg   Supine Physioball UE/LE Squeeze for core activation  4 x 15s hold - Green physioball  Hooklying OH Med Ball Reach for core stabilization   1 x 10 - 3Kg MB   2 x 10 - BLE 3 off mat table  Passive Piriformis Stretch  R/L: 2 x 1 min ea leg   Therapeutic Activity (with intent to improve core stabilization with lifting, standing, walking):   Nustep level 6-2 x 6 minutes (seat 9) x UE/LE x > 80  SPM to improve cardiorespiratory endurance and improve trunk mobility.   Standing Pallof Press (Omega Cable machine)   R/L: 2 x 10 - 10#  Diagonal Chops (Omega Cable Machine)   Top Left to Bottom Right    2 x 10 - 5#   Seated Barbell Twist   3 x 20 - 25# barbell   PATIENT EDUCATION:  Education details: Exercise Technique   Person educated: Patient Education method: Explanation, Demonstration, and Handouts Education comprehension: verbalized understanding and returned demonstration  HOME EXERCISE PROGRAM: Access Code: [email or web address] URL: [email or web address] Date: 06/04/2024 Prepared by: Lonni Pall  Exercises - Seated Piriformis Stretch  - 2-3 x daily - 5-7 x weekly - 3-5 reps - 30-60 seconds hold - Seated Hamstring Stretch  - 2-3 x daily - 5-7 x weekly - 3-5 reps - 30-60 seconds hold - Supine Lower Trunk Rotation  - 2-3 x daily - 5-7 x weekly - 3 sets - 10 reps - Sidelying Thoracic Rotation with Open Book  - 2-3 x daily - 5-7 x weekly - 2 sets - 10 reps - Hooklying Clamshell with Resistance  - 1-2 x daily - 5-7 x weekly - 3 sets - 10 reps - Supine March with Resistance Band  - 1-2 x daily - 5-7 x weekly - 3 sets - 10 reps  Patient Education - Right Epley Maneuver  Access Code: [email or web address] URL: [email or web address] Date: 05/06/2024 Prepared by: Maryanne Finder  Exercises - Seated Piriformis Stretch  - 2-3 x daily - 5-7 x weekly - 3-5 reps - 30-60 seconds hold - Seated Hamstring Stretch  - 2-3 x daily - 5-7 x weekly - 3-5 reps - 30-60 seconds hold - Supine Lower Trunk Rotation  - 2-3 x daily - 5-7 x weekly - 3 sets - 10 reps - Sidelying Thoracic Rotation with Open Book  - 2-3 x daily - 5-7 x weekly - 2 sets - 10 reps - Hooklying Clamshell with Resistance  - 1-2 x daily - 5-7 x weekly - 3 sets - 10 reps - Supine March with Resistance Band  - 1-2 x daily - 5-7 x weekly - 3 sets - 10 reps  ASSESSMENT:  CLINICAL IMPRESSION:     Continued PT POC focused on management of LBP and dizziness. BPPV symptoms seemed to be completely resolved since Epley maneuver. PT continued to challenge core stability in various positions including a rotational movements. Good ability to tolerate all progressions to exercises. Improvement in lower back pain endorsed at the end of the session. Passive stretch techniques utilized in order to improve gluteal muscle tension. Patient educated on nerve roots that were suspectedly impinged while in prolonged supine. Patient will still benefit from skilled PT services in order to address listed impairments to improve quality of life and decrease pain.    OBJECTIVE IMPAIRMENTS: Abnormal gait, decreased activity tolerance, decreased endurance, decreased mobility, difficulty walking, decreased strength, impaired flexibility, postural dysfunction, and pain.   ACTIVITY LIMITATIONS: lifting, bending, standing, squatting, stairs, and locomotion level  PARTICIPATION LIMITATIONS: cleaning, shopping, community activity, and occupation  PERSONAL FACTORS: Age, Behavior pattern, Past/current experiences, and Time since onset of injury/illness/exacerbation are also affecting patient's functional outcome.   REHAB POTENTIAL: Good  CLINICAL DECISION MAKING: Stable/uncomplicated  EVALUATION COMPLEXITY: Moderate   GOALS: Goals reviewed  with patient? Yes  SHORT TERM GOALS: Target date: 05/27/2024  Patient will be independent in HEP to improve strength/mobility for better functional independence with ADLs.  Baseline: Goal status: INITIAL   LONG TERM GOALS:  Target date: 06/24/2024  Patient will reduce modified Oswestry score to <20 as to demonstrate minimal disability with ADLs including improved sleeping tolerance, walking/sitting tolerance etc for better mobility with ADLs. Baseline: 05/04/2024: 21/50 = 42%  Goal status: INITIAL  2.  Patient will report a worst pain of 3/10 on NRPS in low back to improve tolerance with ADLs and reduced symptoms with activities.  Baseline: 04/29/24: 10/10 Goal status: INITIAL  3.  Patient (< 10 years old) will complete five times sit to stand test in < 10 seconds indicating an increased LE strength and improved balance.  Baseline: 21.85 seconds  Goal status: INITIAL  4.  Patient will improve by 47m (164') in order to demonstrate clinically significant improvement in cardiopulmonary endurance and community ambulation  Baseline: 1242' with no AD  Goal status: INITIAL  5.  Patient will tolerate standing >30 minutes to demonstrate improved tolerance to activity to be able to complete iADLs at home.  Baseline: 04/29/24: unable to tolerate >10-15 minutes  Goal status: INITIAL   PLAN:  PT FREQUENCY: 1-2x/week  PT DURATION: 8 weeks  PLANNED INTERVENTIONS: 97164- PT Re-evaluation, 97750- Physical Performance Testing, 97110-Therapeutic exercises, 97530- Therapeutic activity, 97112- Neuromuscular re-education, 97535- Self Care, 02859- Manual therapy, [phone]- Gait training, [phone]- Electrical stimulation (unattended), [phone]- Electrical stimulation (manual), Patient/Family education, Balance training, Stair training, Joint mobilization, Joint manipulation, Spinal manipulation, Spinal mobilization, Cryotherapy, and Moist heat.  PLAN FOR NEXT SESSION: HEP review, core strengthening,  stretching   Lonni Pall PT, DPT Physical Therapist- [place]  [facility] [place] 06/15/2024, 11:16 AM

## 2024-06-17 ENCOUNTER — Ambulatory Visit (INDEPENDENT_AMBULATORY_CARE_PROVIDER_SITE_OTHER): Payer: Self-pay | Admitting: Nurse Practitioner

## 2024-06-17 ENCOUNTER — Encounter: Payer: Self-pay | Admitting: Nurse Practitioner

## 2024-06-17 VITALS — BP 138/80 | HR 65 | Temp 98.2°F | Ht 64.0 in | Wt 235.8 lb

## 2024-06-17 DIAGNOSIS — Z1211 Encounter for screening for malignant neoplasm of colon: Secondary | ICD-10-CM

## 2024-06-17 DIAGNOSIS — K219 Gastro-esophageal reflux disease without esophagitis: Secondary | ICD-10-CM | POA: Diagnosis not present

## 2024-06-17 DIAGNOSIS — Z1159 Encounter for screening for other viral diseases: Secondary | ICD-10-CM | POA: Diagnosis not present

## 2024-06-17 DIAGNOSIS — E669 Obesity, unspecified: Secondary | ICD-10-CM | POA: Diagnosis not present

## 2024-06-17 DIAGNOSIS — I10 Essential (primary) hypertension: Secondary | ICD-10-CM | POA: Diagnosis not present

## 2024-06-17 DIAGNOSIS — F32A Depression, unspecified: Secondary | ICD-10-CM

## 2024-06-17 DIAGNOSIS — I480 Paroxysmal atrial fibrillation: Secondary | ICD-10-CM

## 2024-06-17 DIAGNOSIS — F419 Anxiety disorder, unspecified: Secondary | ICD-10-CM

## 2024-06-17 DIAGNOSIS — Z87891 Personal history of nicotine dependence: Secondary | ICD-10-CM | POA: Diagnosis not present

## 2024-06-17 DIAGNOSIS — R7303 Prediabetes: Secondary | ICD-10-CM | POA: Diagnosis not present

## 2024-06-17 DIAGNOSIS — I5032 Chronic diastolic (congestive) heart failure: Secondary | ICD-10-CM

## 2024-06-17 DIAGNOSIS — Z6841 Body Mass Index (BMI) 40.0 and over, adult: Secondary | ICD-10-CM

## 2024-06-17 DIAGNOSIS — I77810 Thoracic aortic ectasia: Secondary | ICD-10-CM | POA: Diagnosis not present

## 2024-06-17 DIAGNOSIS — Z23 Encounter for immunization: Secondary | ICD-10-CM

## 2024-06-17 DIAGNOSIS — Z0000 Encounter for general adult medical examination without abnormal findings: Secondary | ICD-10-CM

## 2024-06-17 DIAGNOSIS — Z126 Encounter for screening for malignant neoplasm of bladder: Secondary | ICD-10-CM

## 2024-06-17 DIAGNOSIS — Z1231 Encounter for screening mammogram for malignant neoplasm of breast: Secondary | ICD-10-CM

## 2024-06-17 MED ORDER — TRAZODONE HCL 50 MG PO TABS: 25.0000 mg | ORAL_TABLET | Freq: Every evening | ORAL | 3 refills | Status: DC | PRN

## 2024-06-17 NOTE — Assessment & Plan Note (Signed)
History of the same.  Pending A1c today 

## 2024-06-17 NOTE — Assessment & Plan Note (Signed)
 Most recent CT scan done October 2025 showing 4.0 cm thoracic aneurysm.  Blood pressure controlled.  Continue monitoring every 12 months

## 2024-06-17 NOTE — Assessment & Plan Note (Signed)
 Currently maintained on Protonix  40 mg daily.  Continue

## 2024-06-17 NOTE — Assessment & Plan Note (Signed)
 Followed by cardiology currently maintained on metoprolol  100 mg daily.  Continue

## 2024-06-17 NOTE — Assessment & Plan Note (Signed)
 Pending urine microscopy rule out microscopic hematuria.  Patient is up-to-date on low-dose CT scan

## 2024-06-17 NOTE — Assessment & Plan Note (Signed)
 Patient currently maintained on mirtazapine  15 mg nightly and Abilify  5 mg daily.  Mirtazapine  is being ineffective.  Will continue Abilify  5 mg discontinue mirtazapine  and start trazodone 25 to 50 mg nightly

## 2024-06-17 NOTE — Assessment & Plan Note (Signed)
 Patient currently maintained on Eliquis  5 mg twice daily along with metoprolol  100 mg daily.  Patient is followed by cardiology.  Continue

## 2024-06-17 NOTE — Assessment & Plan Note (Signed)
 Currently maintained on metoprolol  100 mg daily.  Blood pressure controlled continue

## 2024-06-17 NOTE — Assessment & Plan Note (Signed)
 Discussed age-appropriate immunizations and screening exams.  Did review patient's personal, surgical, social, family histories.  Patient is up-to-date with all age-appropriate vaccinations she would like.  Update flu and Prevnar 20 today.  We did discuss shingles vaccine in office.  Ambulatory furl to GI for CRC screening.  Order placed for mammogram for breast cancer screening and patient given information to call and set up at her convenience.  Patient is up-to-date on cervical cancer screening.  Patient was given information at discharge about preventative healthcare maintenance with anticipatory guidance.

## 2024-06-17 NOTE — Progress Notes (Signed)
 Established Patient Office Visit  Subjective   Patient ID: Victoria Williamson, female    DOB: 14-Jul-1966  Age: 58 y.o. MRN: [id number]  Chief Complaint  Patient presents with   Annual Exam    Flu vaccine    HPI    A-fib: Patient currently maintained on Eliquis  5 mg twice daily, metoprolol  100 mg daily.  Patient is followed by cardiology.  GERD: Patient currently maintained on Protonix  40 mg daily  Mood disorder: Currently maintained on Abilify  5 mg daily and mirtazapine  15 mg nightly  for complete physical and follow up of chronic conditions.  Immunizations: -Tetanus: Completed in 2023 -Influenza: up date today  -Shingles:discussed in office today  -Pneumonia: update today  Diet: Fair diet. She is eating 1-2 meals and some snacks. She is doing water  Exercise: No regular exercise. She has been walking more. She is going to PT twice a week   Eye exam: Completes annually. glasses  Dental exam: Completes semi-annually    Colonoscopy: Completed in 08/12/2018, repeat 5 years  Lung Cancer Screening: Completed in 05/29/2024.  Recommend follow-up in 12 months.  Patient has stable dilated 4.0 cm ascending thoracic aortic aneurysm.   Pap smear: 04/16/2022 negative cells HPV.  Patient due between 2026-2028.  Followed by GYN  Mammogram: 07/17/2023, order placed for [facility]    DEXA: Too young  Sleep: going to bed around 9 and will get up 11p. Statse that her back will wake her up. She will lay in her chair and she will doze off and get broken sleep.      Review of Systems  Constitutional:  Negative for chills and fever.  Respiratory:  Negative for shortness of breath.   Cardiovascular:  Negative for chest pain and leg swelling.  Gastrointestinal:  Negative for abdominal pain, blood in stool, constipation, diarrhea, nausea and vomiting.       BM daily   Genitourinary:  Negative for dysuria and hematuria.  Neurological:  Negative for dizziness, tingling and  headaches.  Psychiatric/Behavioral:  Negative for hallucinations and suicidal ideas.       Objective:     BP 138/80   Pulse 65   Temp 98.2 F (36.8 C) (Oral)   Ht 5' 4 (1.626 m)   Wt 235 lb 12.8 oz (107 kg)   SpO2 95%   BMI 40.47 kg/m  BP Readings from Last 3 Encounters:  06/17/24 138/80  06/01/24 120/70  04/09/24 130/72   Wt Readings from Last 3 Encounters:  06/17/24 235 lb 12.8 oz (107 kg)  06/01/24 236 lb 9.6 oz (107.3 kg)  04/09/24 246 lb 4 oz (111.7 kg)   SpO2 Readings from Last 3 Encounters:  06/17/24 95%  06/01/24 98%  04/09/24 97%      Physical Exam   No results found for any visits on 06/17/24.    The 10-year ASCVD risk score (Arnett DK, et al., 2019) is: 3.9%    Assessment & Plan:   Problem List Items Addressed This Visit       Cardiovascular and Mediastinum   Chronic heart failure with preserved ejection fraction (HCC)   Followed by cardiology currently maintained on metoprolol  100 mg daily.  Continue      Relevant Orders   Comprehensive metabolic panel with GFR   CBC with Differential/Platelet   TSH   Essential hypertension   Currently maintained on metoprolol  100 mg daily.  Blood pressure controlled continue      Relevant Orders   Comprehensive  metabolic panel with GFR   CBC with Differential/Platelet   TSH   Lipid panel   Paroxysmal atrial fibrillation (HCC)   Patient currently maintained on Eliquis  5 mg twice daily along with metoprolol  100 mg daily.  Patient is followed by cardiology.  Continue      Relevant Orders   Comprehensive metabolic panel with GFR   CBC with Differential/Platelet   TSH   Ascending aorta dilatation   Most recent CT scan done October 2025 showing 4.0 cm thoracic aneurysm.  Blood pressure controlled.  Continue monitoring every 12 months        Digestive   Gastroesophageal reflux disease without esophagitis   Currently maintained on Protonix  40 mg daily.  Continue        Other   Anxiety and  depression   Patient currently maintained on mirtazapine  15 mg nightly and Abilify  5 mg daily.  Mirtazapine  is being ineffective.  Will continue Abilify  5 mg discontinue mirtazapine  and start trazodone 25 to 50 mg nightly      Relevant Medications   traZODone (DESYREL) 50 MG tablet   Obesity (BMI 30-39.9)   History of the same.  Patient is working on healthy lifestyle modifications inclusive of exercise.  Pending TSH, A1c, lipid panel.      Relevant Orders   Hemoglobin A1c   TSH   Lipid panel   Preventative health care - Primary   Discussed age-appropriate immunizations and screening exams.  Did review patient's personal, surgical, social, family histories.  Patient is up-to-date with all age-appropriate vaccinations she would like.  Update flu and Prevnar 20 today.  We did discuss shingles vaccine in office.  Ambulatory furl to GI for CRC screening.  Order placed for mammogram for breast cancer screening and patient given information to call and set up at her convenience.  Patient is up-to-date on cervical cancer screening.  Patient was given information at discharge about preventative healthcare maintenance with anticipatory guidance.      Former smoker   Pending urine microscopy rule out microscopic hematuria.  Patient is up-to-date on low-dose CT scan      Relevant Orders   Urine Microscopic   Prediabetes   History of the same.  Pending A1c today      Relevant Orders   Hemoglobin A1c   Other Visit Diagnoses       Encounter for hepatitis C screening test for low risk patient       Relevant Orders   Hepatitis C antibody     Screening for bladder cancer       Relevant Orders   Urine Microscopic     Need for pneumococcal 20-valent conjugate vaccination       Relevant Orders   Pneumococcal conjugate vaccine 20-valent (Prevnar 20) (Completed)     Screening mammogram for breast cancer       Relevant Orders   MM 3D SCREENING MAMMOGRAM BILATERAL BREAST     Screening for colon  cancer       Relevant Orders   Ambulatory referral to Gastroenterology     Need for influenza vaccination       Relevant Orders   Flu vaccine trivalent PF, 6mos and older(Flulaval,Afluria,Fluarix,Fluzone) (Completed)       Return in about 1 year (around 06/17/2025) for CPE and Labs.    Adina Crandall, NP

## 2024-06-17 NOTE — Patient Instructions (Addendum)
 Nice to see you today  I will be in touch with the labs once I have them  Start taking half of the mirtazapine  (7.5MG ) nightly through Friday. Then stop and start the trazodone Follow up with me in 1 year, sooner if you need me   Call and schedule your mammogram at   [facility] [address] ( on hospital grounds) [place], [place]  [phone]

## 2024-06-17 NOTE — Assessment & Plan Note (Signed)
 History of the same.  Patient is working on healthy lifestyle modifications inclusive of exercise.  Pending TSH, A1c, lipid panel.

## 2024-06-18 ENCOUNTER — Telehealth: Payer: Self-pay

## 2024-06-18 ENCOUNTER — Ambulatory Visit

## 2024-06-18 ENCOUNTER — Encounter: Payer: Self-pay | Admitting: Nurse Practitioner

## 2024-06-18 ENCOUNTER — Other Ambulatory Visit: Payer: Self-pay

## 2024-06-18 DIAGNOSIS — L989 Disorder of the skin and subcutaneous tissue, unspecified: Secondary | ICD-10-CM

## 2024-06-18 DIAGNOSIS — Z860100 Personal history of colon polyps, unspecified: Secondary | ICD-10-CM

## 2024-06-18 LAB — CBC WITH DIFFERENTIAL/PLATELET
Basophils Absolute: 0.1 K/uL (ref 0.0–0.1)
Basophils Relative: 1.1 % (ref 0.0–3.0)
Eosinophils Absolute: 0.4 K/uL (ref 0.0–0.7)
Eosinophils Relative: 5.4 % — ABNORMAL HIGH (ref 0.0–5.0)
HCT: 41 % (ref 36.0–46.0)
Hemoglobin: 13.8 g/dL (ref 12.0–15.0)
Lymphocytes Relative: 32 % (ref 12.0–46.0)
Lymphs Abs: 2.4 K/uL (ref 0.7–4.0)
MCHC: 33.6 g/dL (ref 30.0–36.0)
MCV: 89.9 fl (ref 78.0–100.0)
Monocytes Absolute: 0.9 K/uL (ref 0.1–1.0)
Monocytes Relative: 12 % (ref 3.0–12.0)
Neutro Abs: 3.8 K/uL (ref 1.4–7.7)
Neutrophils Relative %: 49.5 % (ref 43.0–77.0)
Platelets: 211 K/uL (ref 150.0–400.0)
RBC: 4.56 Mil/uL (ref 3.87–5.11)
RDW: 13.6 % (ref 11.5–15.5)
WBC: 7.6 K/uL (ref 4.0–10.5)

## 2024-06-18 LAB — HEMOGLOBIN A1C: Hgb A1c MFr Bld: 5.5 % (ref 4.6–6.5)

## 2024-06-18 LAB — URINALYSIS, MICROSCOPIC ONLY

## 2024-06-18 LAB — COMPREHENSIVE METABOLIC PANEL WITH GFR
ALT: 44 U/L — ABNORMAL HIGH (ref 0–35)
AST: 29 U/L (ref 0–37)
Albumin: 4.3 g/dL (ref 3.5–5.2)
Alkaline Phosphatase: 84 U/L (ref 39–117)
BUN: 10 mg/dL (ref 6–23)
CO2: 30 meq/L (ref 19–32)
Calcium: 9.6 mg/dL (ref 8.4–10.5)
Chloride: 103 meq/L (ref 96–112)
Creatinine, Ser: 0.91 mg/dL (ref 0.40–1.20)
GFR: 70.15 mL/min (ref >60.00)
Glucose, Bld: 87 mg/dL (ref 70–99)
Potassium: 4.1 meq/L (ref 3.5–5.1)
Sodium: 140 meq/L (ref 135–145)
Total Bilirubin: 0.7 mg/dL (ref 0.2–1.2)
Total Protein: 7.2 g/dL (ref 6.0–8.3)

## 2024-06-18 LAB — LIPID PANEL
Cholesterol: 162 mg/dL (ref 0–200)
HDL: 42.1 mg/dL (ref >39.00)
LDL Cholesterol: 102 mg/dL — ABNORMAL HIGH (ref 0–99)
NonHDL: 120.09
Total CHOL/HDL Ratio: 4
Triglycerides: 91 mg/dL (ref 0.0–149.0)
VLDL: 18.2 mg/dL (ref 0.0–40.0)

## 2024-06-18 LAB — HEPATITIS C ANTIBODY: Hepatitis C Ab: NONREACTIVE

## 2024-06-18 LAB — TSH: TSH: 2.06 u[IU]/mL (ref 0.35–5.50)

## 2024-06-18 MED ORDER — NA SULFATE-K SULFATE-MG SULF 17.5-3.13-1.6 GM/177ML PO SOLN: 1.0000 | Freq: Once | ORAL | 0 refills | Status: AC

## 2024-06-18 NOTE — Telephone Encounter (Signed)
 Gastroenterology Pre-Procedure Review  Request Date: 08/27/24 Requesting Physician: Dr. Jinny  PATIENT REVIEW QUESTIONS: The patient responded to the following health history questions as indicated:    1. Are you having any GI issues? no 2. Do you have a personal history of Polyps? yes (last colonoscopy performed at 07/21/18 by Dr. Geryl) 3. Do you have a family history of Colon Cancer or Polyps? no 4. Diabetes Mellitus? no 5. Joint replacements in the past 12 months?no 6. Major health problems in the past 3 months?no 7. Any artificial heart valves, MVP, or defibrillator?no    MEDICATIONS & ALLERGIES:    Patient reports the following regarding taking any anticoagulation/antiplatelet therapy:   Plavix, Coumadin, Eliquis , Xarelto, Lovenox, Pradaxa, Brilinta, or Effient? no Aspirin? no  Patient confirms/reports the following medications:  Current Outpatient Medications  Medication Sig Dispense Refill   apixaban  (ELIQUIS ) 5 MG TABS tablet Take 1 tablet (5 mg total) by mouth 2 (two) times daily. 180 tablet 3   ARIPiprazole  (ABILIFY ) 5 MG tablet Take 5 mg by mouth daily.     clobetasol  ointment (TEMOVATE ) 0.05 % Apply to affected area every night for 4 weeks, then every other day for 4 weeks and then twice a week for 4 weeks or until resolution. 30 g 5   fluticasone  (FLONASE ) 50 MCG/ACT nasal spray Place 1 spray into both nostrils daily as needed for allergies.     loratadine  (CLARITIN ) 10 MG tablet Take 10 mg by mouth daily as needed for allergies.     metoprolol  succinate (TOPROL -XL) 100 MG 24 hr tablet Take 1 tablet (100 mg total) by mouth daily. Take with or immediately following a meal. 30 tablet 3   pantoprazole  (PROTONIX ) 40 MG tablet Take 1 tablet (40 mg total) by mouth daily. 90 tablet 1   traZODone (DESYREL) 50 MG tablet Take 0.5-1 tablets (25-50 mg total) by mouth at bedtime as needed for sleep. 30 tablet 3   Current Facility-Administered Medications  Medication Dose Route  Frequency Provider Last Rate Last Admin   tirzepatide (MOUNJARO) Pen 2.5 mg  2.5 mg Subcutaneous Once Agbor-Etang, Brian, MD       tirzepatide (MOUNJARO) Pen 5 mg  5 mg Subcutaneous Once Agbor-Etang, Brian, MD        Patient confirms/reports the following allergies:  Allergies  Allergen Reactions   Codeine Nausea And Vomiting    Confusion and could not stay awake   Meloxicam Nausea Only    And stomach cramping   Mushroom Diarrhea   Paroxetine Hcl Other (See Comments)    Sleep all the time    No orders of the defined types were placed in this encounter.   AUTHORIZATION INFORMATION Primary Insurance: 1D#: Group #:  Secondary Insurance: 1D#: Group #:  SCHEDULE INFORMATION: Date: 08/27/24 Time: Location: [facility]

## 2024-06-19 ENCOUNTER — Telehealth (HOSPITAL_BASED_OUTPATIENT_CLINIC_OR_DEPARTMENT_OTHER): Payer: Self-pay | Admitting: *Deleted

## 2024-06-19 NOTE — Telephone Encounter (Signed)
General Anesthesia  

## 2024-06-19 NOTE — Telephone Encounter (Signed)
 Pharmacy, can you please provide recommendations for holding Eliquis  for upcoming procedure? Thank you!  Of note for pre-op team, she will then need a virtual visit in 07/2024 or early 08/2024.

## 2024-06-19 NOTE — Telephone Encounter (Addendum)
   Pre-operative Risk Assessment    Patient Name: Victoria Williamson  DOB: 12/10/1965 MRN: [id number]   Date of last office visit: 04/09/2024 Date of next office visit: 10/08/2024  Request for Surgical Clearance    Procedure:  Colonoscopy  Date of Surgery:  Clearance 08/27/24                                 Surgeon:  Dr. Rogelia Copping Surgeon's Group or Practice Name:  [facility] Phone number:  [phone] Fax number:  [phone]   Type of Clearance Requested:   - Medical  - Pharmacy:  Hold Apixaban  (Eliquis ) Not indicated.   Type of Anesthesia:  General   Additional requests/questions:    Signed, Edsel Grayce Sanders   06/19/2024, 8:18 AM

## 2024-06-20 ENCOUNTER — Other Ambulatory Visit
Admission: RE | Admit: 2024-06-20 | Discharge: 2024-06-20 | Disposition: A | Discharge status: Home / Self Care | Payer: Self-pay | Source: Ambulatory Visit | Attending: Medical Genetics | Admitting: Medical Genetics

## 2024-06-21 NOTE — Therapy (Incomplete)
 OUTPATIENT PHYSICAL THERAPY THORACOLUMBAR TREATMENT/PROGRESS NOTE  Dates of reporting period  04/29/2024   to   06/22/2024   Patient Name: Victoria Williamson MRN: [id number] DOB:1965/09/24, 58 y.o., female Today's Date: 06/22/2024  END OF SESSION:  PT End of Session - 06/22/24 1303     Visit Number 10    Number of Visits 17    Date for Recertification  06/24/24    Authorization Type HB auth# [id number] for 13 PT vsts from 9/24-12/23    Authorization - Number of Visits 13    Progress Note Due on Visit 10    PT Start Time 1301    PT Stop Time 1340    PT Time Calculation (min) 39 min    Activity Tolerance Patient tolerated treatment well    Behavior During Therapy [facility] for tasks assessed/performed            Past Medical History:  Diagnosis Date   Anticoagulant long-term use    eliquis --- managed by cardiology   Arthritis    Ascending aortic aneurysm 05/2022   had surgical consult 05-21-2022 w/ wayne gold PA ,   4.0 cm per CT 05-21-2022 in epic, active survillance   Chronic low back pain    DDD (degenerative disc disease), lumbar    Diastolic CHF, chronic (HCC)    followed by cardiology;    last echo 07-27-2021 ef 60-65%   DOE (dyspnea on exertion)    Dysautonomia (HCC) 12/18/2013   dx by cardiologist--- dr fernande   [phone]  pt stated no issues in passed few yrs)   Edema of both lower extremities    intermittant per pt   Fatty liver    GAD (generalized anxiety disorder)    History of adenomatous polyp of colon    Hx of skin cancer, basal cell    Hypertension    MDD (major depressive disorder)    PAF (paroxysmal atrial fibrillation) (HCC) 09/2019   cardiologist-  dr b. agbor-etang/  EP cardiologist-- dr cindie   PMB (postmenopausal bleeding)    PONV (postoperative nausea and vomiting)    Pre-diabetes    PSVT (paroxysmal supraventricular tachycardia)    Pulmonary emphysema (HCC)    followed by pcp   Pulmonary nodule    followed by pcp   Spondylolisthesis of  lumbar region    Past Surgical History:  Procedure Laterality Date   ATRIAL FIBRILLATION ABLATION N/A 07/15/2020   Procedure: ATRIAL FIBRILLATION ABLATION;  Surgeon: Cindie Ole DASEN, MD;  Location: [facility];  Service: Cardiovascular;  Laterality: N/A;   ATRIAL FIBRILLATION ABLATION N/A 11/29/2023   Procedure: ATRIAL FIBRILLATION ABLATION;  Surgeon: Cindie Ole DASEN, MD;  Location: [facility];  Service: Cardiovascular;  Laterality: N/A;   BREAST BIOPSY Right 07/17/2023   MM RT BREAST BX W LOC DEV 1ST LESION IMAGE BX SPEC STEREO GUIDE 07/17/2023 [facility]   CARDIAC CATHETERIZATION  06/03/2013   @[facility]  by dr bosie;  per pt was told normal coronary arteries   CARDIOVERSION N/A 05/11/2020   Procedure: CARDIOVERSION;  Surgeon: Darliss Rogue, MD;  Location: [facility];  Service: Cardiovascular;  Laterality: N/A;   CARPAL TUNNEL RELEASE Left 06/23/2020   Procedure: CARPAL TUNNEL RELEASE;  Surgeon: Murrell Kuba, MD;  Location: [facility];  Service: Orthopedics;  Laterality: Left;  IV REGIONAL FOREARM BLOCK   CARPAL TUNNEL RELEASE Right 2000   COLONOSCOPY  2020   HYSTEROSCOPY W/ ENDOMETRIAL ABLATION  02/23/2005   @[facility] ;  w/ novasure  HYSTEROSCOPY WITH D & C N/A 07/24/2022   Procedure: ATTEMPTED DILATATION AND CURETTAGE /HYSTEROSCOPY;  Surgeon: Cleotilde Ronal RAMAN, MD;  Location: [facility] [place];  Service: Gynecology;  Laterality: N/A;   OPERATIVE ULTRASOUND N/A 07/24/2022   Procedure: OPERATIVE ULTRASOUND;  Surgeon: Cleotilde Ronal RAMAN, MD;  Location: [facility];  Service: Gynecology;  Laterality: N/A;   REVISION AMPUTATION OF FINGER Left 12/17/2000   @[facility]  by dr sypher;  injury  left little finger   TUBAL LIGATION Bilateral    yrs ago   Patient Active Problem List   Diagnosis Date Noted   Atrial flutter (HCC) 02/27/2024   Atrial fibrillation (HCC) 11/29/2023   Flutter-fibrillation (HCC) 11/21/2023   Palpitations 11/21/2023    Hypercoagulable state due to paroxysmal atrial fibrillation (HCC) 12/22/2022   History of endometrial ablation 08/07/2022   Postmenopausal bleeding 08/07/2022   Cervical stenosis (uterine cervix) 05/29/2022   Vulvar irritation 05/29/2022   Endometrial mass 05/29/2022   Ascending aorta dilatation 05/29/2022   Paraspinal mass 05/29/2022   Prediabetes 05/18/2022   Other fatigue 05/18/2022   Shortness of breath 05/18/2022   Pulmonary emphysema (HCC) 05/18/2022   Esophageal dysphagia 05/18/2022   Right conjunctivitis 04/23/2022   Hematuria 12/01/2021   Abnormal uterine bleeding (AUB) 12/01/2021   Former smoker 10/20/2021   Chronic bilateral low back pain without sciatica 05/15/2021   COVID-19 04/25/2021   Obesity (BMI 30-39.9) 03/17/2021   Bipolar depression (HCC) 03/17/2021   Preventative health care 03/17/2021   Vitamin D deficiency 03/17/2021   Skin lesion 03/17/2021   SVT (supraventricular tachycardia) 11/23/2020   Arthritis    Carpal tunnel syndrome of left wrist 02/04/2020   Chronic heart failure with preserved ejection fraction (HCC) 01/11/2020   Hepatic steatosis 12/18/2019   High serum thyroid  stimulating hormone (TSH) 12/18/2019   Lower extremity edema 12/17/2019   Neck swelling 12/17/2019   Paroxysmal atrial fibrillation (HCC) 12/17/2019   Lumbar radiculitis 01/16/2019   Tubular adenoma 08/22/2018   B12 deficiency 01/14/2018   Essential hypertension 05/19/2014   Gastroesophageal reflux disease without esophagitis 05/19/2014   Vasovagal syndrome 12/25/2013   Anxiety and depression 12/20/2013   Dysautonomia (HCC) 12/18/2013    PCP: [name], NP  REFERRING PROVIDER: [name], NP  REFERRING DIAG:  G89.29,M54.41 (ICD-10-CM) - Chronic bilateral low back pain with right-sided sciatica  Rationale for Evaluation and Treatment: Rehabilitation  THERAPY DIAG:  Other low back pain  Muscle weakness (generalized)  ONSET DATE: chronic  SUBJECTIVE:  SUBJECTIVE STATEMENT: Patient reports she is coming to therapy for her back pain and her MRI showed pinched nerve on the L side. Intermittent pain down her L leg and to R knee. Patient reports she had early retirement due to not being able to complete her job duties (heavy lifting, walking, squatting, etc.)   PERTINENT HISTORY:  Per MD note on 04/09/24, She has a history of chronic back pain, described as a dull aching pain at the waistline, exacerbated by movement and persistent throughout the day. The pain occasionally radiates down her right leg to the knee and has caused numbness in the left leg in the past. She reports leg weakness, particularly when walking, and attributes some of this to her weight.   PAIN:  Are you having pain? Yes: NPRS scale: 1-2/10; worst 10/10  Pain location: low back  Pain description: dull, ache, constant Aggravating factors: walking, lifting, standing >10-15 minutes  Relieving factors: sitting  PRECAUTIONS: None  RED FLAGS: None   WEIGHT BEARING RESTRICTIONS: No  FALLS:  Has patient fallen in last 6 months? No  LIVING ENVIRONMENT: Lives with: lives with their spouse Lives in: House/apartment Stairs: Yes: External: 4 steps; can reach both Has following equipment at home: None  OCCUPATION: retired   PLOF: Independent  PATIENT GOALS: to relieve some of the pain and be more mobile   OBJECTIVE:  Note: Objective measures were completed at Evaluation unless otherwise noted.  DIAGNOSTIC FINDINGS:  MRI SPINE LUMBAR WO CONTRAST Collected on Feb 26, 2024 9:45 AM  IMPRESSION: Mild degenerative changes of the lumbar spine.  PATIENT SURVEYS:  Modified Oswestry:  MODIFIED OSWESTRY DISABILITY SCALE  Date: 04/29/24 Score  Pain intensity 1 = The pain is bad, but I can manage  without having to take (1) I can stand as long as I want but, it increases my pain. pain medication.  2. Personal care (washing, dressing, etc.) 0 =  I can take care of myself normally without causing increased pain.  3. Lifting 2 = Pain prevents me from lifting heavy weights off the floor, activities (eg. sports, dancing). but I can manage if the weights are conveniently positioned (3) Pain prevents me from going out very often. (eg, on a table).  4. Walking 3 =  Pain prevents me from walking more than  mile.  5. Sitting 0 =  I can sit in any chair as long as I like.  6. Standing 4 =  Pain prevents me from standing more than 10 minutes.  7. Sleeping 5 =  Pain prevents me from sleeping at all.  8. Social Life 1 =  My social life is normal, but it increases my level of pain.  9. Traveling 0 =  I can travel anywhere without increased pain.  10. Employment/ Homemaking 5 = Pain prevents me from performing any job or homemaking chores.  Total 21/50 = 42%   Interpretation of scores: Score Category Description  0-20% Minimal Disability The patient can cope with most living activities. Usually no treatment is indicated apart from advice on lifting, sitting and exercise  21-40% Moderate Disability The patient experiences more pain and difficulty with sitting, lifting and standing. Travel and social life are more difficult and they may be disabled from work. Personal care, sexual activity and sleeping are not grossly affected, and the patient can usually be managed by conservative means  41-60% Severe Disability Pain remains the main problem in this group, but activities of daily living are affected. These patients  require a detailed investigation  61-80% Crippled Back pain impinges on all aspects of the patient's life. Positive intervention is required  81-100% Bed-bound  These patients are either bed-bound or exaggerating their symptoms  Bluford FORBES Zoe DELENA Karon DELENA, et al. Surgery versus conservative  management of stable thoracolumbar fracture: the PRESTO feasibility RCT. [place] ([place]): [employer]; 2021 Nov. [facility] Technology Assessment, No. 25.62.) Appendix 3, Oswestry Disability Index category descriptors. Available from: Findjewelers.cz  Minimally Clinically Important Difference (MCID) = 12.8%  COGNITION: Overall cognitive status: Within functional limits for tasks assessed     SENSATION: WFL  MUSCLE LENGTH: Hamstrings: Right 40 deg; Left 45 deg  POSTURE: rounded shoulders and forward head  PALPATION: Tightness noted to lumbar paraspinals (L>R), piriformis bilaterally TTP over spinous process of lumbar spine during CPAs  LUMBAR ROM:   AROM eval  Flexion Fingertips to mid shin   Extension 25%   Right lateral flexion WFL  Left lateral flexion WFL*  Right rotation 50%  Left rotation 50%*   (Blank rows = not tested)  LOWER EXTREMITY ROM:     WFL  LOWER EXTREMITY MMT:    MMT Right eval Left eval  Hip flexion 4+ 4+  Hip extension    Hip abduction 4+ 4+  Hip adduction 4+ 4+  Hip internal rotation    Hip external rotation    Knee flexion 4+ 4+  Knee extension 4+ 4+  Ankle dorsiflexion 4 4  Ankle plantarflexion    Ankle inversion    Ankle eversion     (Blank rows = not tested)  LUMBAR SPECIAL TESTS:  Straight leg raise test: Negative and FABER test: Negative  FUNCTIONAL TESTS:  5 times sit to stand: 21.85 seconds 6 minute walk test: to be completed visit #2  10 meter walk test: to be completed visit #2   GAIT: Distance walked: 77' Assistive device utilized: None Level of assistance: Complete Independence Comments: guarded gait, no other obvious gait deficits   TREATMENT DATE: 06/22/24                                                                                                                     Subjective: Patient reports minor lower back pain following increased walking around [facility] .  Current pain in her lower back rated at 4/10. No signs or symptoms related to BPPV since Epley maneuver was performed. No further questions or concerns.   Physical Performance Measures:   5TSTS: 12.00s  - (Age related norms 50-59 years: 7.7  2.6 seconds )   The Five Times Sit to Stand Test measures one aspect of transfer skill. The test provides a method to quantify functional lower extremity strength and/or identify movement strategies a patient uses to complete transitional movements.   : 1455 ft This is a 1-item objective measure designed to assess submaximal aerobic/functional walking capacity, community walking prediction, and serve as a predictor of morbidity and mortality in cardiac patients.  Time spent on PT education regarding  explanation of the test, set up, and result of patient performance.   Therapeutic Exercise:  Dead Bug Progression:  Supine 90-90 with alternating LE ext   3 x 10     Supine 90-90 with alternating UE OH reach    3 x 10      Dead bug - Alternating UE/LE    3 x 10   Seated Russian Twist - LE unsupported   3 x 20 - 3 Kg med ball   Therapeutic Activity (with intent to improve core stabilization with lifting, standing, walking):   Nustep level 6-2 x 6 minutes (seat 9) x UE/LE x > 80 SPM to improve cardiorespiratory endurance and improve trunk mobility.   Seated Barbell Twist   3 x 16 - 45# barbell    PATIENT EDUCATION:  Education details: Exercise Technique   Person educated: Patient Education method: Explanation, Demonstration, and Handouts Education comprehension: verbalized understanding and returned demonstration  HOME EXERCISE PROGRAM: Access Code: [email or web address] URL: [email or web address] Date: 06/22/2024 Prepared by: Lonni Markell Schrier  Exercises - Seated Piriformis Stretch  - 2-3 x daily - 3-4 x weekly - 3-5 reps - 30-60 seconds hold - Seated Hamstring Stretch  - 2-3 x daily - 3-4 x weekly - 3-5 reps - 30-60 seconds  hold - Supine Lower Trunk Rotation  - 2-3 x daily - 3-4 x weekly - 3 sets - 10 reps - Sidelying Thoracic Rotation with Open Book  - 2-3 x daily - 3-4 x weekly - 2 sets - 10 reps - Hooklying Clamshell with Resistance  - 1-2 x daily - 3-4 x weekly - 3 sets - 10 reps - Supine March with Resistance Band  - 1-2 x daily - 3-4 x weekly - 3 sets - 10 reps - Supine 90/90 with Leg Extensions  - 1 x daily - 3-4 x weekly - 2-3 sets - 10 reps - Supine Dead Bug with Leg Extension  - 1 x daily - 3-4 x weekly - 2-3 sets - 10 reps  Patient Education - Right Epley Maneuver  Access Code: [email or web address] URL: [email or web address] Date: 06/04/2024 Prepared by: Lonni Pall  Exercises - Seated Piriformis Stretch  - 2-3 x daily - 5-7 x weekly - 3-5 reps - 30-60 seconds hold - Seated Hamstring Stretch  - 2-3 x daily - 5-7 x weekly - 3-5 reps - 30-60 seconds hold - Supine Lower Trunk Rotation  - 2-3 x daily - 5-7 x weekly - 3 sets - 10 reps - Sidelying Thoracic Rotation with Open Book  - 2-3 x daily - 5-7 x weekly - 2 sets - 10 reps - Hooklying Clamshell with Resistance  - 1-2 x daily - 5-7 x weekly - 3 sets - 10 reps - Supine March with Resistance Band  - 1-2 x daily - 5-7 x weekly - 3 sets - 10 reps  Patient Education - Right Epley Maneuver  Access Code: [email or web address] URL: [email or web address] Date: 05/06/2024 Prepared by: Maryanne Finder  Exercises - Seated Piriformis Stretch  - 2-3 x daily - 5-7 x weekly - 3-5 reps - 30-60 seconds hold - Seated Hamstring Stretch  - 2-3 x daily - 5-7 x weekly - 3-5 reps - 30-60 seconds hold - Supine Lower Trunk Rotation  - 2-3 x daily - 5-7 x weekly - 3 sets - 10 reps - Sidelying Thoracic Rotation with Open Book  - 2-3 x daily - 5-7 x weekly - 2 sets - 10 reps - Hooklying Clamshell with Resistance  - 1-2  x daily - 5-7 x weekly - 3 sets - 10 reps - Supine March with Resistance Band  - 1-2 x daily - 5-7 x weekly - 3 sets - 10  reps  ASSESSMENT:  CLINICAL IMPRESSION:     Patient arrived to OPPT for her 10th visit warranting progress report towards PT goals. Patient demonstrated improvements with LE endurance and strength as indicated with and 5TSTS (See below). Patient has also reported minimal improvements in overall daily function and  worst pain as indicated with NPS and mODI (see below). Remainder of session focused on continued core strengthening and lumbar mobility. Patient agreeable to d/c within the next couple appointments due to her progress. PT encouraged increased adherence in order to maintain progress. Patient will still benefit from skilled PT services in order to address listed impairments to improve quality of life and decrease pain.  OBJECTIVE IMPAIRMENTS: Abnormal gait, decreased activity tolerance, decreased endurance, decreased mobility, difficulty walking, decreased strength, impaired flexibility, postural dysfunction, and pain.   ACTIVITY LIMITATIONS: lifting, bending, standing, squatting, stairs, and locomotion level  PARTICIPATION LIMITATIONS: cleaning, shopping, community activity, and occupation  PERSONAL FACTORS: Age, Behavior pattern, Past/current experiences, and Time since onset of injury/illness/exacerbation are also affecting patient's functional outcome.   REHAB POTENTIAL: Good  CLINICAL DECISION MAKING: Stable/uncomplicated  EVALUATION COMPLEXITY: Moderate   GOALS: Goals reviewed with patient? Yes  SHORT TERM GOALS: Target date: 05/27/2024  Patient will be independent in HEP to improve strength/mobility for better functional independence with ADLs.  Baseline: 50% adherent  Goal status: Progressing   LONG TERM GOALS: Target date: 06/24/2024  Patient will reduce modified Oswestry score to <20 as to demonstrate minimal disability with ADLs including improved sleeping tolerance, walking/sitting tolerance etc for better mobility with ADLs. Baseline: 05/04/2024: 21/50 =  42%; 06/22/2024: 18/50 = 36%  Goal status: Progressing  2.  Patient will report a worst pain of 3/10 on NRPS in low back to improve tolerance with ADLs and reduced symptoms with activities.  Baseline: 04/29/24: 10/10; 06/22/2024: 7/10 Goal status: Progressing   3.  Patient (< 19 years old) will complete five times sit to stand test in < 10 seconds indicating an increased LE strength and improved balance.  Baseline: 21.85 seconds; 06/22/2024: 12.00s Goal status: Progressing   4.  Patient will improve by 36m (164') in order to demonstrate clinically significant improvement in cardiopulmonary endurance and community ambulation  Baseline: 1242' with no AD; 06/22/2024: 1455'   Goal status: Goal Met  5.  Patient will tolerate standing >30 minutes to demonstrate improved tolerance to activity to be able to complete iADLs at home.  Baseline: 04/29/24: unable to tolerate >10-15 minutes; 06/22/2024: 15-20 min  Goal status: Progressing    PLAN:  PT FREQUENCY: 1-2x/week  PT DURATION: 8 weeks  PLANNED INTERVENTIONS: 97164- PT Re-evaluation, 97750- Physical Performance Testing, 97110-Therapeutic exercises, 97530- Therapeutic activity, 97112- Neuromuscular re-education, 97535- Self Care, [id number]- Manual therapy, [phone]- Gait training, [phone]- Electrical stimulation (unattended), [phone]- Electrical stimulation (manual), Patient/Family education, Balance training, Stair training, Joint mobilization, Joint manipulation, Spinal manipulation, Spinal mobilization, Cryotherapy, and Moist heat.  PLAN FOR NEXT SESSION: HEP review, core strengthening, stretching   Lonni Pall PT, DPT Physical Therapist- [place]  [facility] 06/22/2024, 1:05 PM

## 2024-06-22 ENCOUNTER — Ambulatory Visit: Payer: Self-pay | Admitting: Nurse Practitioner

## 2024-06-22 ENCOUNTER — Ambulatory Visit

## 2024-06-22 DIAGNOSIS — M6281 Muscle weakness (generalized): Secondary | ICD-10-CM

## 2024-06-22 DIAGNOSIS — M5459 Other low back pain: Secondary | ICD-10-CM | POA: Diagnosis not present

## 2024-06-24 ENCOUNTER — Ambulatory Visit

## 2024-06-24 ENCOUNTER — Encounter: Payer: Self-pay | Admitting: Family Medicine

## 2024-06-24 DIAGNOSIS — M5459 Other low back pain: Secondary | ICD-10-CM

## 2024-06-24 DIAGNOSIS — M6281 Muscle weakness (generalized): Secondary | ICD-10-CM | POA: Diagnosis not present

## 2024-06-24 NOTE — Therapy (Signed)
 OUTPATIENT PHYSICAL THERAPY THORACOLUMBAR TREATMENT   Patient Name: Victoria Williamson MRN: [id number] DOB:12/24/1965, 58 y.o., female Today's Date: 06/24/2024  END OF SESSION:  PT End of Session - 06/24/24 1247     Visit Number 11    Number of Visits 17    Date for Recertification  06/24/24    Authorization Type HB auth# [id number] for 13 PT vsts from 9/24-12/23    Authorization - Visit Number 10    Authorization - Number of Visits 13    Progress Note Due on Visit 10    PT Start Time 1300    PT Stop Time 1340    PT Time Calculation (min) 40 min    Activity Tolerance Patient tolerated treatment well    Behavior During Therapy [facility] for tasks assessed/performed            Past Medical History:  Diagnosis Date   Anticoagulant long-term use    eliquis --- managed by cardiology   Arthritis    Ascending aortic aneurysm 05/2022   had surgical consult 05-21-2022 w/ wayne gold PA ,   4.0 cm per CT 05-21-2022 in epic, active survillance   Chronic low back pain    DDD (degenerative disc disease), lumbar    Diastolic CHF, chronic (HCC)    followed by cardiology;    last echo 07-27-2021 ef 60-65%   DOE (dyspnea on exertion)    Dysautonomia (HCC) 12/18/2013   dx by cardiologist--- dr fernande   [phone]  pt stated no issues in passed few yrs)   Edema of both lower extremities    intermittant per pt   Fatty liver    GAD (generalized anxiety disorder)    History of adenomatous polyp of colon    Hx of skin cancer, basal cell    Hypertension    MDD (major depressive disorder)    PAF (paroxysmal atrial fibrillation) (HCC) 09/2019   cardiologist-  dr b. agbor-etang/  EP cardiologist-- dr cindie   PMB (postmenopausal bleeding)    PONV (postoperative nausea and vomiting)    Pre-diabetes    PSVT (paroxysmal supraventricular tachycardia)    Pulmonary emphysema (HCC)    followed by pcp   Pulmonary nodule    followed by pcp   Spondylolisthesis of lumbar region    Past Surgical  History:  Procedure Laterality Date   ATRIAL FIBRILLATION ABLATION N/A 07/15/2020   Procedure: ATRIAL FIBRILLATION ABLATION;  Surgeon: Cindie Ole DASEN, MD;  Location: [facility];  Service: Cardiovascular;  Laterality: N/A;   ATRIAL FIBRILLATION ABLATION N/A 11/29/2023   Procedure: ATRIAL FIBRILLATION ABLATION;  Surgeon: Cindie Ole DASEN, MD;  Location: [facility];  Service: Cardiovascular;  Laterality: N/A;   BREAST BIOPSY Right 07/17/2023   MM RT BREAST BX W LOC DEV 1ST LESION IMAGE BX SPEC STEREO GUIDE 07/17/2023 [facility]   CARDIAC CATHETERIZATION  06/03/2013   @[facility]  by dr bosie;  per pt was told normal coronary arteries   CARDIOVERSION N/A 05/11/2020   Procedure: CARDIOVERSION;  Surgeon: Darliss Rogue, MD;  Location: [facility];  Service: Cardiovascular;  Laterality: N/A;   CARPAL TUNNEL RELEASE Left 06/23/2020   Procedure: CARPAL TUNNEL RELEASE;  Surgeon: Murrell Kuba, MD;  Location: [facility];  Service: Orthopedics;  Laterality: Left;  IV REGIONAL FOREARM BLOCK   CARPAL TUNNEL RELEASE Right 2000   COLONOSCOPY  2020   HYSTEROSCOPY W/ ENDOMETRIAL ABLATION  02/23/2005   @[facility] ;  w/ novasure   HYSTEROSCOPY WITH D & C  N/A 07/24/2022   Procedure: ATTEMPTED DILATATION AND CURETTAGE /HYSTEROSCOPY;  Surgeon: Cleotilde Ronal RAMAN, MD;  Location: [facility];  Service: Gynecology;  Laterality: N/A;   OPERATIVE ULTRASOUND N/A 07/24/2022   Procedure: OPERATIVE ULTRASOUND;  Surgeon: Cleotilde Ronal RAMAN, MD;  Location: [facility];  Service: Gynecology;  Laterality: N/A;   REVISION AMPUTATION OF FINGER Left 12/17/2000   @[facility]  by dr sypher;  injury  left little finger   TUBAL LIGATION Bilateral    yrs ago   Patient Active Problem List   Diagnosis Date Noted   Atrial flutter (HCC) 02/27/2024   Atrial fibrillation (HCC) 11/29/2023   Flutter-fibrillation (HCC) 11/21/2023   Palpitations 11/21/2023   Hypercoagulable state due to paroxysmal  atrial fibrillation (HCC) 12/22/2022   History of endometrial ablation 08/07/2022   Postmenopausal bleeding 08/07/2022   Cervical stenosis (uterine cervix) 05/29/2022   Vulvar irritation 05/29/2022   Endometrial mass 05/29/2022   Ascending aorta dilatation 05/29/2022   Paraspinal mass 05/29/2022   Prediabetes 05/18/2022   Other fatigue 05/18/2022   Shortness of breath 05/18/2022   Pulmonary emphysema (HCC) 05/18/2022   Esophageal dysphagia 05/18/2022   Right conjunctivitis 04/23/2022   Hematuria 12/01/2021   Abnormal uterine bleeding (AUB) 12/01/2021   Former smoker 10/20/2021   Chronic bilateral low back pain without sciatica 05/15/2021   COVID-19 04/25/2021   Obesity (BMI 30-39.9) 03/17/2021   Bipolar depression (HCC) 03/17/2021   Preventative health care 03/17/2021   Vitamin D deficiency 03/17/2021   Skin lesion 03/17/2021   SVT (supraventricular tachycardia) 11/23/2020   Arthritis    Carpal tunnel syndrome of left wrist 02/04/2020   Chronic heart failure with preserved ejection fraction (HCC) 01/11/2020   Hepatic steatosis 12/18/2019   High serum thyroid  stimulating hormone (TSH) 12/18/2019   Lower extremity edema 12/17/2019   Neck swelling 12/17/2019   Paroxysmal atrial fibrillation (HCC) 12/17/2019   Lumbar radiculitis 01/16/2019   Tubular adenoma 08/22/2018   B12 deficiency 01/14/2018   Essential hypertension 05/19/2014   Gastroesophageal reflux disease without esophagitis 05/19/2014   Vasovagal syndrome 12/25/2013   Anxiety and depression 12/20/2013   Dysautonomia (HCC) 12/18/2013    PCP: [name], NP  REFERRING PROVIDER: [name], NP  REFERRING DIAG:  G89.29,M54.41 (ICD-10-CM) - Chronic bilateral low back pain with right-sided sciatica  Rationale for Evaluation and Treatment: Rehabilitation  THERAPY DIAG:  Other low back pain  Muscle weakness (generalized)  ONSET DATE: chronic  SUBJECTIVE:  SUBJECTIVE STATEMENT: Patient reports she is coming to therapy for her back pain and her MRI showed pinched nerve on the L side. Intermittent pain down her L leg and to R knee. Patient reports she had early retirement due to not being able to complete her job duties (heavy lifting, walking, squatting, etc.)   PERTINENT HISTORY:  Per MD note on 04/09/24, She has a history of chronic back pain, described as a dull aching pain at the waistline, exacerbated by movement and persistent throughout the day. The pain occasionally radiates down her right leg to the knee and has caused numbness in the left leg in the past. She reports leg weakness, particularly when walking, and attributes some of this to her weight.   PAIN:  Are you having pain? Yes: NPRS scale: 1-2/10; worst 10/10  Pain location: low back  Pain description: dull, ache, constant Aggravating factors: walking, lifting, standing >10-15 minutes  Relieving factors: sitting  PRECAUTIONS: None  RED FLAGS: None   WEIGHT BEARING RESTRICTIONS: No  FALLS:  Has patient fallen in last 6 months? No  LIVING ENVIRONMENT: Lives with: lives with their spouse Lives in: House/apartment Stairs: Yes: External: 4 steps; can reach both Has following equipment at home: None  OCCUPATION: retired   PLOF: Independent  PATIENT GOALS: to relieve some of the pain and be more mobile   OBJECTIVE:  Note: Objective measures were completed at Evaluation unless otherwise noted.  DIAGNOSTIC FINDINGS:  MRI SPINE LUMBAR WO CONTRAST Collected on Feb 26, 2024 9:45 AM  IMPRESSION: Mild degenerative changes of the lumbar spine.  PATIENT SURVEYS:  Modified Oswestry:  MODIFIED OSWESTRY DISABILITY SCALE  Date: 04/29/24 Score  Pain intensity 1 = The pain is bad, but I can manage without having to take (1) I can stand as  long as I want but, it increases my pain. pain medication.  2. Personal care (washing, dressing, etc.) 0 =  I can take care of myself normally without causing increased pain.  3. Lifting 2 = Pain prevents me from lifting heavy weights off the floor, activities (eg. sports, dancing). but I can manage if the weights are conveniently positioned (3) Pain prevents me from going out very often. (eg, on a table).  4. Walking 3 =  Pain prevents me from walking more than  mile.  5. Sitting 0 =  I can sit in any chair as long as I like.  6. Standing 4 =  Pain prevents me from standing more than 10 minutes.  7. Sleeping 5 =  Pain prevents me from sleeping at all.  8. Social Life 1 =  My social life is normal, but it increases my level of pain.  9. Traveling 0 =  I can travel anywhere without increased pain.  10. Employment/ Homemaking 5 = Pain prevents me from performing any job or homemaking chores.  Total 21/50 = 42%   Interpretation of scores: Score Category Description  0-20% Minimal Disability The patient can cope with most living activities. Usually no treatment is indicated apart from advice on lifting, sitting and exercise  21-40% Moderate Disability The patient experiences more pain and difficulty with sitting, lifting and standing. Travel and social life are more difficult and they may be disabled from work. Personal care, sexual activity and sleeping are not grossly affected, and the patient can usually be managed by conservative means  41-60% Severe Disability Pain remains the main problem in this group, but activities of daily living are affected. These patients  require a detailed investigation  61-80% Crippled Back pain impinges on all aspects of the patient's life. Positive intervention is required  81-100% Bed-bound  These patients are either bed-bound or exaggerating their symptoms  Bluford FORBES Zoe DELENA Karon DELENA, et al. Surgery versus conservative management of stable thoracolumbar  fracture: the PRESTO feasibility RCT. [place] ([place]): [employer]; 2021 Nov. [facility] Technology Assessment, No. 25.62.) Appendix 3, Oswestry Disability Index category descriptors. Available from: Findjewelers.cz  Minimally Clinically Important Difference (MCID) = 12.8%  COGNITION: Overall cognitive status: Within functional limits for tasks assessed     SENSATION: WFL  MUSCLE LENGTH: Hamstrings: Right 40 deg; Left 45 deg  POSTURE: rounded shoulders and forward head  PALPATION: Tightness noted to lumbar paraspinals (L>R), piriformis bilaterally TTP over spinous process of lumbar spine during CPAs  LUMBAR ROM:   AROM eval  Flexion Fingertips to mid shin   Extension 25%   Right lateral flexion WFL  Left lateral flexion WFL*  Right rotation 50%  Left rotation 50%*   (Blank rows = not tested)  LOWER EXTREMITY ROM:     WFL  LOWER EXTREMITY MMT:    MMT Right eval Left eval  Hip flexion 4+ 4+  Hip extension    Hip abduction 4+ 4+  Hip adduction 4+ 4+  Hip internal rotation    Hip external rotation    Knee flexion 4+ 4+  Knee extension 4+ 4+  Ankle dorsiflexion 4 4  Ankle plantarflexion    Ankle inversion    Ankle eversion     (Blank rows = not tested)  LUMBAR SPECIAL TESTS:  Straight leg raise test: Negative and FABER test: Negative  FUNCTIONAL TESTS:  5 times sit to stand: 21.85 seconds 6 minute walk test: to be completed visit #2  10 meter walk test: to be completed visit #2   GAIT: Distance walked: 26' Assistive device utilized: None Level of assistance: Complete Independence Comments: guarded gait, no other obvious gait deficits   TREATMENT DATE: 06/24/24                                                                                                                     Subjective: Patient reports 4/10 pain in the lower back however she did perform a lot of errands yesterday. She reported prolonged standing and  walking positions.  No further questions or concerns.   Therapeutic Exercise:  Dead Bug Progression for core stability:  Supine 90-90 with alternating LE ext   2 x 10     Dead bug - Alternating UE/LE    3 x 10   Hook lying - Posterior Pelvic Tilt for improved lumbar mobility   2 x 10 reps  x 5s hold  Hook lying Lumbar Rotation  2 x 10 ea direction  Double Knee to Chest Stretch   30s/bout x 2 in order to improve lumbar mobility   Modified Russian Twist - Edge of Mat Table   2 x 20 reps - 3 Kg MB  Therapeutic Activity (with intent to improve core stabilization with lifting, standing, walking):   Nustep level 6-2 x 5 minutes (seat 9) x UE/LE x > 80 SPM to improve cardiorespiratory endurance and improve trunk mobility.   Standing Barbell Twist   3 x 20 - 45#  Kettle Bell Squat   2 x 10 - 20# KB   Lateral Stepping Against resistance   4 x 12' - Blue TB around thighs  4 x 12' - Blue TB around ankles  4 x 12' -     PATIENT EDUCATION:  Education details: Exercise Technique   Person educated: Patient Education method: Explanation, Demonstration, and Handouts Education comprehension: verbalized understanding and returned demonstration  HOME EXERCISE PROGRAM: Access Code: [email or web address] URL: [email or web address] Date: 06/22/2024 Prepared by: Lonni Kristan Votta  Exercises - Seated Piriformis Stretch  - 2-3 x daily - 3-4 x weekly - 3-5 reps - 30-60 seconds hold - Seated Hamstring Stretch  - 2-3 x daily - 3-4 x weekly - 3-5 reps - 30-60 seconds hold - Supine Lower Trunk Rotation  - 2-3 x daily - 3-4 x weekly - 3 sets - 10 reps - Sidelying Thoracic Rotation with Open Book  - 2-3 x daily - 3-4 x weekly - 2 sets - 10 reps - Hooklying Clamshell with Resistance  - 1-2 x daily - 3-4 x weekly - 3 sets - 10 reps - Supine March with Resistance Band  - 1-2 x daily - 3-4 x weekly - 3 sets - 10 reps - Supine 90/90 with Leg Extensions  - 1 x daily - 3-4 x weekly - 2-3 sets -  10 reps - Supine Dead Bug with Leg Extension  - 1 x daily - 3-4 x weekly - 2-3 sets - 10 reps  Patient Education - Right Epley Maneuver  Access Code: [email or web address] URL: [email or web address] Date: 06/04/2024 Prepared by: Lonni Pall  Exercises - Seated Piriformis Stretch  - 2-3 x daily - 5-7 x weekly - 3-5 reps - 30-60 seconds hold - Seated Hamstring Stretch  - 2-3 x daily - 5-7 x weekly - 3-5 reps - 30-60 seconds hold - Supine Lower Trunk Rotation  - 2-3 x daily - 5-7 x weekly - 3 sets - 10 reps - Sidelying Thoracic Rotation with Open Book  - 2-3 x daily - 5-7 x weekly - 2 sets - 10 reps - Hooklying Clamshell with Resistance  - 1-2 x daily - 5-7 x weekly - 3 sets - 10 reps - Supine March with Resistance Band  - 1-2 x daily - 5-7 x weekly - 3 sets - 10 reps  Patient Education - Right Epley Maneuver  Access Code: [email or web address] URL: [email or web address] Date: 05/06/2024 Prepared by: Maryanne Finder  Exercises - Seated Piriformis Stretch  - 2-3 x daily - 5-7 x weekly - 3-5 reps - 30-60 seconds hold - Seated Hamstring Stretch  - 2-3 x daily - 5-7 x weekly - 3-5 reps - 30-60 seconds hold - Supine Lower Trunk Rotation  - 2-3 x daily - 5-7 x weekly - 3 sets - 10 reps - Sidelying Thoracic Rotation with Open Book  - 2-3 x daily - 5-7 x weekly - 2 sets - 10 reps - Hooklying Clamshell with Resistance  - 1-2 x daily - 5-7 x weekly - 3 sets - 10 reps - Supine March with Resistance Band  - 1-2 x daily - 5-7 x weekly - 3 sets - 10 reps  ASSESSMENT:  CLINICAL IMPRESSION:  Patient reported to OPPT for continued management of lower back pain. PT session focused on dynamic movements with TA activation. Good ability to perform rotational movements while maintaining TA activation.She continues to have good carryover of squat form without pain in her lower back; increased repetitions provoke bilateral knee pain. Pt agreeable to d/c back to home with HEP in following  appt.  OBJECTIVE IMPAIRMENTS: Abnormal gait, decreased activity tolerance, decreased endurance, decreased mobility, difficulty walking, decreased strength, impaired flexibility, postural dysfunction, and pain.   ACTIVITY LIMITATIONS: lifting, bending, standing, squatting, stairs, and locomotion level  PARTICIPATION LIMITATIONS: cleaning, shopping, community activity, and occupation  PERSONAL FACTORS: Age, Behavior pattern, Past/current experiences, and Time since onset of injury/illness/exacerbation are also affecting patient's functional outcome.   REHAB POTENTIAL: Good  CLINICAL DECISION MAKING: Stable/uncomplicated  EVALUATION COMPLEXITY: Moderate   GOALS: Goals reviewed with patient? Yes  SHORT TERM GOALS: Target date: 05/27/2024  Patient will be independent in HEP to improve strength/mobility for better functional independence with ADLs.  Baseline: 50% adherent  Goal status: Progressing   LONG TERM GOALS: Target date: 06/24/2024  Patient will reduce modified Oswestry score to <20 as to demonstrate minimal disability with ADLs including improved sleeping tolerance, walking/sitting tolerance etc for better mobility with ADLs. Baseline: 05/04/2024: 21/50 = 42%; 06/22/2024: 18/50 = 36%  Goal status: Progressing  2.  Patient will report a worst pain of 3/10 on NRPS in low back to improve tolerance with ADLs and reduced symptoms with activities.  Baseline: 04/29/24: 10/10; 06/22/2024: 7/10 Goal status: Progressing   3.  Patient (< 68 years old) will complete five times sit to stand test in < 10 seconds indicating an increased LE strength and improved balance.  Baseline: 21.85 seconds; 06/22/2024: 12.00s Goal status: Progressing   4.  Patient will improve by 47m (164') in order to demonstrate clinically significant improvement in cardiopulmonary endurance and community ambulation  Baseline: 1242' with no AD; 06/22/2024: 1455'   Goal status: Goal Met  5.  Patient will  tolerate standing >30 minutes to demonstrate improved tolerance to activity to be able to complete iADLs at home.  Baseline: 04/29/24: unable to tolerate >10-15 minutes; 06/22/2024: 15-20 min  Goal status: Progressing    PLAN:  PT FREQUENCY: 1-2x/week  PT DURATION: 8 weeks  PLANNED INTERVENTIONS: 97164- PT Re-evaluation, 97750- Physical Performance Testing, 97110-Therapeutic exercises, 97530- Therapeutic activity, 97112- Neuromuscular re-education, 97535- Self Care, 02859- Manual therapy, [phone]- Gait training, [phone]- Electrical stimulation (unattended), [phone]- Electrical stimulation (manual), Patient/Family education, Balance training, Stair training, Joint mobilization, Joint manipulation, Spinal manipulation, Spinal mobilization, Cryotherapy, and Moist heat.  PLAN FOR NEXT SESSION: HEP review, core strengthening, stretching   Lonni Pall PT, DPT Physical Therapist- [facility] 06/24/2024, 1:02 PM

## 2024-06-29 ENCOUNTER — Ambulatory Visit

## 2024-06-29 ENCOUNTER — Telehealth (HOSPITAL_BASED_OUTPATIENT_CLINIC_OR_DEPARTMENT_OTHER): Payer: Self-pay | Admitting: *Deleted

## 2024-06-29 DIAGNOSIS — M5459 Other low back pain: Secondary | ICD-10-CM | POA: Diagnosis not present

## 2024-06-29 DIAGNOSIS — M6281 Muscle weakness (generalized): Secondary | ICD-10-CM

## 2024-06-29 NOTE — Telephone Encounter (Signed)
 Pt has been scheduled tele preop appt 08/07/24 med rec and consent are done.     Patient Consent for Virtual Visit        Victoria Williamson has provided verbal consent on 06/29/2024 for a virtual visit (video or telephone).   CONSENT FOR VIRTUAL VISIT FOR:  Victoria Williamson  By participating in this virtual visit I agree to the following:  I hereby voluntarily request, consent and authorize [facility] and its employed or contracted physicians, physician assistants, nurse practitioners or other licensed health care professionals (the Practitioner), to provide me with telemedicine health care services (the "Services) as deemed necessary by the treating Practitioner. I acknowledge and consent to receive the Services by the Practitioner via telemedicine. I understand that the telemedicine visit will involve communicating with the Practitioner through live audiovisual communication technology and the disclosure of certain medical information by electronic transmission. I acknowledge that I have been given the opportunity to request an in-person assessment or other available alternative prior to the telemedicine visit and am voluntarily participating in the telemedicine visit.  I understand that I have the right to withhold or withdraw my consent to the use of telemedicine in the course of my care at any time, without affecting my right to future care or treatment, and that the Practitioner or I may terminate the telemedicine visit at any time. I understand that I have the right to inspect all information obtained and/or recorded in the course of the telemedicine visit and may receive copies of available information for a reasonable fee.  I understand that some of the potential risks of receiving the Services via telemedicine include:  Delay or interruption in medical evaluation due to technological equipment failure or disruption; Information transmitted may not be sufficient (e.g. poor  resolution of images) to allow for appropriate medical decision making by the Practitioner; and/or  In rare instances, security protocols could fail, causing a breach of personal health information.  Furthermore, I acknowledge that it is my responsibility to provide information about my medical history, conditions and care that is complete and accurate to the best of my ability. I acknowledge that Practitioner's advice, recommendations, and/or decision may be based on factors not within their control, such as incomplete or inaccurate data provided by me or distortions of diagnostic images or specimens that may result from electronic transmissions. I understand that the practice of medicine is not an exact science and that Practitioner makes no warranties or guarantees regarding treatment outcomes. I acknowledge that a copy of this consent can be made available to me via my patient portal [facility] MyChart), or I can request a printed copy by calling the office of [facility].    I understand that my insurance will be billed for this visit.   I have read or had this consent read to me. I understand the contents of this consent, which adequately explains the benefits and risks of the Services being provided via telemedicine.  I have been provided ample opportunity to ask questions regarding this consent and the Services and have had my questions answered to my satisfaction. I give my informed consent for the services to be provided through the use of telemedicine in my medical care

## 2024-06-29 NOTE — Therapy (Addendum)
 OUTPATIENT PHYSICAL THERAPY THORACOLUMBAR TREATMENT/DISCHARGE SUMMARY   Patient Name: Victoria Williamson MRN: [id number] DOB:14-Oct-1965, 58 y.o., female Today's Date: 06/29/2024  END OF SESSION:  PT End of Session - 06/29/24 1121     Visit Number 12    Number of Visits 17    Date for Recertification  06/24/24    Authorization Type HB auth# [id number] for 13 PT vsts from 9/24-12/23    Authorization - Visit Number 11    Authorization - Number of Visits 13    Progress Note Due on Visit 10    PT Start Time 1120    PT Stop Time 1200    PT Time Calculation (min) 40 min    Activity Tolerance Patient tolerated treatment well    Behavior During Therapy [facility] for tasks assessed/performed             Past Medical History:  Diagnosis Date   Anticoagulant long-term use    eliquis --- managed by cardiology   Arthritis    Ascending aortic aneurysm 05/2022   had surgical consult 05-21-2022 w/ wayne gold PA ,   4.0 cm per CT 05-21-2022 in epic, active survillance   Chronic low back pain    DDD (degenerative disc disease), lumbar    Diastolic CHF, chronic (HCC)    followed by cardiology;    last echo 07-27-2021 ef 60-65%   DOE (dyspnea on exertion)    Dysautonomia (HCC) 12/18/2013   dx by cardiologist--- dr fernande   [phone]  pt stated no issues in passed few yrs)   Edema of both lower extremities    intermittant per pt   Fatty liver    GAD (generalized anxiety disorder)    History of adenomatous polyp of colon    Hx of skin cancer, basal cell    Hypertension    MDD (major depressive disorder)    PAF (paroxysmal atrial fibrillation) (HCC) 09/2019   cardiologist-  dr b. agbor-etang/  EP cardiologist-- dr cindie   PMB (postmenopausal bleeding)    PONV (postoperative nausea and vomiting)    Pre-diabetes    PSVT (paroxysmal supraventricular tachycardia)    Pulmonary emphysema (HCC)    followed by pcp   Pulmonary nodule    followed by pcp   Spondylolisthesis of lumbar region     Past Surgical History:  Procedure Laterality Date   ATRIAL FIBRILLATION ABLATION N/A 07/15/2020   Procedure: ATRIAL FIBRILLATION ABLATION;  Surgeon: Cindie Ole DASEN, MD;  Location: [facility];  Service: Cardiovascular;  Laterality: N/A;   ATRIAL FIBRILLATION ABLATION N/A 11/29/2023   Procedure: ATRIAL FIBRILLATION ABLATION;  Surgeon: Cindie Ole DASEN, MD;  Location: [facility];  Service: Cardiovascular;  Laterality: N/A;   BREAST BIOPSY Right 07/17/2023   MM RT BREAST BX W LOC DEV 1ST LESION IMAGE BX SPEC STEREO GUIDE 07/17/2023 [facility]   CARDIAC CATHETERIZATION  06/03/2013   @[facility]  by dr bosie;  per pt was told normal coronary arteries   CARDIOVERSION N/A 05/11/2020   Procedure: CARDIOVERSION;  Surgeon: Darliss Rogue, MD;  Location: [facility];  Service: Cardiovascular;  Laterality: N/A;   CARPAL TUNNEL RELEASE Left 06/23/2020   Procedure: CARPAL TUNNEL RELEASE;  Surgeon: Murrell Kuba, MD;  Location: [facility];  Service: Orthopedics;  Laterality: Left;  IV REGIONAL FOREARM BLOCK   CARPAL TUNNEL RELEASE Right 2000   COLONOSCOPY  2020   HYSTEROSCOPY W/ ENDOMETRIAL ABLATION  02/23/2005   @[facility] ;  w/ novasure   HYSTEROSCOPY WITH D &  C N/A 07/24/2022   Procedure: ATTEMPTED DILATATION AND CURETTAGE /HYSTEROSCOPY;  Surgeon: Cleotilde Ronal RAMAN, MD;  Location: [facility];  Service: Gynecology;  Laterality: N/A;   OPERATIVE ULTRASOUND N/A 07/24/2022   Procedure: OPERATIVE ULTRASOUND;  Surgeon: Cleotilde Ronal RAMAN, MD;  Location: [facility];  Service: Gynecology;  Laterality: N/A;   REVISION AMPUTATION OF FINGER Left 12/17/2000   @[facility]  by dr sypher;  injury  left little finger   TUBAL LIGATION Bilateral    yrs ago   Patient Active Problem List   Diagnosis Date Noted   Atrial flutter (HCC) 02/27/2024   Atrial fibrillation (HCC) 11/29/2023   Flutter-fibrillation (HCC) 11/21/2023   Palpitations 11/21/2023   Hypercoagulable state  due to paroxysmal atrial fibrillation (HCC) 12/22/2022   History of endometrial ablation 08/07/2022   Postmenopausal bleeding 08/07/2022   Cervical stenosis (uterine cervix) 05/29/2022   Vulvar irritation 05/29/2022   Endometrial mass 05/29/2022   Ascending aorta dilatation 05/29/2022   Paraspinal mass 05/29/2022   Prediabetes 05/18/2022   Other fatigue 05/18/2022   Shortness of breath 05/18/2022   Pulmonary emphysema (HCC) 05/18/2022   Esophageal dysphagia 05/18/2022   Right conjunctivitis 04/23/2022   Hematuria 12/01/2021   Abnormal uterine bleeding (AUB) 12/01/2021   Former smoker 10/20/2021   Chronic bilateral low back pain without sciatica 05/15/2021   COVID-19 04/25/2021   Obesity (BMI 30-39.9) 03/17/2021   Bipolar depression (HCC) 03/17/2021   Preventative health care 03/17/2021   Vitamin D deficiency 03/17/2021   Skin lesion 03/17/2021   SVT (supraventricular tachycardia) 11/23/2020   Arthritis    Carpal tunnel syndrome of left wrist 02/04/2020   Chronic heart failure with preserved ejection fraction (HCC) 01/11/2020   Hepatic steatosis 12/18/2019   High serum thyroid  stimulating hormone (TSH) 12/18/2019   Lower extremity edema 12/17/2019   Neck swelling 12/17/2019   Paroxysmal atrial fibrillation (HCC) 12/17/2019   Lumbar radiculitis 01/16/2019   Tubular adenoma 08/22/2018   B12 deficiency 01/14/2018   Essential hypertension 05/19/2014   Gastroesophageal reflux disease without esophagitis 05/19/2014   Vasovagal syndrome 12/25/2013   Anxiety and depression 12/20/2013   Dysautonomia (HCC) 12/18/2013    PCP: [name], NP  REFERRING PROVIDER: [name], NP  REFERRING DIAG:  G89.29,M54.41 (ICD-10-CM) - Chronic bilateral low back pain with right-sided sciatica  Rationale for Evaluation and Treatment: Rehabilitation  THERAPY DIAG:  Other low back pain  Muscle weakness (generalized)  ONSET DATE: chronic  SUBJECTIVE:  SUBJECTIVE STATEMENT: Patient reports she is coming to therapy for her back pain and her MRI showed pinched nerve on the L side. Intermittent pain down her L leg and to R knee. Patient reports she had early retirement due to not being able to complete her job duties (heavy lifting, walking, squatting, etc.)   PERTINENT HISTORY:  Per MD note on 04/09/24, She has a history of chronic back pain, described as a dull aching pain at the waistline, exacerbated by movement and persistent throughout the day. The pain occasionally radiates down her right leg to the knee and has caused numbness in the left leg in the past. She reports leg weakness, particularly when walking, and attributes some of this to her weight.   PAIN:  Are you having pain? Yes: NPRS scale: 1-2/10; worst 10/10  Pain location: low back  Pain description: dull, ache, constant Aggravating factors: walking, lifting, standing >10-15 minutes  Relieving factors: sitting  PRECAUTIONS: None  RED FLAGS: None   WEIGHT BEARING RESTRICTIONS: No  FALLS:  Has patient fallen in last 6 months? No  LIVING ENVIRONMENT: Lives with: lives with their spouse Lives in: House/apartment Stairs: Yes: External: 4 steps; can reach both Has following equipment at home: None  OCCUPATION: retired   PLOF: Independent  PATIENT GOALS: to relieve some of the pain and be more mobile   OBJECTIVE:  Note: Objective measures were completed at Evaluation unless otherwise noted.  DIAGNOSTIC FINDINGS:  MRI SPINE LUMBAR WO CONTRAST Collected on Feb 26, 2024 9:45 AM  IMPRESSION: Mild degenerative changes of the lumbar spine.  PATIENT SURVEYS:  Modified Oswestry:  MODIFIED OSWESTRY DISABILITY SCALE  Date: 04/29/24 Score  Pain intensity 1 = The pain is bad, but I can manage without having to take (1)  I can stand as long as I want but, it increases my pain. pain medication.  2. Personal care (washing, dressing, etc.) 0 =  I can take care of myself normally without causing increased pain.  3. Lifting 2 = Pain prevents me from lifting heavy weights off the floor, activities (eg. sports, dancing). but I can manage if the weights are conveniently positioned (3) Pain prevents me from going out very often. (eg, on a table).  4. Walking 3 =  Pain prevents me from walking more than  mile.  5. Sitting 0 =  I can sit in any chair as long as I like.  6. Standing 4 =  Pain prevents me from standing more than 10 minutes.  7. Sleeping 5 =  Pain prevents me from sleeping at all.  8. Social Life 1 =  My social life is normal, but it increases my level of pain.  9. Traveling 0 =  I can travel anywhere without increased pain.  10. Employment/ Homemaking 5 = Pain prevents me from performing any job or homemaking chores.  Total 21/50 = 42%   Interpretation of scores: Score Category Description  0-20% Minimal Disability The patient can cope with most living activities. Usually no treatment is indicated apart from advice on lifting, sitting and exercise  21-40% Moderate Disability The patient experiences more pain and difficulty with sitting, lifting and standing. Travel and social life are more difficult and they may be disabled from work. Personal care, sexual activity and sleeping are not grossly affected, and the patient can usually be managed by conservative means  41-60% Severe Disability Pain remains the main problem in this group, but activities of daily living are affected. These patients  require a detailed investigation  61-80% Crippled Back pain impinges on all aspects of the patient's life. Positive intervention is required  81-100% Bed-bound  These patients are either bed-bound or exaggerating their symptoms  Bluford FORBES Zoe DELENA Karon DELENA, et al. Surgery versus conservative management of stable  thoracolumbar fracture: the PRESTO feasibility RCT. [place] ([place]): [employer]; 2021 Nov. [facility] Technology Assessment, No. 25.62.) Appendix 3, Oswestry Disability Index category descriptors. Available from: [email or web address].cz  Minimally Clinically Important Difference (MCID) = 12.8%  COGNITION: Overall cognitive status: Within functional limits for tasks assessed     SENSATION: WFL  MUSCLE LENGTH: Hamstrings: Right 40 deg; Left 45 deg  POSTURE: rounded shoulders and forward head  PALPATION: Tightness noted to lumbar paraspinals (L>R), piriformis bilaterally TTP over spinous process of lumbar spine during CPAs  LUMBAR ROM:   AROM eval  Flexion Fingertips to mid shin   Extension 25%   Right lateral flexion WFL  Left lateral flexion WFL*  Right rotation 50%  Left rotation 50%*   (Blank rows = not tested)  LOWER EXTREMITY ROM:     WFL  LOWER EXTREMITY MMT:    MMT Right eval Left eval  Hip flexion 4+ 4+  Hip extension    Hip abduction 4+ 4+  Hip adduction 4+ 4+  Hip internal rotation    Hip external rotation    Knee flexion 4+ 4+  Knee extension 4+ 4+  Ankle dorsiflexion 4 4  Ankle plantarflexion    Ankle inversion    Ankle eversion     (Blank rows = not tested)  LUMBAR SPECIAL TESTS:  Straight leg raise test: Negative and FABER test: Negative  FUNCTIONAL TESTS:  5 times sit to stand: 21.85 seconds 6 minute walk test: to be completed visit #2  10 meter walk test: to be completed visit #2   GAIT: Distance walked: 45' Assistive device utilized: None Level of assistance: Complete Independence Comments: guarded gait, no other obvious gait deficits   TREATMENT DATE: 06/29/24                                                                                                                     Subjective: Patient without any pain at start of session. She reports two days ago she had some dull aching pain and it was  mitigated with HEP stretches. No further questions or concerns.   Therapeutic Exercise: Hook lying - Posterior Pelvic Tilt for improved lumbar mobility   2 x 10 reps x 5s hold  Supine Dead Bug  2 x 10 - Alternating UE/LE   Supine 90/90 Alternating LE extension   2 x 10   Hook lying Lumbar Rotation  2 x 10 ea direction  Supine Bridge Progression   2 x 10   - minor muscle spasm in L  hamstring   - second set with ankles extended further    1 x 10 - on heels with LE extended   Seated Russian Twist   2 x  20 - Alternating Rotation - 3 Kg Med ball  Time spent reviewing remainder of HEP verbally with handout. Provided theraband progression ([place], [place], [place], [place]). Patient with good return demonstration and verbal understanding of remaining exercises.   Therapeutic Activity (with intent to improve core stabilization with lifting, standing, walking):   Nustep level 3-2 x 5 minutes (seat 9) x UE/LE x > 80 SPM to improve cardiorespiratory endurance and improve trunk mobility.   Resisted Squats with barbell plate   1 x 8 - 25# Plate   1 x 10 - 10# Plate   Lateral Stepping with Resistance    2 x 12' - Blue TB around ankles   2 x 12' -     PATIENT EDUCATION:  Education details: Exercise Technique   Person educated: Patient Education method: Explanation, Demonstration, and Handouts Education comprehension: verbalized understanding and returned demonstration  HOME EXERCISE PROGRAM: Access Code: [email or web address] URL: [email or web address] Date: 06/29/2024 Prepared by: Lonni Asencion Loveday  Exercises - Seated Piriformis Stretch  - 2-3 x daily - 3-4 x weekly - 3-5 reps - 30-60 seconds hold - Seated Hamstring Stretch  - 2-3 x daily - 3-4 x weekly - 3-5 reps - 30-60 seconds hold - Supine Lower Trunk Rotation  - 2-3 x daily - 3-4 x weekly - 3 sets - 10 reps - Sidelying Thoracic Rotation with Open Book  - 2-3 x daily - 3-4 x weekly - 2 sets - 10 reps - Hooklying Clamshell with  Resistance  - 1-2 x daily - 3-4 x weekly - 3 sets - 10 reps - Supine March with Resistance Band  - 1-2 x daily - 3-4 x weekly - 3 sets - 10 reps - Supine 90/90 with Leg Extensions  - 1 x daily - 3-4 x weekly - 2-3 sets - 10 reps - Supine Dead Bug with Leg Extension  - 1 x daily - 3-4 x weekly - 2-3 sets - 10 reps - Mini Squat with Chair  - 1 x daily - 3-4 x weekly - 2-3 sets - 10-12 reps - Side Stepping with Resistance at Thighs and Counter Support  - 1 x daily - 3-4 x weekly - 2-3 sets - 10-12 reps  Patient Education - Right Epley Maneuver  Access Code: [email or web address] URL: [email or web address] Date: 06/04/2024 Prepared by: Lonni Pall  Exercises - Seated Piriformis Stretch  - 2-3 x daily - 5-7 x weekly - 3-5 reps - 30-60 seconds hold - Seated Hamstring Stretch  - 2-3 x daily - 5-7 x weekly - 3-5 reps - 30-60 seconds hold - Supine Lower Trunk Rotation  - 2-3 x daily - 5-7 x weekly - 3 sets - 10 reps - Sidelying Thoracic Rotation with Open Book  - 2-3 x daily - 5-7 x weekly - 2 sets - 10 reps - Hooklying Clamshell with Resistance  - 1-2 x daily - 5-7 x weekly - 3 sets - 10 reps - Supine March with Resistance Band  - 1-2 x daily - 5-7 x weekly - 3 sets - 10 reps  Patient Education - Right Epley Maneuver  Access Code: [email or web address] URL: [email or web address] Date: 05/06/2024 Prepared by: Maryanne Finder  Exercises - Seated Piriformis Stretch  - 2-3 x daily - 5-7 x weekly - 3-5 reps - 30-60 seconds hold - Seated Hamstring Stretch  - 2-3 x daily - 5-7 x weekly - 3-5 reps - 30-60 seconds hold - Supine Lower Trunk Rotation  - 2-3 x daily - 5-7 x weekly - 3  sets - 10 reps - Sidelying Thoracic Rotation with Open Book  - 2-3 x daily - 5-7 x weekly - 2 sets - 10 reps - Hooklying Clamshell with Resistance  - 1-2 x daily - 5-7 x weekly - 3 sets - 10 reps - Supine March with Resistance Band  - 1-2 x daily - 5-7 x weekly - 3 sets - 10 reps  ASSESSMENT:  CLINICAL  IMPRESSION:     Ms. Maddelyn Rocca is a 58 y.o. female seen for lower back pain with R sided sciatica. Pt reached end of POC and is agreeable to discharge to home with HEP. Pt has met 3/5 LTG and demonstrates improvements in LE strength, pain and function (see goals below).The patient has achieved independence with daily activities and is able to perform exercises with proper technique. PT believes that no longer require skilled physical therapy interventions and are discharged with a home exercise program to maintain progress. With continued adherence to HEP patient will progress to meeting remaining LTGs. No question at end of session. PT advised to follow-up with their primary care provider is recommended if any issues arise.   OBJECTIVE IMPAIRMENTS: Abnormal gait, decreased activity tolerance, decreased endurance, decreased mobility, difficulty walking, decreased strength, impaired flexibility, postural dysfunction, and pain.   ACTIVITY LIMITATIONS: lifting, bending, standing, squatting, stairs, and locomotion level  PARTICIPATION LIMITATIONS: cleaning, shopping, community activity, and occupation  PERSONAL FACTORS: Age, Behavior pattern, Past/current experiences, and Time since onset of injury/illness/exacerbation are also affecting patient's functional outcome.   REHAB POTENTIAL: Good  CLINICAL DECISION MAKING: Stable/uncomplicated  EVALUATION COMPLEXITY: Moderate   GOALS: Goals reviewed with patient? Yes  SHORT TERM GOALS: Target date: 05/27/2024  Patient will be independent in HEP to improve strength/mobility for better functional independence with ADLs.  Baseline: 50% adherent; 06/29/2024 55% Goal status: Progressing    LONG TERM GOALS: Target date: 06/24/2024  Patient will reduce modified Oswestry score to <20 as to demonstrate minimal disability with ADLs including improved sleeping tolerance, walking/sitting tolerance etc for better mobility with ADLs. Baseline:  05/04/2024: 21/50 = 42%; 06/22/2024: 18/50 = 36%; 06/29/2024: 32% Goal status: Progressing  2.  Patient will report a worst pain of 3/10 on NRPS in low back to improve tolerance with ADLs and reduced symptoms with activities.  Baseline: 04/29/24: 10/10; 06/22/2024: 7/10; 06/29/2024: 6/10 NPS Goal status: Progressing   3.  Patient (< 61 years old) will complete five times sit to stand test in < 10 seconds indicating an increased LE strength and improved balance.  Baseline: 21.85 seconds; 06/22/2024: 12.00s; 06/29/2024: 9.34s Goal status: Goal Met    4.  Patient will improve by 71m (164') in order to demonstrate clinically significant improvement in cardiopulmonary endurance and community ambulation  Baseline: 1242' with no AD; 06/22/2024: 1455'   Goal status: Goal Met  5.  Patient will tolerate standing >30 minutes to demonstrate improved tolerance to activity to be able to complete iADLs at home.  Baseline: 04/29/24: unable to tolerate >10-15 minutes; 06/22/2024: 15-20 min; 06/26/2024: > 45 min standing with movement  Goal status: Goal Met     PLAN:  PT FREQUENCY: 1-2x/week  PT DURATION: 8 weeks  PLANNED INTERVENTIONS: 97164- PT Re-evaluation, 97750- Physical Performance Testing, 97110-Therapeutic exercises, 97530- Therapeutic activity, 97112- Neuromuscular re-education, 97535- Self Care, 02859- Manual therapy, [phone]- Gait training, [phone]- Electrical stimulation (unattended), [phone]- Electrical stimulation (manual), Patient/Family education, Balance training, Stair training, Joint mobilization, Joint manipulation, Spinal manipulation, Spinal mobilization, Cryotherapy, and Moist heat.  PLAN FOR  NEXT SESSION: Discharge   Lonni Pall PT, DPT Physical Therapist- [facility] 06/29/2024, 12:08 PM

## 2024-06-29 NOTE — Telephone Encounter (Signed)
 Pt has been scheduled tele preop appt 08/07/24 med rec and consent are done.

## 2024-06-29 NOTE — Telephone Encounter (Signed)
   Name: Victoria Williamson  DOB: 09/04/1965  MRN: [id number]  Primary Cardiologist: Redell Cave, MD   Preoperative team, please contact this patient and set up a phone call appointment for further preoperative risk assessment. Please obtain consent and complete medication review. Thank you for your help.  Telephone visit should be completed late 07/2024 or early 08/2024.  I confirm that guidance regarding antiplatelet and oral anticoagulation therapy has been completed and, if necessary, noted below.  Per office protocol, patient can hold Eliquis  for 2 days prior to procedure   I also confirmed the patient resides in the state of [place] . As per [facility] Medical Board telemedicine laws, the patient must reside in the state in which the provider is licensed.   Lum LITTIE Louis, NP 06/29/2024, 4:25 PM [facility]

## 2024-06-29 NOTE — Telephone Encounter (Signed)
 Patient with diagnosis of A Fib on Eliquis  for anticoagulation.    Procedure: colonoscopy Date of procedure: 08/27/24   CHA2DS2-VASc Score = 4  This indicates a 4.8% annual risk of stroke. The patient's score is based upon: CHF History: 1 HTN History: 1 Diabetes History: 0 Stroke History: 0 Vascular Disease History: 1 Age Score: 0 Gender Score: 1     CrCl 115 ml/min Platelet count 211K  Patient has not had an Afib/aflutter ablation in the last 3 months, DCCV within the last 4 weeks or a watchman implanted in the last 45 days    Per office protocol, patient can hold Eliquis  for 2 days prior to procedure  **This guidance is not considered finalized until pre-operative APP has relayed final recommendations.**

## 2024-07-01 ENCOUNTER — Encounter

## 2024-07-01 ENCOUNTER — Telehealth: Payer: Self-pay | Admitting: Nurse Practitioner

## 2024-07-01 DIAGNOSIS — F419 Anxiety disorder, unspecified: Secondary | ICD-10-CM

## 2024-07-01 NOTE — Telephone Encounter (Signed)
 We stopped remeron  and started trazodone . See how it is working

## 2024-07-01 NOTE — Telephone Encounter (Signed)
 Noted. Has her sleep been better back on the mirtazapine ?

## 2024-07-01 NOTE — Telephone Encounter (Signed)
-----   Message from [facility] sent at 06/17/2024  2:31 PM EST ----- Regarding: sleep We stopped remeron  and started trazodone . See how it is working

## 2024-07-01 NOTE — Telephone Encounter (Signed)
 Spoke with patient.  Complains of not doing well on trazodone , couldn't get any sleep so she started back on Mirtazapine 

## 2024-07-03 LAB — GENECONNECT MOLECULAR SCREEN: Genetic Analysis Overall Interpretation: NEGATIVE

## 2024-07-07 ENCOUNTER — Ambulatory Visit
Admission: RE | Admit: 2024-07-07 | Discharge: 2024-07-07 | Disposition: A | Discharge status: Home / Self Care | Source: Ambulatory Visit | Attending: Nurse Practitioner | Admitting: Nurse Practitioner

## 2024-07-07 DIAGNOSIS — Z1231 Encounter for screening mammogram for malignant neoplasm of breast: Secondary | ICD-10-CM | POA: Diagnosis not present

## 2024-07-07 NOTE — Telephone Encounter (Signed)
 Called and spoke with patient.  She states that she is doing better with sleep on mirtazapine .  States of wanting refills for this prescription.

## 2024-07-08 MED ORDER — MIRTAZAPINE 15 MG PO TABS: 15.0000 mg | ORAL_TABLET | Freq: Every day | ORAL | 0 refills | Status: AC

## 2024-07-08 NOTE — Addendum Note (Signed)
 Addended by: [name] on: 07/08/2024 10:47 AM   Modules accepted: Orders

## 2024-07-08 NOTE — Telephone Encounter (Signed)
 Refill provided

## 2024-07-13 ENCOUNTER — Other Ambulatory Visit: Payer: Self-pay | Admitting: Nurse Practitioner

## 2024-07-13 DIAGNOSIS — R928 Other abnormal and inconclusive findings on diagnostic imaging of breast: Secondary | ICD-10-CM

## 2024-07-16 ENCOUNTER — Inpatient Hospital Stay: Admission: RE | Admit: 2024-07-16 | Discharge: 2024-07-16 | Attending: Nurse Practitioner

## 2024-07-16 DIAGNOSIS — R928 Other abnormal and inconclusive findings on diagnostic imaging of breast: Secondary | ICD-10-CM

## 2024-07-17 ENCOUNTER — Other Ambulatory Visit: Payer: Self-pay | Admitting: Acute Care

## 2024-07-17 DIAGNOSIS — Z122 Encounter for screening for malignant neoplasm of respiratory organs: Secondary | ICD-10-CM

## 2024-07-17 DIAGNOSIS — Z87891 Personal history of nicotine dependence: Secondary | ICD-10-CM

## 2024-07-20 NOTE — Telephone Encounter (Signed)
 Called and spoke with patient. Pt has phone number to dermatology center and will either wait for them to call her or give them a call tomorrow.

## 2024-07-20 NOTE — Telephone Encounter (Unsigned)
 Copied from CRM [id number]. Topic: Referral - Status >> Jul 20, 2024 11:34 AM Avram MATSU wrote: Reason for CRM: patient would like to know the status of her referral for dermatology, please advise Mobile [phone]

## 2024-08-04 ENCOUNTER — Encounter: Payer: Self-pay | Admitting: Nurse Practitioner

## 2024-08-04 DIAGNOSIS — M545 Low back pain, unspecified: Secondary | ICD-10-CM

## 2024-08-05 NOTE — Addendum Note (Signed)
 Addended by: [name] on: 08/05/2024 03:18 PM   Modules accepted: Orders

## 2024-08-07 ENCOUNTER — Ambulatory Visit: Attending: Cardiology

## 2024-08-07 DIAGNOSIS — Z01810 Encounter for preprocedural cardiovascular examination: Secondary | ICD-10-CM | POA: Diagnosis present

## 2024-08-07 NOTE — Progress Notes (Signed)
 "   Virtual Visit via Telephone Note   Because of [name] co-morbid illnesses, she is at least at moderate risk for complications without adequate follow up.  This format is felt to be most appropriate for this patient at this time.  Due to technical limitations with video connection (technology), today's appointment will be conducted as an audio only telehealth visit, and [name] verbally agreed to proceed in this manner.   All issues noted in this document were discussed and addressed.  No physical exam could be performed with this format.  Evaluation Performed:  Preoperative cardiovascular risk assessment _____________   Date:  08/07/2024   Patient ID:  Victoria Williamson, DOB [birth year], MRN [id number] Patient Location:  Home Provider location:   Office  Primary Care Provider:  [name], NP Primary Cardiologist:  Redell Cave, MD  Chief Complaint / Patient Profile   59 y.o. y/o female with a h/o hypertension, paroxysmal atrial fibrillation s/p ablation 07/2020, redo ablation, 11/2023 SVT, obesity who is pending colonoscopy on 08/27/2024 and presents today for telephonic preoperative cardiovascular risk assessment.  History of Present Illness    Victoria Williamson is a 59 y.o. female who presents via audio/video conferencing for a telehealth visit today.  Pt was last seen in cardiology clinic on 04/09/2024 by Dr. Cave.  At that time Victoria Williamson was doing well.  The patient is now pending procedure as outlined above. Since her last visit, She denies chest pain, dyspnea, orthopnea, n, v, dark/tarry/bloody stools, hematuria, dizziness, syncope, edema, weight gain.  Reports chronic intermittent palpitations that have been present since her ablation, no associated symptoms, occurring maybe once every two months, overall not bothersome to her.   Past Medical History    Past Medical History:  Diagnosis Date   Anticoagulant long-term use     eliquis --- managed by cardiology   Arthritis    Ascending aortic aneurysm 05/2022   had surgical consult 05-21-2022 w/ wayne gold PA ,   4.0 cm per CT 05-21-2022 in epic, active survillance   Chronic low back pain    DDD (degenerative disc disease), lumbar    Diastolic CHF, chronic (HCC)    followed by cardiology;    last echo 07-27-2021 ef 60-65%   DOE (dyspnea on exertion)    Dysautonomia (HCC) 12/18/2013   dx by cardiologist--- dr fernande   [phone]  pt stated no issues in passed few yrs)   Edema of both lower extremities    intermittant per pt   Fatty liver    GAD (generalized anxiety disorder)    History of adenomatous polyp of colon    Hx of skin cancer, basal cell    Hypertension    MDD (major depressive disorder)    PAF (paroxysmal atrial fibrillation) (HCC) 09/2019   cardiologist-  dr b. agbor-etang/  EP cardiologist-- dr cindie   PMB (postmenopausal bleeding)    PONV (postoperative nausea and vomiting)    Pre-diabetes    PSVT (paroxysmal supraventricular tachycardia)    Pulmonary emphysema (HCC)    followed by pcp   Pulmonary nodule    followed by pcp   Spondylolisthesis of lumbar region    Past Surgical History:  Procedure Laterality Date   ATRIAL FIBRILLATION ABLATION N/A 07/15/2020   Procedure: ATRIAL FIBRILLATION ABLATION;  Surgeon: Cindie Ole DASEN, MD;  [facility]: [facility];  Service: Cardiovascular;  Laterality: N/A;   ATRIAL FIBRILLATION ABLATION N/A 11/29/2023   Procedure: ATRIAL FIBRILLATION ABLATION;  Surgeon: Cindie Ole DASEN, MD;  Location: [facility] CV [facility];  Service: Cardiovascular;  Laterality: N/A;   BREAST BIOPSY Right 07/17/2023   MM RT BREAST BX W LOC DEV 1ST LESION IMAGE BX SPEC STEREO GUIDE 07/17/2023 [facility]   CARDIAC CATHETERIZATION  06/03/2013   @[facility]  by dr bosie;  per pt was told normal coronary arteries   CARDIOVERSION N/A 05/11/2020   Procedure: CARDIOVERSION;  Surgeon: Darliss Rogue, MD;  Location: [facility]  ORS;  Service: Cardiovascular;  Laterality: N/A;   CARPAL TUNNEL RELEASE Left 06/23/2020   Procedure: CARPAL TUNNEL RELEASE;  Surgeon: Murrell Kuba, MD;  Location: [facility];  Service: Orthopedics;  Laterality: Left;  IV REGIONAL FOREARM BLOCK   CARPAL TUNNEL RELEASE Right 2000   COLONOSCOPY  2020   HYSTEROSCOPY W/ ENDOMETRIAL ABLATION  02/23/2005   @[facility] ;  w/ novasure   HYSTEROSCOPY WITH D & C N/A 07/24/2022   Procedure: ATTEMPTED DILATATION AND CURETTAGE /HYSTEROSCOPY;  Surgeon: Cleotilde Ronal RAMAN, MD;  Location: [facility];  Service: Gynecology;  Laterality: N/A;   OPERATIVE ULTRASOUND N/A 07/24/2022   Procedure: OPERATIVE ULTRASOUND;  Surgeon: Cleotilde Ronal RAMAN, MD;  Location: [facility];  Service: Gynecology;  Laterality: N/A;   REVISION AMPUTATION OF FINGER Left 12/17/2000   @[facility]  by dr sypher;  injury  left little finger   TUBAL LIGATION Bilateral    yrs ago    Allergies  Allergies[1]  Home Medications    Prior to Admission medications  Medication Sig Start Date End Date Taking? Authorizing Provider  apixaban  (ELIQUIS ) 5 MG TABS tablet Take 1 tablet (5 mg total) by mouth 2 (two) times daily. 04/09/24   Darliss Rogue, MD  ARIPiprazole  (ABILIFY ) 5 MG tablet Take 5 mg by mouth daily.    [provider]  clobetasol  ointment (TEMOVATE ) 0.05 % Apply to affected area every night for 4 weeks, then every other day for 4 weeks and then twice a week for 4 weeks or until resolution. 04/16/22   Eldonna Suzen Octave, MD  fluticasone  (FLONASE ) 50 MCG/ACT nasal spray Place 1 spray into both nostrils daily as needed for allergies. 11/05/23   [provider]  loratadine  (CLARITIN ) 10 MG tablet Take 10 mg by mouth daily as needed for allergies. 11/06/23   [provider]  metoprolol  succinate (TOPROL -XL) 100 MG 24 hr tablet Take 1 tablet (100 mg total) by mouth daily. Take with or immediately following a meal. 04/09/24   Darliss Rogue, MD  mirtazapine  (REMERON ) 15 MG tablet Take 1 tablet (15 mg total) by mouth at bedtime. 07/08/24   [name], NP  pantoprazole  (PROTONIX ) 40 MG tablet Take 1 tablet (40 mg total) by mouth daily. 04/09/24   [name], NP    Physical Exam    Vital Signs:  Victoria Williamson does not have vital signs available for review today.  Given telephonic nature of communication, physical exam is limited. AAOx3. NAD. Normal affect.  Speech and respirations are unlabored.  Accessory Clinical Findings    None  Assessment & Plan    1.  Preoperative Cardiovascular Risk Assessment: According to the Revised Cardiac Risk Index (RCRI), her Perioperative Risk of Major Cardiac Event is (%): 0.9  Her Functional Capacity in METs is: 5.07 according to the Duke Activity Status Index (DASI). Therefore, based on ACC/AHA guidelines, patient would be at acceptable risk for the planned procedure without further cardiovascular testing.  The patient was advised that if she develops  new symptoms prior to surgery to contact our office to arrange for a follow-up visit, and she verbalized understanding.  Per office protocol, patient can hold Eliquis  for 2 days prior to procedure   A copy of this note will be routed to requesting surgeon.  Time:   Today, I have spent 10 minutes with the patient with telehealth technology discussing medical history, symptoms, and management plan.     Sumire Halbleib E Manpreet Strey, NP  08/07/2024, 8:45 AM     [1]  Allergies Allergen Reactions   Codeine Nausea And Vomiting    Confusion and could not stay awake   Meloxicam Nausea Only    And stomach cramping   Mushroom Diarrhea   Paroxetine Hcl Other (See Comments)    Sleep all the time   "

## 2024-08-07 NOTE — Addendum Note (Signed)
 Addended by: [name] on: 08/07/2024 10:00 AM   Modules accepted: Orders

## 2024-08-10 ENCOUNTER — Encounter: Payer: Self-pay | Admitting: Nurse Practitioner

## 2024-08-10 ENCOUNTER — Telehealth: Payer: Self-pay

## 2024-08-10 NOTE — Telephone Encounter (Signed)
 Patient is able to hold her Eliquis  2 days before her procedure and restart the next day. Patient stated that she already knew that. Patient agreed and understood.

## 2024-08-21 ENCOUNTER — Encounter: Payer: Self-pay | Admitting: Family Medicine

## 2024-08-21 ENCOUNTER — Ambulatory Visit: Admitting: Family Medicine

## 2024-08-21 VITALS — BP 129/78 | HR 68 | Wt 242.0 lb

## 2024-08-21 DIAGNOSIS — A630 Anogenital (venereal) warts: Secondary | ICD-10-CM

## 2024-08-21 DIAGNOSIS — L900 Lichen sclerosus et atrophicus: Secondary | ICD-10-CM

## 2024-08-21 DIAGNOSIS — N393 Stress incontinence (female) (male): Secondary | ICD-10-CM | POA: Diagnosis not present

## 2024-08-21 MED ORDER — CLOBETASOL PROPIONATE 0.05 % EX OINT: TOPICAL_OINTMENT | CUTANEOUS | 5 refills | Status: AC

## 2024-08-21 NOTE — Progress Notes (Signed)
" ° °  GYNECOLOGY PROBLEM  VISIT ENCOUNTER NOTE  Subjective:   Victoria Williamson is a 59 y.o. [phone] female here for a problem GYN visit.  Current complaints: vaginal itching clobetasol  helped but her tub expired and noted poor relief when used.   Denies abnormal vaginal bleeding, discharge, pelvic pain, problems with intercourse or other gynecologic concerns.    Gynecologic History No LMP recorded. Patient is postmenopausal.  Contraception: post menopausal status  Health Maintenance Due  Topic Date Due   Hepatitis B Vaccines 19-59 Average Risk (1 of 3 - 19+ 3-dose series) Never done   Zoster Vaccines- Shingrix (1 of 2) Never done   COVID-19 Vaccine (3 - Pfizer risk series) 04/30/2020    The following portions of the patient's history were reviewed and updated as appropriate: allergies, current medications, past family history, past medical history, past social history, past surgical history and problem list.  Review of Systems Pertinent items are noted in HPI.   Objective:  BP 129/78   Pulse 68   Wt 242 lb (109.8 kg)   BMI 41.54 kg/m   Gen: well appearing, NAD HEENT: no scleral icterus CV: RR Lung: Normal WOB Ext: warm well perfused  PELVIC: Normal appearing external genitalia except for HPV appearing changes on left labia. Present for years per patient-- more inflammed currently but looks somewhat verrucal. Notes more itching on L>R; normal appearing vaginal mucosa and cervix.  No abnormal discharge noted.  Pap smear obtained.  Normal uterine size, no other palpable masses, no uterine or adnexal tenderness.   Assessment and Plan:  1. Lichen sclerosus (Primary) Start treatment  Repeat exam in 2 month - clobetasol  ointment (TEMOVATE ) 0.05 %; Apply to affected area every night for 4 weeks, then every other day for 4 weeks and then twice a week for 4 weeks or until resolution.  Dispense: 30 g; Refill: 5  2. Stress incontinence of urine - Ambulatory referral to Physical  Therapy  3. Genital warts More prominent this exam Reports she has never seen before but has not noted any change  My last exam was in 2023 Treat with above and repeat exam Discussed possibly needing bx     Please refer to After Visit Summary for other counseling recommendations.   No follow-ups on file.  Suzen Maryan Masters, MD, MPH, ABFM Attending Physician Faculty Practice- [facility]  "

## 2024-08-21 NOTE — Progress Notes (Signed)
 CC: Vaginal itching, needing refill of clobetasol  ointment

## 2024-08-23 ENCOUNTER — Encounter: Payer: Self-pay | Admitting: Family Medicine

## 2024-08-24 ENCOUNTER — Encounter: Payer: Self-pay | Admitting: Nurse Practitioner

## 2024-08-27 ENCOUNTER — Ambulatory Visit: Admitting: Anesthesiology

## 2024-08-27 ENCOUNTER — Encounter: Payer: Self-pay | Admitting: Gastroenterology

## 2024-08-27 ENCOUNTER — Encounter
Admission: RE | Disposition: A | Discharge status: Home / Self Care | Payer: Self-pay | Source: Home / Self Care | Attending: Gastroenterology

## 2024-08-27 ENCOUNTER — Ambulatory Visit
Admission: RE | Admit: 2024-08-27 | Discharge: 2024-08-27 | Disposition: A | Discharge status: Home / Self Care | Attending: Gastroenterology | Admitting: Gastroenterology

## 2024-08-27 ENCOUNTER — Other Ambulatory Visit: Payer: Self-pay

## 2024-08-27 DIAGNOSIS — I471 Supraventricular tachycardia, unspecified: Secondary | ICD-10-CM | POA: Insufficient documentation

## 2024-08-27 DIAGNOSIS — I5032 Chronic diastolic (congestive) heart failure: Secondary | ICD-10-CM | POA: Insufficient documentation

## 2024-08-27 DIAGNOSIS — Z8249 Family history of ischemic heart disease and other diseases of the circulatory system: Secondary | ICD-10-CM | POA: Insufficient documentation

## 2024-08-27 DIAGNOSIS — E66813 Obesity, class 3: Secondary | ICD-10-CM | POA: Insufficient documentation

## 2024-08-27 DIAGNOSIS — Z1211 Encounter for screening for malignant neoplasm of colon: Secondary | ICD-10-CM | POA: Insufficient documentation

## 2024-08-27 DIAGNOSIS — Z7901 Long term (current) use of anticoagulants: Secondary | ICD-10-CM | POA: Insufficient documentation

## 2024-08-27 DIAGNOSIS — K640 First degree hemorrhoids: Secondary | ICD-10-CM | POA: Insufficient documentation

## 2024-08-27 DIAGNOSIS — Z860101 Personal history of adenomatous and serrated colon polyps: Secondary | ICD-10-CM | POA: Diagnosis not present

## 2024-08-27 DIAGNOSIS — Z79899 Other long term (current) drug therapy: Secondary | ICD-10-CM | POA: Insufficient documentation

## 2024-08-27 DIAGNOSIS — I480 Paroxysmal atrial fibrillation: Secondary | ICD-10-CM | POA: Diagnosis not present

## 2024-08-27 DIAGNOSIS — I110 Hypertensive heart disease with heart failure: Secondary | ICD-10-CM | POA: Insufficient documentation

## 2024-08-27 DIAGNOSIS — F411 Generalized anxiety disorder: Secondary | ICD-10-CM | POA: Diagnosis not present

## 2024-08-27 DIAGNOSIS — K219 Gastro-esophageal reflux disease without esophagitis: Secondary | ICD-10-CM | POA: Insufficient documentation

## 2024-08-27 DIAGNOSIS — Z818 Family history of other mental and behavioral disorders: Secondary | ICD-10-CM | POA: Diagnosis not present

## 2024-08-27 DIAGNOSIS — J439 Emphysema, unspecified: Secondary | ICD-10-CM | POA: Insufficient documentation

## 2024-08-27 DIAGNOSIS — D123 Benign neoplasm of transverse colon: Secondary | ICD-10-CM | POA: Insufficient documentation

## 2024-08-27 DIAGNOSIS — G709 Myoneural disorder, unspecified: Secondary | ICD-10-CM | POA: Insufficient documentation

## 2024-08-27 DIAGNOSIS — Z6841 Body Mass Index (BMI) 40.0 and over, adult: Secondary | ICD-10-CM | POA: Insufficient documentation

## 2024-08-27 DIAGNOSIS — Z87891 Personal history of nicotine dependence: Secondary | ICD-10-CM | POA: Diagnosis not present

## 2024-08-27 DIAGNOSIS — D122 Benign neoplasm of ascending colon: Secondary | ICD-10-CM | POA: Insufficient documentation

## 2024-08-27 DIAGNOSIS — Z825 Family history of asthma and other chronic lower respiratory diseases: Secondary | ICD-10-CM | POA: Insufficient documentation

## 2024-08-27 DIAGNOSIS — K635 Polyp of colon: Secondary | ICD-10-CM

## 2024-08-27 DIAGNOSIS — D124 Benign neoplasm of descending colon: Secondary | ICD-10-CM | POA: Insufficient documentation

## 2024-08-27 DIAGNOSIS — F319 Bipolar disorder, unspecified: Secondary | ICD-10-CM | POA: Insufficient documentation

## 2024-08-27 DIAGNOSIS — Z860100 Personal history of colon polyps, unspecified: Secondary | ICD-10-CM

## 2024-08-27 HISTORY — PX: COLONOSCOPY: SHX5424

## 2024-08-27 HISTORY — PX: POLYPECTOMY: SHX149

## 2024-08-27 MED ORDER — SODIUM CHLORIDE 0.9 % IV SOLN: INTRAVENOUS | Status: DC

## 2024-08-27 MED ORDER — PROPOFOL 500 MG/50ML IV EMUL
INTRAVENOUS | Status: DC | PRN
  Administered 2024-08-27: 150 ug/kg/min via INTRAVENOUS

## 2024-08-27 MED ORDER — PROPOFOL 10 MG/ML IV BOLUS
INTRAVENOUS | Status: DC | PRN
  Administered 2024-08-27: 100 mg via INTRAVENOUS

## 2024-08-27 MED ORDER — LIDOCAINE 2% (20 MG/ML) 5 ML SYRINGE
INTRAMUSCULAR | Status: DC | PRN
  Administered 2024-08-27: 50 mg via INTRAVENOUS

## 2024-08-27 NOTE — Transfer of Care (Signed)
 Immediate Anesthesia Transfer of Care Note  Patient: Victoria Williamson  Procedure(s) Performed: COLONOSCOPY POLYPECTOMY, INTESTINE  Patient Location: Endoscopy Unit  Anesthesia Type:General  Level of Consciousness: drowsy  Airway & Oxygen Therapy: Patient Spontanous Breathing  Post-op Assessment: Report given to RN  Post vital signs: stable  Last Vitals:  Vitals Value Taken Time  BP    Temp    Pulse    Resp    SpO2      Last Pain:  Vitals:   08/27/24 0928  TempSrc: Temporal  PainSc: 0-No pain         Complications: No notable events documented.

## 2024-08-27 NOTE — Anesthesia Postprocedure Evaluation (Signed)
"   Anesthesia Post Note  Patient: Sheresa Damira Kem  Procedure(s) Performed: COLONOSCOPY POLYPECTOMY, INTESTINE  Patient location during evaluation: Endoscopy Anesthesia Type: General Level of consciousness: awake and alert Pain management: pain level controlled Vital Signs Assessment: post-procedure vital signs reviewed and stable Respiratory status: spontaneous breathing, nonlabored ventilation, respiratory function stable and patient connected to nasal cannula oxygen Cardiovascular status: blood pressure returned to baseline and stable Postop Assessment: no apparent nausea or vomiting Anesthetic complications: no   No notable events documented.   Last Vitals:  Vitals:   08/27/24 0928 08/27/24 1007  BP: (!) 142/85 104/69  Pulse: 69 75  Resp: 16 17  Temp: (!) 36.4 C (!) 36.3 C  SpO2: 98% 94%    Last Pain:  Vitals:   08/27/24 1007  TempSrc: Temporal  PainSc:                  Lendia LITTIE Mae      "

## 2024-08-27 NOTE — H&P (Signed)
 "  Rogelia Copping, MD [facility] [address] [place], [place] [address] Phone:[phone] Fax : [phone]  Primary Care Physician:  [name], NP Primary Gastroenterologist:  Dr. Copping  Pre-Procedure History & Physical: HPI:  Danessa Mylissa Lambe is a 59 y.o. female is here for an colonoscopy.   Past Medical History:  Diagnosis Date   Anticoagulant long-term use    eliquis --- managed by cardiology   Arthritis    Ascending aortic aneurysm 05/2022   had surgical consult 05-21-2022 w/ wayne gold PA ,   4.0 cm per CT 05-21-2022 in epic, active survillance   Chronic low back pain    DDD (degenerative disc disease), lumbar    Diastolic CHF, chronic (HCC)    followed by cardiology;    last echo 07-27-2021 ef 60-65%   DOE (dyspnea on exertion)    Dysautonomia (HCC) 12/18/2013   dx by cardiologist--- dr fernande   [phone]  pt stated no issues in passed few yrs)   Edema of both lower extremities    intermittant per pt   Fatty liver    GAD (generalized anxiety disorder)    History of adenomatous polyp of colon    Hx of skin cancer, basal cell    Hypertension    MDD (major depressive disorder)    PAF (paroxysmal atrial fibrillation) (HCC) 09/2019   cardiologist-  dr b. agbor-etang/  EP cardiologist-- dr cindie   PMB (postmenopausal bleeding)    PONV (postoperative nausea and vomiting)    Pre-diabetes    PSVT (paroxysmal supraventricular tachycardia)    Pulmonary emphysema (HCC)    followed by pcp   Pulmonary nodule    followed by pcp   Spondylolisthesis of lumbar region     Past Surgical History:  Procedure Laterality Date   ATRIAL FIBRILLATION ABLATION N/A 07/15/2020   Procedure: ATRIAL FIBRILLATION ABLATION;  Surgeon: Cindie Ole DASEN, MD;  Location: [facility];  Service: Cardiovascular;  Laterality: N/A;   ATRIAL FIBRILLATION ABLATION N/A 11/29/2023   Procedure: ATRIAL FIBRILLATION ABLATION;  Surgeon: Cindie Ole DASEN, MD;  Location: [facility];  Service: Cardiovascular;  Laterality: N/A;   BREAST BIOPSY Right 07/17/2023   MM RT BREAST BX W LOC DEV 1ST LESION IMAGE BX SPEC STEREO GUIDE 07/17/2023 [facility]   CARDIAC CATHETERIZATION  06/03/2013   @[facility]  by dr bosie;  per pt was told normal coronary arteries   CARDIOVERSION N/A 05/11/2020   Procedure: CARDIOVERSION;  Surgeon: Darliss Rogue, MD;  Location: [facility] ORS;  Service: Cardiovascular;  Laterality: N/A;   CARPAL TUNNEL RELEASE Left 06/23/2020   Procedure: CARPAL TUNNEL RELEASE;  Surgeon: Murrell Kuba, MD;  Location: [facility];  Service: Orthopedics;  Laterality: Left;  IV REGIONAL FOREARM BLOCK   CARPAL TUNNEL RELEASE Right 2000   COLONOSCOPY  2020   HYSTEROSCOPY W/ ENDOMETRIAL ABLATION  02/23/2005   @[facility] ;  w/ novasure   HYSTEROSCOPY WITH D & C N/A 07/24/2022   Procedure: ATTEMPTED DILATATION AND CURETTAGE /HYSTEROSCOPY;  Surgeon: Cleotilde Ronal RAMAN, MD;  Location: [facility];  Service: Gynecology;  Laterality: N/A;   OPERATIVE ULTRASOUND N/A 07/24/2022   Procedure: OPERATIVE ULTRASOUND;  Surgeon: Cleotilde Ronal RAMAN, MD;  Location: [facility];  Service: Gynecology;  Laterality: N/A;   REVISION AMPUTATION OF FINGER Left 12/17/2000   @[facility]  by dr sypher;  injury  left little finger   TUBAL LIGATION Bilateral    yrs ago    Prior to Admission  medications  Medication Sig Start Date End Date Taking? Authorizing Provider  ARIPiprazole  (ABILIFY ) 5 MG tablet Take 5 mg by mouth daily.   Yes [provider]  fluticasone  (FLONASE ) 50 MCG/ACT nasal spray Place 1 spray into both nostrils daily as needed for allergies. 11/05/23  Yes [provider]  loratadine  (CLARITIN ) 10 MG tablet Take 10 mg by mouth daily as needed for allergies. 11/06/23  Yes [provider]  metoprolol  succinate (TOPROL -XL) 100 MG 24 hr tablet Take 1 tablet (100 mg total) by mouth daily. Take with or immediately following a meal. 04/09/24  Yes  Agbor-Etang, Redell, MD  pantoprazole  (PROTONIX ) 40 MG tablet Take 1 tablet (40 mg total) by mouth daily. 04/09/24  Yes [name], NP  apixaban  (ELIQUIS ) 5 MG TABS tablet Take 1 tablet (5 mg total) by mouth 2 (two) times daily. 04/09/24   Darliss Redell, MD  clobetasol  ointment (TEMOVATE ) 0.05 % Apply to affected area every night for 4 weeks, then every other day for 4 weeks and then twice a week for 4 weeks or until resolution. 08/21/24   Eldonna Suzen Octave, MD  mirtazapine  (REMERON ) 15 MG tablet Take 1 tablet (15 mg total) by mouth at bedtime. 07/08/24   [name], NP    Allergies as of 06/18/2024 - Review Complete 06/18/2024  Allergen Reaction Noted   Codeine Nausea And Vomiting 10/28/2013   Meloxicam Nausea Only 03/17/2021   Mushroom Diarrhea 11/21/2023   Paroxetine hcl Other (See Comments) 10/28/2013    Family History  Problem Relation Age of Onset   Heart disease Mother    Diabetes Mother    COPD Mother    Hypertension Mother    Arthritis Mother    High Cholesterol Mother    Kidney disease Mother    Cancer Father 71       kidney cancer with removal   Heart attack Father    Alcohol abuse Father    Hearing loss Father    Heart disease Father    High Cholesterol Father    Hypertension Father    Heart attack Brother 35       heart attack that caused his death   Breast cancer Maternal Aunt 64   Breast cancer Maternal Grandmother 47   Depression Paternal Grandmother    Heart attack Paternal Grandfather    Heart disease Paternal Grandfather    Diabetes Other        family HX, unspecified    Social History   Socioeconomic History   Marital status: Married    Spouse name: Lamar   Number of children: 2   Years of education: Not on file   Highest education level: 12th grade  Occupational History   Not on file  Tobacco Use   Smoking status: Former    Current packs/day: 0.00    Average packs/day: 1 pack/day for 37.0 years (37.0 ttl pk-yrs)    Types:  Cigarettes    Start date: 12/29/1982    Quit date: 12/29/2019    Years since quitting: 4.6   Smokeless tobacco: Never  Vaping Use   Vaping status: Former   Substances: Nicotine   Devices: juul  Substance and Sexual Activity   Alcohol use: Not Currently   Drug use: Never   Sexual activity: Yes    Partners: Male    Birth control/protection: Post-menopausal  Other Topics Concern   Not on file  Social History Narrative   Lives at home with husband  Fulltime: Designer, industrial/product   Social Drivers of Health   Tobacco Use: Medium Risk (08/27/2024)   Patient History    Smoking Tobacco Use: Former    Smokeless Tobacco Use: Never    Passive Exposure: Not on file  Financial Resource Strain: Low Risk (05/31/2024)   Overall Financial Resource Strain (CARDIA)    Difficulty of Paying Living Expenses: Not hard at all  Food Insecurity: No Food Insecurity (05/31/2024)   Epic    Worried About Programme Researcher, Broadcasting/film/video in the Last Year: Never true    Ran Out of Food in the Last Year: Never true  Transportation Needs: No Transportation Needs (05/31/2024)   Epic    Lack of Transportation (Medical): No    Lack of Transportation (Non-Medical): No  Physical Activity: Insufficiently Active (05/31/2024)   Exercise Vital Sign    Days of Exercise per Week: 2 days    Minutes of Exercise per Session: 30 min  Stress: Stress Concern Present (05/31/2024)   [employer] of Occupational Health - Occupational Stress Questionnaire    Feeling of Stress: To some extent  Social Connections: Socially Integrated (05/31/2024)   Social Connection and Isolation Panel    Frequency of Communication with Friends and Family: More than three times a week    Frequency of Social Gatherings with Friends and Family: Three times a week    Attends Religious Services: More than 4 times per year    Active Member of Clubs or Organizations: Yes    Attends Banker Meetings: More than 4 times per year    Marital Status:  Married  Catering Manager Violence: Not At Risk (11/29/2023)   Humiliation, Afraid, Rape, and Kick questionnaire    Fear of Current or Ex-Partner: No    Emotionally Abused: No    Physically Abused: No    Sexually Abused: No  Depression (PHQ2-9): Medium Risk (06/17/2024)   Depression (PHQ2-9)    PHQ-2 Score: 5  Alcohol Screen: Not on file  Housing: Low Risk (05/31/2024)   Epic    Unable to Pay for Housing in the Last Year: No    Number of Times Moved in the Last Year: 0    Homeless in the Last Year: No  Utilities: Not At Risk (11/29/2023)   [facility] Utilities    Threatened with loss of utilities: No  Health Literacy: Not on file    Review of Systems: See HPI, otherwise negative ROS  Physical Exam: BP (!) 142/85   Pulse 69   Temp (!) 97.5 F (36.4 C) (Temporal)   Resp 16   Ht 5' 4 (1.626 m)   Wt 107.4 kg   SpO2 98%   BMI 40.65 kg/m  General:   Alert,  pleasant and cooperative in NAD Head:  Normocephalic and atraumatic. Neck:  Supple; no masses or thyromegaly. Lungs:  Clear throughout to auscultation.    Heart:  Regular rate and rhythm. Abdomen:  Soft, nontender and nondistended. Normal bowel sounds, without guarding, and without rebound.   Neurologic:  Alert and  oriented x4;  grossly normal neurologically.  Impression/Plan: Cloee Alisa Ada is here for an colonoscopy to be performed for a history of adenomatous polyps on 2020   Risks, benefits, limitations, and alternatives regarding  colonoscopy have been reviewed with the patient.  Questions have been answered.  All parties agreeable.   Rogelia Copping, MD  08/27/2024, 9:38 AM "

## 2024-08-27 NOTE — Op Note (Signed)
 [facility] Gastroenterology Patient Name: Victoria Williamson Procedure Date: 08/27/2024 9:36 AM MRN: [id number] Account #: [id number] Date of Birth: [birth year] Admit Type: Outpatient Age: 59 Room: [facility] ENDO ROOM 4 Gender: Female Note Status: Finalized Instrument Name: Colon Scope [id number] Procedure:             Colonoscopy Indications:           High risk colon cancer surveillance: Personal history                         of colonic polyps Providers:             Rogelia Copping MD, MD Referring MD:          Lynwood HERO. Cable (Referring MD) Medicines:             Propofol  per Anesthesia Complications:         No immediate complications. Procedure:             Pre-Anesthesia Assessment:                        - Prior to the procedure, a History and Physical was                         performed, and patient medications and allergies were                         reviewed. The patient's tolerance of previous                         anesthesia was also reviewed. The risks and benefits                         of the procedure and the sedation options and risks                         were discussed with the patient. All questions were                         answered, and informed consent was obtained. Prior                         Anticoagulants: The patient has taken no anticoagulant                         or antiplatelet agents. ASA Grade Assessment: II - A                         patient with mild systemic disease. After reviewing                         the risks and benefits, the patient was deemed in                         satisfactory condition to undergo the procedure.                        After obtaining informed consent, the colonoscope was  passed under direct vision. Throughout the procedure,                         the patient's blood pressure, pulse, and oxygen                         saturations were monitored continuously. The                          Colonoscope was introduced through the anus and                         advanced to the the cecum, identified by appendiceal                         orifice and ileocecal valve. The colonoscopy was                         performed without difficulty. The patient tolerated                         the procedure well. The quality of the bowel                         preparation was excellent. Findings:      The perianal and digital rectal examinations were normal.      A 5 mm polyp was found in the ascending colon. The polyp was sessile.       The polyp was removed with a cold snare. Resection and retrieval were       complete.      Four sessile polyps were found in the transverse colon. The polyps were       3 to 5 mm in size. These polyps were removed with a cold snare.       Resection and retrieval were complete.      A 4 mm polyp was found in the descending colon. The polyp was sessile.       The polyp was removed with a cold snare. Resection and retrieval were       complete.      Non-bleeding internal hemorrhoids were found during retroflexion. The       hemorrhoids were Grade I (internal hemorrhoids that do not prolapse). Impression:            - One 5 mm polyp in the ascending colon, removed with                         a cold snare. Resected and retrieved.                        - Four 3 to 5 mm polyps in the transverse colon,                         removed with a cold snare. Resected and retrieved.                        - One 4 mm polyp in the descending colon, removed with  a cold snare. Resected and retrieved.                        - Non-bleeding internal hemorrhoids. Recommendation:        - Discharge patient to home.                        - Resume previous diet.                        - Continue present medications.                        - Await pathology results.                        - Repeat colonoscopy in 3 years for  surveillance. Procedure Code(s):     --- Professional ---                        [phone], Colonoscopy, flexible; with removal of                         tumor(s), polyp(s), or other lesion(s) by snare                         technique Diagnosis Code(s):     --- Professional ---                        Z86.010, Personal history of colonic polyps                        D12.4, Benign neoplasm of descending colon CPT copyright 2022 American Medical Association. All rights reserved. The codes documented in this report are preliminary and upon coder review may  be revised to meet current compliance requirements. Rogelia Copping MD, MD 08/27/2024 10:03:19 AM This report has been signed electronically. Number of Addenda: 0 Note Initiated On: 08/27/2024 9:36 AM Scope Withdrawal Time: 0 hours 6 minutes 14 seconds  Total Procedure Duration: 0 hours 11 minutes 38 seconds  Estimated Blood Loss:  Estimated blood loss: none.      [facility]

## 2024-08-27 NOTE — Anesthesia Preprocedure Evaluation (Addendum)
 "                                  Anesthesia Evaluation  Patient identified by MRN, date of birth, ID band Patient awake    Reviewed: Allergy & Precautions, NPO status , Patient's Chart, lab work & pertinent test results  History of Anesthesia Complications (+) PONV and history of anesthetic complications  Airway Mallampati: III  TM Distance: >3 FB Neck ROM: full    Dental no notable dental hx.    Pulmonary shortness of breath and with exertion, COPD,  COPD inhaler, former smoker   Pulmonary exam normal        Cardiovascular hypertension, On Medications +CHF  + dysrhythmias Atrial Fibrillation and Supra Ventricular Tachycardia   Echo  IMPRESSIONS     1. Left ventricular ejection fraction, by estimation, is 65 to 70%. The  left ventricle has normal function. The left ventricle has no regional  wall motion abnormalities. Left ventricular diastolic parameters are  consistent with Grade II diastolic  dysfunction (pseudonormalization). The average left ventricular global  longitudinal strain is -19.3 %. The global longitudinal strain is normal.   2. Right ventricular systolic function is normal. The right ventricular  size is normal. There is normal pulmonary artery systolic pressure. The  estimated right ventricular systolic pressure is 20.2 mmHg.   3. Left atrial size was mildly dilated.   4. The mitral valve is normal in structure. Mild mitral valve  regurgitation. No evidence of mitral stenosis.   5. The aortic valve is normal in structure. Aortic valve regurgitation is  not visualized. No aortic stenosis is present.   6. The inferior vena cava is normal in size with greater than 50%  respiratory variability, suggesting right atrial pressure of 3 mmHg.     Neuro/Psych  PSYCHIATRIC DISORDERS Anxiety Depression Bipolar Disorder    Neuromuscular disease    GI/Hepatic Neg liver ROS,GERD  Medicated,,  Endo/Other    Class 3 obesity  Renal/GU negative Renal  ROS  negative genitourinary   Musculoskeletal   Abdominal   Peds  Hematology negative hematology ROS (+)   Anesthesia Other Findings Past Medical History: No date: Anticoagulant long-term use     Comment:  eliquis --- managed by cardiology No date: Arthritis 05/2022: Ascending aortic aneurysm     Comment:  had surgical consult 05-21-2022 w/ wayne gold PA ,   4.0              cm per CT 05-21-2022 in epic, active survillance No date: Chronic low back pain No date: DDD (degenerative disc disease), lumbar No date: Diastolic CHF, chronic (HCC)     Comment:  followed by cardiology;    last echo 07-27-2021 ef               60-65% No date: DOE (dyspnea on exertion) 12/18/2013: Dysautonomia (HCC)     Comment:  dx by cardiologist--- dr fernande   [phone]  pt stated               no issues in passed few yrs) No date: Edema of both lower extremities     Comment:  intermittant per pt No date: Fatty liver No date: GAD (generalized anxiety disorder) No date: History of adenomatous polyp of colon No date: Hx of skin cancer, basal cell No date: Hypertension No date: MDD (major depressive disorder) 09/2019: PAF (paroxysmal atrial fibrillation) (HCC)  Comment:  cardiologist-  dr b. agbor-etang/  EP cardiologist-- dr               cindie No date: PMB (postmenopausal bleeding) No date: PONV (postoperative nausea and vomiting) No date: Pre-diabetes No date: PSVT (paroxysmal supraventricular tachycardia) No date: Pulmonary emphysema (HCC)     Comment:  followed by pcp No date: Pulmonary nodule     Comment:  followed by pcp No date: Spondylolisthesis of lumbar region  Past Surgical History: 07/15/2020: ATRIAL FIBRILLATION ABLATION; N/A     Comment:  Procedure: ATRIAL FIBRILLATION ABLATION;  Surgeon:               Cindie Ole DASEN, MD;  Location: [facility];                Service: Cardiovascular;  Laterality: N/A; 11/29/2023: ATRIAL FIBRILLATION ABLATION; N/A     Comment:   Procedure: ATRIAL FIBRILLATION ABLATION;  Surgeon:               Cindie Ole DASEN, MD;  Location: [facility];                Service: Cardiovascular;  Laterality: N/A; 07/17/2023: BREAST BIOPSY; Right     Comment:  MM RT BREAST BX W LOC DEV 1ST LESION IMAGE BX SPEC               STEREO GUIDE 07/17/2023 [facility] MAMMOGRAPHY 06/03/2013: CARDIAC CATHETERIZATION     Comment:  @[facility]  by dr bosie;  per pt was told normal coronary               arteries 05/11/2020: CARDIOVERSION; N/A     Comment:  Procedure: CARDIOVERSION;  Surgeon: Darliss Rogue,               MD;  Location: [facility] OR[facility];  Service: Cardiovascular;                Laterality: N/A; 06/23/2020: CARPAL TUNNEL RELEASE; Left     Comment:  Procedure: CARPAL TUNNEL RELEASE;  Surgeon: Murrell Kuba,              MD;  Location: [facility];  Service:               Orthopedics;  Laterality: Left;  [facility]               BLOCK 2000: CARPAL TUNNEL RELEASE; Right 2020: COLONOSCOPY 02/23/2005: HYSTEROSCOPY W/ ENDOMETRIAL ABLATION     Comment:  @[facility] ;  w/ novasure 07/24/2022: HYSTEROSCOPY WITH D & C; N/A     Comment:  Procedure: ATTEMPTED DILATATION AND CURETTAGE               /HYSTEROSCOPY;  Surgeon: Cleotilde Ronal RAMAN, MD;  Location:               [facility] [address];  Service: Gynecology;                Laterality: N/A; 07/24/2022: OPERATIVE ULTRASOUND; N/A     Comment:  Procedure: OPERATIVE ULTRASOUND;  Surgeon: Cleotilde Ronal RAMAN, MD;  Location: [facility];  Service:               Gynecology;  Laterality: N/A; 12/17/2000: REVISION AMPUTATION OF FINGER; Left     Comment:  @[facility]  by dr sypher;  injury  left little finger No date: TUBAL  LIGATION; Bilateral     Comment:  yrs ago     Reproductive/Obstetrics negative OB ROS                              Anesthesia Physical Anesthesia Plan  ASA: 3  Anesthesia Plan: General   Post-op Pain Management:  Minimal or no pain anticipated   Induction: Intravenous  PONV Risk Score and Plan: 3 and Propofol  infusion and TIVA  Airway Management Planned: Natural Airway and Nasal Cannula  Additional Equipment:   Intra-op Plan:   Post-operative Plan:   Informed Consent: I have reviewed the patients History and Physical, chart, labs and discussed the procedure including the risks, benefits and alternatives for the proposed anesthesia with the patient or authorized representative who has indicated his/her understanding and acceptance.     Dental Advisory Given  Plan Discussed with: Anesthesiologist, CRNA and Surgeon  Anesthesia Plan Comments: (Patient consented for risks of anesthesia including but not limited to:  - adverse reactions to medications - risk of airway placement if required - damage to eyes, teeth, lips or other oral mucosa - nerve damage due to positioning  - sore throat or hoarseness - Damage to heart, brain, nerves, lungs, other parts of body or loss of life  Patient voiced understanding and assent.)         Anesthesia Quick Evaluation  "

## 2024-08-28 LAB — SURGICAL PATHOLOGY

## 2024-08-31 ENCOUNTER — Ambulatory Visit: Payer: Self-pay | Admitting: Gastroenterology

## 2024-09-09 ENCOUNTER — Ambulatory Visit: Payer: Self-pay

## 2024-09-09 NOTE — Telephone Encounter (Signed)
 Next Appt With Family Medicine Michail Schroeder, MD) 09/10/2024 at 11:00 AM

## 2024-09-09 NOTE — Telephone Encounter (Signed)
 noted

## 2024-09-09 NOTE — Progress Notes (Unsigned)
" ° ° ° °  Montez Stryker T. Fateh Kindle, MD, CAQ Sports Medicine [facility] [address] [place], [address]  Phone: [phone]  FAX: [phone]  Victoria Williamson - 59 y.o. female  MRN [id number]  Date of Birth: April 11, 1966  Date: 09/10/2024  PCP: [name], NP  Referral: [name], NP  No chief complaint on file.  Subjective:   Victoria Williamson is a 59 y.o. very pleasant female patient with There is no height or weight on file to calculate BMI. who presents with the following:  Discussed the use of AI scribe software for clinical note transcription with the patient, who gave verbal consent to proceed.  Patient presents with left flank and arm pain. History of Present Illness     Review of Systems is noted in the HPI, as appropriate  Objective:   There were no vitals taken for this visit.  GEN: No acute distress; alert,appropriate. PULM: Breathing comfortably in no respiratory distress PSYCH: Normally interactive.   Laboratory and Imaging Data:  Assessment and Plan:   No diagnosis found. Assessment & Plan   Medication Management during today's office visit: No orders of the defined types were placed in this encounter.  There are no discontinued medications.  Orders placed today for conditions managed today: No orders of the defined types were placed in this encounter.   Disposition: No follow-ups on file.  [employer] speech-to-text software was used for transcription in this dictation.  Possible transcriptional errors can occur using Animal nutritionist.   Signed,  Jacques DASEN. Mikey Maffett, MD   Outpatient Encounter Medications as of 09/10/2024  Medication Sig   apixaban  (ELIQUIS ) 5 MG TABS tablet Take 1 tablet (5 mg total) by mouth 2 (two) times daily.   ARIPiprazole  (ABILIFY ) 5 MG tablet Take 5 mg by mouth daily.   clobetasol  ointment (TEMOVATE ) 0.05 % Apply to affected area every night for 4 weeks, then every  other day for 4 weeks and then twice a week for 4 weeks or until resolution.   fluticasone  (FLONASE ) 50 MCG/ACT nasal spray Place 1 spray into both nostrils daily as needed for allergies.   loratadine  (CLARITIN ) 10 MG tablet Take 10 mg by mouth daily as needed for allergies.   metoprolol  succinate (TOPROL -XL) 100 MG 24 hr tablet Take 1 tablet (100 mg total) by mouth daily. Take with or immediately following a meal.   mirtazapine  (REMERON ) 15 MG tablet Take 1 tablet (15 mg total) by mouth at bedtime.   pantoprazole  (PROTONIX ) 40 MG tablet Take 1 tablet (40 mg total) by mouth daily.   Facility-Administered Encounter Medications as of 09/10/2024  Medication   tirzepatide  (MOUNJARO ) Pen 2.5 mg   tirzepatide  (MOUNJARO ) Pen 5 mg   "

## 2024-09-09 NOTE — Telephone Encounter (Signed)
 FYI Only or Action Required?: FYI only for provider: appointment scheduled on 2/5.  Patient was last seen in primary care on 06/17/2024 by [name], NP.  Called Nurse Triage reporting Flank Pain and Arm Pain.  Symptoms began a week ago.  Interventions attempted: OTC medications: Extra strength tylenol .  Symptoms are: gradually worsening.  Triage Disposition: See Physician Within 24 Hours  Patient/caregiver understands and will follow disposition?: Yes    8-9/10 aching pain in right arm from fingers to elbow only when using arm for several months. No pain at rest. No recent strain or injury. No CP. No SOB. No redness or swelling. Sore to the touch. Middle finger looks crooked. Hand feels tight.  6-7/10 burning aching pain left mid back pain x1 week, only hurts when laying down. No recent strain or injury to back. No numbness or weakness. No fever. No pain/burning with urination. No bloody or cloudy urine. No urinary frequency.   Scheduled appt with different provider at home office tomorrow d/t no PCP availability within timeframe.      Message from [place] G sent at 09/09/2024 10:15 AM EST  Reason for Triage: Pt has pain in her hand, down her arm, middle finger on the right hand is swollen (looks crooked), and one side of lower back. She states back only bother her when she lay down at night.   Reason for Disposition  MODERATE pain (e.g., interferes with normal activities or awakens from sleep)  Arm pain is a chronic symptom (recurrent or ongoing AND present > 4 weeks)  Answer Assessment - Initial Assessment Questions 1. LOCATION: Where does it hurt? (e.g., left, right)     Left flank  2. ONSET: When did the pain start?     1 week ago  3. SEVERITY: How bad is the pain? (e.g., Scale 1-10; mild, moderate, or severe)     6-7/10 burning aching pain  4. PATTERN: Does the pain come and go, or is it constant?      Only when laying down  5. CAUSE: What do you  think is causing the pain?     Unsure. Maybe her kidney  6. OTHER SYMPTOMS:  Do you have any other symptoms? (e.g., fever, abdomen pain, vomiting, leg weakness, burning with urination, blood in urine)     No  Answer Assessment - Initial Assessment Questions 1. ONSET: When did the pain start?     Several months ago  2. LOCATION: Where is the pain located?     Right arm from fingers to elbow  3. PAIN: How bad is the pain? (Scale 0-10; or none, mild, moderate, severe)     8-9/10 aching pain, only when using arm  4. WORK OR EXERCISE: Has there been any recent work or exercise that involved this part of the body?     No  5. CAUSE: What do you think is causing the arm pain?     Unsure, maybe arthritis  6. OTHER SYMPTOMS: Do you have any other symptoms? (e.g., neck pain, swelling, rash, fever, numbness, weakness)     Middle finger looks crooked. Hand feels tight.  Protocols used: Flank Pain-A-AH, Arm Pain-A-AH

## 2024-09-10 ENCOUNTER — Encounter: Payer: Self-pay | Admitting: Family Medicine

## 2024-09-10 ENCOUNTER — Ambulatory Visit: Admitting: Family Medicine

## 2024-09-10 VITALS — BP 120/72 | HR 62 | Temp 97.5°F | Ht 64.0 in | Wt 246.5 lb

## 2024-09-10 DIAGNOSIS — R10A2 Flank pain, left side: Secondary | ICD-10-CM

## 2024-09-10 DIAGNOSIS — M7918 Myalgia, other site: Secondary | ICD-10-CM

## 2024-09-10 DIAGNOSIS — M79602 Pain in left arm: Secondary | ICD-10-CM

## 2024-09-10 DIAGNOSIS — G8929 Other chronic pain: Secondary | ICD-10-CM

## 2024-09-10 DIAGNOSIS — M51372 Other intervertebral disc degeneration, lumbosacral region with discogenic back pain and lower extremity pain: Secondary | ICD-10-CM

## 2024-09-10 MED ORDER — PREGABALIN 75 MG PO CAPS: 75.0000 mg | ORAL_CAPSULE | Freq: Two times a day (BID) | ORAL | 2 refills | Status: AC

## 2024-09-10 MED ORDER — PREDNISONE 20 MG PO TABS: ORAL_TABLET | ORAL | 0 refills | Status: AC

## 2024-09-11 ENCOUNTER — Encounter: Payer: Self-pay | Admitting: Nurse Practitioner

## 2024-10-08 ENCOUNTER — Ambulatory Visit: Admitting: Cardiology

## 2024-10-13 ENCOUNTER — Ambulatory Visit

## 2025-06-18 ENCOUNTER — Encounter: Admitting: Nurse Practitioner
